# Patient Record
Sex: Female | Born: 1965
Health system: Southern US, Community
[De-identification: ages and names within clinical notes are randomized; demographics above are authoritative.]

## PROBLEM LIST (undated history)

## (undated) DIAGNOSIS — F319 Bipolar disorder, unspecified: Secondary | ICD-10-CM

## (undated) DIAGNOSIS — K219 Gastro-esophageal reflux disease without esophagitis: Secondary | ICD-10-CM

## (undated) DIAGNOSIS — I10 Essential (primary) hypertension: Secondary | ICD-10-CM

## (undated) DIAGNOSIS — F419 Anxiety disorder, unspecified: Secondary | ICD-10-CM

## (undated) DIAGNOSIS — J449 Chronic obstructive pulmonary disease, unspecified: Secondary | ICD-10-CM

---

## 2002-09-11 ENCOUNTER — Inpatient Hospital Stay (HOSPITAL_COMMUNITY): Admission: EM | Admit: 2002-09-11 | Discharge: 2002-09-14 | Payer: Self-pay | Admitting: Psychiatry

## 2015-09-01 ENCOUNTER — Institutional Professional Consult (permissible substitution): Payer: Self-pay | Admitting: Internal Medicine

## 2015-10-15 ENCOUNTER — Institutional Professional Consult (permissible substitution): Payer: Self-pay | Admitting: Internal Medicine

## 2015-10-17 ENCOUNTER — Institutional Professional Consult (permissible substitution): Payer: Self-pay | Admitting: Internal Medicine

## 2016-06-18 DIAGNOSIS — J181 Lobar pneumonia, unspecified organism: Secondary | ICD-10-CM | POA: Diagnosis not present

## 2016-06-18 DIAGNOSIS — J9621 Acute and chronic respiratory failure with hypoxia: Secondary | ICD-10-CM

## 2016-06-18 DIAGNOSIS — M3130 Wegener's granulomatosis without renal involvement: Secondary | ICD-10-CM

## 2016-06-18 DIAGNOSIS — K219 Gastro-esophageal reflux disease without esophagitis: Secondary | ICD-10-CM

## 2016-06-18 DIAGNOSIS — F419 Anxiety disorder, unspecified: Secondary | ICD-10-CM

## 2016-06-18 DIAGNOSIS — I10 Essential (primary) hypertension: Secondary | ICD-10-CM

## 2016-06-19 DIAGNOSIS — J441 Chronic obstructive pulmonary disease with (acute) exacerbation: Secondary | ICD-10-CM

## 2016-06-19 DIAGNOSIS — I10 Essential (primary) hypertension: Secondary | ICD-10-CM | POA: Diagnosis not present

## 2016-06-19 DIAGNOSIS — F419 Anxiety disorder, unspecified: Secondary | ICD-10-CM | POA: Diagnosis not present

## 2016-06-19 DIAGNOSIS — J181 Lobar pneumonia, unspecified organism: Secondary | ICD-10-CM | POA: Diagnosis not present

## 2016-06-19 DIAGNOSIS — J9621 Acute and chronic respiratory failure with hypoxia: Secondary | ICD-10-CM | POA: Diagnosis not present

## 2016-06-20 DIAGNOSIS — F419 Anxiety disorder, unspecified: Secondary | ICD-10-CM

## 2016-06-20 DIAGNOSIS — F250 Schizoaffective disorder, bipolar type: Secondary | ICD-10-CM

## 2016-06-20 DIAGNOSIS — J9621 Acute and chronic respiratory failure with hypoxia: Secondary | ICD-10-CM | POA: Diagnosis not present

## 2016-06-20 DIAGNOSIS — J441 Chronic obstructive pulmonary disease with (acute) exacerbation: Secondary | ICD-10-CM

## 2016-06-20 DIAGNOSIS — K219 Gastro-esophageal reflux disease without esophagitis: Secondary | ICD-10-CM

## 2016-06-20 DIAGNOSIS — I10 Essential (primary) hypertension: Secondary | ICD-10-CM

## 2016-06-20 DIAGNOSIS — M3130 Wegener's granulomatosis without renal involvement: Secondary | ICD-10-CM

## 2016-06-20 DIAGNOSIS — F29 Unspecified psychosis not due to a substance or known physiological condition: Secondary | ICD-10-CM

## 2016-06-20 DIAGNOSIS — J9692 Respiratory failure, unspecified with hypercapnia: Secondary | ICD-10-CM | POA: Diagnosis not present

## 2016-06-20 DIAGNOSIS — J159 Unspecified bacterial pneumonia: Secondary | ICD-10-CM

## 2016-06-21 DIAGNOSIS — J159 Unspecified bacterial pneumonia: Secondary | ICD-10-CM | POA: Diagnosis not present

## 2016-06-21 DIAGNOSIS — J441 Chronic obstructive pulmonary disease with (acute) exacerbation: Secondary | ICD-10-CM | POA: Diagnosis not present

## 2016-06-21 DIAGNOSIS — J9692 Respiratory failure, unspecified with hypercapnia: Secondary | ICD-10-CM | POA: Diagnosis not present

## 2016-06-21 DIAGNOSIS — J9621 Acute and chronic respiratory failure with hypoxia: Secondary | ICD-10-CM | POA: Diagnosis not present

## 2016-06-22 DIAGNOSIS — J441 Chronic obstructive pulmonary disease with (acute) exacerbation: Secondary | ICD-10-CM | POA: Diagnosis not present

## 2016-06-22 DIAGNOSIS — J159 Unspecified bacterial pneumonia: Secondary | ICD-10-CM | POA: Diagnosis not present

## 2016-06-22 DIAGNOSIS — J9621 Acute and chronic respiratory failure with hypoxia: Secondary | ICD-10-CM | POA: Diagnosis not present

## 2016-06-22 DIAGNOSIS — J9692 Respiratory failure, unspecified with hypercapnia: Secondary | ICD-10-CM | POA: Diagnosis not present

## 2016-06-23 DIAGNOSIS — J441 Chronic obstructive pulmonary disease with (acute) exacerbation: Secondary | ICD-10-CM | POA: Diagnosis not present

## 2016-06-23 DIAGNOSIS — J9692 Respiratory failure, unspecified with hypercapnia: Secondary | ICD-10-CM | POA: Diagnosis not present

## 2016-06-23 DIAGNOSIS — J9621 Acute and chronic respiratory failure with hypoxia: Secondary | ICD-10-CM | POA: Diagnosis not present

## 2016-06-23 DIAGNOSIS — J159 Unspecified bacterial pneumonia: Secondary | ICD-10-CM | POA: Diagnosis not present

## 2016-06-24 DIAGNOSIS — J441 Chronic obstructive pulmonary disease with (acute) exacerbation: Secondary | ICD-10-CM | POA: Diagnosis not present

## 2016-06-24 DIAGNOSIS — J9621 Acute and chronic respiratory failure with hypoxia: Secondary | ICD-10-CM | POA: Diagnosis not present

## 2016-06-24 DIAGNOSIS — J159 Unspecified bacterial pneumonia: Secondary | ICD-10-CM | POA: Diagnosis not present

## 2016-06-24 DIAGNOSIS — J9692 Respiratory failure, unspecified with hypercapnia: Secondary | ICD-10-CM | POA: Diagnosis not present

## 2016-06-25 DIAGNOSIS — J159 Unspecified bacterial pneumonia: Secondary | ICD-10-CM | POA: Diagnosis not present

## 2016-06-25 DIAGNOSIS — J441 Chronic obstructive pulmonary disease with (acute) exacerbation: Secondary | ICD-10-CM | POA: Diagnosis not present

## 2016-06-25 DIAGNOSIS — J9692 Respiratory failure, unspecified with hypercapnia: Secondary | ICD-10-CM | POA: Diagnosis not present

## 2016-06-25 DIAGNOSIS — J9621 Acute and chronic respiratory failure with hypoxia: Secondary | ICD-10-CM | POA: Diagnosis not present

## 2016-06-26 DIAGNOSIS — J9692 Respiratory failure, unspecified with hypercapnia: Secondary | ICD-10-CM | POA: Diagnosis not present

## 2016-06-26 DIAGNOSIS — J159 Unspecified bacterial pneumonia: Secondary | ICD-10-CM | POA: Diagnosis not present

## 2016-06-26 DIAGNOSIS — J441 Chronic obstructive pulmonary disease with (acute) exacerbation: Secondary | ICD-10-CM | POA: Diagnosis not present

## 2016-06-26 DIAGNOSIS — J9621 Acute and chronic respiratory failure with hypoxia: Secondary | ICD-10-CM | POA: Diagnosis not present

## 2016-07-16 DIAGNOSIS — J441 Chronic obstructive pulmonary disease with (acute) exacerbation: Secondary | ICD-10-CM | POA: Diagnosis not present

## 2016-07-16 DIAGNOSIS — J189 Pneumonia, unspecified organism: Secondary | ICD-10-CM

## 2016-07-16 DIAGNOSIS — E871 Hypo-osmolality and hyponatremia: Secondary | ICD-10-CM | POA: Diagnosis not present

## 2016-07-16 DIAGNOSIS — I10 Essential (primary) hypertension: Secondary | ICD-10-CM

## 2016-07-16 DIAGNOSIS — J9620 Acute and chronic respiratory failure, unspecified whether with hypoxia or hypercapnia: Secondary | ICD-10-CM

## 2016-07-16 DIAGNOSIS — Z720 Tobacco use: Secondary | ICD-10-CM

## 2016-07-17 DIAGNOSIS — J189 Pneumonia, unspecified organism: Secondary | ICD-10-CM | POA: Diagnosis not present

## 2016-07-17 DIAGNOSIS — J441 Chronic obstructive pulmonary disease with (acute) exacerbation: Secondary | ICD-10-CM | POA: Diagnosis not present

## 2016-07-17 DIAGNOSIS — E871 Hypo-osmolality and hyponatremia: Secondary | ICD-10-CM | POA: Diagnosis not present

## 2016-07-17 DIAGNOSIS — J9620 Acute and chronic respiratory failure, unspecified whether with hypoxia or hypercapnia: Secondary | ICD-10-CM | POA: Diagnosis not present

## 2016-07-18 DIAGNOSIS — J189 Pneumonia, unspecified organism: Secondary | ICD-10-CM | POA: Diagnosis not present

## 2016-07-18 DIAGNOSIS — E871 Hypo-osmolality and hyponatremia: Secondary | ICD-10-CM | POA: Diagnosis not present

## 2016-07-18 DIAGNOSIS — J9620 Acute and chronic respiratory failure, unspecified whether with hypoxia or hypercapnia: Secondary | ICD-10-CM | POA: Diagnosis not present

## 2016-07-18 DIAGNOSIS — J441 Chronic obstructive pulmonary disease with (acute) exacerbation: Secondary | ICD-10-CM | POA: Diagnosis not present

## 2016-07-19 DIAGNOSIS — J189 Pneumonia, unspecified organism: Secondary | ICD-10-CM | POA: Diagnosis not present

## 2016-07-19 DIAGNOSIS — E871 Hypo-osmolality and hyponatremia: Secondary | ICD-10-CM | POA: Diagnosis not present

## 2016-07-19 DIAGNOSIS — J441 Chronic obstructive pulmonary disease with (acute) exacerbation: Secondary | ICD-10-CM | POA: Diagnosis not present

## 2016-07-19 DIAGNOSIS — J9620 Acute and chronic respiratory failure, unspecified whether with hypoxia or hypercapnia: Secondary | ICD-10-CM | POA: Diagnosis not present

## 2016-07-20 DIAGNOSIS — J441 Chronic obstructive pulmonary disease with (acute) exacerbation: Secondary | ICD-10-CM | POA: Diagnosis not present

## 2016-07-20 DIAGNOSIS — J189 Pneumonia, unspecified organism: Secondary | ICD-10-CM | POA: Diagnosis not present

## 2016-07-20 DIAGNOSIS — E871 Hypo-osmolality and hyponatremia: Secondary | ICD-10-CM | POA: Diagnosis not present

## 2016-07-20 DIAGNOSIS — J9620 Acute and chronic respiratory failure, unspecified whether with hypoxia or hypercapnia: Secondary | ICD-10-CM | POA: Diagnosis not present

## 2016-07-21 DIAGNOSIS — J9620 Acute and chronic respiratory failure, unspecified whether with hypoxia or hypercapnia: Secondary | ICD-10-CM | POA: Diagnosis not present

## 2016-07-21 DIAGNOSIS — I10 Essential (primary) hypertension: Secondary | ICD-10-CM

## 2016-07-21 DIAGNOSIS — J441 Chronic obstructive pulmonary disease with (acute) exacerbation: Secondary | ICD-10-CM | POA: Diagnosis not present

## 2016-07-21 DIAGNOSIS — J189 Pneumonia, unspecified organism: Secondary | ICD-10-CM

## 2016-07-21 DIAGNOSIS — Z720 Tobacco use: Secondary | ICD-10-CM

## 2016-07-21 DIAGNOSIS — E871 Hypo-osmolality and hyponatremia: Secondary | ICD-10-CM

## 2016-07-22 DIAGNOSIS — J9620 Acute and chronic respiratory failure, unspecified whether with hypoxia or hypercapnia: Secondary | ICD-10-CM | POA: Diagnosis not present

## 2016-07-22 DIAGNOSIS — J441 Chronic obstructive pulmonary disease with (acute) exacerbation: Secondary | ICD-10-CM | POA: Diagnosis not present

## 2016-07-22 DIAGNOSIS — E871 Hypo-osmolality and hyponatremia: Secondary | ICD-10-CM | POA: Diagnosis not present

## 2016-07-22 DIAGNOSIS — J189 Pneumonia, unspecified organism: Secondary | ICD-10-CM | POA: Diagnosis not present

## 2016-07-23 DIAGNOSIS — J9620 Acute and chronic respiratory failure, unspecified whether with hypoxia or hypercapnia: Secondary | ICD-10-CM | POA: Diagnosis not present

## 2016-07-23 DIAGNOSIS — E871 Hypo-osmolality and hyponatremia: Secondary | ICD-10-CM | POA: Diagnosis not present

## 2016-07-23 DIAGNOSIS — J189 Pneumonia, unspecified organism: Secondary | ICD-10-CM | POA: Diagnosis not present

## 2016-07-23 DIAGNOSIS — J441 Chronic obstructive pulmonary disease with (acute) exacerbation: Secondary | ICD-10-CM | POA: Diagnosis not present

## 2016-07-24 DIAGNOSIS — J9620 Acute and chronic respiratory failure, unspecified whether with hypoxia or hypercapnia: Secondary | ICD-10-CM | POA: Diagnosis not present

## 2016-07-24 DIAGNOSIS — J441 Chronic obstructive pulmonary disease with (acute) exacerbation: Secondary | ICD-10-CM | POA: Diagnosis not present

## 2016-07-24 DIAGNOSIS — E871 Hypo-osmolality and hyponatremia: Secondary | ICD-10-CM | POA: Diagnosis not present

## 2016-07-24 DIAGNOSIS — J189 Pneumonia, unspecified organism: Secondary | ICD-10-CM | POA: Diagnosis not present

## 2016-08-09 DIAGNOSIS — J441 Chronic obstructive pulmonary disease with (acute) exacerbation: Secondary | ICD-10-CM

## 2016-08-09 DIAGNOSIS — M3130 Wegener's granulomatosis without renal involvement: Secondary | ICD-10-CM

## 2016-08-09 DIAGNOSIS — F419 Anxiety disorder, unspecified: Secondary | ICD-10-CM

## 2016-08-09 DIAGNOSIS — E871 Hypo-osmolality and hyponatremia: Secondary | ICD-10-CM

## 2016-08-09 DIAGNOSIS — I160 Hypertensive urgency: Secondary | ICD-10-CM

## 2016-08-09 DIAGNOSIS — Z720 Tobacco use: Secondary | ICD-10-CM

## 2016-08-09 DIAGNOSIS — J9620 Acute and chronic respiratory failure, unspecified whether with hypoxia or hypercapnia: Secondary | ICD-10-CM

## 2016-08-10 DIAGNOSIS — J9620 Acute and chronic respiratory failure, unspecified whether with hypoxia or hypercapnia: Secondary | ICD-10-CM | POA: Diagnosis not present

## 2016-08-10 DIAGNOSIS — I160 Hypertensive urgency: Secondary | ICD-10-CM | POA: Diagnosis not present

## 2016-08-10 DIAGNOSIS — E871 Hypo-osmolality and hyponatremia: Secondary | ICD-10-CM | POA: Diagnosis not present

## 2016-08-10 DIAGNOSIS — J441 Chronic obstructive pulmonary disease with (acute) exacerbation: Secondary | ICD-10-CM | POA: Diagnosis not present

## 2016-08-11 DIAGNOSIS — J441 Chronic obstructive pulmonary disease with (acute) exacerbation: Secondary | ICD-10-CM

## 2016-08-11 DIAGNOSIS — E871 Hypo-osmolality and hyponatremia: Secondary | ICD-10-CM | POA: Diagnosis not present

## 2016-08-11 DIAGNOSIS — J9621 Acute and chronic respiratory failure with hypoxia: Secondary | ICD-10-CM

## 2016-08-11 DIAGNOSIS — F250 Schizoaffective disorder, bipolar type: Secondary | ICD-10-CM

## 2016-08-11 DIAGNOSIS — Z720 Tobacco use: Secondary | ICD-10-CM

## 2016-08-11 DIAGNOSIS — J9612 Chronic respiratory failure with hypercapnia: Secondary | ICD-10-CM | POA: Diagnosis not present

## 2016-08-11 DIAGNOSIS — G40909 Epilepsy, unspecified, not intractable, without status epilepticus: Secondary | ICD-10-CM | POA: Diagnosis not present

## 2016-08-11 DIAGNOSIS — K219 Gastro-esophageal reflux disease without esophagitis: Secondary | ICD-10-CM

## 2016-08-11 DIAGNOSIS — I160 Hypertensive urgency: Secondary | ICD-10-CM

## 2016-08-11 DIAGNOSIS — E872 Acidosis: Secondary | ICD-10-CM

## 2016-08-11 DIAGNOSIS — M549 Dorsalgia, unspecified: Secondary | ICD-10-CM

## 2016-08-12 DIAGNOSIS — J9612 Chronic respiratory failure with hypercapnia: Secondary | ICD-10-CM | POA: Diagnosis not present

## 2016-08-12 DIAGNOSIS — E872 Acidosis: Secondary | ICD-10-CM | POA: Diagnosis not present

## 2016-08-12 DIAGNOSIS — G40909 Epilepsy, unspecified, not intractable, without status epilepticus: Secondary | ICD-10-CM | POA: Diagnosis not present

## 2016-08-12 DIAGNOSIS — E871 Hypo-osmolality and hyponatremia: Secondary | ICD-10-CM | POA: Diagnosis not present

## 2016-08-13 DIAGNOSIS — G40909 Epilepsy, unspecified, not intractable, without status epilepticus: Secondary | ICD-10-CM | POA: Diagnosis not present

## 2016-08-13 DIAGNOSIS — J9612 Chronic respiratory failure with hypercapnia: Secondary | ICD-10-CM | POA: Diagnosis not present

## 2016-08-13 DIAGNOSIS — E871 Hypo-osmolality and hyponatremia: Secondary | ICD-10-CM | POA: Diagnosis not present

## 2016-08-13 DIAGNOSIS — E872 Acidosis: Secondary | ICD-10-CM | POA: Diagnosis not present

## 2016-08-22 DIAGNOSIS — F419 Anxiety disorder, unspecified: Secondary | ICD-10-CM

## 2016-08-22 DIAGNOSIS — R4182 Altered mental status, unspecified: Secondary | ICD-10-CM

## 2016-08-22 DIAGNOSIS — J9602 Acute respiratory failure with hypercapnia: Secondary | ICD-10-CM

## 2016-08-22 DIAGNOSIS — J441 Chronic obstructive pulmonary disease with (acute) exacerbation: Secondary | ICD-10-CM | POA: Diagnosis not present

## 2016-08-22 DIAGNOSIS — J9620 Acute and chronic respiratory failure, unspecified whether with hypoxia or hypercapnia: Secondary | ICD-10-CM

## 2016-08-22 DIAGNOSIS — I10 Essential (primary) hypertension: Secondary | ICD-10-CM

## 2016-08-23 DIAGNOSIS — J441 Chronic obstructive pulmonary disease with (acute) exacerbation: Secondary | ICD-10-CM | POA: Diagnosis not present

## 2016-08-23 DIAGNOSIS — J9602 Acute respiratory failure with hypercapnia: Secondary | ICD-10-CM | POA: Diagnosis not present

## 2016-08-23 DIAGNOSIS — R4182 Altered mental status, unspecified: Secondary | ICD-10-CM | POA: Diagnosis not present

## 2016-08-23 DIAGNOSIS — J9620 Acute and chronic respiratory failure, unspecified whether with hypoxia or hypercapnia: Secondary | ICD-10-CM | POA: Diagnosis not present

## 2016-08-24 DIAGNOSIS — R4182 Altered mental status, unspecified: Secondary | ICD-10-CM

## 2016-08-24 DIAGNOSIS — I10 Essential (primary) hypertension: Secondary | ICD-10-CM

## 2016-08-24 DIAGNOSIS — J9620 Acute and chronic respiratory failure, unspecified whether with hypoxia or hypercapnia: Secondary | ICD-10-CM | POA: Diagnosis not present

## 2016-08-24 DIAGNOSIS — F419 Anxiety disorder, unspecified: Secondary | ICD-10-CM

## 2016-08-24 DIAGNOSIS — J9602 Acute respiratory failure with hypercapnia: Secondary | ICD-10-CM

## 2016-08-24 DIAGNOSIS — J441 Chronic obstructive pulmonary disease with (acute) exacerbation: Secondary | ICD-10-CM

## 2016-08-25 DIAGNOSIS — J441 Chronic obstructive pulmonary disease with (acute) exacerbation: Secondary | ICD-10-CM | POA: Diagnosis not present

## 2016-08-25 DIAGNOSIS — J9620 Acute and chronic respiratory failure, unspecified whether with hypoxia or hypercapnia: Secondary | ICD-10-CM | POA: Diagnosis not present

## 2016-08-25 DIAGNOSIS — J9602 Acute respiratory failure with hypercapnia: Secondary | ICD-10-CM | POA: Diagnosis not present

## 2016-08-25 DIAGNOSIS — R4182 Altered mental status, unspecified: Secondary | ICD-10-CM | POA: Diagnosis not present

## 2016-08-26 ENCOUNTER — Inpatient Hospital Stay (HOSPITAL_COMMUNITY): Payer: Medicare HMO

## 2016-08-26 ENCOUNTER — Inpatient Hospital Stay (HOSPITAL_COMMUNITY)
Admission: AD | Admit: 2016-08-26 | Discharge: 2016-09-09 | DRG: 166 | Disposition: A | Discharge status: Rehabilitation Facility | Payer: Medicare HMO | Source: Other Acute Inpatient Hospital | Attending: Family Medicine | Admitting: Family Medicine

## 2016-08-26 ENCOUNTER — Encounter (HOSPITAL_COMMUNITY): Payer: Self-pay | Admitting: Student

## 2016-08-26 DIAGNOSIS — F319 Bipolar disorder, unspecified: Secondary | ICD-10-CM | POA: Diagnosis present

## 2016-08-26 DIAGNOSIS — J9600 Acute respiratory failure, unspecified whether with hypoxia or hypercapnia: Secondary | ICD-10-CM | POA: Diagnosis not present

## 2016-08-26 DIAGNOSIS — Z6835 Body mass index (BMI) 35.0-35.9, adult: Secondary | ICD-10-CM | POA: Diagnosis not present

## 2016-08-26 DIAGNOSIS — F4310 Post-traumatic stress disorder, unspecified: Secondary | ICD-10-CM | POA: Diagnosis present

## 2016-08-26 DIAGNOSIS — G7281 Critical illness myopathy: Secondary | ICD-10-CM | POA: Diagnosis present

## 2016-08-26 DIAGNOSIS — D649 Anemia, unspecified: Secondary | ICD-10-CM | POA: Diagnosis not present

## 2016-08-26 DIAGNOSIS — Z791 Long term (current) use of non-steroidal anti-inflammatories (NSAID): Secondary | ICD-10-CM | POA: Diagnosis not present

## 2016-08-26 DIAGNOSIS — E1169 Type 2 diabetes mellitus with other specified complication: Secondary | ICD-10-CM | POA: Diagnosis not present

## 2016-08-26 DIAGNOSIS — Z888 Allergy status to other drugs, medicaments and biological substances status: Secondary | ICD-10-CM

## 2016-08-26 DIAGNOSIS — D62 Acute posthemorrhagic anemia: Secondary | ICD-10-CM | POA: Diagnosis not present

## 2016-08-26 DIAGNOSIS — R0600 Dyspnea, unspecified: Secondary | ICD-10-CM

## 2016-08-26 DIAGNOSIS — J129 Viral pneumonia, unspecified: Secondary | ICD-10-CM | POA: Diagnosis present

## 2016-08-26 DIAGNOSIS — J440 Chronic obstructive pulmonary disease with acute lower respiratory infection: Secondary | ICD-10-CM | POA: Diagnosis present

## 2016-08-26 DIAGNOSIS — R32 Unspecified urinary incontinence: Secondary | ICD-10-CM | POA: Diagnosis not present

## 2016-08-26 DIAGNOSIS — J9620 Acute and chronic respiratory failure, unspecified whether with hypoxia or hypercapnia: Secondary | ICD-10-CM | POA: Diagnosis not present

## 2016-08-26 DIAGNOSIS — Z880 Allergy status to penicillin: Secondary | ICD-10-CM

## 2016-08-26 DIAGNOSIS — Z79891 Long term (current) use of opiate analgesic: Secondary | ICD-10-CM

## 2016-08-26 DIAGNOSIS — J189 Pneumonia, unspecified organism: Secondary | ICD-10-CM

## 2016-08-26 DIAGNOSIS — K219 Gastro-esophageal reflux disease without esophagitis: Secondary | ICD-10-CM | POA: Diagnosis not present

## 2016-08-26 DIAGNOSIS — Z4659 Encounter for fitting and adjustment of other gastrointestinal appliance and device: Secondary | ICD-10-CM | POA: Diagnosis not present

## 2016-08-26 DIAGNOSIS — E119 Type 2 diabetes mellitus without complications: Secondary | ICD-10-CM

## 2016-08-26 DIAGNOSIS — F3112 Bipolar disorder, current episode manic without psychotic features, moderate: Secondary | ICD-10-CM | POA: Diagnosis not present

## 2016-08-26 DIAGNOSIS — R5381 Other malaise: Secondary | ICD-10-CM | POA: Diagnosis not present

## 2016-08-26 DIAGNOSIS — I5033 Acute on chronic diastolic (congestive) heart failure: Secondary | ICD-10-CM | POA: Diagnosis not present

## 2016-08-26 DIAGNOSIS — K5903 Drug induced constipation: Secondary | ICD-10-CM | POA: Diagnosis not present

## 2016-08-26 DIAGNOSIS — I110 Hypertensive heart disease with heart failure: Secondary | ICD-10-CM | POA: Diagnosis present

## 2016-08-26 DIAGNOSIS — R0602 Shortness of breath: Secondary | ICD-10-CM

## 2016-08-26 DIAGNOSIS — E876 Hypokalemia: Secondary | ICD-10-CM | POA: Diagnosis not present

## 2016-08-26 DIAGNOSIS — I10 Essential (primary) hypertension: Secondary | ICD-10-CM | POA: Diagnosis not present

## 2016-08-26 DIAGNOSIS — Z881 Allergy status to other antibiotic agents status: Secondary | ICD-10-CM

## 2016-08-26 DIAGNOSIS — M549 Dorsalgia, unspecified: Secondary | ICD-10-CM | POA: Diagnosis present

## 2016-08-26 DIAGNOSIS — E1142 Type 2 diabetes mellitus with diabetic polyneuropathy: Secondary | ICD-10-CM | POA: Diagnosis not present

## 2016-08-26 DIAGNOSIS — Z9911 Dependence on respirator [ventilator] status: Secondary | ICD-10-CM

## 2016-08-26 DIAGNOSIS — I5030 Unspecified diastolic (congestive) heart failure: Secondary | ICD-10-CM | POA: Diagnosis present

## 2016-08-26 DIAGNOSIS — E1165 Type 2 diabetes mellitus with hyperglycemia: Secondary | ICD-10-CM | POA: Diagnosis present

## 2016-08-26 DIAGNOSIS — J810 Acute pulmonary edema: Secondary | ICD-10-CM | POA: Diagnosis not present

## 2016-08-26 DIAGNOSIS — J9601 Acute respiratory failure with hypoxia: Secondary | ICD-10-CM | POA: Diagnosis not present

## 2016-08-26 DIAGNOSIS — Z885 Allergy status to narcotic agent status: Secondary | ICD-10-CM

## 2016-08-26 DIAGNOSIS — J441 Chronic obstructive pulmonary disease with (acute) exacerbation: Secondary | ICD-10-CM | POA: Diagnosis present

## 2016-08-26 DIAGNOSIS — Z7951 Long term (current) use of inhaled steroids: Secondary | ICD-10-CM

## 2016-08-26 DIAGNOSIS — R918 Other nonspecific abnormal finding of lung field: Secondary | ICD-10-CM | POA: Diagnosis not present

## 2016-08-26 DIAGNOSIS — R7309 Other abnormal glucose: Secondary | ICD-10-CM | POA: Diagnosis not present

## 2016-08-26 DIAGNOSIS — F3170 Bipolar disorder, currently in remission, most recent episode unspecified: Secondary | ICD-10-CM | POA: Diagnosis not present

## 2016-08-26 DIAGNOSIS — D71 Functional disorders of polymorphonuclear neutrophils: Secondary | ICD-10-CM | POA: Diagnosis present

## 2016-08-26 DIAGNOSIS — E662 Morbid (severe) obesity with alveolar hypoventilation: Secondary | ICD-10-CM | POA: Diagnosis not present

## 2016-08-26 DIAGNOSIS — J449 Chronic obstructive pulmonary disease, unspecified: Secondary | ICD-10-CM | POA: Diagnosis not present

## 2016-08-26 DIAGNOSIS — J9621 Acute and chronic respiratory failure with hypoxia: Secondary | ICD-10-CM | POA: Diagnosis present

## 2016-08-26 DIAGNOSIS — B49 Unspecified mycosis: Secondary | ICD-10-CM | POA: Diagnosis not present

## 2016-08-26 DIAGNOSIS — F419 Anxiety disorder, unspecified: Secondary | ICD-10-CM | POA: Diagnosis not present

## 2016-08-26 DIAGNOSIS — J9602 Acute respiratory failure with hypercapnia: Secondary | ICD-10-CM | POA: Diagnosis not present

## 2016-08-26 DIAGNOSIS — G4733 Obstructive sleep apnea (adult) (pediatric): Secondary | ICD-10-CM | POA: Diagnosis not present

## 2016-08-26 DIAGNOSIS — G8929 Other chronic pain: Secondary | ICD-10-CM | POA: Diagnosis not present

## 2016-08-26 DIAGNOSIS — Z978 Presence of other specified devices: Secondary | ICD-10-CM

## 2016-08-26 DIAGNOSIS — E871 Hypo-osmolality and hyponatremia: Secondary | ICD-10-CM | POA: Diagnosis not present

## 2016-08-26 DIAGNOSIS — J9622 Acute and chronic respiratory failure with hypercapnia: Secondary | ICD-10-CM | POA: Diagnosis not present

## 2016-08-26 DIAGNOSIS — R4182 Altered mental status, unspecified: Secondary | ICD-10-CM | POA: Diagnosis not present

## 2016-08-26 DIAGNOSIS — E669 Obesity, unspecified: Secondary | ICD-10-CM | POA: Diagnosis not present

## 2016-08-26 DIAGNOSIS — J181 Lobar pneumonia, unspecified organism: Secondary | ICD-10-CM | POA: Diagnosis not present

## 2016-08-26 DIAGNOSIS — R262 Difficulty in walking, not elsewhere classified: Secondary | ICD-10-CM | POA: Diagnosis not present

## 2016-08-26 HISTORY — DX: Gastro-esophageal reflux disease without esophagitis: K21.9

## 2016-08-26 HISTORY — DX: Chronic obstructive pulmonary disease, unspecified: J44.9

## 2016-08-26 HISTORY — DX: Bipolar disorder, unspecified: F31.9

## 2016-08-26 HISTORY — DX: Essential (primary) hypertension: I10

## 2016-08-26 HISTORY — DX: Anxiety disorder, unspecified: F41.9

## 2016-08-26 LAB — URINALYSIS, ROUTINE W REFLEX MICROSCOPIC
Bilirubin Urine: NEGATIVE
Glucose, UA: NEGATIVE mg/dL
Ketones, ur: 15 mg/dL — AB
NITRITE: NEGATIVE
PH: 6.5 (ref 5.0–8.0)
Protein, ur: 100 mg/dL — AB
SPECIFIC GRAVITY, URINE: 1.026 (ref 1.005–1.030)

## 2016-08-26 LAB — BLOOD GAS, ARTERIAL
Acid-Base Excess: 15.6 mmol/L — ABNORMAL HIGH (ref 0.0–2.0)
Bicarbonate: 41.1 mmol/L — ABNORMAL HIGH (ref 20.0–28.0)
DELIVERY SYSTEMS: POSITIVE
Drawn by: 418751
Expiratory PAP: 7
FIO2: 40
LHR: 10 {breaths}/min
MODE: POSITIVE
O2 Saturation: 96.4 %
PO2 ART: 87.4 mmHg (ref 83.0–108.0)
Patient temperature: 98.6
pCO2 arterial: 65.6 mmHg (ref 32.0–48.0)
pH, Arterial: 7.413 (ref 7.350–7.450)

## 2016-08-26 LAB — CBC WITH DIFFERENTIAL/PLATELET
BASOS PCT: 0 %
Basophils Absolute: 0 10*3/uL (ref 0.0–0.1)
Eosinophils Absolute: 0 10*3/uL (ref 0.0–0.7)
Eosinophils Relative: 0 %
HEMATOCRIT: 39.8 % (ref 36.0–46.0)
HEMOGLOBIN: 12.1 g/dL (ref 12.0–15.0)
LYMPHS PCT: 4 %
Lymphs Abs: 0.4 10*3/uL — ABNORMAL LOW (ref 0.7–4.0)
MCH: 25.2 pg — ABNORMAL LOW (ref 26.0–34.0)
MCHC: 30.4 g/dL (ref 30.0–36.0)
MCV: 82.9 fL (ref 78.0–100.0)
MONOS PCT: 5 %
Monocytes Absolute: 0.5 10*3/uL (ref 0.1–1.0)
NEUTROS ABS: 8.8 10*3/uL — AB (ref 1.7–7.7)
NEUTROS PCT: 91 %
Platelets: 236 10*3/uL (ref 150–400)
RBC: 4.8 MIL/uL (ref 3.87–5.11)
RDW: 15.5 % (ref 11.5–15.5)
WBC: 9.7 10*3/uL (ref 4.0–10.5)

## 2016-08-26 LAB — COMPREHENSIVE METABOLIC PANEL
ALK PHOS: 45 U/L (ref 38–126)
ALT: 14 U/L (ref 14–54)
ANION GAP: 14 (ref 5–15)
AST: 12 U/L — ABNORMAL LOW (ref 15–41)
Albumin: 3.5 g/dL (ref 3.5–5.0)
BILIRUBIN TOTAL: 0.9 mg/dL (ref 0.3–1.2)
BUN: 13 mg/dL (ref 6–20)
CALCIUM: 9.2 mg/dL (ref 8.9–10.3)
CO2: 36 mmol/L — ABNORMAL HIGH (ref 22–32)
Chloride: 90 mmol/L — ABNORMAL LOW (ref 101–111)
Creatinine, Ser: 0.42 mg/dL — ABNORMAL LOW (ref 0.44–1.00)
GLUCOSE: 184 mg/dL — AB (ref 65–99)
Potassium: 3.3 mmol/L — ABNORMAL LOW (ref 3.5–5.1)
Sodium: 140 mmol/L (ref 135–145)
TOTAL PROTEIN: 6.1 g/dL — AB (ref 6.5–8.1)

## 2016-08-26 LAB — TROPONIN I
Troponin I: 0.04 ng/mL (ref <0.03)
Troponin I: <0.03 ng/mL (ref <0.03)

## 2016-08-26 LAB — PHOSPHORUS: PHOSPHORUS: 3.9 mg/dL (ref 2.5–4.6)

## 2016-08-26 LAB — BRAIN NATRIURETIC PEPTIDE: B Natriuretic Peptide: 168.8 pg/mL — ABNORMAL HIGH (ref 0.0–100.0)

## 2016-08-26 LAB — LACTIC ACID, PLASMA
LACTIC ACID, VENOUS: 0.9 mmol/L (ref 0.5–1.9)
Lactic Acid, Venous: 0.8 mmol/L (ref 0.5–1.9)

## 2016-08-26 LAB — MRSA PCR SCREENING: MRSA BY PCR: NEGATIVE

## 2016-08-26 LAB — URINE MICROSCOPIC-ADD ON

## 2016-08-26 LAB — PROCALCITONIN

## 2016-08-26 LAB — VALPROIC ACID LEVEL: Valproic Acid Lvl: 75 ug/mL (ref 50.0–100.0)

## 2016-08-26 LAB — MAGNESIUM: MAGNESIUM: 2.3 mg/dL (ref 1.7–2.4)

## 2016-08-26 MED ORDER — LORAZEPAM 2 MG/ML IJ SOLN: 0.5000 mg | Freq: Three times a day (TID) | INTRAMUSCULAR | Status: DC | PRN

## 2016-08-26 MED ORDER — VALPROATE SODIUM 500 MG/5ML IV SOLN
500.0000 mg | Freq: Two times a day (BID) | INTRAVENOUS | Status: DC
  Administered 2016-08-26 – 2016-08-27 (×3): 500 mg via INTRAVENOUS
  Filled 2016-08-26 (×5): qty 5

## 2016-08-26 MED ORDER — SODIUM CHLORIDE 0.9 % IV SOLN
30.0000 meq | Freq: Two times a day (BID) | INTRAVENOUS | Status: DC
  Administered 2016-08-26: 30 meq via INTRAVENOUS
  Filled 2016-08-26 (×2): qty 15

## 2016-08-26 MED ORDER — ETOMIDATE 2 MG/ML IV SOLN
0.3000 mg/kg | Freq: Once | INTRAVENOUS | Status: AC
  Administered 2016-08-26: 20 mg via INTRAVENOUS

## 2016-08-26 MED ORDER — METHYLPREDNISOLONE SODIUM SUCC 125 MG IJ SOLR
60.0000 mg | Freq: Four times a day (QID) | INTRAMUSCULAR | Status: DC
  Administered 2016-08-26 – 2016-08-28 (×7): 60 mg via INTRAVENOUS
  Filled 2016-08-26: qty 0.96
  Filled 2016-08-26: qty 2
  Filled 2016-08-26 (×2): qty 0.96
  Filled 2016-08-26: qty 2
  Filled 2016-08-26 (×3): qty 0.96

## 2016-08-26 MED ORDER — FENTANYL CITRATE (PF) 100 MCG/2ML IJ SOLN: 100.0000 ug | Freq: Once | INTRAMUSCULAR | Status: DC

## 2016-08-26 MED ORDER — RISPERIDONE 2 MG PO TABS
2.0000 mg | ORAL_TABLET | Freq: Every day | ORAL | Status: DC
  Filled 2016-08-26: qty 1

## 2016-08-26 MED ORDER — ENOXAPARIN SODIUM 40 MG/0.4ML ~~LOC~~ SOLN
40.0000 mg | SUBCUTANEOUS | Status: DC
  Administered 2016-08-26 – 2016-09-05 (×10): 40 mg via SUBCUTANEOUS
  Filled 2016-08-26 (×11): qty 0.4

## 2016-08-26 MED ORDER — ORAL CARE MOUTH RINSE: 15.0000 mL | Freq: Two times a day (BID) | OROMUCOSAL | Status: DC

## 2016-08-26 MED ORDER — MIDAZOLAM HCL 2 MG/2ML IJ SOLN
INTRAMUSCULAR | Status: AC
  Administered 2016-08-26: 2 mg via INTRAVENOUS
  Filled 2016-08-26: qty 4

## 2016-08-26 MED ORDER — MORPHINE SULFATE (PF) 4 MG/ML IV SOLN
4.0000 mg | INTRAVENOUS | Status: AC
  Administered 2016-08-26: 4 mg via INTRAVENOUS
  Filled 2016-08-26: qty 1

## 2016-08-26 MED ORDER — ALBUTEROL SULFATE (2.5 MG/3ML) 0.083% IN NEBU
2.5000 mg | INHALATION_SOLUTION | RESPIRATORY_TRACT | Status: DC | PRN
Start: 2016-08-26 — End: 2016-09-09

## 2016-08-26 MED ORDER — SODIUM CHLORIDE 0.9 % IV SOLN
250.0000 mL | INTRAVENOUS | Status: DC | PRN
  Administered 2016-08-29: 250 mL via INTRAVENOUS

## 2016-08-26 MED ORDER — ACETAMINOPHEN 325 MG PO TABS
650.0000 mg | ORAL_TABLET | Freq: Four times a day (QID) | ORAL | Status: DC | PRN
  Filled 2016-08-26: qty 2

## 2016-08-26 MED ORDER — FENTANYL CITRATE (PF) 100 MCG/2ML IJ SOLN: 100.0000 ug | INTRAMUSCULAR | Status: DC | PRN

## 2016-08-26 MED ORDER — PROPOFOL 1000 MG/100ML IV EMUL
0.0000 ug/kg/min | INTRAVENOUS | Status: DC
  Administered 2016-08-27: 25 ug/kg/min via INTRAVENOUS
  Administered 2016-08-27 (×3): 40 ug/kg/min via INTRAVENOUS
  Administered 2016-08-27: 5 ug/kg/min via INTRAVENOUS
  Administered 2016-08-28 (×2): 50 ug/kg/min via INTRAVENOUS
  Administered 2016-08-28 (×2): 40 ug/kg/min via INTRAVENOUS
  Administered 2016-08-28 (×2): 50 ug/kg/min via INTRAVENOUS
  Administered 2016-08-29 (×2): 30 ug/kg/min via INTRAVENOUS
  Administered 2016-08-29: 40 ug/kg/min via INTRAVENOUS
  Administered 2016-08-29: 50 ug/kg/min via INTRAVENOUS
  Administered 2016-08-30 (×3): 40 ug/kg/min via INTRAVENOUS
  Administered 2016-08-30: 50 ug/kg/min via INTRAVENOUS
  Administered 2016-08-31: 15 ug/kg/min via INTRAVENOUS
  Administered 2016-08-31: 10 ug/kg/min via INTRAVENOUS
  Administered 2016-08-31 – 2016-09-01 (×2): 25 ug/kg/min via INTRAVENOUS
  Filled 2016-08-26 (×24): qty 100

## 2016-08-26 MED ORDER — IPRATROPIUM-ALBUTEROL 0.5-2.5 (3) MG/3ML IN SOLN
3.0000 mL | Freq: Four times a day (QID) | RESPIRATORY_TRACT | Status: DC
  Administered 2016-08-26 – 2016-09-05 (×41): 3 mL via RESPIRATORY_TRACT
  Filled 2016-08-26 (×41): qty 3

## 2016-08-26 MED ORDER — MIDAZOLAM HCL 2 MG/2ML IJ SOLN
2.0000 mg | Freq: Once | INTRAMUSCULAR | Status: AC
  Administered 2016-08-26: 2 mg via INTRAVENOUS

## 2016-08-26 MED ORDER — METOPROLOL SUCCINATE ER 50 MG PO TB24
50.0000 mg | ORAL_TABLET | Freq: Every day | ORAL | Status: DC
  Filled 2016-08-26: qty 1

## 2016-08-26 MED ORDER — MOMETASONE FURO-FORMOTEROL FUM 200-5 MCG/ACT IN AERO
2.0000 | INHALATION_SPRAY | Freq: Two times a day (BID) | RESPIRATORY_TRACT | Status: DC
  Filled 2016-08-26: qty 8.8

## 2016-08-26 MED ORDER — LORAZEPAM 0.5 MG PO TABS: 0.5000 mg | ORAL_TABLET | Freq: Three times a day (TID) | ORAL | Status: DC | PRN

## 2016-08-26 MED ORDER — FENTANYL CITRATE (PF) 100 MCG/2ML IJ SOLN
INTRAMUSCULAR | Status: AC
  Administered 2016-08-26: 100 ug
  Filled 2016-08-26: qty 4

## 2016-08-26 MED ORDER — VALPROIC ACID 250 MG PO CAPS
500.0000 mg | ORAL_CAPSULE | Freq: Two times a day (BID) | ORAL | Status: DC
  Filled 2016-08-26: qty 2

## 2016-08-26 MED ORDER — ROCURONIUM BROMIDE 50 MG/5ML IV SOLN
1.0000 mg/kg | Freq: Once | INTRAVENOUS | Status: AC
  Administered 2016-08-26: 50 mg via INTRAVENOUS

## 2016-08-26 MED ORDER — MIRTAZAPINE 30 MG PO TABS
30.0000 mg | ORAL_TABLET | Freq: Every day | ORAL | Status: DC
  Filled 2016-08-26: qty 1

## 2016-08-26 MED ORDER — SODIUM CHLORIDE 0.9 % IV SOLN
INTRAVENOUS | Status: DC
  Administered 2016-08-26 – 2016-08-27 (×3): via INTRAVENOUS

## 2016-08-26 MED ORDER — VALPROATE SODIUM 500 MG/5ML IV SOLN
500.0000 mg | Freq: Two times a day (BID) | INTRAVENOUS | Status: DC
  Filled 2016-08-26: qty 5

## 2016-08-26 MED ORDER — FAMOTIDINE IN NACL 20-0.9 MG/50ML-% IV SOLN
20.0000 mg | Freq: Two times a day (BID) | INTRAVENOUS | Status: DC
  Administered 2016-08-26 – 2016-08-28 (×4): 20 mg via INTRAVENOUS
  Filled 2016-08-26 (×5): qty 50

## 2016-08-26 MED ORDER — CHLORHEXIDINE GLUCONATE 0.12 % MT SOLN
15.0000 mL | Freq: Two times a day (BID) | OROMUCOSAL | Status: DC
Start: 2016-08-26 — End: 2016-08-27

## 2016-08-26 MED ORDER — METOPROLOL TARTRATE 5 MG/5ML IV SOLN
5.0000 mg | Freq: Four times a day (QID) | INTRAVENOUS | Status: DC
  Administered 2016-08-27 – 2016-08-28 (×5): 5 mg via INTRAVENOUS
  Filled 2016-08-26 (×5): qty 5

## 2016-08-26 NOTE — H&P (Signed)
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  On 08/22/2016, patient presented to [facility] with dyspnea, dry cough and subjective fever and hypoxia to 70's. Initial ABG was 7.4/66/45/41 [facility]. CBC significant for mild leukocytosis. BMP remarkable for hypoosmolar hyponatremia to 128 otherwise within normal limits. UA was remarkable for pyuria but urine culture was negative. She was started on Solu-Medrol, DuoNeb, Pulmicort and guanfacine-DM for COPD exacerbation. However, she continued to have worsening dyspnea with increased oxygen requirement on BiPAP continuously. She was also started on cefepime and vancomycin on 08/24/2016.  On the day of transfer, vitals and was normal limits except for tachypnea to 25, ABG was 7.4/65/75/41/ Last BMP within normal limits. Mild leukocytosis and hyponatremia resolved.   On arrival, patient is on BiPAP with some increased work of breathing. She reports headache at the site where the the BiPAP mask rests on her forehead, denies chest pain, admits shortness of breath, denies abdominal pain, denies dysuria and admits chronic back pain. She reports swelling in the right foot and right calf for about a month prior to presentation to [facility] that has resolved.  Patient likes to have intubation or trach done.   PAST MEDICAL HISTORY :  She  has a past medical history of Anxiety; Bipolar disorder (HCC); COPD (chronic obstructive pulmonary disease) (HCC); GERD  (gastroesophageal reflux disease); and Hypertension.  PAST SURGICAL HISTORY: She  has no past surgical history on file.  Allergies  Allergen Reactions  . Fentanyl Shortness Of Breath  . Meperidine Other (See Comments)    chest pain-"feels like squeezing heart"  . Azithromycin Other (See Comments)    VRE  . Codeine Nausea Only  . Levofloxacin Other (See Comments)    Unknown  . Other Other (See Comments)    BLACK STICHES=BODY REJECTS THEM  . Penicillins Rash  . Sulfa Antibiotics Rash    No current facility-administered medications on file prior to encounter.    No current outpatient prescriptions on file prior to encounter.    FAMILY HISTORY:  Her has no family status information on file.    SOCIAL HISTORY: She    REVIEW OF SYSTEMS:   Per HPI  SUBJECTIVE:  She reports headache at the site where the the BiPAP mask rests on her forehead, denies chest pain, admits shortness of breath, denies abdominal pain, denies dysuria and admits chronic back pain. She reports swelling in the right foot and right calf for about a month prior to presentation to [facility] that has resolved.   VITAL SIGNS: BP (!) 142/103 (BP Location: Right Arm)   Pulse (!) 107   Resp (!) 32   Ht 5' (1.524 m)   Wt 182 lb 1.6 oz (82.6 kg)   SpO2 96%   BMI 35.56 kg/m   HEMODYNAMICS:    VENTILATOR SETTINGS: Vent Mode: BIPAP FiO2 (%):  [40 %] 40 % Set Rate:  [10 bmp] 10 bmp PEEP:  [7 cmH20] 7 cmH20 Pressure Support:  [7 cmH20-9 cmH20] 9 cmH20  INTAKE / OUTPUT: No intake/output data  recorded.  PHYSICAL EXAMINATION: GEN: mild distress, BIPAP in place Oropharynx: chapped lips under bipap mask CVS: tachycardic to 110's, regular, normal s1 and s2, no murmurs, no edema, DP pulses 2+ bilaterally RESP: some increased work of breathing, reduced movement bilaterally, no crackles or wheeze. ?paradoxical breath GI: Bowel sounds present and normal, soft, non-tender,non-distended GU: foley in  place MSK: no focal tenderness SKIN: no apparent lesion NEURO: alert and oriented appropriately, no gross deficits, answers question appropriately, moves extremities PSYCH: appropriate mood and affect   LABS:  BMET  Recent Labs Lab 08/26/16 1804  NA 140  K 3.3*  CL 90*  CO2 36*  BUN 13  CREATININE 0.42*  GLUCOSE 184*    Electrolytes  Recent Labs Lab 08/26/16 1804  CALCIUM 9.2  MG 2.3  PHOS 3.9    CBC  Recent Labs Lab 08/26/16 1804  WBC 9.7  HGB 12.1  HCT 39.8  PLT 236    Coag's No results for input(s): APTT, INR in the last 168 hours.  Sepsis Markers  Recent Labs Lab 08/26/16 1804  LATICACIDVEN 0.9    ABG No results for input(s): PHART, PCO2ART, PO2ART in the last 168 hours.  Liver Enzymes  Recent Labs Lab 08/26/16 1804  AST 12*  ALT 14  ALKPHOS 45  BILITOT 0.9  ALBUMIN 3.5    Cardiac Enzymes  Recent Labs Lab 08/26/16 1804  TROPONINI 0.04*    Glucose No results for input(s): GLUCAP in the last 168 hours.  Imaging Dg Chest Port 1 View  Result Date: 08/26/2016 CLINICAL DATA:  Dyspnea. EXAM: PORTABLE CHEST 1 VIEW COMPARISON:  08/25/2016. FINDINGS: Normal sized heart. Stable linear scar in the right upper lung zone and left perihilar region. Otherwise, clear lungs with normal vascularity. No pleural fluid. Unremarkable bones. IMPRESSION: No acute abnormality. Electronically Signed   By: Beckie Salts M.D.   On: 08/26/2016 18:44     STUDIES:  11/16: CXR   CULTURES: Culture at OSH negative  ANTIBIOTICS: 08/24/2016 cefepime 08/26/2016 08/24/2016 vancomycin 08/26/2016  SIGNIFICANT EVENTS: 08/22/2016: Admitted to [facility] where his acute on chronic COPD exacerbation and hypoosmolar hyponatremia 08/24/2016: Started on vancomycin and cefepime 08/26/2016: Transferred to [facility] ICU for possible tracheostomy due to continuous need for BiPAP and increased oxygen requirement  LINES/TUBES: Foley placed at  [place]   DISCUSSION: Cheryl Wheeler is a 50 year old female with ?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   ASSESSMENT / PLAN:  PULMONARY A: ? End-stage COPD on 3 L & nocturnal BiPAP Acute on chronic respiratory failure Chest x-ray without acute cardiopulmonary process P:   Continue BiPAP for now, may need ETT and trach DuoNeb every 6 hours Albuterol nebs every 2 hours when necessary Solu-Medrol 60 mg every 6 hours Dulera twice a day LE Doppler Consider CTA. Well's score 3-6  CARDIOVASCULAR A:  Hypertension Tachycardia Elevated BNP to 168. No pulm congestion Trop 0.04>> P:  Metoprolol q6h  Monitor BP and HR Tele EKG Cycle Troponin Consider Echo Consider Lasix 20 mg once  RENAL A:   Hypokalemia P: UA   KCl x2  GASTROINTESTINAL A:   GERD P:   Pepcid NPO with   HEMATOLOGIC A:   Chronic granulomatous disease Anemia ?DVT/PE  P:  Daily CBC Stat LE doppler Consider CTA Lovenox for DVT prophylaxis SCD  INFECTIOUS A:   No issues Cefepime & Vanc at [facility] 11/14>11/16 P:  Monitor CBC Blood culture if fever Hold antibiotic  ENDOCRINE A:   Mildy ^glucose P:   SSI when start feeding  NEUROLOGIC A:   Bipolar disorder Anxiety Chronic back pain P:   Pharmacy consult for home meds Valproic acid 500 mg twice a day  Valproic acid level  FAMILY  - Updates: updated husband over the phone at [phone]. He will be here in the morning.   - Inter-disciplinary family meet or Palliative Care meeting due by:  day 7 of admission    Pulmonary and Critical Care Medicine [facility] Pager: [phone]  08/26/2016, 7:48 PM

## 2016-08-26 NOTE — Procedures (Signed)
Intubation Procedure Note Cheryl Wheeler [id number] 14-Jan-1966  Procedure: Intubation Indications: Respiratory insufficiency  Procedure Details Consent: Risks of procedure as well as the alternatives and risks of each were explained to the (patient/caregiver).  Consent for procedure obtained. Time Out: Verified patient identification, verified procedure, site/side was marked, verified correct patient position, special equipment/implants available, medications/allergies/relevent history reviewed, required imaging and test results available.  Performed  Drugs:  100 mcg Fentanyl, 2 mg Versed, 20 mg Etomidate, 50 mg Rocuronium. VL x 1 using glidescope with # 3 blade. Grade 1 view. 7.5 tube visualized passing through vocal cords. Following intubation:  positive color change on ETCO2, condensation seen in endotracheal tube, equal breath sounds bilaterally.  Evaluation Hemodynamic Status: BP stable throughout; O2 sats: stable throughout Patient's Current Condition: stable Complications: No apparent complications Patient did tolerate procedure well. Chest X-ray ordered to verify placement.  CXR: pending.   [name], [place]PA - C [place] [facility] Pager: [phone]  or [phone] 08/26/2016, 11:46 PM

## 2016-08-26 NOTE — Progress Notes (Signed)
*  Preliminary Results* Bilateral lower extremity venous duplex completed. Bilateral lower extremities are negative for deep vein thrombosis. There is no evidence of Baker's cyst bilaterally.  08/26/2016 8:41 PM Gertie FeyMichelle Carmina Walle, BS, RVT, RDCS, RDMS

## 2016-08-27 ENCOUNTER — Inpatient Hospital Stay (HOSPITAL_COMMUNITY): Payer: Medicare HMO

## 2016-08-27 DIAGNOSIS — R0600 Dyspnea, unspecified: Secondary | ICD-10-CM | POA: Diagnosis present

## 2016-08-27 DIAGNOSIS — Z978 Presence of other specified devices: Secondary | ICD-10-CM

## 2016-08-27 DIAGNOSIS — Z4659 Encounter for fitting and adjustment of other gastrointestinal appliance and device: Secondary | ICD-10-CM

## 2016-08-27 DIAGNOSIS — J9601 Acute respiratory failure with hypoxia: Secondary | ICD-10-CM

## 2016-08-27 LAB — BASIC METABOLIC PANEL
Anion gap: 9 (ref 5–15)
BUN: 20 mg/dL (ref 6–20)
CHLORIDE: 96 mmol/L — AB (ref 101–111)
CO2: 36 mmol/L — AB (ref 22–32)
Calcium: 9.1 mg/dL (ref 8.9–10.3)
Creatinine, Ser: 0.47 mg/dL (ref 0.44–1.00)
GFR calc non Af Amer: >60 mL/min (ref >60)
Glucose, Bld: 169 mg/dL — ABNORMAL HIGH (ref 65–99)
POTASSIUM: 3.8 mmol/L (ref 3.5–5.1)
SODIUM: 141 mmol/L (ref 135–145)

## 2016-08-27 LAB — BLOOD GAS, ARTERIAL
ACID-BASE EXCESS: 11.5 mmol/L — AB (ref 0.0–2.0)
Acid-Base Excess: 13.6 mmol/L — ABNORMAL HIGH (ref 0.0–2.0)
Acid-Base Excess: 13.5 mmol/L — ABNORMAL HIGH (ref 0.0–2.0)
Acid-Base Excess: 11.6 mmol/L — ABNORMAL HIGH (ref 0.0–2.0)
BICARBONATE: 41.5 mmol/L — AB (ref 20.0–28.0)
Bicarbonate: 39.4 mmol/L — ABNORMAL HIGH (ref 20.0–28.0)
Bicarbonate: 37.5 mmol/L — ABNORMAL HIGH (ref 20.0–28.0)
Bicarbonate: 37 mmol/L — ABNORMAL HIGH (ref 20.0–28.0)
DRAWN BY: 331761
Drawn by: 418751
Drawn by: 418751
Drawn by: 331761
FIO2: 60
FIO2: 50
FIO2: 50
FIO2: 50
LHR: 15 {breaths}/min
MECHVT: 360 mL
O2 SAT: 97.5 %
O2 SAT: 97.7 %
O2 Saturation: 96 %
O2 Saturation: 97.9 %
PATIENT TEMPERATURE: 99.3
PATIENT TEMPERATURE: 98.4
PCO2 ART: 71.2 mmHg — AB (ref 32.0–48.0)
PCO2 ART: 70.5 mmHg — AB (ref 32.0–48.0)
PCO2 ART: 63.8 mmHg — AB (ref 32.0–48.0)
PEEP/CPAP: 5 cmH2O
PEEP: 5 cmH2O
PEEP: 5 cmH2O
PEEP: 5 cmH2O
PH ART: 7.347 — AB (ref 7.350–7.450)
PH ART: 7.381 (ref 7.350–7.450)
PO2 ART: 104 mmHg (ref 83.0–108.0)
Patient temperature: 98.6
Patient temperature: 98.6
RATE: 24 resp/min
RATE: 24 resp/min
RATE: 24 resp/min
VT: 360 mL
VT: 360 mL
VT: 360 mL
pCO2 arterial: 104 mmHg (ref 32.0–48.0)
pH, Arterial: 7.223 — ABNORMAL LOW (ref 7.350–7.450)
pH, Arterial: 7.362 (ref 7.350–7.450)
pO2, Arterial: 102 mmHg (ref 83.0–108.0)
pO2, Arterial: 104 mmHg (ref 83.0–108.0)
pO2, Arterial: 105 mmHg (ref 83.0–108.0)

## 2016-08-27 LAB — TROPONIN I: Troponin I: <0.03 ng/mL (ref <0.03)

## 2016-08-27 LAB — GLUCOSE, CAPILLARY
GLUCOSE-CAPILLARY: 178 mg/dL — AB (ref 65–99)
GLUCOSE-CAPILLARY: 245 mg/dL — AB (ref 65–99)
Glucose-Capillary: 168 mg/dL — ABNORMAL HIGH (ref 65–99)
Glucose-Capillary: 161 mg/dL — ABNORMAL HIGH (ref 65–99)
Glucose-Capillary: 186 mg/dL — ABNORMAL HIGH (ref 65–99)

## 2016-08-27 LAB — CBC
HCT: 38.9 % (ref 36.0–46.0)
HEMOGLOBIN: 11.3 g/dL — AB (ref 12.0–15.0)
MCH: 25.1 pg — AB (ref 26.0–34.0)
MCHC: 29 g/dL — ABNORMAL LOW (ref 30.0–36.0)
MCV: 86.4 fL (ref 78.0–100.0)
Platelets: 225 10*3/uL (ref 150–400)
RBC: 4.5 MIL/uL (ref 3.87–5.11)
RDW: 16 % — ABNORMAL HIGH (ref 11.5–15.5)
WBC: 9.7 10*3/uL (ref 4.0–10.5)

## 2016-08-27 LAB — PHOSPHORUS: PHOSPHORUS: 5 mg/dL — AB (ref 2.5–4.6)

## 2016-08-27 LAB — MAGNESIUM: MAGNESIUM: 2.5 mg/dL — AB (ref 1.7–2.4)

## 2016-08-27 LAB — TRIGLYCERIDES: TRIGLYCERIDES: 333 mg/dL — AB (ref <150)

## 2016-08-27 LAB — HIV ANTIBODY (ROUTINE TESTING W REFLEX): HIV SCREEN 4TH GENERATION: NONREACTIVE

## 2016-08-27 MED ORDER — LABETALOL HCL 5 MG/ML IV SOLN
20.0000 mg | INTRAVENOUS | Status: DC | PRN
  Administered 2016-08-27: 20 mg via INTRAVENOUS
  Filled 2016-08-27: qty 4

## 2016-08-27 MED ORDER — MIRTAZAPINE 30 MG PO TBDP
30.0000 mg | ORAL_TABLET | Freq: Every day | ORAL | Status: DC
Start: 2016-08-27 — End: 2016-09-06
  Administered 2016-08-27 – 2016-09-05 (×10): 30 mg
  Filled 2016-08-27 (×10): qty 1

## 2016-08-27 MED ORDER — MIDAZOLAM HCL 2 MG/2ML IJ SOLN
2.0000 mg | INTRAMUSCULAR | Status: DC | PRN
  Administered 2016-08-27 – 2016-09-03 (×16): 2 mg via INTRAVENOUS
  Filled 2016-08-27 (×18): qty 2

## 2016-08-27 MED ORDER — SODIUM CHLORIDE 0.9 % IV SOLN
30.0000 meq | Freq: Once | INTRAVENOUS | Status: AC
  Administered 2016-08-27: 30 meq via INTRAVENOUS
  Filled 2016-08-27: qty 15

## 2016-08-27 MED ORDER — INSULIN ASPART 100 UNIT/ML ~~LOC~~ SOLN
1.0000 [IU] | SUBCUTANEOUS | Status: DC
  Administered 2016-08-27 – 2016-08-28 (×5): 2 [IU] via SUBCUTANEOUS
  Administered 2016-08-28: 3 [IU] via SUBCUTANEOUS

## 2016-08-27 MED ORDER — CHLORHEXIDINE GLUCONATE 0.12% ORAL RINSE (MEDLINE KIT)
15.0000 mL | Freq: Two times a day (BID) | OROMUCOSAL | Status: DC
  Administered 2016-08-27 – 2016-09-03 (×15): 15 mL via OROMUCOSAL

## 2016-08-27 MED ORDER — POTASSIUM CHLORIDE 20 MEQ/15ML (10%) PO SOLN: 40.0000 meq | Freq: Once | ORAL | Status: DC

## 2016-08-27 MED ORDER — VITAL HIGH PROTEIN PO LIQD
1000.0000 mL | ORAL | Status: DC
  Administered 2016-08-27: 1000 mL

## 2016-08-27 MED ORDER — RISPERIDONE 2 MG PO TABS
2.0000 mg | ORAL_TABLET | Freq: Every day | ORAL | Status: DC
  Administered 2016-08-27 – 2016-08-29 (×3): 2 mg
  Filled 2016-08-27 (×3): qty 1

## 2016-08-27 MED ORDER — VITAL HIGH PROTEIN PO LIQD
1000.0000 mL | ORAL | Status: DC
  Administered 2016-08-28 (×2)
  Administered 2016-08-28 (×2): 1000 mL
  Administered 2016-08-28 (×2)
  Administered 2016-08-29: 1000 mL
  Administered 2016-08-29 – 2016-08-30 (×8)
  Administered 2016-08-30: 1000 mL
  Administered 2016-08-30 (×2)
  Administered 2016-08-31: 1000 mL
  Administered 2016-08-31 (×2)
  Filled 2016-08-27 (×2): qty 1000

## 2016-08-27 MED ORDER — IOPAMIDOL (ISOVUE-370) INJECTION 76%
100.0000 mL | Freq: Once | INTRAVENOUS | Status: AC | PRN
  Administered 2016-08-27: 100 mL via INTRAVENOUS

## 2016-08-27 MED ORDER — PRO-STAT SUGAR FREE PO LIQD
30.0000 mL | Freq: Two times a day (BID) | ORAL | Status: DC
  Administered 2016-08-27 – 2016-08-31 (×9): 30 mL via ORAL
  Filled 2016-08-27 (×10): qty 30

## 2016-08-27 MED ORDER — IOPAMIDOL (ISOVUE-370) INJECTION 76%
INTRAVENOUS | Status: AC
  Filled 2016-08-27: qty 100

## 2016-08-27 MED ORDER — LABETALOL HCL 5 MG/ML IV SOLN
10.0000 mg | INTRAVENOUS | Status: DC | PRN
  Filled 2016-08-27: qty 4

## 2016-08-27 MED ORDER — MIRTAZAPINE 30 MG PO TABS: 30.0000 mg | ORAL_TABLET | Freq: Every day | ORAL | Status: DC

## 2016-08-27 MED ORDER — ORAL CARE MOUTH RINSE
15.0000 mL | Freq: Four times a day (QID) | OROMUCOSAL | Status: DC
  Administered 2016-08-27 – 2016-09-03 (×28): 15 mL via OROMUCOSAL

## 2016-08-27 MED ORDER — MIDAZOLAM HCL 2 MG/2ML IJ SOLN
2.0000 mg | Freq: Once | INTRAMUSCULAR | Status: AC
  Administered 2016-08-26: 2 mg via INTRAVENOUS

## 2016-08-27 NOTE — Progress Notes (Signed)
RT assisted in pt transport to CT and back to 2M06 on vent. Pt's vitals remained stable throughout.

## 2016-08-27 NOTE — Care Management Note (Signed)
Case Management Note  Patient Details  Name: Cheryl Wheeler MRN: [id number] Date of Birth: 04-Mar-1966  Subjective/Objective:    Pt admitted with acute resp failure with hypoxia - pt is on the ventilator                Action/Plan:   PTA from home with husband.  Per husband and daughter at bedside; pt uses walker and wheelchair if needed in the home and is independent with ADLs.  Pt is on home oxygen 2 liters supplied by [employer] and husband has portable oxygen tank.  Pt also has shower chair - family denied needing additional equipment in the home.     Expected Discharge Date:                  Expected Discharge Plan:  Home w Home Health Services  In-House Referral:     Discharge planning Services  CM Consult  Post Acute Care Choice:    Choice offered to:     DME Arranged:    DME Agency:     HH Arranged:    HH Agency:     Status of Service:  In process, will continue to follow  If discussed at Long Length of Stay Meetings, dates discussed:    Additional Comments: Per rounds; pt is tenatively scheduled for trach on Monday 08/30/16 - CM discussed discharge options with pt including LTACH, SNF and home if pt still requires supplemental oxygen/acute care when stable for discharge from hospital Cherylann ParrClaxton, Lafe Clerk S, RN 08/27/2016, 2:03 PM

## 2016-08-27 NOTE — Progress Notes (Signed)
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  On 08/22/2016, patient presented to [facility] with dyspnea, dry cough and subjective fever and hypoxia to 70's. Initial ABG was 7.4/66/45/41 [facility]. CBC significant for mild leukocytosis. BMP remarkable for hypoosmolar hyponatremia to 128 otherwise within normal limits. UA was remarkable for pyuria but urine culture was negative. She was started on Solu-Medrol, DuoNeb, Pulmicort and guanfacine-DM for COPD exacerbation. However, she continued to have worsening dyspnea with increased oxygen requirement on BiPAP continuously. She was also started on cefepime and vancomycin on 08/24/2016.  On the day of transfer, vitals and was normal limits except for tachypnea to 25, ABG was 7.4/65/75/41/ Last BMP within normal limits. Mild leukocytosis and hyponatremia resolved.   On arrival, patient is on BiPAP with some increased work of breathing. She reports headache at the site where the the BiPAP mask rests on her forehead, denies chest pain, admits shortness of breath, denies abdominal pain, denies dysuria and admits chronic back pain. She reports swelling in the right foot and right calf for about a month prior to presentation to [facility] that has resolved.  Patient likes to have intubation or trach done.   SIGNIFICANT EVENTS: 08/22/2016: [facility] where his acute on chronic COPD exacerbation and hypoosmolar hyponatremia 08/24/2016: Started on vancomycin and  cefepime 08/26/2016: [facility] for possible tracheostomy due to continuous need for BiPAP and increased oxygen requirement.  08/27/2016: Intubated    SUBJECTIVE:  WUA this AM. Only purposeful movement this AM is grabbing at tube.    VITAL SIGNS: BP 140/69   Pulse 73   Temp 99.3 F (37.4 C) (Oral)   Resp (!) 24   Ht 5' (1.524 m)   Wt 182 lb 1.6 oz (82.6 kg)   SpO2 96%   BMI 35.56 kg/m   HEMODYNAMICS:    VENTILATOR SETTINGS: Vent Mode: PRVC FiO2 (%):  [40 %-60 %] 60 % Set Rate:  [10 bmp-24 bmp] 24 bmp Vt Set:  [360 mL] 360 mL PEEP:  [5 cmH20-7 cmH20] 5 cmH20 Pressure Support:  [7 cmH20-9 cmH20] 9 cmH20 Plateau Pressure:  [15 cmH20-17 cmH20] 17 cmH20  INTAKE / OUTPUT: I/O last 3 completed shifts: In: 1702.6 [I.V.:1117.6; IV Piggyback:585] Out: 500 [Urine:500]  PHYSICAL EXAMINATION: GEN: sedated, lying in bed  Oropharynx: chapped lips under bipap mask CVS: tachycardic to 110's, regular, normal s1 and s2, no murmurs, no edema, DP pulses 2+ bilaterally RESP:  reduced movement bilaterally, no crackles or wheeze GI: Bowel sounds present and normal, soft, non-tender,non-distended GU: foley in place NEURO: sedated, moves all extremities without purposeful movement, does not follow commands    LABS:  BMET  Recent Labs Lab 08/26/16 1804 08/27/16 0510  NA 140 141  K 3.3* 3.8  CL 90* 96*  CO2 36* 36*  BUN 13 20  CREATININE 0.42* 0.47  GLUCOSE 184* 169*    Electrolytes  Recent Labs Lab 08/26/16 1804 08/27/16 0510  CALCIUM 9.2 9.1  MG 2.3 2.5*  PHOS 3.9 5.0*    CBC  Recent Labs  Lab 08/26/16 1804  WBC 9.7  HGB 12.1  HCT 39.8  PLT 236    Coag's No results for input(s): APTT, INR in the last 168 hours.  Sepsis Markers  Recent Labs Lab 08/26/16 1804 08/26/16 2028  LATICACIDVEN 0.9 0.8  PROCALCITON <0.10  --     ABG  Recent Labs Lab 08/26/16 2030 08/27/16 0111 08/27/16 0223  PHART 7.413 7.223* 7.362  PCO2ART 65.6* 104*  71.2*  PO2ART 87.4 102 104    Liver Enzymes  Recent Labs Lab 08/26/16 1804  AST 12*  ALT 14  ALKPHOS 45  BILITOT 0.9  ALBUMIN 3.5    Cardiac Enzymes  Recent Labs Lab 08/26/16 1804 08/26/16 2313 08/27/16 0510  TROPONINI 0.04* <0.03 <0.03    Glucose  Recent Labs Lab 08/27/16 0753  GLUCAP 168*    Imaging Portable Chest Xray  Result Date: 08/27/2016 CLINICAL DATA:  Respiratory failure EXAM: PORTABLE CHEST 1 VIEW COMPARISON:  08/26/2016 FINDINGS: Endotracheal tube tip is 2.2 cm above the carina. Nasogastric tube extends into the stomach and beyond the inferior edge of the image. The lungs are clear. No pneumothorax. No large effusion. Normal pulmonary vasculature. Unremarkable hilar and mediastinal contours. IMPRESSION: 1.  Support equipment appears satisfactorily positioned. 2. No consolidation or effusion.  No pneumothorax. Electronically Signed   By: Ellery Plunk M.D.   On: 08/27/2016 00:19   Dg Chest Port 1 View  Result Date: 08/26/2016 CLINICAL DATA:  Dyspnea. EXAM: PORTABLE CHEST 1 VIEW COMPARISON:  08/25/2016. FINDINGS: Normal sized heart. Stable linear scar in the right upper lung zone and left perihilar region. Otherwise, clear lungs with normal vascularity. No pleural fluid. Unremarkable bones. IMPRESSION: No acute abnormality. Electronically Signed   By: Beckie Salts M.D.   On: 08/26/2016 18:44   Dg Abd Portable 1v  Result Date: 08/27/2016 CLINICAL DATA:  Orogastric tube placement EXAM: PORTABLE ABDOMEN - 1 VIEW COMPARISON:  07/21/2007 FINDINGS: The orogastric tube extends well into the stomach with tip in the region of the distal gastric body. IMPRESSION: Enteric tube reaches well into the stomach. Electronically Signed   By: Ellery Plunk M.D.   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   ASSESSMENT / PLAN:  PULMONARY A: ? End-stage COPD on 3 L & nocturnal BiPAP Acute on chronic respiratory failure Chest x-ray without acute cardiopulmonary process Preliminary LE doppler results: No DVT P:   Mechanical Ventilation  DuoNeb q6h  Albuterol nebs q2h prn  Solu-Medrol 60 mg every 6 hours Dulera BID  ABG, keep same MV, but repeat abg in afternoon, likley can reduce rate then  CARDIOVASCULAR A:  Elevated BNP to 168. No pulm congestion Troponins negative  P:  Metoprolol q6h  Monitor BP and HR Tele Consider Echo Consider Lasix 20 mg once  RENAL A:   R/o Leary uti P: Monitor BMET  Replete electrolytes prn  Change foley  GASTROINTESTINAL A:   GERD P:   Pepcid NPO  Initiate TFs  HEMATOLOGIC A:   Chronic granulomatous disease Anemia  P:  Daily CBC Lovenox for DVT prophylaxis SCD  INFECTIOUS A:   R/o UTI  [facility]ime & Vanc [facility] 11/14>11/16 P:   Monitor CBC Blood culture if fever Hold antibiotic Culture urine  ENDOCRINE A:   At risk for hyperglycemia  P:   Sensitive SSI   NEUROLOGIC A:   Bipolar disorder Anxiety Chronic back pain P:   Pharmacy consult for home meds Valproic acid 500 mg twice a day    FAMILY  - Updates: No family at bedside   - Inter-disciplinary family meet or Palliative Care meeting due by:  day 7 of admission    Marcy Sirenatherine Wallace, D.O. 08/27/2016, 8:13 AM PGY-2, [facility]  STAFF NOTE: I, Rory Percyaniel Feinstein, MD FACP have personally reviewed patient's available data, including medical history, events of note, physical examination and test results as part of my evaluation. I have discussed with resident/NP and other care providers such  as pharmacist, RN and RRT. In addition, I personally evaluated patient and elicited key findings of: awake, fc, calm, prop, lungs distant, all pcxr and prior scans reviewed, likley this is copd exacerbation worsening terminal lung dz, need to r/o in situ PE, get ct chest, abg reviewed, repeat in afternoon to avoid alkalosis and bicarb wasting, she is at baseline co2 now, wean attempts consider cpap 5 ps5, goal 2 hours, considering trach here, change foley UA dirty?, culture it, feed The patient is critically ill with multiple organ systems failure and requires high complexity decision making for assessment and support, frequent evaluation and titration of therapies, application of advanced monitoring technologies and extensive interpretation of multiple databases.   Critical Care Time devoted to patient care services described in this note is 35 Minutes. This time reflects time of care of this signee: Rory Percyaniel Feinstein, MD FACP. This critical care time does not reflect procedure time, or teaching time or supervisory time of PA/NP/Med student/Med Resident etc but could involve care discussion time. Rest per NP/medical resident whose note is outlined above and that I agree with   Mcarthur Rossettianiel J. Tyson AliasFeinstein, MD, FACP Pgr: [phone] [facility] 08/27/2016 9:47 AM

## 2016-08-27 NOTE — Progress Notes (Signed)
Initial Nutrition Assessment  DOCUMENTATION CODES:   Obesity unspecified  INTERVENTION:   - Vital High Protein @ 30 mL/hr (720 mL) to provide 720 kcal, 63 grams protein, and 605 mL free water daily. Pro-stat 30 mL BID to provide 200 kcal and 30 grams protein.  TF regimen and propofol provide 1179 kcal (102% estimated energy needs), 93 grams protein (100% estimated protein needs), and 605 mL free water.  NUTRITION DIAGNOSIS:   Inadequate oral intake related to inability to eat as evidenced by NPO status.  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 after needing continuous positive pressure ventilation with BiPAP for 3 days.  Pt is currently intubated on ventilator support. Pt with OG tube extending well into stomach with tip in region of the distal gastric body. MV: 8.7 L/min Max temperature in 24 hours: 37.5 degrees Celsius BP (MAP): 158/75 (98)  Propofol infusing at 9.8 mL/hr which provides 259 kcal daily.  Spoke with RN who reports pt is unlikely to be extubated today. Consulted for TF initiation and management.  Medications reviewed and include 20 mg Pepcid BID, sliding scale Novolog, 5 mg Lopressor QID  Labs reviewed and include elevated phosphorus (5.0 mg/dL), elevated magnesium (2.5 mg/dL) CBG: [id number] mg/dL  NFPE: Exam completed. No fat depletion, no muscle depletion, and no edema noted.  Diet Order:  Diet NPO time specified  Skin:  Reviewed, no issues (breakdown on cheeks from bipap)  Last BM:  08/26/16  Height:   Ht Readings from Last 1 Encounters:  08/26/16 5' (1.524 m)    Weight:   Wt  Readings from Last 1 Encounters:  08/27/16 182 lb 1.6 oz (82.6 kg)    Ideal Body Weight:  45.5 kg  BMI:  Body mass index is 35.56 kg/m.  Estimated Nutritional Needs:   Kcal:  [phone] (11-14 kcal/kg)  Protein:  >/= 91 grams (>/= 2.0 g/kg IBW)  Fluid:  per MD  EDUCATION NEEDS:   No education needs identified at this time  Rosemarie AxKate Shekelia Boutin Dietetic Intern Pager Number: [phone]

## 2016-08-27 NOTE — Progress Notes (Signed)
Paged Resident Taye on call regarding pts increased BP

## 2016-08-28 ENCOUNTER — Inpatient Hospital Stay (HOSPITAL_COMMUNITY): Payer: Medicare HMO

## 2016-08-28 LAB — CBC
HEMATOCRIT: 34.5 % — AB (ref 36.0–46.0)
HEMOGLOBIN: 9.9 g/dL — AB (ref 12.0–15.0)
MCH: 25 pg — AB (ref 26.0–34.0)
MCHC: 28.7 g/dL — ABNORMAL LOW (ref 30.0–36.0)
MCV: 87.1 fL (ref 78.0–100.0)
Platelets: 201 10*3/uL (ref 150–400)
RBC: 3.96 MIL/uL (ref 3.87–5.11)
RDW: 15.6 % — ABNORMAL HIGH (ref 11.5–15.5)
WBC: 7.2 10*3/uL (ref 4.0–10.5)

## 2016-08-28 LAB — BASIC METABOLIC PANEL
ANION GAP: 9 (ref 5–15)
BUN: 23 mg/dL — AB (ref 6–20)
CHLORIDE: 101 mmol/L (ref 101–111)
CO2: 33 mmol/L — ABNORMAL HIGH (ref 22–32)
Calcium: 8.7 mg/dL — ABNORMAL LOW (ref 8.9–10.3)
Creatinine, Ser: 0.42 mg/dL — ABNORMAL LOW (ref 0.44–1.00)
GFR calc Af Amer: >60 mL/min (ref >60)
GFR calc non Af Amer: >60 mL/min (ref >60)
GLUCOSE: 209 mg/dL — AB (ref 65–99)
POTASSIUM: 3.5 mmol/L (ref 3.5–5.1)
Sodium: 143 mmol/L (ref 135–145)

## 2016-08-28 LAB — GLUCOSE, CAPILLARY
GLUCOSE-CAPILLARY: 181 mg/dL — AB (ref 65–99)
GLUCOSE-CAPILLARY: 213 mg/dL — AB (ref 65–99)
GLUCOSE-CAPILLARY: 198 mg/dL — AB (ref 65–99)
Glucose-Capillary: 183 mg/dL — ABNORMAL HIGH (ref 65–99)
Glucose-Capillary: 188 mg/dL — ABNORMAL HIGH (ref 65–99)
Glucose-Capillary: 266 mg/dL — ABNORMAL HIGH (ref 65–99)

## 2016-08-28 LAB — POCT I-STAT 3, ART BLOOD GAS (G3+)
ACID-BASE EXCESS: 13 mmol/L — AB (ref 0.0–2.0)
Bicarbonate: 39.4 mmol/L — ABNORMAL HIGH (ref 20.0–28.0)
O2 Saturation: 96 %
PH ART: 7.435 (ref 7.350–7.450)
PO2 ART: 82 mmHg — AB (ref 83.0–108.0)
TCO2: 41 mmol/L (ref 0–100)
pCO2 arterial: 58.7 mmHg — ABNORMAL HIGH (ref 32.0–48.0)

## 2016-08-28 LAB — PROCALCITONIN: Procalcitonin: <0.1 ng/mL

## 2016-08-28 LAB — URINE CULTURE: Culture: NO GROWTH

## 2016-08-28 LAB — PHOSPHORUS: Phosphorus: 2.9 mg/dL (ref 2.5–4.6)

## 2016-08-28 LAB — MAGNESIUM: MAGNESIUM: 2.6 mg/dL — AB (ref 1.7–2.4)

## 2016-08-28 LAB — SEDIMENTATION RATE: Sed Rate: 7 mm/hr (ref 0–22)

## 2016-08-28 MED ORDER — POTASSIUM CHLORIDE 20 MEQ/15ML (10%) PO SOLN: 40.0000 meq | Freq: Two times a day (BID) | ORAL | Status: DC

## 2016-08-28 MED ORDER — METHYLPREDNISOLONE SODIUM SUCC 125 MG IJ SOLR: 60.0000 mg | Freq: Two times a day (BID) | INTRAMUSCULAR | Status: DC

## 2016-08-28 MED ORDER — HYDRALAZINE HCL 20 MG/ML IJ SOLN
10.0000 mg | INTRAMUSCULAR | Status: DC | PRN
  Administered 2016-08-28 – 2016-08-29 (×2): 10 mg via INTRAVENOUS
  Filled 2016-08-28 (×2): qty 1

## 2016-08-28 MED ORDER — VALPROATE SODIUM 250 MG/5ML PO SOLN
500.0000 mg | Freq: Two times a day (BID) | ORAL | Status: DC
  Administered 2016-08-28 – 2016-09-06 (×19): 500 mg
  Filled 2016-08-28 (×20): qty 10

## 2016-08-28 MED ORDER — FAMOTIDINE 40 MG/5ML PO SUSR
20.0000 mg | Freq: Two times a day (BID) | ORAL | Status: DC
  Administered 2016-08-28 – 2016-09-06 (×17): 20 mg
  Filled 2016-08-28 (×21): qty 2.5

## 2016-08-28 MED ORDER — INSULIN ASPART 100 UNIT/ML ~~LOC~~ SOLN
2.0000 [IU] | SUBCUTANEOUS | Status: DC
  Administered 2016-08-28: 6 [IU] via SUBCUTANEOUS
  Administered 2016-08-28 (×3): 4 [IU] via SUBCUTANEOUS

## 2016-08-28 MED ORDER — ETOMIDATE 2 MG/ML IV SOLN
INTRAVENOUS | Status: AC
  Filled 2016-08-28: qty 10

## 2016-08-28 MED ORDER — KETOROLAC TROMETHAMINE 15 MG/ML IJ SOLN
15.0000 mg | Freq: Four times a day (QID) | INTRAMUSCULAR | Status: DC | PRN
  Administered 2016-08-28: 15 mg via INTRAVENOUS
  Filled 2016-08-28 (×2): qty 1

## 2016-08-28 MED ORDER — LABETALOL HCL 100 MG PO TABS
100.0000 mg | ORAL_TABLET | Freq: Two times a day (BID) | ORAL | Status: DC
  Administered 2016-08-28 (×2): 100 mg
  Filled 2016-08-28 (×3): qty 1

## 2016-08-28 MED ORDER — METHYLPREDNISOLONE SODIUM SUCC 125 MG IJ SOLR
60.0000 mg | Freq: Four times a day (QID) | INTRAMUSCULAR | Status: DC
  Administered 2016-08-28 – 2016-08-29 (×4): 60 mg via INTRAVENOUS
  Filled 2016-08-28: qty 2
  Filled 2016-08-28: qty 0.96
  Filled 2016-08-28: qty 2
  Filled 2016-08-28 (×3): qty 0.96

## 2016-08-28 MED ORDER — LABETALOL HCL 5 MG/ML IV SOLN
10.0000 mg | INTRAVENOUS | Status: DC | PRN
  Administered 2016-08-28 – 2016-08-30 (×6): 10 mg via INTRAVENOUS
  Filled 2016-08-28 (×5): qty 4

## 2016-08-28 NOTE — Progress Notes (Signed)
eLink Physician-Brief Progress Note Patient Name: Cheryl NissenJonce Ann Hoffmeister DOB: 1965/11/21 MRN: [id number]   Date of Service  08/28/2016  HPI/Events of Note  Multiple issues: 1. Hypertension - BP = 195/86 and patient c/o pain - Allergic to narcotics. Creatinine = 0.42.  eICU Interventions  Will order: 1. Hydralazine 10 mg IV Q 4 hours PRN SBP > 160 or DBP > 100. 2. Toradol 15 mg IV Q 6 hours PRN pain.      Intervention Category Major Interventions: Hypertension - evaluation and management Intermediate Interventions: Pain - evaluation and management  Sommer,Steven Eugene 08/28/2016, 10:19 PM

## 2016-08-28 NOTE — Procedures (Signed)
Bronchoscopy Procedure Note Cheryl Wheeler [id number] [birth year]  Procedure: Bronchoscopy Indications: Diagnostic evaluation of the airways, Obtain specimens for culture and/or other diagnostic studies and Remove secretions  Procedure Details Consent: Risks of procedure as well as the alternatives and risks of each were explained to the (patient/caregiver).  Consent for procedure obtained. Time Out: Verified patient identification, verified procedure, site/side was marked, verified correct patient position, special equipment/implants available, medications/allergies/relevent history reviewed, required imaging and test results available.  Performed  In preparation for procedure, patient was given 100% FiO2 and bronchoscope lubricated. Sedation: prop bolus and drip  Airway entered and the following bronchi were examined: RUL, RML, RLL, LUL, LLL and Bronchi.   Procedures performed: Brushings performed no Bronchoscope removed.  , Patient placed back on 100% FiO2 at conclusion of procedure.    Evaluation Hemodynamic Status: BP stable throughout; O2 sats: stable throughout Patient's Current Condition: stable Specimens:  Sent serosanguinous fluid- slight bloody Complications: No apparent complications Patient did tolerate procedure well.   Nelda BucksFEINSTEIN,Kentaro Alewine J. 08/28/2016   1. Slight airway edema rt greater left 2. Mild tracheal malcia 3. eyrthema at RML with mild blood, NOT c/w DAH 4. BAL RML, bloody, slight cleared with lavage 5. Husband requested low volumes, placed total 40 cc  Tolerated well  Mcarthur Rossettianiel J. Tyson AliasFeinstein, MD, FACP Pgr: [phone] [name] [facility]

## 2016-08-28 NOTE — Progress Notes (Signed)
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  On 08/22/2016, patient [facility] with dyspnea, dry cough and subjective fever and hypoxia to 70's. Initial ABG [facility] 7.4/66/45/41 [facility]. CBC significant for mild leukocytosis. BMP remarkable for hypoosmolar hyponatremia to 128 otherwise within normal limits. UA was remarkable for pyuria but urine culture was negative. She was started on Solu-Medrol, DuoNeb, Pulmicort and guanfacine-DM for COPD exacerbation. However, she continued to have worsening dyspnea with increased oxygen requirement on BiPAP continuously. She was also started on cefepime and vancomycin on 08/24/2016.  On the day of transfer, vitals and was normal limits except for tachypnea to 25, ABG was 7.4/65/75/41/ Last BMP within normal limits. Mild leukocytosis and hyponatremia resolved.   On arrival, patient is on BiPAP with some increased work of breathing. She reports headache at the site where the the BiPAP mask rests on her forehead, denies chest pain, admits shortness of breath, denies abdominal pain, denies dysuria and admits chronic back pain. She reports swelling in the right foot and right calf for about a month prior to presentation [facility] that has resolved.  Patient likes to have intubation or trach done.   SIGNIFICANT EVENTS: 08/22/2016: [facility] where his acute on chronic COPD exacerbation and hypoosmolar hyponatremia 08/24/2016: Started on vancomycin and  cefepime 08/26/2016: [facility] due to continuous need for BiPAP and increased oxygen requirement.  08/27/2016: Intubated. CT chest negative for PE.   SUBJECTIVE:  CBGs trending up overnight. Otherwise no acute events.     VITAL SIGNS: BP (!) 182/81   Pulse (!) 104   Temp 99.1 F (37.3 C) (Oral)   Resp (!) 23   Ht 5' (1.524 m)   Wt 186 lb 1.1 oz (84.4 kg)   SpO2 90%   BMI 36.34 kg/m   HEMODYNAMICS:    VENTILATOR SETTINGS: Vent Mode: PRVC FiO2 (%):  [40 %-50 %] 40 % Set Rate:  [24 bmp] 24 bmp Vt Set:  [360 mL] 360 mL PEEP:  [5 cmH20] 5 cmH20 Plateau Pressure:  [16 cmH20-20 cmH20] 19 cmH20  INTAKE / OUTPUT: I/O last 3 completed shifts: In: 3187.6 [I.V.:2201; NG/GT:296.7; IV Piggyback:690] Out: 1005 [Urine:855; Emesis/NG output:150]  PHYSICAL EXAMINATION: GEN: sedated, lying in bed  Oropharynx: chapped lips under bipap mask CVS: RRR, normal s1 and s2, no murmurs, no edema, DP pulses 2+ bilaterally RESP:  reduced movement bilaterally, no crackles or wheeze GI: Bowel sounds present and normal, soft, non-tender,non-distended GU: foley in place NEURO: sedated, opens eyes to tactile stimuli and voice, does not follow commands    LABS:  BMET  Recent Labs Lab 08/26/16 1804 08/27/16 0510  NA 140 141  K 3.3* 3.8  CL 90* 96*  CO2 36* 36*  BUN 13 20  CREATININE 0.42* 0.47  GLUCOSE 184* 169*    Electrolytes  Recent Labs Lab 08/26/16 1804 08/27/16 0510  CALCIUM 9.2 9.1  MG 2.3 2.5*  PHOS 3.9 5.0*    CBC  Recent Labs Lab 08/26/16 1804 08/27/16 0805 08/28/16  0536  WBC 9.7 9.7 7.2  HGB 12.1 11.3* 9.9*  HCT 39.8 38.9 34.5*  PLT 236 225 201    Coag's No results for input(s): APTT, INR in the last 168 hours.  Sepsis Markers  Recent Labs Lab 08/26/16 1804 08/26/16 2028  LATICACIDVEN 0.9 0.8  PROCALCITON <0.10  --     ABG  Recent Labs Lab 08/27/16 0223 08/27/16 0840 08/27/16 1343  PHART 7.362 7.347*  7.381  PCO2ART 71.2* 70.5* 63.8*  PO2ART 104 105 104    Liver Enzymes  Recent Labs Lab 08/26/16 1804  AST 12*  ALT 14  ALKPHOS 45  BILITOT 0.9  ALBUMIN 3.5    Cardiac Enzymes  Recent Labs Lab 08/26/16 1804 08/26/16 2313 08/27/16 0510  TROPONINI 0.04* <0.03 <0.03    Glucose  Recent Labs Lab 08/27/16 0753 08/27/16 1147 08/27/16 1559 08/27/16 1942 08/27/16 2340 08/28/16 0340  GLUCAP 168* 178* 161* 186* 245* 183*    Imaging Ct Angio Chest Pe W Or Wo Contrast  Result Date: 08/27/2016 CLINICAL DATA:  Increasing dyspnea, evaluate for PE EXAM: CT ANGIOGRAPHY CHEST WITH CONTRAST TECHNIQUE: Multidetector CT imaging of the chest was performed using the standard protocol during bolus administration of intravenous contrast. Multiplanar CT image reconstructions and MIPs were obtained to evaluate the vascular anatomy. CONTRAST:  100 mL Isovue 300 IV COMPARISON:  CT chest dated 06/22/2016 FINDINGS: Cardiovascular: Satisfactory opacification of the bilateral pulmonary arteries to the segmental level. No evidence of pulmonary embolism. No evidence of thoracic aortic aneurysm or dissection. Very mild atherosclerotic calcifications of the aortic root and arch. The heart is normal size.  No pericardial effusion. Mediastinum/Nodes: No suspicious mediastinal lymphadenopathy. Visualized thyroid is grossly unremarkable. Lungs/Pleura: Evaluation of the lung parenchyma is constrained by respiratory motion. Endotracheal tube terminates 2.0 cm above the carina. Mild patchy/ground-glass opacities in the lateral right middle lobe (series 6/ image 71), suspicious for pneumonia. Patchy/platelike opacity in the left upper lobe/ lingula (series 6/ image 81), similar to the prior, favored to reflect scarring. Mild linear scarring/atelectasis in the medial right upper lobe. Calcified granulomata in the left upper lobe (series 6/ image 82) and left lower lobe (series 6/image 93), benign. No suspicious  pulmonary nodules. Underlying mild emphysematous changes. No pleural effusion or pneumothorax. Upper Abdomen: Visualized upper abdomen is notable for an enteric tube coursing into the distal gastric body. Musculoskeletal: Degenerative changes of the visualized thoracolumbar spine. Review of the MIP images confirms the above findings. IMPRESSION: No evidence of pulmonary embolism. Mild patchy/ground-glass opacities in the lateral right middle lobe, suspicious for pneumonia. Endotracheal tube terminates 2.0 cm above the carina. Additional ancillary findings as above. Electronically Signed   By: Charline Bills M.D.   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   ASSESSMENT / PLAN:  PULMONARY A: ? End-stage COPD on 3 L & nocturnal BiPAP Acute on chronic respiratory failure Chest x-ray without acute cardiopulmonary process Preliminary LE doppler results: No DVT Husband gives history Wegener's? P:   Mechanical Ventilation  DuoNeb q6h  Albuterol nebs q2h prn  steroids remain, may increase  Dulera BID  ABG ordered  -  rate to 18 SBT  CARDIOVASCULAR A:  Elevated BNP to 168. No pulm congestion Troponins negative  HTN P:  Labetalol to oral and IV prn Monitor BP and HR Tele  RENAL A:   R/o [place] uti Hypokalemia  P: Monitor BMET  Replete electrolytes prn  Change foley NS at 75 cc/hr - kvo Replete K   GASTROINTESTINAL A:   GERD P:   Pepcid NPO  Initiate TFs  HEMATOLOGIC A:   Chronic granulomatous disease Anemia  P:  Daily CBC Lovenox for DVT prophylaxis SCD  INFECTIOUS A:    R/o [facility]16 CXR with ?worsening of LLL haziness, CT chest with concern for RML PNA  P:   Monitor CBC Blood culture if fever Hold antibiotic Culture urine PCT assessment  ENDOCRINE A:   At risk for hyperglycemia  P:   Increase to Moderate SSI   NEUROLOGIC A:   Bipolar disorder Anxiety Chronic back pain P:   Pharmacy consult for home meds Valproic acid 500 mg twice a day  Re-started home Risperidone and Remeron   FAMILY  - Updates: No family at bedside   - Inter-disciplinary family meet or Palliative Care meeting due by:  day 7 of admission    Marcy Sirenatherine Wallace, D.O. 08/28/2016, 6:29 AM PGY-2, [facility]  08/28/2016 6:29 AM   STAFF NOTE: Cindi CarbonI, Daniel Feinstein, MD FACP have personally reviewed patient's available data, including medical history, events of note, physical examination and test results as part of my evaluation. I have discussed with resident/NP and other care providers such as pharmacist, RN and RRT. 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   Critical Care Time devoted to patient care services described in this note is  30 Minutes. This time reflects time of care of this signee: Rory Percyaniel Feinstein, MD FACP. This critical care time does not reflect procedure time, or teaching time or supervisory time of PA/NP/Med student/Med Resident etc but could involve care discussion time. Rest per NP/medical resident whose note is outlined above and that I agree with   Mcarthur Rossettianiel J. Tyson AliasFeinstein, MD, FACP Pgr: [phone] [place] [facility] 08/28/2016 8:30 AM

## 2016-08-29 LAB — BASIC METABOLIC PANEL
ANION GAP: 9 (ref 5–15)
ANION GAP: 9 (ref 5–15)
BUN: 28 mg/dL — ABNORMAL HIGH (ref 6–20)
BUN: 29 mg/dL — ABNORMAL HIGH (ref 6–20)
CALCIUM: 9.1 mg/dL (ref 8.9–10.3)
CO2: 34 mmol/L — ABNORMAL HIGH (ref 22–32)
CO2: 35 mmol/L — ABNORMAL HIGH (ref 22–32)
Calcium: 8.9 mg/dL (ref 8.9–10.3)
Chloride: 100 mmol/L — ABNORMAL LOW (ref 101–111)
Chloride: 101 mmol/L (ref 101–111)
Creatinine, Ser: 0.51 mg/dL (ref 0.44–1.00)
Creatinine, Ser: 0.46 mg/dL (ref 0.44–1.00)
GFR calc Af Amer: >60 mL/min (ref >60)
GFR calc Af Amer: >60 mL/min (ref >60)
GLUCOSE: 237 mg/dL — AB (ref 65–99)
GLUCOSE: 218 mg/dL — AB (ref 65–99)
POTASSIUM: 4 mmol/L (ref 3.5–5.1)
Potassium: 4 mmol/L (ref 3.5–5.1)
SODIUM: 143 mmol/L (ref 135–145)
Sodium: 145 mmol/L (ref 135–145)

## 2016-08-29 LAB — CBC
HCT: 36.4 % (ref 36.0–46.0)
HEMOGLOBIN: 10.3 g/dL — AB (ref 12.0–15.0)
MCH: 25 pg — ABNORMAL LOW (ref 26.0–34.0)
MCHC: 28.3 g/dL — AB (ref 30.0–36.0)
MCV: 88.3 fL (ref 78.0–100.0)
Platelets: 191 10*3/uL (ref 150–400)
RBC: 4.12 MIL/uL (ref 3.87–5.11)
RDW: 16.2 % — AB (ref 11.5–15.5)
WBC: 7.6 10*3/uL (ref 4.0–10.5)

## 2016-08-29 LAB — TRIGLYCERIDES: TRIGLYCERIDES: 377 mg/dL — AB (ref <150)

## 2016-08-29 LAB — PROCALCITONIN: Procalcitonin: <0.1 ng/mL

## 2016-08-29 LAB — GLUCOSE, CAPILLARY
GLUCOSE-CAPILLARY: 184 mg/dL — AB (ref 65–99)
GLUCOSE-CAPILLARY: 220 mg/dL — AB (ref 65–99)
GLUCOSE-CAPILLARY: 147 mg/dL — AB (ref 65–99)
GLUCOSE-CAPILLARY: 191 mg/dL — AB (ref 65–99)
Glucose-Capillary: 210 mg/dL — ABNORMAL HIGH (ref 65–99)

## 2016-08-29 LAB — C4 COMPLEMENT: COMPLEMENT C4, BODY FLUID: 8 mg/dL — AB (ref 14–44)

## 2016-08-29 LAB — PHOSPHORUS: PHOSPHORUS: 3.6 mg/dL (ref 2.5–4.6)

## 2016-08-29 LAB — MAGNESIUM: MAGNESIUM: 2.4 mg/dL (ref 1.7–2.4)

## 2016-08-29 LAB — C3 COMPLEMENT: C3 Complement: 103 mg/dL (ref 82–167)

## 2016-08-29 LAB — RHEUMATOID FACTOR: Rhuematoid fact SerPl-aCnc: 10.6 IU/mL (ref 0.0–13.9)

## 2016-08-29 MED ORDER — METHYLPREDNISOLONE SODIUM SUCC 125 MG IJ SOLR
60.0000 mg | Freq: Three times a day (TID) | INTRAMUSCULAR | Status: DC
  Administered 2016-08-29 – 2016-08-30 (×3): 60 mg via INTRAVENOUS
  Filled 2016-08-29: qty 0.96
  Filled 2016-08-29: qty 2
  Filled 2016-08-29 (×2): qty 0.96

## 2016-08-29 MED ORDER — RISPERIDONE 0.5 MG PO TABS
2.0000 mg | ORAL_TABLET | Freq: Two times a day (BID) | ORAL | Status: DC
  Administered 2016-08-29 – 2016-09-06 (×16): 2 mg
  Filled 2016-08-29 (×6): qty 1
  Filled 2016-08-29: qty 4
  Filled 2016-08-29 (×2): qty 1
  Filled 2016-08-29 (×3): qty 4
  Filled 2016-08-29 (×5): qty 1

## 2016-08-29 MED ORDER — LABETALOL HCL 300 MG PO TABS
300.0000 mg | ORAL_TABLET | Freq: Two times a day (BID) | ORAL | Status: DC
  Administered 2016-08-29 – 2016-08-31 (×6): 300 mg
  Filled 2016-08-29 (×7): qty 1

## 2016-08-29 MED ORDER — INSULIN ASPART 100 UNIT/ML ~~LOC~~ SOLN
0.0000 [IU] | SUBCUTANEOUS | Status: DC
  Administered 2016-08-29: 7 [IU] via SUBCUTANEOUS
  Administered 2016-08-29: 4 [IU] via SUBCUTANEOUS
  Administered 2016-08-29: 11 [IU] via SUBCUTANEOUS
  Administered 2016-08-29: 4 [IU] via SUBCUTANEOUS
  Administered 2016-08-29: 7 [IU] via SUBCUTANEOUS
  Administered 2016-08-30: 4 [IU] via SUBCUTANEOUS
  Administered 2016-08-30 (×2): 7 [IU] via SUBCUTANEOUS
  Administered 2016-08-30 – 2016-09-01 (×12): 4 [IU] via SUBCUTANEOUS
  Administered 2016-09-02: 3 [IU] via SUBCUTANEOUS
  Administered 2016-09-02 (×4): 4 [IU] via SUBCUTANEOUS
  Administered 2016-09-03: 7 [IU] via SUBCUTANEOUS
  Administered 2016-09-03: 4 [IU] via SUBCUTANEOUS
  Administered 2016-09-03: 11 [IU] via SUBCUTANEOUS
  Administered 2016-09-03: 3 [IU] via SUBCUTANEOUS
  Administered 2016-09-03: 7 [IU] via SUBCUTANEOUS
  Administered 2016-09-03: 4 [IU] via SUBCUTANEOUS
  Administered 2016-09-04 (×2): 3 [IU] via SUBCUTANEOUS
  Administered 2016-09-04: 4 [IU] via SUBCUTANEOUS
  Administered 2016-09-04: 11 [IU] via SUBCUTANEOUS
  Administered 2016-09-05: 15 [IU] via SUBCUTANEOUS
  Administered 2016-09-05 (×3): 11 [IU] via SUBCUTANEOUS
  Administered 2016-09-06: 4 [IU] via SUBCUTANEOUS

## 2016-08-29 MED ORDER — INSULIN GLARGINE 100 UNIT/ML ~~LOC~~ SOLN
10.0000 [IU] | SUBCUTANEOUS | Status: DC
  Administered 2016-08-29 – 2016-09-05 (×9): 10 [IU] via SUBCUTANEOUS
  Filled 2016-08-29 (×9): qty 0.1

## 2016-08-29 MED ORDER — ACETAMINOPHEN 160 MG/5ML PO SOLN
650.0000 mg | Freq: Four times a day (QID) | ORAL | Status: AC
  Administered 2016-08-29 (×4): 650 mg
  Filled 2016-08-29 (×6): qty 20.3

## 2016-08-29 MED ORDER — INSULIN ASPART 100 UNIT/ML ~~LOC~~ SOLN: 0.0000 [IU] | SUBCUTANEOUS | Status: DC

## 2016-08-29 MED ORDER — SODIUM CHLORIDE 0.9 % IV SOLN
1.0000 mg/h | INTRAVENOUS | Status: DC
  Administered 2016-08-29: 5 mg/h via INTRAVENOUS
  Administered 2016-08-29: 1 mg/h via INTRAVENOUS
  Administered 2016-08-29: 5 mg/h via INTRAVENOUS
  Administered 2016-08-30: 6 mg/h via INTRAVENOUS
  Administered 2016-08-30: 5 mg/h via INTRAVENOUS
  Administered 2016-08-31: 1 mg/h via INTRAVENOUS
  Filled 2016-08-29 (×7): qty 10

## 2016-08-29 MED ORDER — MIDAZOLAM BOLUS VIA INFUSION
1.0000 mg | INTRAVENOUS | Status: DC | PRN
  Filled 2016-08-29: qty 4

## 2016-08-29 NOTE — Progress Notes (Signed)
eLink Physician-Brief Progress Note Patient Name: Cheryl Wheeler DOB: [birth year] MRN: [id number]   Date of Service  08/29/2016  HPI/Events of Note  Contacted by bedside nurse regarding ongoing hypertension, tachypnea, tachycardia, and patient report of pain. Currently on propofol infusion. Versed IV push ordered but of limited effect. Patient reportedly has shortness of breath with fentanyl. Question if this is a true allergy. Toradol started earlier by eMD. also note patient is currently on standard sliding scale with tube feeds and persistent hyperglycemia with escalating blood glucose. No history of diabetes mellitus.   eICU Interventions  1. Continuing Toradol IV when necessary 2. Scheduling Tylenol via tube every 6 hours 4 doses 3. Starting Lantus 10 units subcutaneous every 24 hours first dose now 4. Continuing Accu-Cheks every 4 hours 5. Increasing to resistant sliding scale algorithm 6. Checking hemoglobin A1c 7. Starting Versed drip with IV pushes via drip as needed  8. Desired RASS 0 to -1     Intervention Category Intermediate Interventions: Hyperglycemia - evaluation and treatment;Pain - evaluation and management  Lawanda CousinsJennings Trudi Morgenthaler 08/29/2016, 12:52 AM

## 2016-08-29 NOTE — Progress Notes (Signed)
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  On 08/22/2016, [facility] with dyspnea, dry cough and subjective fever and hypoxia to 70's. Initial AB[facility] 7.4/66/45/[facility] CBC significant for mild leukocytosis. BMP remarkable for hypoosmolar hyponatremia to 128 otherwise within normal limits. UA was remarkable for pyuria but urine culture was negative. She was started on Solu-Medrol, DuoNeb, Pulmicort and guanfacine-DM for COPD exacerbation. However, she continued to have worsening dyspnea with increased oxygen requirement on BiPAP continuously. She was also started on cefepime and vancomycin on 08/24/2016.  On the day of transfer, vitals and was normal limits except for tachypnea to 25, ABG was 7.4/65/75/41/ Last BMP within normal limits. Mild leukocytosis and hyponatremia resolved.   On arrival, patient is on BiPAP with some increased work of breathing. She reports headache at the site where the the BiPAP mask rests on her forehead, denies chest pain, admits shortness of breath, denies abdominal pain, denies dysuria and admits chronic back pain. She reports swelling in the right foot and right calf for about a month prior to presentation to [facility] that has resolved.  Patient likes to have intubation or trach done.   SIGNIFICANT EVENTS: 08/22/2016: [facility] where his acute on chronic COPD exacerbation and hypoosmolar hyponatremia 08/24/2016: Started on vancomycin and  cefepime 08/26/2016: [facility] due to continuous need for BiPAP and increased oxygen requirement.  08/27/2016: Intubated. CT chest negative for PE.  11/18- Bronch RML, no DAH, some erthyema blood, BAL sent, tolearted 11/19- versed drip required  SUBJECTIVE:  Agitation , versed   VITAL SIGNS: BP (!) 168/67   Pulse (!) 123   Temp 98.2 F (36.8 C) (Oral)   Resp (!) 29   Ht 5' (1.524 m)   Wt 85.8 kg (189 lb 2.5 oz)   SpO2 93%   BMI 36.94 kg/m   HEMODYNAMICS:    VENTILATOR SETTINGS: Vent Mode: PRVC FiO2 (%):  [40 %] 40 % Set Rate:  [24 bmp] 24 bmp Vt Set:  [360 mL] 360 mL PEEP:  [5 cmH20] 5 cmH20 Plateau Pressure:  [17 cmH20-24 cmH20] 23 cmH20  INTAKE / OUTPUT: I/O last 3 completed shifts: In: 3026.8 [I.V.:1911.8; NG/GT:960; IV Piggyback:155] Out: 1195 [Urine:1080; Emesis/NG output:115]  PHYSICAL EXAMINATION: GEN: sedated, lying in bed  Oropharynx: jvd hard to tell,  obese CVS: s1 s2 RRT RESP:  reduced cta GI: Bowel sounds present and normal, soft, non-tender,non-distended GU: foley in place NEURO: rass -1, fc   LABS:  BMET  Recent Labs Lab 08/27/16 0510 08/28/16 0536 08/29/16 0213  NA 141 143 143  K 3.8 3.5 4.0  CL 96* 101 100*  CO2 36* 33* 34*  BUN 20 23* 28*  CREATININE 0.47 0.42* 0.51  GLUCOSE 169* 209* 237*    Electrolytes  Recent Labs Lab 08/27/16 0510 08/28/16 0536 08/29/16 0213  CALCIUM 9.1 8.7* 9.1  MG 2.5* 2.6* 2.4  PHOS 5.0* 2.9 3.6    CBC  Recent Labs Lab 08/27/16 0805 08/28/16 0536 08/29/16 0249  WBC  9.7 7.2 7.6  HGB 11.3* 9.9* 10.3*  HCT 38.9 34.5* 36.4  PLT 225 201 191    Coag's No results for input(s): APTT, INR in the last 168 hours.  Sepsis Markers  Recent Labs Lab 08/26/16 1804 08/26/16 2028 08/28/16 0947 08/29/16 0213  LATICACIDVEN 0.9 0.8  --   --   PROCALCITON <0.10  --  <0.10 <0.10    ABG  Recent Labs Lab 08/27/16 0840 08/27/16 1343 08/28/16 0753   PHART 7.347* 7.381 7.435  PCO2ART 70.5* 63.8* 58.7*  PO2ART 105 104 82.0*    Liver Enzymes  Recent Labs Lab 08/26/16 1804  AST 12*  ALT 14  ALKPHOS 45  BILITOT 0.9  ALBUMIN 3.5    Cardiac Enzymes  Recent Labs Lab 08/26/16 1804 08/26/16 2313 08/27/16 0510  TROPONINI 0.04* <0.03 <0.03    Glucose  Recent Labs Lab 08/28/16 1231 08/28/16 1629 08/28/16 2014 08/28/16 2353 08/29/16 0402 08/29/16 0807  GLUCAP 213* 188* 198* 266* 184* 210*    Imaging No results found.   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   ASSESSMENT / PLAN:  PULMONARY A: ? End-stage COPD on 3 L & nocturnal BiPAP Acute on chronic respiratory failure RML infiltrate subtle Preliminary LE doppler results: No DVT Husband gives history Wegener's? P:   Mechanical Ventilation  DuoNeb q6h  Albuterol nebs q2h prn  Steroids, slight reduction Follow anca, auto immune owrk up Rate 20, SBT today, ps 10 appears to be needed If fails, may need early trach, requested also by patient Even to slight neg balance goals  CARDIOVASCULAR A:  Elevated BNP to 168. No pulm congestion Troponins negative  HTN P:  Labetalol to oral and IV prn, increase oral Tele  RENAL A:   R/o [place] uti Hypokalemia  P: kvo today  GASTROINTESTINAL A:   GERD P:   Pepcid TFs  HEMATOLOGIC A:   Chronic granulomatous disease Anemia  P:  Lovenox for DVT prophylaxis SCD  INFECTIOUS A:   R/o [facility]TI [facility]16 CXR with ?worsening of  LLL haziness, CT chest with concern for RML PNA, autoimmune ( subtle) P:   Follow Bronch bal result If spike, add meropenem Pct neg x 2 with LOW clinical suspicion PNA- no ABX  ENDOCRINE A:   At risk for hyperglycemia  P:   Moderate SSI  lantus  NEUROLOGIC A:   Bipolar disorder Anxiety Chronic back pain P:   Valproic acid 500 mg twice a day  Re-started home Risperidone and Remeron [facility] chair  FAMILY  - Updates:called husband 11/18  - Inter-disciplinary family meet or Palliative Care meeting due by:  day 7 of admission  Ccm time 30 min   Mcarthur Rossettianiel J. Tyson AliasFeinstein, MD, FACP Pgr: [phone] [place] [facility] 08/29/2016 8:25 AM

## 2016-08-29 NOTE — Progress Notes (Signed)
E-link nurse notified about patients increase agitation, hypertension and increase work of breathing. MD will be notified.

## 2016-08-29 NOTE — Progress Notes (Signed)
Pt placed back on full vent support due to RR at 48, and sat 87%. RN and MD notified.

## 2016-08-30 ENCOUNTER — Inpatient Hospital Stay (HOSPITAL_COMMUNITY): Payer: Medicare HMO

## 2016-08-30 DIAGNOSIS — J181 Lobar pneumonia, unspecified organism: Secondary | ICD-10-CM

## 2016-08-30 DIAGNOSIS — J189 Pneumonia, unspecified organism: Secondary | ICD-10-CM | POA: Diagnosis present

## 2016-08-30 LAB — HEMOGLOBIN A1C
Hgb A1c MFr Bld: 6.5 % — ABNORMAL HIGH (ref 4.8–5.6)
Mean Plasma Glucose: 140 mg/dL

## 2016-08-30 LAB — GLUCOSE, CAPILLARY
GLUCOSE-CAPILLARY: 176 mg/dL — AB (ref 65–99)
Glucose-Capillary: 119 mg/dL — ABNORMAL HIGH (ref 65–99)
Glucose-Capillary: 168 mg/dL — ABNORMAL HIGH (ref 65–99)
Glucose-Capillary: 170 mg/dL — ABNORMAL HIGH (ref 65–99)
Glucose-Capillary: 177 mg/dL — ABNORMAL HIGH (ref 65–99)
Glucose-Capillary: 215 mg/dL — ABNORMAL HIGH (ref 65–99)
Glucose-Capillary: 230 mg/dL — ABNORMAL HIGH (ref 65–99)

## 2016-08-30 LAB — CBC WITH DIFFERENTIAL/PLATELET
Basophils Absolute: 0 10*3/uL (ref 0.0–0.1)
Basophils Relative: 0 %
Eosinophils Absolute: 0 10*3/uL (ref 0.0–0.7)
Eosinophils Relative: 0 %
HEMATOCRIT: 37.4 % (ref 36.0–46.0)
Hemoglobin: 10.6 g/dL — ABNORMAL LOW (ref 12.0–15.0)
LYMPHS ABS: 0.4 10*3/uL — AB (ref 0.7–4.0)
LYMPHS PCT: 4 %
MCH: 25.1 pg — AB (ref 26.0–34.0)
MCHC: 28.3 g/dL — AB (ref 30.0–36.0)
MCV: 88.6 fL (ref 78.0–100.0)
MONO ABS: 0.4 10*3/uL (ref 0.1–1.0)
MONOS PCT: 5 %
NEUTROS ABS: 7.7 10*3/uL (ref 1.7–7.7)
Neutrophils Relative %: 91 %
Platelets: 181 10*3/uL (ref 150–400)
RBC: 4.22 MIL/uL (ref 3.87–5.11)
RDW: 16.2 % — AB (ref 11.5–15.5)
WBC: 8.4 10*3/uL (ref 4.0–10.5)

## 2016-08-30 LAB — CULTURE, RESPIRATORY: CULTURE: NORMAL

## 2016-08-30 LAB — BASIC METABOLIC PANEL
Anion gap: 8 (ref 5–15)
BUN: 29 mg/dL — ABNORMAL HIGH (ref 6–20)
CALCIUM: 9 mg/dL (ref 8.9–10.3)
CO2: 34 mmol/L — AB (ref 22–32)
CREATININE: 0.37 mg/dL — AB (ref 0.44–1.00)
Chloride: 101 mmol/L (ref 101–111)
GFR calc non Af Amer: >60 mL/min (ref >60)
GLUCOSE: 207 mg/dL — AB (ref 65–99)
Potassium: 4.3 mmol/L (ref 3.5–5.1)
Sodium: 143 mmol/L (ref 135–145)

## 2016-08-30 LAB — MISC LABCORP TEST (SEND OUT): LABCORP TEST CODE: 16457

## 2016-08-30 LAB — ALPHA-1 ANTITRYPSIN PHENOTYPE: A1 ANTITRYPSIN SER: 120 mg/dL (ref 90–200)

## 2016-08-30 LAB — MAGNESIUM: Magnesium: 2.5 mg/dL — ABNORMAL HIGH (ref 1.7–2.4)

## 2016-08-30 LAB — PROCALCITONIN: Procalcitonin: <0.1 ng/mL

## 2016-08-30 LAB — ANTINUCLEAR ANTIBODIES, IFA: ANA Ab, IFA: NEGATIVE

## 2016-08-30 LAB — PHOSPHORUS: PHOSPHORUS: 3.3 mg/dL (ref 2.5–4.6)

## 2016-08-30 MED ORDER — ONDANSETRON HCL 4 MG/2ML IJ SOLN
4.0000 mg | Freq: Four times a day (QID) | INTRAMUSCULAR | Status: DC
  Administered 2016-08-30: 4 mg via INTRAVENOUS
  Filled 2016-08-30: qty 2

## 2016-08-30 MED ORDER — METHYLPREDNISOLONE SODIUM SUCC 40 MG IJ SOLR
30.0000 mg | Freq: Three times a day (TID) | INTRAMUSCULAR | Status: DC
  Administered 2016-08-30 – 2016-09-01 (×6): 30 mg via INTRAVENOUS
  Filled 2016-08-30 (×7): qty 0.75

## 2016-08-30 MED ORDER — HYDROMORPHONE HCL 1 MG/ML IJ SOLN
0.5000 mg | INTRAMUSCULAR | Status: DC | PRN
  Administered 2016-08-30 – 2016-09-05 (×13): 1 mg via INTRAVENOUS
  Filled 2016-08-30 (×14): qty 1

## 2016-08-30 MED ORDER — ONDANSETRON HCL 4 MG/2ML IJ SOLN: 4.0000 mg | Freq: Four times a day (QID) | INTRAMUSCULAR | Status: DC | PRN

## 2016-08-30 MED ORDER — BUDESONIDE 0.5 MG/2ML IN SUSP
0.5000 mg | Freq: Two times a day (BID) | RESPIRATORY_TRACT | Status: DC
  Administered 2016-08-30 – 2016-09-09 (×21): 0.5 mg via RESPIRATORY_TRACT
  Filled 2016-08-30 (×22): qty 2

## 2016-08-30 MED ORDER — FUROSEMIDE 10 MG/ML IJ SOLN
40.0000 mg | Freq: Once | INTRAMUSCULAR | Status: AC
  Administered 2016-08-30: 40 mg via INTRAVENOUS
  Filled 2016-08-30: qty 4

## 2016-08-30 MED ORDER — INSULIN ASPART 100 UNIT/ML ~~LOC~~ SOLN
2.0000 [IU] | SUBCUTANEOUS | Status: DC
Start: 2016-08-30 — End: 2016-09-03
  Administered 2016-08-30 – 2016-09-03 (×21): 2 [IU] via SUBCUTANEOUS

## 2016-08-30 NOTE — Progress Notes (Signed)
Spoke with patient's husband, Cheryl Wheeler, to give him an update about the patient. He voiced understanding about the plan for the tracheostomy. Stated that his wife voiced desire to have the tracheostomy prior to being intubated. Husband is in agreement with the tracheostomy. He plans to be present tomorrow morning, 11/21 to give consent and to be present for day of procedure.   Husband notes other pertinent family members:  Tyrone AppleJustin Richardson- son Astrid DivineHailey Williams - sister   Husband plans to update these family members today but has given permission for these family members to be updated by MICU staff.   Marcy Sirenatherine Mackinley Kiehn, D.O. 08/30/2016, 11:44 AM PGY-2, [facility]

## 2016-08-30 NOTE — Care Management Important Message (Signed)
Important Message  Patient Details  Name: Cheryl Wheeler MRN: [id number] Date of Birth: [birth year]   Medicare Important Message Given:  Yes    [name], [name] 08/30/2016, 10:19 AM

## 2016-08-30 NOTE — Progress Notes (Signed)
Inpatient Diabetes Program Recommendations  AACE/ADA: New Consensus Statement on Inpatient Glycemic Control (2015)  Target Ranges:  Prepandial:   less than 140 mg/dL      Peak postprandial:   less than 180 mg/dL (1-2 hours)      Critically ill patients:  140 - 180 mg/dL   Results for Marshall CorkROGERS, Sai [facility] (MRN [id number]) as of 08/30/2016 09:17  Ref. Range 08/29/2016 08:07 08/29/2016 12:11 08/29/2016 16:37 08/29/2016 20:13 08/30/2016 00:05 08/30/2016 04:22 08/30/2016 07:32  Glucose-Capillary Latest Ref Range: 65 - 99 mg/dL 213210 (H) 086220 (H) 578147 (H) 191 (H) 230 (H) 177 (H) 170 (H)   No A1C on file, A1C pending this admission, will continue to follow  Review of Glycemic Control  Diabetes history:       DM2, steroid use this admission, BUN elevated, Creatinine & GFR WNL Outpatient Diabetes medications:       None based upon Pulmonology OP [facility] list of medications Current orders for Inpatient glycemic control:       Novolog 0-20 Q4H, Novolog 2 units Q4H, Lantus 10 units every 24 hours  Inpatient Diabetes Program Recommendations:      If prefer to keep patient off IV insulin, please consider increasing Lantus to 15 units daily (86Kg x .17 units).  Thank you,  Kristine LineaKaren Haydyn Liddell, RN, BSN Diabetes Coordinator Inpatient Diabetes Program [phone] (Team Pager)

## 2016-08-30 NOTE — Progress Notes (Signed)
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  On 08/22/2016, patient presented to [facility] with dyspnea, dry cough and subjective fever and hypoxia to 70's. Initial ABG was 7.4/66/45/41 [facility]. CBC significant for mild leukocytosis. BMP remarkable for hypoosmolar hyponatremia to 128 otherwise within normal limits. UA was remarkable for pyuria but urine culture was negative. She was started on Solu-Medrol, DuoNeb, Pulmicort and guanfacine-DM for COPD exacerbation. However, she continued to have worsening dyspnea with increased oxygen requirement on BiPAP continuously. She was also started on cefepime and vancomycin on 08/24/2016.  On the day of transfer, vitals and was normal limits except for tachypnea to 25, ABG was 7.4/65/75/41/ Last BMP within normal limits. Mild leukocytosis and hyponatremia resolved.   On arrival, patient is on BiPAP with some increased work of breathing. She reports headache at the site where the the BiPAP mask rests on her forehead, denies chest pain, admits shortness of breath, denies abdominal pain, denies dysuria and admits chronic back pain. She reports swelling in the right foot and right calf for about a month prior to presentation to [facility] that has resolved.  Patient likes to have intubation or trach done.   SIGNIFICANT EVENTS: 08/22/2016: Ad[facility] where his acute on chronic COPD exacerbation and hypoosmolar hyponatremia 08/24/2016: Started on vancomycin and  cefepime 08/26/2016: Transferred to [facility] for possible tracheostomy due to continuous need for BiPAP and increased oxygen requirement.  08/27/2016: Intubated. CT chest negative for PE.  11/18- Bronch RML, no DAH, some erthyema blood, BAL sent, tolerated 11/19- versed drip required  SUBJECTIVE:  Sedated. No acute events overnight.    VITAL SIGNS: BP (!) 169/70   Pulse (!) 108   Temp 98.3 F (36.8 C) (Oral)   Resp (!) 31   Ht 5' (1.524 m)   Wt 189 lb 13.1 oz (86.1 kg)   SpO2 95%   BMI 37.07 kg/m   HEMODYNAMICS:    VENTILATOR SETTINGS: Vent Mode: PRVC FiO2 (%):  [40 %] 40 % Set Rate:  [20 bmp-24 bmp] 20 bmp Vt Set:  [360 mL] 360 mL PEEP:  [5 cmH20] 5 cmH20 Pressure Support:  [10 cmH20] 10 cmH20 Plateau Pressure:  [18 cmH20-29 cmH20] 29 cmH20  INTAKE / OUTPUT: I/O last 3 completed shifts: In: 1555.2 [I.V.:685.2; NG/GT:870] Out: 600 [Urine:540; Emesis/NG output:60]  PHYSICAL EXAMINATION: GEN: sedated, lying in bed  Oropharynx: jvd hard to tell,  obese CVS: s1 s2 RRT RESP:  reduced cta GI: Bowel sounds present and normal, soft, non-tender,non-distended GU: foley in place NEURO: rass -4, not fc, not tracking    LABS:  BMET  Recent Labs Lab 08/29/16 0213 08/29/16 0900 08/30/16 0151  NA 143 145 143  K 4.0 4.0 4.3  CL 100* 101 101  CO2 34* 35* 34*  BUN 28* 29* 29*  CREATININE 0.51 0.46 0.37*  GLUCOSE 237* 218* 207*    Electrolytes  Recent Labs Lab 08/28/16 0536 08/29/16 0213 08/29/16 0900 08/30/16 0151  CALCIUM 8.7* 9.1 8.9 9.0  MG 2.6* 2.4  --  2.5*  PHOS  2.9 3.6  --  3.3    CBC  Recent Labs Lab 08/28/16 0536 08/29/16 0249 08/30/16 0151  WBC 7.2 7.6 8.4  HGB 9.9* 10.3* 10.6*  HCT 34.5* 36.4 37.4  PLT 201 191 181    Coag's No results for input(s): APTT, INR in the last 168 hours.  Sepsis Markers  Recent Labs Lab 08/26/16 1804 08/26/16 2028 08/28/16 0947 08/29/16 0213 08/30/16 0151  LATICACIDVEN 0.9 0.8  --   --   --    PROCALCITON <0.10  --  <0.10 <0.10 <0.10    ABG  Recent Labs Lab 08/27/16 0840 08/27/16 1343 08/28/16 0753  PHART 7.347* 7.381 7.435  PCO2ART 70.5* 63.8* 58.7*  PO2ART 105 104 82.0*    Liver Enzymes  Recent Labs Lab 08/26/16 1804  AST 12*  ALT 14  ALKPHOS 45  BILITOT 0.9  ALBUMIN 3.5    Cardiac Enzymes  Recent Labs Lab 08/26/16 1804 08/26/16 2313 08/27/16 0510  TROPONINI 0.04* <0.03 <0.03    Glucose  Recent Labs Lab 08/29/16 0807 08/29/16 1211 08/29/16 1637 08/29/16 2013 08/30/16 0005 08/30/16 0422  GLUCAP 210* 220* 147* 191* 230* 177*    Imaging Dg Chest Port 1 View  Result Date: 08/30/2016 CLINICAL DATA:  COPD, pneumonia, acute respiratory failure. EXAM: PORTABLE CHEST 1 VIEW COMPARISON:  Portable chest x-ray of August 28, 2016 FINDINGS: The lungs are hyperinflated. The interstitial markings are coarse in the left lower lung field. The heart is normal in size. The pulmonary vascularity is not engorged. The mediastinum is normal in width. The endotracheal tube tip lies 2.7 cm above the carina. The esophagogastric tube tip projects below the inferior margin of the image. IMPRESSION: COPD. Left lower lobe subsegmental atelectasis or mild pneumonia. No CHF. No significant change since yesterday's study. Electronically Signed   By: David  [place] M.D.   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   ASSESSMENT / PLAN:  PULMONARY A: ?  End-stage COPD on 3 L & nocturnal BiPAP Acute on chronic respiratory failure RML infiltrate subtle No PE or DVT  Husband gives history Wegener's? P:   Mechanical Ventilation  DuoNeb q6h  Albuterol nebs q2h prn  Steroids: Solumedrol 60 mg q8h  Follow anca, auto immune owrk up If fails, may need early trach, requested also by patient   CARDIOVASCULAR A:  Elevated BNP to 168. No pulm congestion Troponins negative  HTN P:  Labetalol oral 300 mg BID and IV prn Tele  RENAL A:   Nil acute  P: kvo   GASTROINTESTINAL A:   GERD P:   Pepcid TFs  HEMATOLOGIC A:   Chronic granulomatous disease Anemia P:  Lovenox for DVT prophylaxis SCD  INFECTIOUS A:   Cefepime & Vanc at [facility] 11/14>11/16 CXR with ?worsening of LLL haziness, CT chest with concern for RML PNA, autoimmune ( subtle) P:   Follow Bronch bal result >> re-incubated for better growth  If spike, add meropenem Pct neg x 2 with LOW clinical suspicion  PNA- no ABX  ENDOCRINE A:   At risk for hyperglycemia  P:   Resistant SSI  lantus Start TF coverage with Novolog 2u q4h   NEUROLOGIC A:   Bipolar disorder Anxiety Chronic back pain P:   Valproic acid 500 mg twice a day  Home Risperidone and Remeron Propofol and Versed   FAMILY  - Updates: no family at bedside   - Inter-disciplinary family meet or Palliative Care meeting due by:  day 7 of admission  Marcy Sirenatherine Wallace, D.O. 08/30/2016, 10:06 AM PGY-2, [facility]   ATTENDING NOTE / ATTESTATION NOTE :   I have discussed the case with the resident/APP  Marcy Sirenatherine Wallace.   I agree with the resident/APP's  history, physical examination, assessment, and plans.    I have edited the above note and modified it according to our agreed history, physical examination, assessment and plan.   Briefly, pt admitted for AoC hypoxemic hypercapneic resp fx 2/2 RUL/RML PNA and severe AECOPD. Failed miserabvly BipaP and tracheostomy was  recommended and pt agreed to it prior to intubation. PE as above. VSS. Sedated, intubated. Was tachpneic earlier, trying to pull ETT. Rhonchi at bases. Gr 1 edema.   Acute hypoxemic hypercapneic resp fx 2/2 CAP RML, severe AECOPD, failure of Bipap.  Cont vent support. Need to d/w husband re: trache. Finished cefepime and bvanc x 3 days. PCT N.  Check echo. May need diuresis. Cont other meds. Decrease steroids.   I have spent 30  minutes of critical care time with this patient today.  Family :  No family at bedside.    Pollie MeyerJ. Angelo A de Dios, MD 08/30/2016, 10:08 AM [place] [facility] Pager [phone] After 3 pm or if no answer, call [phone]

## 2016-08-30 NOTE — Care Management Note (Signed)
Case Management Note  Patient Details  Name: Cheryl Wheeler Ann Tutterow MRN: [id number] Date of Birth: 12-19-1965  Subjective/Objective:    Pt admitted with acute resp failure with hypoxia - pt is on the ventilator                Action/Plan:   PTA from home with husband.  Per husband and daughter at bedside; pt uses walker and wheelchair if needed in the home and is independent with ADLs.  Pt is on home oxygen 2 liters supplied by [employer] and husband has portable oxygen tank.  Pt also has shower chair - family denied needing additional equipment in the home.     Expected Discharge Date:                  Expected Discharge Plan:  Home w Home Health Services  In-House Referral:     Discharge planning Services  CM Consult  Post Acute Care Choice:    Choice offered to:     DME Arranged:    DME Agency:     HH Arranged:    HH Agency:     Status of Service:  In process, will continue to follow  If discussed at Long Length of Stay Meetings, dates discussed:    Additional Comments 08/30/2016 CM spoke with attending today.   Pt is tentatively scheduled for trach tomorrow 08/12/16 - CM also spoke with husband post his discussion with attending earlier this am.  Husband now wants to speak directly with attending prior to moving forward with trach.  Pt stated he still understood that pt will very likely require acute facility and would decide on where post the trach.  Pt has been on BIPAP at night since Feb in the home - an apparently failed [facility] and required intubation - strong resp history - pt maybe appropriate for LTACH - CM will discuss with physician advisor.   CM  Will  continue to follow for discharge needs  08/27/16 Per rounds; pt is tenatively scheduled for trach on Monday 08/30/16 - CM discussed discharge options with pt including LTACH, SNF and home if pt still requires supplemental oxygen/acute care when stable for discharge from hospital Cherylann ParrClaxton, Jezabelle Chisolm S,  RN 08/30/2016, 2:19 PM

## 2016-08-30 NOTE — Progress Notes (Addendum)
Patient with very minimal urine output, around 30cc's documented by previous RN for shift thus far. Bladder scan reveals >999 in bladder. Attempted to flush foley, catheter completely occluded. Looks like thick milky sediment in catheter tubing. Urine noted to be Cloudy with Sediment on admission with foley from [place]. Patient also with systolic blood pressures in the 190's. MD notified, order to replace catheter received. New foley placed by Lauren RN and 1345cc's of tea colored urine returned. Once bladder was decompressed, patients blood pressure returned to normal. Will continue to monitor.

## 2016-08-31 ENCOUNTER — Inpatient Hospital Stay (HOSPITAL_COMMUNITY): Payer: Medicare HMO

## 2016-08-31 DIAGNOSIS — J810 Acute pulmonary edema: Secondary | ICD-10-CM

## 2016-08-31 LAB — BASIC METABOLIC PANEL
Anion gap: 9 (ref 5–15)
Anion gap: 7 (ref 5–15)
BUN: 27 mg/dL — AB (ref 6–20)
BUN: 23 mg/dL — AB (ref 6–20)
CALCIUM: 9.1 mg/dL (ref 8.9–10.3)
CHLORIDE: 101 mmol/L (ref 101–111)
CHLORIDE: 97 mmol/L — AB (ref 101–111)
CO2: 34 mmol/L — AB (ref 22–32)
CO2: 42 mmol/L — ABNORMAL HIGH (ref 22–32)
CREATININE: 0.33 mg/dL — AB (ref 0.44–1.00)
CREATININE: 0.38 mg/dL — AB (ref 0.44–1.00)
Calcium: 8.7 mg/dL — ABNORMAL LOW (ref 8.9–10.3)
GFR calc Af Amer: >60 mL/min (ref >60)
GFR calc non Af Amer: >60 mL/min (ref >60)
GFR calc non Af Amer: >60 mL/min (ref >60)
GLUCOSE: 151 mg/dL — AB (ref 65–99)
Glucose, Bld: 195 mg/dL — ABNORMAL HIGH (ref 65–99)
POTASSIUM: 5.2 mmol/L — AB (ref 3.5–5.1)
Potassium: 4.3 mmol/L (ref 3.5–5.1)
SODIUM: 144 mmol/L (ref 135–145)
SODIUM: 146 mmol/L — AB (ref 135–145)

## 2016-08-31 LAB — CBC
HCT: 40.2 % (ref 36.0–46.0)
HEMATOCRIT: 35.3 % — AB (ref 36.0–46.0)
HEMOGLOBIN: 10 g/dL — AB (ref 12.0–15.0)
Hemoglobin: 11 g/dL — ABNORMAL LOW (ref 12.0–15.0)
MCH: 25.3 pg — AB (ref 26.0–34.0)
MCH: 24.8 pg — ABNORMAL LOW (ref 26.0–34.0)
MCHC: 28.3 g/dL — AB (ref 30.0–36.0)
MCHC: 27.4 g/dL — ABNORMAL LOW (ref 30.0–36.0)
MCV: 89.4 fL (ref 78.0–100.0)
MCV: 90.7 fL (ref 78.0–100.0)
PLATELETS: 203 10*3/uL (ref 150–400)
Platelets: 152 10*3/uL (ref 150–400)
RBC: 3.95 MIL/uL (ref 3.87–5.11)
RBC: 4.43 MIL/uL (ref 3.87–5.11)
RDW: 16.5 % — ABNORMAL HIGH (ref 11.5–15.5)
RDW: 16.4 % — AB (ref 11.5–15.5)
WBC: 9.2 10*3/uL (ref 4.0–10.5)
WBC: 10.8 10*3/uL — AB (ref 4.0–10.5)

## 2016-08-31 LAB — GLUCOSE, CAPILLARY
GLUCOSE-CAPILLARY: 157 mg/dL — AB (ref 65–99)
GLUCOSE-CAPILLARY: 167 mg/dL — AB (ref 65–99)
GLUCOSE-CAPILLARY: 94 mg/dL (ref 65–99)
Glucose-Capillary: 151 mg/dL — ABNORMAL HIGH (ref 65–99)
Glucose-Capillary: 152 mg/dL — ABNORMAL HIGH (ref 65–99)

## 2016-08-31 LAB — URINALYSIS, ROUTINE W REFLEX MICROSCOPIC
Bilirubin Urine: NEGATIVE
GLUCOSE, UA: NEGATIVE mg/dL
Hgb urine dipstick: NEGATIVE
KETONES UR: NEGATIVE mg/dL
NITRITE: NEGATIVE
PROTEIN: NEGATIVE mg/dL
Specific Gravity, Urine: 1.025 (ref 1.005–1.030)
pH: 7.5 (ref 5.0–8.0)

## 2016-08-31 LAB — RESPIRATORY VIRUS PANEL
Adenovirus: NEGATIVE
INFLUENZA B 1: NEGATIVE
Influenza A: NEGATIVE
Metapneumovirus: NEGATIVE
PARAINFLUENZA 1 A: NEGATIVE
PARAINFLUENZA 2 A: NEGATIVE
Parainfluenza 3: NEGATIVE
RESPIRATORY SYNCYTIAL VIRUS B: POSITIVE — AB
Respiratory Syncytial Virus A: NEGATIVE
Rhinovirus: NEGATIVE

## 2016-08-31 LAB — ECHOCARDIOGRAM COMPLETE
HEIGHTINCHES: 60 in
WEIGHTICAEL: 2977.09 [oz_av]

## 2016-08-31 LAB — URINE MICROSCOPIC-ADD ON

## 2016-08-31 LAB — MAGNESIUM: Magnesium: 2.3 mg/dL (ref 1.7–2.4)

## 2016-08-31 LAB — PHOSPHORUS: Phosphorus: 4.1 mg/dL (ref 2.5–4.6)

## 2016-08-31 MED ORDER — FUROSEMIDE 10 MG/ML IJ SOLN
40.0000 mg | Freq: Every day | INTRAMUSCULAR | Status: DC
  Filled 2016-08-31: qty 4

## 2016-08-31 MED ORDER — FUROSEMIDE 10 MG/ML IJ SOLN
40.0000 mg | Freq: Once | INTRAMUSCULAR | Status: AC
  Administered 2016-08-31: 40 mg via INTRAVENOUS
  Filled 2016-08-31: qty 4

## 2016-08-31 MED ORDER — DEXTROSE 5 % IV SOLN
1.0000 g | INTRAVENOUS | Status: AC
  Administered 2016-08-31 – 2016-09-02 (×3): 1 g via INTRAVENOUS
  Filled 2016-08-31 (×3): qty 10

## 2016-08-31 MED ORDER — FREE WATER
100.0000 mL | Freq: Four times a day (QID) | Status: DC
  Administered 2016-08-31 – 2016-09-03 (×8): 100 mL

## 2016-08-31 MED ORDER — MORPHINE SULFATE 15 MG PO TABS
15.0000 mg | ORAL_TABLET | Freq: Two times a day (BID) | ORAL | Status: DC
  Administered 2016-08-31 – 2016-09-05 (×12): 15 mg via ORAL
  Filled 2016-08-31 (×13): qty 1

## 2016-08-31 MED ORDER — PERFLUTREN LIPID MICROSPHERE
1.0000 mL | INTRAVENOUS | Status: AC | PRN
  Administered 2016-08-31: 4 mL via INTRAVENOUS
  Filled 2016-08-31: qty 10

## 2016-08-31 MED ORDER — FUROSEMIDE 10 MG/ML IJ SOLN: 40.0000 mg | Freq: Every day | INTRAMUSCULAR | Status: DC

## 2016-08-31 MED ORDER — VITAL HIGH PROTEIN PO LIQD
1000.0000 mL | ORAL | Status: DC
  Administered 2016-08-31 – 2016-09-01 (×3): 1000 mL
  Administered 2016-09-02: 19:00:00
  Administered 2016-09-02: 1000 mL
  Administered 2016-09-02 (×4)
  Administered 2016-09-02: 1000 mL
  Administered 2016-09-03 (×6)

## 2016-08-31 MED ORDER — VITAL HIGH PROTEIN PO LIQD: 1000.0000 mL | ORAL | Status: DC

## 2016-08-31 NOTE — Progress Notes (Signed)
  [facility] PCCM  Per RN, pt with pyuria. With low grade fever. With elevated WBC. Will send for U/A, reflex to culture. Will start zosyn.  Pollie MeyerJ. Angelo A de Dios, MD 08/31/2016, 3:20 PM [place] [facility] Pager [phone] After 3 pm or if no answer, call [phone]

## 2016-08-31 NOTE — Progress Notes (Signed)
Nutrition Follow-up  DOCUMENTATION CODES:   Obesity unspecified  INTERVENTION:    Increase Vital High Protein to 45 ml/h and D/C Prostat.  Total intake will be 1080 kcal, 95 gm protein, 903 ml free water daily.  NUTRITION DIAGNOSIS:   Inadequate oral intake related to inability to eat as evidenced by NPO status.  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  Discussed patient in ICU rounds and with RN today. Plans to hold off on tracheostomy for a few days to see if patient can come off vent. Patient is tolerating TF well to meet nutrition needs. Patient is currently intubated on ventilator support Temp (24hrs), Avg:99.2 F (37.3 C), Min:98.6 F (37 C), Max:99.9 F (37.7 C)  Patient is currently receiving Vital High Protein via OGT at 30 ml/h (720 ml/day) with Prostat 30 ml BID to provide 920 kcals, 93 gm protein, 602 ml free water daily.  Now that Propofol is off, will adjust TF to better meet nutrition needs. Labs reviewed: sodium elevated at 146. CBG's: [id number] Medications reviewed and include Lasix, Solu-medrol, Remeron.  Diet Order:  Diet NPO time specified  Skin:  Reviewed, no issues (breakdown on cheeks from bipap)  Last BM:  08/26/16  Height:   Ht Readings from Last 1 Encounters:  08/26/16 5' (1.524 m)    Weight:   Wt Readings from Last 1 Encounters:  08/31/16 186 lb 1.1 oz (84.4 kg)    Ideal Body Weight:  45.5 kg  BMI:  Body mass index is 36.34 kg/m.  Estimated Nutritional Needs:    Kcal:  921-1941 (11-14 kcal/kg)  Protein:  >/= 91 grams (>/= 2.0 g/kg IBW)  Fluid:  per MD  EDUCATION NEEDS:   No education needs identified at this time  Molli Barrows, [place], [place], [place] Pager [phone] After Hours Pager [phone]
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
## 2016-08-31 NOTE — Progress Notes (Signed)
  Echocardiogram 2D Echocardiogram with definity has been performed.  Corra Kaine L Androw 08/31/2016, 12:54 PM

## 2016-08-31 NOTE — Care Management Note (Signed)
Case Management Note  Patient Details  Name: Cheryl Wheeler MRN: [id number] Date of Birth: 01/10/1966  Subjective/Objective:    Pt admitted with acute resp failure with hypoxia - pt is on the ventilator                Action/Plan:   PTA from home with husband.  Per husband and daughter at bedside; pt uses walker and wheelchair if needed in the home and is independent with ADLs.  Pt is on home oxygen 2 liters supplied by [employer] and husband has portable oxygen tank.  Pt also has shower chair - family denied needing additional equipment in the home.     Expected Discharge Date:                  Expected Discharge Plan:  Home w Home Health Services  In-House Referral:     Discharge planning Services  CM Consult  Post Acute Care Choice:    Choice offered to:     DME Arranged:    DME Agency:     HH Arranged:    HH Agency:     Status of Service:  In process, will continue to follow  If discussed at Long Length of Stay Meetings, dates discussed:    Additional Comments 08/31/2016  Current Plan is for pt to remain on the ventilator for 2-3 days and then revisit trach placement at that time.  CSW aware of tenative referral  CM spoke with attending today.   Pt is tentatively scheduled for trach tomorrow 08/12/16 - CM also spoke with husband post his discussion with attending earlier this am.  Husband now wants to speak directly with attending prior to moving forward with trach.  Pt stated he still understood that pt will very likely require acute facility and would decide on where post the trach.  Pt has been on BIPAP at night since Feb in the home - an apparently failed [facility] and required intubation - strong resp history - pt maybe appropriate for LTACH - CM will discuss with physician advisor.   CM  Will  continue to follow for discharge needs  08/27/16 Per rounds; pt is tenatively scheduled for trach on Monday 08/30/16 - CM discussed discharge options with pt  including LTACH, SNF and home if pt still requires supplemental oxygen/acute care when stable for discharge from hospital Cheryl Wheeler, Cheryl Rappleye S, RN 08/31/2016, 3:36 PM

## 2016-08-31 NOTE — Progress Notes (Signed)
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  On 08/22/2016, patient presented to [facility] with dyspnea, dry cough and subjective fever and hypoxia to 70's. Initial ABG was 7.4/66/45/41 at [facility]. CBC significant for mild leukocytosis. BMP remarkable for hypoosmolar hyponatremia to 128 otherwise within normal limits. UA was remarkable for pyuria but urine culture was negative. She was started on Solu-Medrol, DuoNeb, Pulmicort and guanfacine-DM for COPD exacerbation. However, she continued to have worsening dyspnea with increased oxygen requirement on BiPAP continuously. She was also started on cefepime and vancomycin on 08/24/2016.  On the day of transfer, vitals and was normal limits except for tachypnea to 25, ABG was 7.4/65/75/41/ Last BMP within normal limits. Mild leukocytosis and hyponatremia resolved.   On arrival, patient is on BiPAP with some increased work of breathing. She reports headache at the site where the the BiPAP mask rests on her forehead, denies chest pain, admits shortness of breath, denies abdominal pain, denies dysuria and admits chronic back pain. She reports swelling in the right foot and right calf for about a month prior to presentation to [facility] that has resolved.  Patient likes to have intubation or trach done.   SIGNIFICANT EVENTS: 08/22/2016: Admitted to [facility] where his acute on chronic COPD exacerbation and hypoosmolar hyponatremia 08/24/2016: Started on vancomycin and  cefepime 08/26/2016: Transferred to [facility] ICU for possible tracheostomy due to continuous need for BiPAP and increased oxygen requirement.  08/27/2016: Intubated. CT chest negative for PE.  11/18- Bronch RML, no DAH, some erthyema blood, BAL sent, tolerated 11/19- versed drip required  SUBJECTIVE:  New foley placed overnight for poor UOP and catheter occlusion. Patient hypertensive prior but once new foley placed, 1345 cc of urine returned and blood pressures normalized.   VITAL SIGNS: BP (!) 127/57   Pulse 78   Temp 99 F (37.2 C) (Oral)   Resp 20   Ht 5' (1.524 m)   Wt 186 lb 1.1 oz (84.4 kg)   SpO2 95%   BMI 36.34 kg/m   HEMODYNAMICS:    VENTILATOR SETTINGS: Vent Mode: PRVC FiO2 (%):  [40 %] 40 % Set Rate:  [20 bmp] 20 bmp Vt Set:  [360 mL] 360 mL PEEP:  [5 cmH20] 5 cmH20 Plateau Pressure:  [18 cmH20-25 cmH20] 19 cmH20  INTAKE / OUTPUT: I/O last 3 completed shifts: In: 1928.2 [I.V.:688.2; Other:40; NG/GT:1200] Out: 2650 [Urine:2650]  PHYSICAL EXAMINATION: GEN: sedated, lying in bed. Doing PST > mildly tachypneic.  Oropharynx:ETT in place,  obese CVS: s1 s2 RRT RESP:  reduced cta GI: Bowel sounds present and normal, soft, non-tender,non-distended GU: foley in place NEURO: opens eyes, follows commands, sedated    LABS:  BMET  Recent Labs Lab 08/29/16 0900 08/30/16 0151 08/31/16 0158  NA 145 143 144  K 4.0 4.3 5.2*  CL 101 101 101  CO2 35* 34* 34*  BUN 29* 29* 27*  CREATININE 0.46 0.37* 0.33*  GLUCOSE 218* 207* 151*    Electrolytes  Recent Labs Lab 08/29/16 0213 08/29/16 0900 08/30/16 0151 08/31/16 0158  CALCIUM 9.1 8.9  9.0 8.7*  MG 2.4  --  2.5* 2.3  PHOS 3.6  --  3.3 4.1    CBC  Recent Labs Lab 08/29/16 0249 08/30/16 0151 08/31/16 0158  WBC 7.6 8.4 9.2  HGB 10.3* 10.6* 10.0*  HCT 36.4 37.4 35.3*  PLT 191 181 152    Coag's No results for input(s): APTT, INR in the last 168 hours.  Sepsis Markers  Recent Labs Lab  08/26/16 1804 08/26/16 2028 08/28/16 0947 08/29/16 0213 08/30/16 0151  LATICACIDVEN 0.9 0.8  --   --   --   PROCALCITON <0.10  --  <0.10 <0.10 <0.10    ABG  Recent Labs Lab 08/27/16 0840 08/27/16 1343 08/28/16 0753  PHART 7.347* 7.381 7.435  PCO2ART 70.5* 63.8* 58.7*  PO2ART 105 104 82.0*    Liver Enzymes  Recent Labs Lab 08/26/16 1804  AST 12*  ALT 14  ALKPHOS 45  BILITOT 0.9  ALBUMIN 3.5    Cardiac Enzymes  Recent Labs Lab 08/26/16 1804 08/26/16 2313 08/27/16 0510  TROPONINI 0.04* <0.03 <0.03    Glucose  Recent Labs Lab 08/30/16 0732 08/30/16 1139 08/30/16 1528 08/30/16 1943 08/30/16 2342 08/31/16 0338  GLUCAP 170* 215* 119* 168* 176* 151*    Imaging No results found.   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 Pt known to have CGD.   ASSESSMENT / PLAN:  PULMONARY A: Acute on chronic hypoxemic hypercapneic resp fx 2/2 h/o end stage COPD, likely with OSA, OHS, RUL PNA H/o  End-stage COPD on 3 L & nocturnal BiPAP No PE or DVT  P:   Daily PST, WUA for now.  Plan was for trache but husband holding off 2/2 placement issues. Will talk to him today.  DuoNeb q6h  Albuterol nebs q2h prn  Steroids: Solumedrol 30 mg q8h  (on pred 10 mg daily) Pulmicort 0.5 mg BID Follow anca, auto immune work up > complement and RF and ESR were N. Await ANCAs.    CARDIOVASCULAR A:  Pulm edema Elevated BNP to 168.  Troponins negative  HTN P:  Labetalol oral 300 mg BID and  IV prn Tele Echo > f/u Lasix daily.   RENAL A:   Mild pulm edema P: kvo   GASTROINTESTINAL A:   GERD P:   Pepcid TFs  HEMATOLOGIC A:   Chronic granulomatous disease Anemia P:  Lovenox for DVT prophylaxis SCD  INFECTIOUS A:   Cefepime & Vanc at [facility] 11/14>11/16 CT chest with concern for RML PNA, autoimmune ( subtle) RSV B Positive  Bronch bal result: no growth  Pct neg x 2 with LOW clinical suspicion PNA- no ABX P:   Monitor fever curve and WBC  Resume bactrim for PCP prophylaxis.    ENDOCRINE A:   At risk for hyperglycemia  P:   Resistant SSI  lantus TF coverage with Novolog 2u q4h   NEUROLOGIC A:   Bipolar disorder Anxiety Chronic back pain P:   Valproic acid 500 mg twice a day  Home Risperidone and Remeron Propofol and  Versed  Agitation will be an issues with wean.   FAMILY  - Updates: no family at bedside. Will talk to husband today.   - Inter-disciplinary family meet or Palliative Care meeting due by:  day 7 of admission  Phill Myron, D.O. 08/31/2016, 7:04 AM PGY-2, [facility]  ATTENDING NOTE / ATTESTATION NOTE :   I have discussed the case with the resident/APP  Phill Myron DO.   I agree with the resident/APP's  history, physical examination, assessment, and plans.    I have edited the above note and modified it according to our agreed history, physical examination, assessment and plan.   I have spent 35  minutes of critical care time with this patient today.  Family : Plan to meet with husband today.   Monica Becton, MD 08/31/2016, 9:07 AM [facility] Pager [phone] After 3 pm or if no answer, call [phone]

## 2016-09-01 ENCOUNTER — Inpatient Hospital Stay (HOSPITAL_COMMUNITY): Payer: Medicare HMO

## 2016-09-01 LAB — GLUCOSE, CAPILLARY
GLUCOSE-CAPILLARY: 105 mg/dL — AB (ref 65–99)
GLUCOSE-CAPILLARY: 170 mg/dL — AB (ref 65–99)
GLUCOSE-CAPILLARY: 175 mg/dL — AB (ref 65–99)
Glucose-Capillary: 177 mg/dL — ABNORMAL HIGH (ref 65–99)
Glucose-Capillary: 176 mg/dL — ABNORMAL HIGH (ref 65–99)
Glucose-Capillary: 171 mg/dL — ABNORMAL HIGH (ref 65–99)

## 2016-09-01 LAB — PHOSPHORUS
PHOSPHORUS: 3.3 mg/dL (ref 2.5–4.6)
Phosphorus: 3.4 mg/dL (ref 2.5–4.6)

## 2016-09-01 LAB — CBC
HCT: 35.7 % — ABNORMAL LOW (ref 36.0–46.0)
Hemoglobin: 10.1 g/dL — ABNORMAL LOW (ref 12.0–15.0)
MCH: 25.6 pg — ABNORMAL LOW (ref 26.0–34.0)
MCHC: 28.3 g/dL — ABNORMAL LOW (ref 30.0–36.0)
MCV: 90.4 fL (ref 78.0–100.0)
PLATELETS: 181 10*3/uL (ref 150–400)
RBC: 3.95 MIL/uL (ref 3.87–5.11)
RDW: 16.6 % — AB (ref 11.5–15.5)
WBC: 8.4 10*3/uL (ref 4.0–10.5)

## 2016-09-01 LAB — BASIC METABOLIC PANEL
ANION GAP: 10 (ref 5–15)
BUN: 21 mg/dL — AB (ref 6–20)
CALCIUM: 9.1 mg/dL (ref 8.9–10.3)
CO2: 36 mmol/L — ABNORMAL HIGH (ref 22–32)
Chloride: 97 mmol/L — ABNORMAL LOW (ref 101–111)
Creatinine, Ser: <0.3 mg/dL — ABNORMAL LOW (ref 0.44–1.00)
GLUCOSE: 158 mg/dL — AB (ref 65–99)
POTASSIUM: 4.4 mmol/L (ref 3.5–5.1)
SODIUM: 143 mmol/L (ref 135–145)

## 2016-09-01 LAB — TRIGLYCERIDES: TRIGLYCERIDES: 328 mg/dL — AB (ref <150)

## 2016-09-01 LAB — MAGNESIUM: MAGNESIUM: 2.2 mg/dL (ref 1.7–2.4)

## 2016-09-01 MED ORDER — SULFAMETHOXAZOLE-TRIMETHOPRIM 200-40 MG/5ML PO SUSP
20.0000 mL | Freq: Two times a day (BID) | ORAL | Status: DC
  Administered 2016-09-01 – 2016-09-02 (×4): 20 mL
  Filled 2016-09-01 (×5): qty 20

## 2016-09-01 MED ORDER — HYDROXYZINE HCL 25 MG PO TABS
25.0000 mg | ORAL_TABLET | Freq: Four times a day (QID) | ORAL | Status: DC | PRN
  Administered 2016-09-01 – 2016-09-09 (×17): 25 mg via ORAL
  Filled 2016-09-01 (×19): qty 1

## 2016-09-01 MED ORDER — METHYLPREDNISOLONE SODIUM SUCC 40 MG IJ SOLR
15.0000 mg | Freq: Two times a day (BID) | INTRAMUSCULAR | Status: DC
  Administered 2016-09-01 – 2016-09-02 (×2): 15.2 mg via INTRAVENOUS
  Filled 2016-09-01 (×2): qty 0.38

## 2016-09-01 MED ORDER — WHITE PETROLATUM GEL
Status: AC
  Administered 2016-09-01: 16:00:00
  Filled 2016-09-01: qty 1

## 2016-09-01 MED ORDER — FUROSEMIDE 10 MG/ML IJ SOLN
40.0000 mg | Freq: Two times a day (BID) | INTRAMUSCULAR | Status: DC
  Administered 2016-09-01 – 2016-09-03 (×5): 40 mg via INTRAVENOUS
  Filled 2016-09-01 (×5): qty 4

## 2016-09-01 MED ORDER — LABETALOL HCL 200 MG PO TABS
300.0000 mg | ORAL_TABLET | Freq: Three times a day (TID) | ORAL | Status: DC
  Administered 2016-09-01 – 2016-09-06 (×16): 300 mg
  Filled 2016-09-01 (×3): qty 1
  Filled 2016-09-01 (×2): qty 2
  Filled 2016-09-01 (×4): qty 1
  Filled 2016-09-01: qty 2
  Filled 2016-09-01 (×2): qty 1
  Filled 2016-09-01: qty 2
  Filled 2016-09-01 (×3): qty 1
  Filled 2016-09-01: qty 2
  Filled 2016-09-01: qty 1

## 2016-09-01 NOTE — Progress Notes (Signed)
[facility]'s Care Every[facility] showed that patient was on Bactrim for fungal ppx against Aspergillus.   T[facility] to [facility]Caldwell[facility] [phone] for confirmation. Left a message and no returned phone calls.

## 2016-09-01 NOTE — Progress Notes (Signed)
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  On 08/22/2016, patient presented to [facility] with dyspnea, dry cough and subjective fever and hypoxia to 70's. Initial ABG was 7.4/66/45/41 at [facility]. CBC significant for mild leukocytosis. BMP remarkable for hypoosmolar hyponatremia to 128 otherwise within normal limits. UA was remarkable for pyuria but urine culture was negative. She was started on Solu-Medrol, DuoNeb, Pulmicort and guanfacine-DM for COPD exacerbation. However, she continued to have worsening dyspnea with increased oxygen requirement on BiPAP continuously. She was also started on cefepime and vancomycin on 08/24/2016.  On the day of transfer, vitals and was normal limits except for tachypnea to 25, ABG was 7.4/65/75/41/ Last BMP within normal limits. Mild leukocytosis and hyponatremia resolved.   On arrival, patient is on BiPAP with some increased work of breathing. She reports headache at the site where the the BiPAP mask rests on her forehead, denies chest pain, admits shortness of breath, denies abdominal pain, denies dysuria and admits chronic back pain. She reports swelling in the right foot and right calf for about a month prior to presentation to [facility] that has resolved.  Patient likes to have intubation or trach done.   SIGNIFICANT EVENTS: 08/22/2016: Admitted to [facility] where his acute on chronic COPD exacerbation and hypoosmolar hyponatremia 08/24/2016: Started on vancomycin and  cefepime 08/26/2016: Transferred to [facility] ICU for possible tracheostomy due to continuous need for BiPAP and increased oxygen requirement.  08/27/2016: Intubated. CT chest negative for PE.  11/18- Bronch RML, no DAH, some erthyema blood, BAL sent, tolerated 11/19- versed drip required  SUBJECTIVE:  After discussion with husband, plan to hold off on trach and re-assess need in 2-3 days. Started on Zosyn for pyuria, low grade fever, and elevated WBC.  Off sedation this morning. Attempting to wean.  Indicating she wants tube out this morning with gestures.  Doing well on PST for now  VITAL SIGNS: BP (!) 149/70   Pulse 90   Temp 98.5 F (36.9 C) (Oral)   Resp (!) 21   Ht 5' (1.524 m)   Wt 184 lb 1.4 oz (83.5 kg)   SpO2 97%   BMI 35.95 kg/m   HEMODYNAMICS:    VENTILATOR SETTINGS: Vent Mode: PRVC FiO2 (%):  [40 %] 40 % Set Rate:  [20 bmp] 20 bmp Vt Set:  [360 mL] 360 mL PEEP:  [5 cmH20] 5 cmH20 Plateau Pressure:  [16 cmH20-26 cmH20] 26 cmH20  INTAKE / OUTPUT: I/O last 3 completed shifts: In: 1715 [I.V.:169.3; Other:50; WU/JW:1191.4; IV Piggyback:50] Out: 3925 [Urine:3925]  PHYSICAL EXAMINATION: GEN: lying in bed. Awake. anxious Oropharynx:ETT in place,  obese CVS: s1 s2 RRT RESP:  Reduced. Some bibasilar crackles. (-) wheeze, rhochi GI: Bowel sounds present and normal, soft, non-tender,non-distended GU: foley in place NEURO: opens eyes, follows commands, nods in response to questions, alert    LABS:  BMET  Recent Labs Lab 08/31/16 0158 08/31/16 0929 09/01/16 0557  NA 144 146* 143  K 5.2* 4.3 4.4  CL 101 97* 97*  CO2 34* 42* 36*  BUN 27*  23* 21*  CREATININE 0.33* 0.38* <0.30*  GLUCOSE 151* 195* 158*    Electrolytes  Recent Labs Lab 08/30/16 0151 08/31/16 0158 08/31/16 0929 09/01/16 0557  CALCIUM 9.0 8.7* 9.1 9.1  MG 2.5* 2.3  --  2.2  PHOS 3.3 4.1  --  3.4    CBC  Recent Labs Lab 08/31/16 0158 08/31/16 0929 09/01/16 0557  WBC 9.2 10.8* 8.4   HGB 10.0* 11.0* 10.1*  HCT 35.3* 40.2 35.7*  PLT 152 203 181    Coag's No results for input(s): APTT, INR in the last 168 hours.  Sepsis Markers  Recent Labs Lab 08/26/16 1804 08/26/16 2028 08/28/16 0947 08/29/16 0213 08/30/16 0151  LATICACIDVEN 0.9 0.8  --   --   --   PROCALCITON <0.10  --  <0.10 <0.10 <0.10    ABG  Recent Labs Lab 08/27/16 0840 08/27/16 1343 08/28/16 0753  PHART 7.347* 7.381 7.435  PCO2ART 70.5* 63.8* 58.7*  PO2ART 105 104 82.0*    Liver Enzymes  Recent Labs Lab 08/26/16 1804  AST 12*  ALT 14  ALKPHOS 45  BILITOT 0.9  ALBUMIN 3.5    Cardiac Enzymes  Recent Labs Lab 08/26/16 1804 08/26/16 2313 08/27/16 0510  TROPONINI 0.04* <0.03 <0.03    Glucose  Recent Labs Lab 08/31/16 0747 08/31/16 1132 08/31/16 1603 08/31/16 2015 09/01/16 0013 09/01/16 0428  GLUCAP 157* 167* 94 152* 170* 177*    Imaging No results found.   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 Pt known to have CGD.   ASSESSMENT / PLAN:  PULMONARY A: Acute on chronic hypoxemic hypercapneic resp fx 2/2 h/o end stage COPD, likely with OSA, OHS, RUL PNA H/o  End-stage COPD on 3 L & nocturnal BiPAP No PE or DVT  P:   Daily PST, WUA for now. Weaning. Anticipate extubate in soon. Doing OK on PST except for anxiety.  DuoNeb q6h  Albuterol nebs q2h prn  Steroids: Solumedrol 30 mg q8h >  Will dec to 15 mg q12  (on pred 10 mg daily) Pulmicort 0.5 mg  BID Husband wants to hold off on trache if possible.    CARDIOVASCULAR A:  Pulm edema Elevated BNP to 168.  Troponins negative  HTN Echo: EF 65-70%, G1DD  P:  Labetalol oral 300 mg BID > will inc to TID as BP is elevated.  Labetalol prn.  Tele Lasix > inc to BID today > dec to daily in am.   RENAL A:   Mild pulm edema P: kvo  Lasix > inc to BID today  GASTROINTESTINAL A:   GERD P:   Pepcid TFs  HEMATOLOGIC A:   Chronic granulomatous disease Anemia P:  Lovenox for DVT prophylaxis SCD  INFECTIOUS A:   CT chest with concern for RML PNA. S/P 3 days of Abx. Clinically improved.   RSV B Positive  Concern for UTI on 11/21.  (+) Saccharomyces cerrevisae Ig G and IgA > unknown significance.   P:   Monitor fever curve and  WBC  Resume bactrim for PCP prophylaxis. 1 DS BID.  Rocephin (11/21), urine culture not sent on 11/21 > will send today.    ENDOCRINE A:   At risk for hyperglycemia  P:   Resistant SSI  lantus (started at hospital) TF coverage with Novolog 2u q4h   NEUROLOGIC A:   Bipolar disorder Anxiety Chronic back pain P:   Valproic acid 500 mg twice a day  Home Risperidone and Remeron Morphine BID  Restart hydroxyzine 25 mg q6.  Off sedation.  If extubated, will try on ativan 0.5 mg IV q8 prn.  May need precedex.   FAMILY  - Updates: no family at bedside. Will talk to husband today.   - Inter-disciplinary family meet or Palliative Care meeting due by:  day 7 of admission  Marcy Sirenatherine Wallace, D.O. 09/01/2016, 7:42 AM PGY-2, [facility]    ATTENDING NOTE / ATTESTATION NOTE :   I have discussed the case with the resident/APP   Marcy Sirenatherine Wallace DO  I agree with the resident/APP's  history, physical examination, assessment, and plans.    I have edited the above note and modified it according to our agreed history, physical examination, assessment and plan.   I have spent 30  minutes of critical care time with this  patient today.  Family : No family at bedside.    Pollie MeyerJ. Angelo A de Dios, MD 09/01/2016, 8:33 AM [place] [facility] Pager [phone] After 3 pm or if no answer, call [phone]

## 2016-09-02 ENCOUNTER — Inpatient Hospital Stay (HOSPITAL_COMMUNITY): Payer: Medicare HMO

## 2016-09-02 LAB — BASIC METABOLIC PANEL
ANION GAP: 9 (ref 5–15)
ANION GAP: 13 (ref 5–15)
BUN: 27 mg/dL — ABNORMAL HIGH (ref 6–20)
BUN: 26 mg/dL — ABNORMAL HIGH (ref 6–20)
CALCIUM: 9.5 mg/dL (ref 8.9–10.3)
CHLORIDE: 90 mmol/L — AB (ref 101–111)
CO2: 40 mmol/L — ABNORMAL HIGH (ref 22–32)
CO2: 32 mmol/L (ref 22–32)
CREATININE: 0.36 mg/dL — AB (ref 0.44–1.00)
Calcium: 9.2 mg/dL (ref 8.9–10.3)
Chloride: 93 mmol/L — ABNORMAL LOW (ref 101–111)
Creatinine, Ser: 0.34 mg/dL — ABNORMAL LOW (ref 0.44–1.00)
GFR calc Af Amer: >60 mL/min (ref >60)
GFR calc non Af Amer: >60 mL/min (ref >60)
GFR calc non Af Amer: >60 mL/min (ref >60)
Glucose, Bld: 92 mg/dL (ref 65–99)
Glucose, Bld: 186 mg/dL — ABNORMAL HIGH (ref 65–99)
POTASSIUM: 5 mmol/L (ref 3.5–5.1)
Potassium: 3.5 mmol/L (ref 3.5–5.1)
SODIUM: 142 mmol/L (ref 135–145)
SODIUM: 135 mmol/L (ref 135–145)

## 2016-09-02 LAB — GLUCOSE, CAPILLARY
GLUCOSE-CAPILLARY: 148 mg/dL — AB (ref 65–99)
GLUCOSE-CAPILLARY: 161 mg/dL — AB (ref 65–99)
GLUCOSE-CAPILLARY: 162 mg/dL — AB (ref 65–99)
GLUCOSE-CAPILLARY: 191 mg/dL — AB (ref 65–99)
Glucose-Capillary: 89 mg/dL (ref 65–99)
Glucose-Capillary: 163 mg/dL — ABNORMAL HIGH (ref 65–99)
Glucose-Capillary: 200 mg/dL — ABNORMAL HIGH (ref 65–99)

## 2016-09-02 LAB — CBC
HEMATOCRIT: 34.7 % — AB (ref 36.0–46.0)
HEMOGLOBIN: 10 g/dL — AB (ref 12.0–15.0)
MCH: 25.3 pg — ABNORMAL LOW (ref 26.0–34.0)
MCHC: 28.8 g/dL — AB (ref 30.0–36.0)
MCV: 87.6 fL (ref 78.0–100.0)
Platelets: 175 10*3/uL (ref 150–400)
RBC: 3.96 MIL/uL (ref 3.87–5.11)
RDW: 16.2 % — AB (ref 11.5–15.5)
WBC: 9.6 10*3/uL (ref 4.0–10.5)

## 2016-09-02 LAB — PHOSPHORUS: PHOSPHORUS: 4.2 mg/dL (ref 2.5–4.6)

## 2016-09-02 LAB — URINE CULTURE: Culture: 10000 — AB

## 2016-09-02 LAB — MAGNESIUM: MAGNESIUM: 2 mg/dL (ref 1.7–2.4)

## 2016-09-02 MED ORDER — DEXAMETHASONE SODIUM PHOSPHATE 4 MG/ML IJ SOLN
4.0000 mg | Freq: Four times a day (QID) | INTRAMUSCULAR | Status: AC
  Administered 2016-09-02 – 2016-09-03 (×7): 4 mg via INTRAVENOUS
  Filled 2016-09-02 (×7): qty 1

## 2016-09-02 MED ORDER — POTASSIUM CHLORIDE 20 MEQ/15ML (10%) PO SOLN
ORAL | Status: AC
  Filled 2016-09-02: qty 15

## 2016-09-02 MED ORDER — POTASSIUM CHLORIDE 20 MEQ/15ML (10%) PO SOLN
20.0000 meq | ORAL | Status: AC
  Administered 2016-09-02 (×2): 20 meq
  Filled 2016-09-02 (×2): qty 15

## 2016-09-02 NOTE — Progress Notes (Signed)
   [facility] PCCM   Doing great on PST but (-) cuff leak. Decadron x 24 hrs.  Pollie MeyerJ. Angelo A de Dios, MD 09/02/2016, 7:59 AM [place] [facility] Pager [phone] After 3 pm or if no answer, call [phone]

## 2016-09-02 NOTE — Progress Notes (Signed)
[facility] FOR AM LAB REPLACEMENT ONLY  The patient does [facility] Electrolyte [facility] Protocol based on the criteria listed below:   1. Is GFR >/= 40 ml/min? Yes.    Patient's GFR today is >60 2. Is urine output >/= 0.5 ml/kg/hr for the last 6 hours? Yes.   Patient's UOP is 1.75 ml/kg/hr 3. Is BUN < 60 mg/dL? Yes.    Patient's BUN today is 27 4. Abnormal electrolyte(s): Potassium 3.5 5. Ordered repletion with: Potassium per protocol 6. If a panic level lab has been r ported, has the CCM MD in charge been notified? No..   Physician:    Thomasenia BottomsLANTZY, Cheryl Fancher P 09/02/2016 4:43 AM

## 2016-09-02 NOTE — Progress Notes (Signed)
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  On 08/22/2016, patient presented to [facility] with dyspnea, dry cough and subjective fever and hypoxia to 70's. Initial ABG was 7.4/66/45/41 at [facility]. CBC significant for mild leukocytosis. BMP remarkable for hypoosmolar hyponatremia to 128 otherwise within normal limits. UA was remarkable for pyuria but urine culture was negative. She was started on Solu-Medrol, DuoNeb, Pulmicort and guanfacine-DM for COPD exacerbation. However, she continued to have worsening dyspnea with increased oxygen requirement on BiPAP continuously. She was also started on cefepime and vancomycin on 08/24/2016.  On the day of transfer, vitals and was normal limits except for tachypnea to 25, ABG was 7.4/65/75/41/ Last BMP within normal limits. Mild leukocytosis and hyponatremia resolved.   On arrival, patient is on BiPAP with some increased work of breathing. She reports headache at the site where the the BiPAP mask rests on her forehead, denies chest pain, admits shortness of breath, denies abdominal pain, denies dysuria and admits chronic back pain. She reports swelling in the right foot and right calf for about a month prior to presentation to [facility] that has resolved.  Patient likes to have intubation or trach done.   SIGNIFICANT EVENTS: 08/22/2016: Admitted [facility] where his acute on chronic COPD exacerbation and hypoosmolar hyponatremia 08/24/2016: Started on vancomycin and  cefepime 08/26/2016: [facility] for [facility] tracheostomy due to continuous need for BiPAP and increased oxygen requirement.  08/27/2016: Intubated. CT chest negative for PE.  11/18- Bronch RML, no DAH, some erthyema blood, BAL sent, tolerated 11/19- versed drip required  SUBJECTIVE:  Pt did fair on PST 11/22. Diuresced overnight. Doing PST > looks comfortable. Anxiety better.  (-) issues overnight.    VITAL SIGNS: BP 132/65   Pulse 84   Temp 98.5 F (36.9 C) (Oral)   Resp (!) 24   Ht 5' (1.524 m)   Wt 81.9 kg (180 lb 8.9 oz)   SpO2 94%   BMI 35.26 kg/m   HEMODYNAMICS:    VENTILATOR SETTINGS: Vent Mode: PRVC FiO2 (%):  [40 %] 40 % Set Rate:  [20 bmp] 20 bmp Vt Set:  [360 mL] 360 mL PEEP:  [5 cmH20] 5 cmH20 Plateau Pressure:  [11 cmH20-25 cmH20] 19 cmH20  INTAKE / OUTPUT: I/O last 3 completed shifts: In: 2085 [I.V.:460; NG/GT:1575; IV Piggyback:50] Out: 3735 [Urine:3735]  PHYSICAL EXAMINATION: GEN: lying in bed. Awake. Less anxious Oropharynx:ETT in place,  obese CVS: s1 s2 RRT RESP:  Reduced. Some bibasilar crackles. (-) wheeze, rhochi GI: Bowel sounds present and normal, soft, non-tender,non-distended GU: foley in place NEURO: awake, follows commands. (-) lateralizing signs.    LABS:  BMET  Recent Labs Lab 08/31/16 0929 09/01/16 0557 09/02/16 0242  NA 146* 143 142  K 4.3 4.4 3.5  CL 97* 97* 93*  CO2 42* 36* 40*  BUN 23* 21* 27*  CREATININE 0.38* <0.30* 0.36*  GLUCOSE 195* 158* 92    Electrolytes  Recent Labs Lab 08/31/16 0158 08/31/16 0929 09/01/16 0557 09/01/16 1009 09/02/16 0242  CALCIUM 8.7* 9.1 9.1  --  9.5  MG 2.3  --  2.2  --  2.0  PHOS 4.1  --  3.4 3.3 4.2    CBC  Recent Labs Lab 08/31/16 0929 09/01/16 0557 09/02/16 0242  WBC 10.8* 8.4 9.6  HGB 11.0* 10.1* 10.0*  HCT 40.2 35.7* 34.7*  PLT 203 181 175    Coag's No results for input(s): APTT, INR in the last 168 hours.  Sepsis Markers  Recent  Labs Lab 08/26/16 1804 08/26/16 2028 08/28/16 0947 08/29/16 0213 08/30/16 0151  LATICACIDVEN 0.9 0.8  --   --   --   PROCALCITON <0.10  --  <0.10 <0.10 <0.10    ABG  Recent Labs Lab 08/27/16 0840 08/27/16 1343 08/28/16 0753  PHART 7.347* 7.381 7.435  PCO2ART 70.5* 63.8* 58.7*  PO2ART 105 104 82.0*    Liver Enzymes  Recent Labs Lab 08/26/16 1804  AST 12*  ALT 14  ALKPHOS 45  BILITOT 0.9  ALBUMIN 3.5    Cardiac Enzymes  Recent Labs Lab 08/26/16 1804 08/26/16 2313 08/27/16 0510  TROPONINI 0.04* <0.03 <0.03    Glucose  Recent Labs Lab 09/01/16 0800 09/01/16 1119 09/01/16 1629 09/01/16 1955 09/01/16 2331 09/02/16 0326  GLUCAP 176* 175* 105* 171* 148* 89    Imaging Dg Chest Port 1 View  Result Date: 09/02/2016 CLINICAL DATA:  Ventilator dependence. EXAM: PORTABLE CHEST 1 VIEW COMPARISON:  09/01/2016 and 08/27/2016 FINDINGS: Endotracheal tube has tip 3.6 cm above the carina. Nasogastric tube courses into the region of the stomach and off the inferior portion of the film as tip is not visualized. Lungs are adequately inflated mild stable prominence of the vascular markings in the left infrahilar region. No focal lobar consolidation, effusion or pneumothorax. Small stable calcified granulomas right lung. Cardiomediastinal silhouette and remainder of the exam is unchanged. IMPRESSION: Mild stable prominence of the left infrahilar vascular markings. Tubes and lines as described. Electronically Signed   By: Elberta Fortisaniel  Boyle M.D.   On: 09/02/2016 07:10   Dg Chest Port 1 View  Result Date: 09/01/2016 CLINICAL DATA:  Hypoxia EXAM: PORTABLE CHEST 1 VIEW COMPARISON:  August 31, 2016 FINDINGS: Endotracheal tube tip is 2.4 cm above the carina. Nasogastric tube tip and side port are below the diaphragm. No pneumothorax. There is generalized interstitial thickening. No airspace consolidation. Heart is upper normal in size with mild pulmonary venous hypertension. No  adenopathy. No bone lesions. IMPRESSION: Tube positions as described without pneumothorax. There is a degree of pulmonary vascular congestion. Interstitial prominence may reflect mild underlying pulmonary edema but also may be reflective of chronic inflammatory type change. No airspace consolidation or adenopathy. Electronically Signed   By: Bretta BangWilliam  Woodruff III M.D.   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 Pt known to have CGD.   ASSESSMENT / PLAN:  PULMONARY A: Acute on chronic hypoxemic hypercapneic resp fx 2/2 h/o end stage COPD, likely with OSA, OHS, RUL PNA H/o  End-stage COPD on 3 L & nocturnal  BiPAP No PE or DVT  P:   Daily PST, WUA for now. Weaning. Anticipate extubate this am. Husband wanted to give pt a shot as far as extubation before trache. If ever, plan to extubate to BipaP.  DuoNeb q6h  Albuterol nebs q2h prn  Steroids: Solumedrol 30 mg q8h >  Will dec to 15 mg q12  (on pred 10 mg daily) Pulmicort 0.5 mg BID Husband wants to hold off on trache if possible.    CARDIOVASCULAR A:  Pulm edema Elevated BNP to 168.  Troponins negative  HTN Echo: EF 65-70%, G1DD  P:  Labetalol oral 300 mg TID.  Labetalol prn.  Tele Cont lasix BID. Check BMET this pm.   RENAL A:   Mild pulm edema P: kvo  Cont lasix BID. Check bmet this pm.   GASTROINTESTINAL A:   GERD P:   Pepcid TFs  HEMATOLOGIC A:   Chronic granulomatous disease Anemia P:   Lovenox for DVT prophylaxis SCD  INFECTIOUS A:   CT chest with concern for RML PNA. S/P 3 days of Abx. Clinically improved.   RSV B Positive  Concern for UTI on 11/21.  (+) Saccharomyces cerrevisae Ig G and IgA > unknown significance.   P:   Monitor fever curve and WBC  cont bactrim for PCP prophylaxis. 1 DS BID.  Rocephin (11/21), urine culture not sent on 11/21. Plan 3 days rocephin unless culture (+).    ENDOCRINE A:   At risk for hyperglycemia  P:   Resistant SSI  lantus (started at hospital) TF coverage with Novolog 2u q4h   NEUROLOGIC A:   Bipolar disorder Anxiety Chronic back pain P:   Valproic acid 500 mg twice a day  Home Risperidone and Remeron Morphine BID  Cont hydroxyzine 25 mg q6.  Off sedation.  If extubated, will try on ativan 0.5 mg IV q8 prn.  May need precedex.   FAMILY  - Updates: no family at bedside.    - Inter-disciplinary family meet or Palliative Care meeting due by:  day 7 of admission  Critical care time today 30 minutes.   Pollie Meyer, MD 09/02/2016, 7:27 AM [place] [facility] Pager [phone] After 3 pm or if no answer, call [phone]

## 2016-09-03 ENCOUNTER — Inpatient Hospital Stay (HOSPITAL_COMMUNITY): Payer: Medicare HMO

## 2016-09-03 LAB — CBC
HCT: 34.3 % — ABNORMAL LOW (ref 36.0–46.0)
HEMOGLOBIN: 10.4 g/dL — AB (ref 12.0–15.0)
MCH: 26 pg (ref 26.0–34.0)
MCHC: 30.3 g/dL (ref 30.0–36.0)
MCV: 85.8 fL (ref 78.0–100.0)
Platelets: 199 10*3/uL (ref 150–400)
RBC: 4 MIL/uL (ref 3.87–5.11)
RDW: 16 % — ABNORMAL HIGH (ref 11.5–15.5)
WBC: 9.5 10*3/uL (ref 4.0–10.5)

## 2016-09-03 LAB — GLUCOSE, CAPILLARY
GLUCOSE-CAPILLARY: 212 mg/dL — AB (ref 65–99)
GLUCOSE-CAPILLARY: 180 mg/dL — AB (ref 65–99)
GLUCOSE-CAPILLARY: 132 mg/dL — AB (ref 65–99)
GLUCOSE-CAPILLARY: 117 mg/dL — AB (ref 65–99)
Glucose-Capillary: 251 mg/dL — ABNORMAL HIGH (ref 65–99)

## 2016-09-03 LAB — BASIC METABOLIC PANEL
ANION GAP: 10 (ref 5–15)
BUN: 26 mg/dL — AB (ref 6–20)
CHLORIDE: 91 mmol/L — AB (ref 101–111)
CO2: 36 mmol/L — ABNORMAL HIGH (ref 22–32)
Calcium: 9.4 mg/dL (ref 8.9–10.3)
Creatinine, Ser: 0.34 mg/dL — ABNORMAL LOW (ref 0.44–1.00)
GFR calc Af Amer: >60 mL/min (ref >60)
GFR calc non Af Amer: >60 mL/min (ref >60)
GLUCOSE: 165 mg/dL — AB (ref 65–99)
POTASSIUM: 4.4 mmol/L (ref 3.5–5.1)
SODIUM: 137 mmol/L (ref 135–145)

## 2016-09-03 LAB — MAGNESIUM: MAGNESIUM: 2.2 mg/dL (ref 1.7–2.4)

## 2016-09-03 LAB — PHOSPHORUS: Phosphorus: 4.3 mg/dL (ref 2.5–4.6)

## 2016-09-03 MED ORDER — FUROSEMIDE 10 MG/ML IJ SOLN
40.0000 mg | Freq: Every day | INTRAMUSCULAR | Status: DC
  Administered 2016-09-04 – 2016-09-05 (×2): 40 mg via INTRAVENOUS
  Filled 2016-09-03 (×2): qty 4

## 2016-09-03 MED ORDER — INSULIN ASPART 100 UNIT/ML ~~LOC~~ SOLN
3.0000 [IU] | SUBCUTANEOUS | Status: DC
  Administered 2016-09-03 – 2016-09-06 (×14): 3 [IU] via SUBCUTANEOUS

## 2016-09-03 MED ORDER — SULFAMETHOXAZOLE-TRIMETHOPRIM 400-80 MG/5ML IV SOLN
160.0000 mg | Freq: Two times a day (BID) | INTRAVENOUS | Status: DC
  Administered 2016-09-03 – 2016-09-06 (×7): 160 mg via INTRAVENOUS
  Filled 2016-09-03 (×12): qty 10

## 2016-09-03 MED ORDER — METHYLPREDNISOLONE SODIUM SUCC 40 MG IJ SOLR
20.0000 mg | Freq: Two times a day (BID) | INTRAMUSCULAR | Status: DC
  Administered 2016-09-04 – 2016-09-05 (×3): 20 mg via INTRAVENOUS
  Filled 2016-09-03: qty 1
  Filled 2016-09-03: qty 0.5
  Filled 2016-09-03 (×2): qty 1
  Filled 2016-09-03: qty 0.5

## 2016-09-03 MED ORDER — ORAL CARE MOUTH RINSE
15.0000 mL | Freq: Two times a day (BID) | OROMUCOSAL | Status: DC
  Administered 2016-09-03 – 2016-09-04 (×2): 15 mL via OROMUCOSAL

## 2016-09-03 NOTE — Progress Notes (Signed)
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  On 08/22/2016, patient presented to [facility] with dyspnea, dry cough and subjective fever and hypoxia to 70's. Initial ABG was 7.4/66/45/41 at [facility]. CBC significant for mild leukocytosis. BMP remarkable for hypoosmolar hyponatremia to 128 otherwise within normal limits. UA was remarkable for pyuria but urine culture was negative. She was started on Solu-Medrol, DuoNeb, Pulmicort and guanfacine-DM for COPD exacerbation. However, she continued to have worsening dyspnea with increased oxygen requirement on BiPAP continuously. She was also started on cefepime and vancomycin on 08/24/2016.  On the day of transfer, vitals and was normal limits except for tachypnea to 25, ABG was 7.4/65/75/41/ Last BMP within normal limits. Mild leukocytosis and hyponatremia resolved.   On arrival, patient is on BiPAP with some increased work of breathing. She reports headache at the site where the the BiPAP mask rests on her forehead, denies chest pain, admits shortness of breath, denies abdominal pain, denies dysuria and admits chronic back pain. She reports swelling in the right foot and right calf for about a month prior to presentation to [facility] that has resolved.  Patient likes to have intubation or trach done.   SIGNIFICANT EVENTS: 08/22/2016: Admitted to [facility] where his acute on chronic COPD exacerbation and hypoosmolar hyponatremia 08/24/2016: Started on vancomycin and  cefepime 08/26/2016: Transferred to [facility] ICU for possible tracheostomy due to continuous need for BiPAP and increased oxygen requirement.  08/27/2016: Intubated. CT chest negative for PE.  11/18- Bronch RML, no DAH, some erthyema blood, BAL sent, tolerated 11/19- versed drip required  SUBJECTIVE:  Alert. No acute events overnight.  Weaning well this morning per RT. (+) cuff leak.    VITAL SIGNS: BP 140/82   Pulse 98   Temp 99 F (37.2 C) (Oral)   Resp (!) 22   Ht 5' (1.524 m)   Wt 177 lb 0.5 oz (80.3 kg)   SpO2 96%   BMI 34.57 kg/m   HEMODYNAMICS:    VENTILATOR SETTINGS: Vent Mode: PRVC FiO2 (%):  [40 %] 40 % Set Rate:  [20 bmp] 20 bmp Vt Set:  [360 mL] 360 mL PEEP:  [5 cmH20] 5 cmH20 Plateau Pressure:  [12 cmH20-18 cmH20] 15 cmH20  INTAKE / OUTPUT: I/O last 3 completed shifts: In: 2260 [I.V.:590; NG/GT:1620; IV Piggyback:50] Out: 5385 [Urine:4935; Emesis/NG output:450]  PHYSICAL EXAMINATION: GEN: lying in bed. Awake. Calm.  Oropharynx:ETT in place,  obese CVS: s1 s2 RRT RESP:  Reduced. Some bibasilar crackles. (-) wheeze, rhochi GI: Bowel sounds present and normal, soft, non-tender,non-distended GU: foley in place NEURO: awake, follows commands. (-) lateralizing signs. Responsive to questions.    LABS:  BMET  Recent Labs Lab 09/02/16 0242 09/02/16 1737 09/03/16 0214  NA 142 135 137  K 3.5 5.0 4.4  CL 93* 90* 91*  CO2 40* 32 36*  BUN 27* 26* 26*  CREATININE 0.36* 0.34* 0.34*  GLUCOSE 92 186* 165*    Electrolytes  Recent Labs Lab 09/01/16 0557 09/01/16 1009 09/02/16 0242 09/02/16 1737 09/03/16 0214  CALCIUM 9.1  --  9.5 9.2 9.4  MG 2.2  --  2.0  --  2.2  PHOS 3.4 3.3 4.2  --  4.3    CBC  Recent Labs Lab 09/01/16 0557 09/02/16 0242 09/03/16 0214  WBC 8.4 9.6 9.5  HGB 10.1* 10.0* 10.4*  HCT 35.7* 34.7* 34.3*  PLT 181 175 199    Coag's No results for input(s): APTT, INR in the last 168 hours.  Sepsis Markers  Recent  Labs Lab 08/28/16 0947 08/29/16 0213 08/30/16 0151  PROCALCITON <0.10 <0.10 <0.10    ABG  Recent Labs Lab 08/27/16 0840 08/27/16 1343 08/28/16 0753  PHART 7.347* 7.381 7.435  PCO2ART 70.5* 63.8* 58.7*  PO2ART 105 104 82.0*    Liver Enzymes No results for input(s): AST, ALT, ALKPHOS, BILITOT, ALBUMIN in the last 168 hours.  Cardiac Enzymes No results for input(s): TROPONINI, PROBNP in the last 168 hours.  Glucose  Recent Labs Lab 09/02/16 0741 09/02/16 1156 09/02/16 1603 09/02/16 2011 09/02/16 2351 09/03/16 0411  GLUCAP 161* 191* 163* 200* 162* 212*    Imaging No results found.   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 Pt known to have CGD.   ASSESSMENT / PLAN:  PULMONARY A: Acute on chronic hypoxemic hypercapneic resp fx 2/2 h/o end stage COPD, likely with OSA, OHS, RUL PNA H/o  End-stage COPD on 3 L & nocturnal BiPAP No PE or DVT  P:   Daily PST. Weaning and looks great. Plan to extubate this am. Hopefully, will do well. Extubate to bipap > on and off for the day.  Possible extubation today. Will extubate to BiPAP.  DuoNeb q6h  Albuterol nebs q2h prn  Steroids: Decadron 4 mg q6h for negative cuff leak > switch to medrol after.  Pulmicort 0.5 mg BID    CARDIOVASCULAR A:  Pulm edema, now net negative  Elevated BNP to 168.  Troponins negative  HTN Echo: EF 65-70%, G1DD  P:  Labetalol oral 300 mg TID.  Labetalol prn.   Tele Lasix BID > decrease to daily.   RENAL A:   Mild pulm edema P: kvo  Decrease lasix to daily.   GASTROINTESTINAL A:   GERD P:   Pepcid TFs  HEMATOLOGIC A:   Chronic granulomatous disease Anemia P:  Lovenox for DVT prophylaxis SCD  INFECTIOUS A:   CT chest with concern for RML PNA. S/P 3 days of Abx. Clinically improved.   RSV B Positive  (+) Saccharomyces cerrevisae Ig G and IgA > unknown significance.  Concern for UTI on 11/21. Urine Cx with 10k colonies of yeast. No UTI. NO CAUTI.   P:   Monitor fever curve and WBC  cont bactrim for PCP prophylaxis. 1 DS BID.  S/p Rocephin x3 days for concern for UTI    ENDOCRINE A:   At risk for hyperglycemia  P:   Resistant SSI  lantus (started at hospital) TF coverage with Novolog 2u q4h >> increase to 3u q4h   NEUROLOGIC A:   Bipolar  disorder Anxiety Chronic back pain P:   Valproic acid 500 mg twice a day  Home Risperidone and Remeron Morphine BID  Cont hydroxyzine 25 mg q6.  Off sedation.  If extubated, will try on ativan 0.5 mg IV q8 prn.  May need precedex.   FAMILY  - Updates: no family at bedside.    - Inter-disciplinary family meet or Palliative Care meeting due by:  day 7 of admission    Marcy Sirenatherine Wallace, D.O. 09/03/2016, 6:59 AM PGY-2, [facility]       ATTENDING NOTE / ATTESTATION NOTE :   I have discussed the case with the resident/APP  Marcy Sirenatherine Wallace DO  I agree with the resident/APP's  history, physical examination, assessment, and plans.    I have edited the above note and modified it according to our agreed history, physical examination, assessment and plan. Note as above.   I have spent 30 minutes of critical care time with this patient today.  Family :.  No family at bedside.    Pollie MeyerJ. Angelo A de Dios, MD 09/03/2016, 8:23 AM [place] [facility] Pager [phone] After 3 pm or if no answer, call  [phone]

## 2016-09-03 NOTE — Procedures (Signed)
Extubation Procedure Note  Patient Details:   Name: Cheryl Wheeler DOB: Jul 09, 1966 MRN: [id number]   Airway Documentation:     Evaluation  O2 sats: stable throughout Complications: No apparent complications Patient did tolerate procedure well. Bilateral Breath Sounds: Rhonchi, Expiratory wheezes   Yes   Pt extubated to Bipap (NIV PS 5/5) per MD order. Pt stable throughout with no complications. Pt with good strong productive cough and able to speak her name post extubation. Pt informed will try bipap for around 2 hrs then attempt a break and possible [place] per tolerance. Pt understands. RN at bedside. RT will continue to monitor.   [name], [name] 09/03/2016, 9:36 AM

## 2016-09-03 NOTE — Progress Notes (Signed)
eLink Physician-Brief Progress Note Patient Name: Adella NissenJonce Ann Brazier DOB: 22-Aug-1966 MRN: [id number]   Date of Service  09/03/2016  HPI/Events of Note  RN asking about diet  eICU Interventions  Since patient on bipap and copd with risk for rintubation - clears only tonight     Intervention Category Evaluation Type: Other  Ahmaya Ostermiller 09/03/2016, 4:33 PM

## 2016-09-03 NOTE — Progress Notes (Signed)
Pt taken off bipap and placed on 3L [place] per MD order. Pt wears 2-3 L at home. RT will continue to monitor.

## 2016-09-04 LAB — GLUCOSE, CAPILLARY
GLUCOSE-CAPILLARY: 144 mg/dL — AB (ref 65–99)
GLUCOSE-CAPILLARY: 209 mg/dL — AB (ref 65–99)
GLUCOSE-CAPILLARY: 252 mg/dL — AB (ref 65–99)
GLUCOSE-CAPILLARY: 316 mg/dL — AB (ref 65–99)
Glucose-Capillary: 103 mg/dL — ABNORMAL HIGH (ref 65–99)
Glucose-Capillary: 148 mg/dL — ABNORMAL HIGH (ref 65–99)
Glucose-Capillary: 156 mg/dL — ABNORMAL HIGH (ref 65–99)

## 2016-09-04 LAB — BASIC METABOLIC PANEL
Anion gap: 9 (ref 5–15)
BUN: 22 mg/dL — AB (ref 6–20)
CHLORIDE: 91 mmol/L — AB (ref 101–111)
CO2: 35 mmol/L — ABNORMAL HIGH (ref 22–32)
Calcium: 9.6 mg/dL (ref 8.9–10.3)
Creatinine, Ser: 0.33 mg/dL — ABNORMAL LOW (ref 0.44–1.00)
GFR calc non Af Amer: >60 mL/min (ref >60)
Glucose, Bld: 107 mg/dL — ABNORMAL HIGH (ref 65–99)
POTASSIUM: 4.4 mmol/L (ref 3.5–5.1)
SODIUM: 135 mmol/L (ref 135–145)

## 2016-09-04 LAB — CBC
HCT: 35.5 % — ABNORMAL LOW (ref 36.0–46.0)
HEMOGLOBIN: 11 g/dL — AB (ref 12.0–15.0)
MCH: 25.9 pg — AB (ref 26.0–34.0)
MCHC: 31 g/dL (ref 30.0–36.0)
MCV: 83.7 fL (ref 78.0–100.0)
Platelets: 242 10*3/uL (ref 150–400)
RBC: 4.24 MIL/uL (ref 3.87–5.11)
RDW: 16.2 % — ABNORMAL HIGH (ref 11.5–15.5)
WBC: 12.3 10*3/uL — ABNORMAL HIGH (ref 4.0–10.5)

## 2016-09-04 LAB — MAGNESIUM: MAGNESIUM: 2.3 mg/dL (ref 1.7–2.4)

## 2016-09-04 LAB — TRIGLYCERIDES: Triglycerides: 198 mg/dL — ABNORMAL HIGH (ref <150)

## 2016-09-04 LAB — PHOSPHORUS: PHOSPHORUS: 4.4 mg/dL (ref 2.5–4.6)

## 2016-09-04 MED ORDER — CHLORHEXIDINE GLUCONATE 0.12 % MT SOLN
15.0000 mL | Freq: Two times a day (BID) | OROMUCOSAL | Status: DC
  Administered 2016-09-04 – 2016-09-09 (×10): 15 mL via OROMUCOSAL
  Filled 2016-09-04 (×10): qty 15

## 2016-09-04 MED ORDER — LORAZEPAM 2 MG/ML IJ SOLN
0.2500 mg | INTRAMUSCULAR | Status: DC | PRN
  Administered 2016-09-05: 0.25 mg via INTRAVENOUS
  Filled 2016-09-04: qty 1

## 2016-09-04 MED ORDER — ORAL CARE MOUTH RINSE
15.0000 mL | Freq: Two times a day (BID) | OROMUCOSAL | Status: DC
  Administered 2016-09-04 – 2016-09-09 (×9): 15 mL via OROMUCOSAL

## 2016-09-04 MED ORDER — CHLORHEXIDINE GLUCONATE 0.12 % MT SOLN: 15.0000 mL | Freq: Two times a day (BID) | OROMUCOSAL | Status: DC

## 2016-09-04 NOTE — Progress Notes (Signed)
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  On 08/22/2016, patient presented to [facility] with dyspnea, dry cough and subjective fever and hypoxia to 70's. Initial ABG was 7.4/66/45/41 [facility]. CBC significant for mild leukocytosis. BMP remarkable for hypoosmolar hyponatremia to 128 otherwise within normal limits. UA was remarkable for pyuria but urine culture was negative. She was started on Solu-Medrol, DuoNeb, Pulmicort and guanfacine-DM for COPD exacerbation. However, she continued to have worsening dyspnea with increased oxygen requirement on BiPAP continuously. She was also started on cefepime and vancomycin on 08/24/2016.  On the day of transfer, vitals and was normal limits except for tachypnea to 25, ABG was 7.4/65/75/41/ Last BMP within normal limits. Mild leukocytosis and hyponatremia resolved.   On arrival, patient is on BiPAP with some increased work of breathing. She reports headache at the site where the the BiPAP mask rests on her forehead, denies chest pain, admits shortness of breath, denies abdominal pain, denies dysuria and admits chronic back pain. She reports swelling in the right foot and right calf for about a month prior to presentation to [facility] that has resolved.  Patient likes to have intubation or trach done.   SIGNIFICANT EVENTS: 08/22/2016: Admitted to [facility] where his acute on chronic COPD exacerbation and hypoosmolar hyponatremia 08/24/2016: Started on vancomycin and  cefepime 08/26/2016: [facility] due to continuous need for BiPAP and increased oxygen requirement.  08/27/2016: Intubated. CT chest negative for PE.  11/18- Bronch RML, no DAH, some erthyema blood, BAL sent, tolerated 11/19- versed drip required  SUBJECTIVE:  Extubated on 11/24 and doing well considering her chronic SOB over the last yr.  On 3 L o2 (baseline) On and off bipap > tolerating well.   VITAL SIGNS: BP 135/83   Pulse 79   Temp 97.9 F (36.6 C) (Oral)   Resp 17   Ht 5' (1.524 m)   Wt 79.4 kg (175 lb 0.7 oz)   SpO2 92%   BMI 34.19 kg/m   HEMODYNAMICS:    VENTILATOR SETTINGS: Vent Mode: BIPAP FiO2 (%):  [40 %] 40 % Set Rate:  [20 bmp] 20 bmp PEEP:  [5 cmH20] 5 cmH20  INTAKE / OUTPUT: I/O last 3 completed shifts: In: 1765 [P.O.:240; I.V.:350; NG/GT:655; IV Piggyback:520] Out: 2795 [Urine:2795]  PHYSICAL EXAMINATION: GEN: lying in bed. Awake. Calm.  Oropharynx:ETT in place,  obese CVS: s1 s2 RRT RESP:  Reduced. Some bibasilar crackles. (-) wheeze, rhochi GI: Bowel sounds present and normal, soft, non-tender,non-distended GU: foley in place NEURO: awake, follows commands. (-) lateralizing signs. Responsive to questions.    LABS:  BMET  Recent Labs Lab 09/02/16 1737 09/03/16 0214 09/04/16 0309  NA 135 137 135  K 5.0 4.4 4.4  CL 90* 91* 91*  CO2 32 36* 35*  BUN 26* 26* 22*  CREATININE 0.34* 0.34* 0.33*  GLUCOSE 186* 165* 107*    Electrolytes  Recent Labs Lab 09/02/16 0242 09/02/16 1737 09/03/16 0214 09/04/16 0309  CALCIUM 9.5 9.2 9.4 9.6  MG 2.0  --  2.2  2.3  PHOS 4.2  --  4.3 4.4    CBC  Recent Labs Lab 09/02/16 0242 09/03/16 0214 09/04/16 0309  WBC 9.6 9.5 12.3*  HGB 10.0* 10.4* 11.0*  HCT 34.7* 34.3* 35.5*  PLT 175 199 242    Coag's No results for input(s): APTT, INR in the last 168 hours.  Sepsis Markers  Recent Labs Lab 08/29/16 0213 08/30/16 0151  PROCALCITON <0.10 <0.10     ABG No results for input(s): PHART, PCO2ART, PO2ART in the last 168 hours.  Liver Enzymes No results for input(s): AST, ALT, ALKPHOS, BILITOT, ALBUMIN in the last 168 hours.  Cardiac Enzymes No results for input(s): TROPONINI, PROBNP in the last 168 hours.  Glucose  Recent Labs Lab 09/03/16 1545 09/03/16 1942 09/03/16 2317 09/04/16 0341 09/04/16 0800 09/04/16 1221  GLUCAP 117* 251* 209* 103* 148* 252*    Imaging No results found.   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 Pt known to have CGD.   ASSESSMENT / PLAN:  PULMONARY A: Acute on chronic hypoxemic hypercapneic resp fx 2/2 h/o end stage COPD, likely with OSA, OHS, RUL PNA H/o  End-stage COPD on 3 L & nocturnal BiPAP No PE or DVT  Extubated on 11/24 and looks like she is better than baseline. On 3L o2 now.  P:   Bipap prn daytime and needs at HS.  Will need a sleep study as outpt. O2 3L now 24/7. Try to cut down.  DuoNeb q6h  Albuterol nebs q2h prn  Steroids > will switch to PO in am > will need a slow wean.  Pulmicort 0.5 mg BID    CARDIOVASCULAR A:  Pulm edema, improved.  HTN Echo: EF 65-70%, G1DD  P:  Labetalol oral 300 mg TID.  Labetalol prn.  Tele Lasix daily.    RENAL A:   Mild pulm edema P: Lasix daily  GASTROINTESTINAL A:   GERD P:   Pepcid Cont PO diet  HEMATOLOGIC A:   Chronic granulomatous disease Anemia P:  Lovenox for DVT  prophylaxis SCD  INFECTIOUS A:   CT chest with concern for RML PNA. S/P 3 days of Abx. Clinically improved.   RSV B Positive  (+) Saccharomyces cerrevisae Ig G and IgA > unknown significance.  Concern for UTI on 11/21. Urine Cx with 10k colonies of yeast. No UTI. NO CAUTI.   P:   Monitor fever curve and WBC  cont bactrim for PCP prophylaxis. 1 DS BID.  S/p Rocephin x3 days for concern for UTI    ENDOCRINE A:   At risk for hyperglycemia  P:   Resistant SSI  lantus (started at hospital) > wean off as steroids being weaned off   NEUROLOGIC A:   Bipolar disorder Anxiety Chronic back pain P:   Valproic acid 500 mg twice a day  Home Risperidone and Remeron Morphine BID  Cont hydroxyzine 25 mg q6.   Start ativan 0/25 mg IV q4 prn for anxiety.  She is at her home  meds for anxiety.    FAMILY    Family :.Discussed plan with husband and wife on 11/24.   Plan : transfer to SDU.  TRH will be primary in am. Dr. Sharon SellerMcClung aware. PCCM will follow given pt's significant lung issues.   Pollie MeyerJ. Angelo A de Dios, MD 09/04/2016, 1:17 PM [place] Pager [phone] After 3 pm or if no answer, call [phone]

## 2016-09-04 NOTE — Progress Notes (Signed)
Received pt from 28M per bed alert/oriented

## 2016-09-04 NOTE — Progress Notes (Signed)
Pt placed on Bipap with 3lp oxygen titrated into face mask. Pt placed on for a nap. Will continue to monitor

## 2016-09-05 LAB — BASIC METABOLIC PANEL
ANION GAP: 11 (ref 5–15)
BUN: 19 mg/dL (ref 6–20)
CALCIUM: 9.5 mg/dL (ref 8.9–10.3)
CHLORIDE: 88 mmol/L — AB (ref 101–111)
CO2: 37 mmol/L — ABNORMAL HIGH (ref 22–32)
CREATININE: 0.42 mg/dL — AB (ref 0.44–1.00)
GFR calc non Af Amer: >60 mL/min (ref >60)
Glucose, Bld: 110 mg/dL — ABNORMAL HIGH (ref 65–99)
Potassium: 3.5 mmol/L (ref 3.5–5.1)
SODIUM: 136 mmol/L (ref 135–145)

## 2016-09-05 LAB — MAGNESIUM: MAGNESIUM: 2.1 mg/dL (ref 1.7–2.4)

## 2016-09-05 LAB — CBC
HCT: 36.6 % (ref 36.0–46.0)
HEMOGLOBIN: 11.2 g/dL — AB (ref 12.0–15.0)
MCH: 25.6 pg — ABNORMAL LOW (ref 26.0–34.0)
MCHC: 30.6 g/dL (ref 30.0–36.0)
MCV: 83.8 fL (ref 78.0–100.0)
Platelets: 236 10*3/uL (ref 150–400)
RBC: 4.37 MIL/uL (ref 3.87–5.11)
RDW: 17 % — ABNORMAL HIGH (ref 11.5–15.5)
WBC: 12.9 10*3/uL — AB (ref 4.0–10.5)

## 2016-09-05 LAB — GLUCOSE, CAPILLARY
GLUCOSE-CAPILLARY: 84 mg/dL (ref 65–99)
GLUCOSE-CAPILLARY: 256 mg/dL — AB (ref 65–99)
GLUCOSE-CAPILLARY: 105 mg/dL — AB (ref 65–99)
GLUCOSE-CAPILLARY: 291 mg/dL — AB (ref 65–99)
Glucose-Capillary: 159 mg/dL — ABNORMAL HIGH (ref 65–99)
Glucose-Capillary: 85 mg/dL (ref 65–99)
Glucose-Capillary: 258 mg/dL — ABNORMAL HIGH (ref 65–99)

## 2016-09-05 LAB — PHOSPHORUS: PHOSPHORUS: 3.9 mg/dL (ref 2.5–4.6)

## 2016-09-05 MED ORDER — PREDNISONE 10 MG (21) PO TBPK
10.0000 mg | ORAL_TABLET | ORAL | Status: AC
  Administered 2016-09-05: 10 mg via ORAL

## 2016-09-05 MED ORDER — PREDNISONE 10 MG (21) PO TBPK
10.0000 mg | ORAL_TABLET | Freq: Four times a day (QID) | ORAL | Status: DC
  Administered 2016-09-07 – 2016-09-09 (×7): 10 mg via ORAL

## 2016-09-05 MED ORDER — PREDNISONE 10 MG (21) PO TBPK
20.0000 mg | ORAL_TABLET | Freq: Every evening | ORAL | Status: AC
  Administered 2016-09-05: 20 mg via ORAL

## 2016-09-05 MED ORDER — PREDNISONE 10 MG (21) PO TBPK
20.0000 mg | ORAL_TABLET | Freq: Every evening | ORAL | Status: AC
  Administered 2016-09-06: 20 mg via ORAL

## 2016-09-05 MED ORDER — GUAIFENESIN-DM 100-10 MG/5ML PO SYRP
5.0000 mL | ORAL_SOLUTION | ORAL | Status: DC | PRN
  Administered 2016-09-06 (×2): 5 mL via ORAL
  Filled 2016-09-05 (×2): qty 5

## 2016-09-05 MED ORDER — FUROSEMIDE 40 MG PO TABS
40.0000 mg | ORAL_TABLET | Freq: Every day | ORAL | Status: DC
  Administered 2016-09-06 – 2016-09-08 (×3): 40 mg via ORAL
  Filled 2016-09-05 (×3): qty 1

## 2016-09-05 MED ORDER — PREDNISONE 10 MG (21) PO TBPK
10.0000 mg | ORAL_TABLET | Freq: Three times a day (TID) | ORAL | Status: AC
  Administered 2016-09-06 (×3): 10 mg via ORAL

## 2016-09-05 MED ORDER — PREDNISONE 10 MG (21) PO TBPK
20.0000 mg | ORAL_TABLET | Freq: Every morning | ORAL | Status: AC
  Administered 2016-09-05: 20 mg via ORAL
  Filled 2016-09-05: qty 21

## 2016-09-05 NOTE — Progress Notes (Signed)
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 On 08/22/2016, patient presented to [facility] with dyspnea, dry cough and subjective fever and hypoxia to 70's. Initial ABG was 7.4/66/45/41 at [facility]. CBC significant for mild leukocytosis. BMP remarkable for hypoosmolar hyponatremia to 128 otherwise within normal limits. UA was remarkable for pyuria but urine culture was negative. She was started on Solu-Medrol, DuoNeb, Pulmicort and guanfacine-DM for COPD exacerbation. However, she continued to have worsening dyspnea with increased oxygen requirement on BiPAP continuously. She was also started on cefepime and vancomycin on 08/24/2016. Given lack of improvement, patient was transferred to [facility] around 11/16. On 11/17, patient was intubated, she also underwent a CT angiogram of the chest which was negative for PE. She had a bronchoscopy on 11/18. Only positive micro was positive RSV on a respiratory virus panel. She eventually was able to be liberated from the ventilator and was extubated on 11/24, and TRH to call her on 11/26. Pulmonary continues to round on the patient given complex pulmonary history.  SIGNIFICANT EVENTS: 08/22/2016 - Admitted to [facility] where his acute on chronic COPD exacerbation and hypoosmolar hyponatremia 08/24/2016 - Started on vancomycin and cefepime 08/26/2016 - Transferred to [facility] ICU for possible tracheostomy due to continuous need for BiPAP and increased oxygen requirement.  08/27/2016 - Intubated. CT chest negative for PE.  11/18 - Bronch  RML, no DAH, some erthyema blood, BAL sent, tolerated 11/19 - versed drip required 11/24 - extubated 11/26 - TRH primary  Assessment & Plan: Principal Problem:   Acute respiratory failure with hypoxia (HCC) Active Problems:   Endotracheally intubated   Encounter for orogastric (OG) tube placement   Dyspnea   Community acquired pneumonia of right middle lobe of lung (HCC)   Acute pulmonary edema (HCC)  Acute on chronic hypoxic and hypercapnic respiratory failure multifactorial due to advanced COPD, OSA, OHS - Progressed to VDRF during this hospitalization, now extubated and back on 3 L nasal cannula which is her home O2.  - BiPAP when necessary daytime and as needed daily at bedtime.  - Patient currently is on Pulmicort twice daily, DuoNeb scheduled every 6 hours, IV Solu-Medrol and will be started on a prednisone taper by pulmonology   Acute pulmonary edema - This appears resolved, she has been on IV Lasix, weight 182 pounds on admission and she is now @ 172 pounds - Switch Lasix to by mouth starting tomorrow she appears euvolemic  Chronic granulomatous disease - On Bactrim prophylaxis, continue - Seen by immunologist in outpatient  Acute on chronic diastolic CHF - currently fluid status improving, transition Lasix to po   DM - insulin with Lantus 10 U, Novolog 3U scheduled plus SSI  HTN - on labetalol, blood pressure well controlled today, continue current regimen  CAP - CT of the chest with concern for pneumonia. Finished a course of antibiotics. RSV B positive  Bipolar / Anxiety - continue home medications  Chronic back pain - stable   DVT prophylaxis: Lovenox  Code Status: Full Family Communication: no family bedside Disposition Plan: TBD  Consultants:   Pulmonology   Procedures:   2D echo  Study Conclusions - Left ventricle: The cavity size was normal. There was moderate concentric hypertrophy. Systolic function was vigorous. The estimated ejection  fraction was in the range of 65% to 70%. Wall motion was normal; there were no regional wall motion abnormalities. Doppler parameters are consistent with abnormal left ventricular relaxation (grade 1 diastolic dysfunction). - Aortic valve: Valve area (VTI): 1.45 cm^2. Valve area (Vmax): 1.27 cm^2. Valve area (Vmean): 1.29 cm^2. - Mitral valve: Calcified annulus. - Left atrium: The atrium was mildly dilated. - Right ventricle: The cavity size was normal. Wall thickness was mildly increased. - Pericardium, extracardiac: A trivial pericardial effusion was identified posterior to the heart.  Antimicrobials: 08/24/2016 cefepime 08/26/2016 08/24/2016 vancomycin 08/26/2016 11/21 rocephin > 11/24   Subjective: - no chest pain, no abdominal pain, nausea or vomiting - Ongoing shortness of breath, however improved. Her voice is soft, states that after intubation it has been like that. Improving.  Objective: Vitals:   09/05/16 0400 09/05/16 0653 09/05/16 0810 09/05/16 0846  BP: 124/72 137/67    Pulse: 71 88    Resp: 14     Temp:   97.6 F (36.4 C)   TempSrc:   Oral   SpO2: 93%   98%  Weight: 78.1 kg (172 lb 2.9 oz)     Height:        Intake/Output Summary (Last 24 hours) at 09/05/16 1058 Last data filed at 09/05/16 1000  Gross per 24 hour  Intake              955 ml  Output             2090 ml  Net            -1135 ml   Filed Weights   09/04/16 0500 09/04/16 1821 09/05/16 0400  Weight: 79.4 kg (175 lb 0.7 oz) 77.8 kg (171 lb 8.3 oz) 78.1 kg (172 lb 2.9 oz)    Examination: Constitutional: NAD, appears weak Vitals:   09/05/16 0400 09/05/16 0653 09/05/16 0810 09/05/16 0846  BP: 124/72 137/67    Pulse: 71 88    Resp: 14     Temp:   97.6 F (36.4 C)   TempSrc:   Oral   SpO2: 93%   98%  Weight: 78.1 kg (172 lb 2.9 oz)     Height:       Eyes: PERRL, lids and conjunctivae normal ENMT: Mucous membranes are moist.  Respiratory: clear to auscultation bilaterally, no wheezing. Very  distant breath sounds Cardiovascular: Regular rate and rhythm, no murmurs / rubs / gallops. No LE edema Skin: no rashes Neurologic: non focal  Psychiatric: Normal judgment and insight. Alert and oriented x 3. Normal mood.    Data Reviewed: I have personally reviewed following labs and imaging studies  CBC:  Recent Labs Lab 08/30/16 0151  09/01/16 0557 09/02/16 0242 09/03/16 0214 09/04/16 0309 09/05/16 0312  WBC 8.4  < > 8.4 9.6 9.5 12.3* 12.9*  NEUTROABS 7.7  --   --   --   --   --   --   HGB 10.6*  < > 10.1* 10.0* 10.4* 11.0* 11.2*  HCT 37.4  < > 35.7* 34.7* 34.3* 35.5* 36.6  MCV 88.6  < > 90.4 87.6 85.8 83.7 83.8  PLT 181  < > 181 175 199 242 236  < > = values in this interval not displayed. Basic Metabolic Panel:  Recent Labs Lab 09/01/16 0557 09/01/16 1009 09/02/16 0242 09/02/16 1737 09/03/16 0214  09/04/16 0309 09/05/16 0312  NA 143  --  142 135 137 135 136  K 4.4  --  3.5 5.0 4.4 4.4 3.5  CL 97*  --  93* 90* 91* 91* 88*  CO2 36*  --  40* 32 36* 35* 37*  GLUCOSE 158*  --  92 186* 165* 107* 110*  BUN 21*  --  27* 26* 26* 22* 19  CREATININE <0.30*  --  0.36* 0.34* 0.34* 0.33* 0.42*  CALCIUM 9.1  --  9.5 9.2 9.4 9.6 9.5  MG 2.2  --  2.0  --  2.2 2.3 2.1  PHOS 3.4 3.3 4.2  --  4.3 4.4 3.9   CBG:  Recent Labs Lab 09/04/16 2057 09/05/16 0000 09/05/16 0213 09/05/16 0407 09/05/16 0808  GLUCAP 156* 316* 159* 85 84   Lipid Profile:  Recent Labs  09/04/16 0309  TRIG 198*   Thyroid Function Tests: No results for input(s): TSH, T4TOTAL, FREET4, T3FREE, THYROIDAB in the last 72 hours. Anemia Panel: No results for input(s): VITAMINB12, FOLATE, FERRITIN, TIBC, IRON, RETICCTPCT in the last 72 hours. Urine analysis:    Component Value Date/Time   COLORURINE YELLOW 08/31/2016 1646   APPEARANCEUR HAZY (A) 08/31/2016 1646   LABSPEC 1.025 08/31/2016 1646   PHURINE 7.5 08/31/2016 1646   GLUCOSEU NEGATIVE 08/31/2016 1646   HGBUR NEGATIVE 08/31/2016 1646    BILIRUBINUR NEGATIVE 08/31/2016 1646   KETONESUR NEGATIVE 08/31/2016 1646   PROTEINUR NEGATIVE 08/31/2016 1646   NITRITE NEGATIVE 08/31/2016 1646   LEUKOCYTESUR MODERATE (A) 08/31/2016 1646   Sepsis Labs: Invalid input(s): PROCALCITONIN, LACTICIDVEN  Recent Results (from the past 240 hour(s))  MRSA PCR Screening     Status: None   Collection Time: 08/26/16  4:51 PM  Result Value Ref Range Status   MRSA by PCR NEGATIVE NEGATIVE Final    Comment:        The GeneXpert MRSA Assay (FDA approved for NASAL specimens only), is one component of a comprehensive MRSA colonization surveillance program. It is not intended to diagnose MRSA infection nor to guide or monitor treatment for MRSA infections.   Culture, Urine     Status: None   Collection Time: 08/27/16  9:59 AM  Result Value Ref Range Status   Specimen Description URINE, CATHETERIZED  Final   Special Requests NONE  Final   Culture NO GROWTH  Final   Report Status 08/28/2016 FINAL  Final  Respiratory virus panel     Status: Abnormal   Collection Time: 08/28/16 11:15 AM  Result Value Ref Range Status   Source - RVPAN BRONCHIAL ALVEOLAR LAVAGE  Final   Respiratory Syncytial Virus A Negative Negative Final   Respiratory Syncytial Virus B Positive (A) Negative Final    Comment:                   Client Requested Flag   Influenza A Negative Negative Final   Influenza B Negative Negative Final   Parainfluenza 1 Negative Negative Final   Parainfluenza 2 Negative Negative Final   Parainfluenza 3 Negative Negative Final   Metapneumovirus Negative Negative Final   Rhinovirus Negative Negative Final   Adenovirus Negative Negative Final    Comment: (NOTE) Performed At: [facility] [address] [place], [place] [id number] Mila Homer MD WU:[id number]   Culture, respiratory (NON-Expectorated)     Status: None   Collection Time: 08/28/16 11:15 AM  Result Value Ref Range Status   Specimen Description BRONCHIAL  ALVEOLAR LAVAGE  Final   Special Requests NONE  Final   Gram Stain   Final    FEW WBC PRESENT, PREDOMINANTLY MONONUCLEAR NO ORGANISMS SEEN    Culture Consistent with normal respiratory flora.  Final   Report Status 08/30/2016 FINAL  Final  Culture, Urine     Status: Abnormal   Collection Time: 09/01/16  8:51 AM  Result Value Ref Range Status   Specimen Description URINE, CATHETERIZED  Final   Special Requests NONE  Final   Culture 10,000 COLONIES/mL YEAST (A)  Final   Report Status 09/02/2016 FINAL  Final      Radiology Studies: No results found.   Scheduled Meds: . budesonide (PULMICORT) nebulizer solution  0.5 mg Nebulization BID  . chlorhexidine  15 mL Mouth Rinse BID  . enoxaparin (LOVENOX) injection  40 mg Subcutaneous Q24H  . famotidine  20 mg Per Tube BID  . furosemide  40 mg Intravenous Daily  . insulin aspart  0-20 Units Subcutaneous Q4H  . insulin aspart  3 Units Subcutaneous Q4H  . insulin glargine  10 Units Subcutaneous Q24H  . ipratropium-albuterol  3 mL Nebulization Q6H  . labetalol  300 mg Per Tube TID  . mouth rinse  15 mL Mouth Rinse q12n4p  . methylPREDNISolone (SOLU-MEDROL) injection  20 mg Intravenous Q12H  . mirtazapine  30 mg Per Tube QHS  . morphine  15 mg Oral q12n4p  . predniSONE  10 mg Oral PC lunch  . predniSONE  10 mg Oral PC supper  . [START ON 09/06/2016] predniSONE  10 mg Oral 3 x daily with food  . [START ON 09/07/2016] predniSONE  10 mg Oral 4X daily taper  . predniSONE  20 mg Oral AC breakfast  . predniSONE  20 mg Oral Nightly  . [START ON 09/06/2016] predniSONE  20 mg Oral Nightly  . risperiDONE  2 mg Per Tube BID  . sulfamethoxazole-trimethoprim  160 mg Intravenous Q12H  . Valproate Sodium  500 mg Per Tube BID   Continuous Infusions:   Pamella Pertostin Jenay Morici, MD, PhD [facility] Pager [phone]  If 7PM-7AM, please contact night-coverage [email or web address] Password [email or web address] 09/05/2016, 10:58 AM

## 2016-09-05 NOTE — Progress Notes (Signed)
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  On 08/22/2016, patient presented to [facility] with dyspnea, dry cough and subjective fever and hypoxia to 70's. Initial ABG was 7.4/66/45/41 at [facility]. CBC significant for mild leukocytosis. BMP remarkable for hypoosmolar hyponatremia to 128 otherwise within normal limits. UA was remarkable for pyuria but urine culture was negative. She was started on Solu-Medrol, DuoNeb, Pulmicort and guanfacine-DM for COPD exacerbation. However, she continued to have worsening dyspnea with increased oxygen requirement on BiPAP continuously. She was also started on cefepime and vancomycin on 08/24/2016.  On the day of transfer, vitals and was normal limits except for tachypnea to 25, ABG was 7.4/65/75/41/ Last BMP within normal limits. Mild leukocytosis and hyponatremia resolved.   On arrival, patient is on BiPAP with some increased work of breathing. She reports headache at the site where the the BiPAP mask rests on her forehead, denies chest pain, admits shortness of breath, denies abdominal pain, denies dysuria and admits chronic back pain. She reports swelling in the right foot and right calf for about a month prior to presentation to [facility] that has resolved.  Patient likes to have intubation or trach done.   SIGNIFICANT EVENTS: 08/22/2016: Admitted to [facility] where his acute on chronic COPD exacerbation and hypoosmolar hyponatremia 08/24/2016: Started on vancomycin and  cefepime 08/26/2016: Transferred to [facility] ICU for possible tracheostomy due to continuous need for BiPAP and increased oxygen requirement.  08/27/2016: Intubated. CT chest negative for PE.  11/18- Bronch RML, no DAH, some erthyema blood, BAL sent, tolerated 11/19- versed drip required  SUBJECTIVE:  Extubated on 11/24 and doing well considering her chronic SOB over the last yr.  On 3 L o2 (baseline)- She says home O2 is 2L On and off bipap > tolerating well.   VITAL SIGNS: BP 137/67   Pulse 88   Temp 97.6 F (36.4 C) (Oral)   Resp 14   Ht 5' (1.524 m)   Wt 78.1 kg (172 lb 2.9 oz)   SpO2 98%   BMI 33.63 kg/m   HEMODYNAMICS:    VENTILATOR SETTINGS:    INTAKE / OUTPUT: I/O last 3 completed shifts: In: 1245 [P.O.:240; I.V.:225; IV Piggyback:780] Out: 2555 [Urine:2555]  PHYSICAL EXAMINATION: GEN: lying in bed. Awake. Calm.  Oropharynx: Weak/ hoarse voice w/o stridor after extubation CVS: s1 s2 RRT RESP: Coarse breath sounds, unlabored GI: Bowel sounds present and normal, soft, non-tender,non-distended GU:  NEURO: awake, follows commands. (-) lateralizing signs. Responsive to questions.    LABS:  BMET  Recent Labs Lab 09/03/16 0214 09/04/16 0309 09/05/16 0312  NA 137 135 136  K 4.4 4.4 3.5  CL 91* 91* 88*  CO2 36* 35* 37*  BUN 26* 22* 19  CREATININE 0.34* 0.33* 0.42*  GLUCOSE 165* 107* 110*    Electrolytes  Recent Labs Lab 09/03/16 0214 09/04/16 0309 09/05/16 0312  CALCIUM 9.4 9.6 9.5  MG 2.2 2.3 2.1  PHOS 4.3 4.4 3.9    CBC  Recent Labs Lab 09/03/16 0214 09/04/16 0309 09/05/16 0312  WBC 9.5 12.3* 12.9*  HGB 10.4* 11.0* 11.2*  HCT 34.3* 35.5* 36.6  PLT 199 242 236    Coag's No results for input(s): APTT, INR in the last 168 hours.  Sepsis Markers  Recent Labs Lab 08/30/16 0151  PROCALCITON <0.10    ABG No results for input(s): PHART, PCO2ART, PO2ART in the last 168 hours.  Liver Enzymes No results for input(s): AST, ALT,  ALKPHOS, BILITOT, ALBUMIN in the last 168 hours.  Cardiac Enzymes No results for input(s): TROPONINI, PROBNP in the last 168 hours.  Glucose  Recent Labs Lab 09/04/16 1600 09/04/16 2057 09/05/16 0000 09/05/16 0213 09/05/16 0407 09/05/16 0808  GLUCAP 144* 156* 316* 159* 85 84    Imaging No results found.   STUDIES:  11/16: LE doppler negative for DVT  11/18 Bronch , RML, no DAH, some erythema blood rml  CULTURES: Culture at OSH negative BAL RML11/18>>>re-incubated for better growth  Urine cx: 10k colonies of yeast   ANTIBIOTICS: 08/24/2016 cefepime 08/26/2016 08/24/2016 vancomycin 08/26/2016 11/21 rocephin > 11/24  LINES/TUBES: Foley 11/21 >>  11/17 ett>>>11/24  DISCUSSION: Cheryl Wheeler is a 50 year old female with ?end-stage COPD on 3 L ( She says 2 L at home) oxygen and BiPAP at home [facility] ICU for Acute on chronic respiratory failure due to COPD exacerbation. Pt known to have CGD.   ASSESSMENT / PLAN:  PULMONARY A: Acute on chronic hypoxemic hypercapneic resp fx 2/2 h/o end stage COPD, likely with OSA, OHS, RUL PNA H/o  End-stage COPD on 3 L & nocturnal BiPAP No PE or DVT  Extubated on 11/24  On 3L o2 now. Residual hoarseness w/ stridor Husband had refused permission for trach P:   Bipap prn daytime and needs at HS.  Will need a sleep study as outpt. O2 3L now 24/7. Try to cut down.  DuoNeb q6h  Albuterol nebs q2h prn  Steroids > changed to prednisone for taper Pulmicort 0.5 mg BID- may need to dc due to hoarseness    CARDIOVASCULAR A:  Pulm edema, improved.  HTN Echo: EF 65-70%, G1DD  P:  Labetalol oral 300 mg TID.  Labetalol prn.  Tele Lasix daily.    RENAL A:   Mild pulm edema P: Lasix daily  GASTROINTESTINAL A:   GERD P:   Pepcid Cont PO diet  HEMATOLOGIC A:   Chronic granulomatous disease Anemia P:  Lovenox for DVT prophylaxis SCD  INFECTIOUS A:   CT chest with concern for RML PNA. S/P 3 days of Abx.  Clinically improved.   RSV B Positive  (+) Saccharomyces cerrevisae Ig G and IgA > unknown significance.  Concern for UTI on 11/21. Urine Cx with 10k colonies of yeast. No UTI. NO CAUTI.   P:   Monitor fever curve and WBC  cont bactrim for PCP prophylaxis. 1 DS BID.  S/p Rocephin x3 days for concern for UTI    ENDOCRINE A:   At risk for hyperglycemia  P:   Resistant SSI  lantus (started at hospital) > wean off as steroids being weaned off   NEUROLOGIC A:   Bipolar disorder Anxiety Chronic back pain P:   Valproic acid 500 mg twice a day  Home Risperidone and Remeron Morphine BID  Cont hydroxyzine 25 mg q6.   Start ativan 0/25 mg IV q4 prn for anxiety.  She is at her home meds for anxiety.    FAMILY    Family :.Discussed plan with husband and wife on 11/24.   Plan : transfer to  SDU.  TRH will be primary in am. Dr. Sharon SellerMcClung aware. PCCM will follow given pt's significant lung issues.   CD Konya Fauble, MD 09/05/2016, 9:59 AM [facility] Pager [phone] After 3 pm or if no answer, call [phone]

## 2016-09-06 DIAGNOSIS — F319 Bipolar disorder, unspecified: Secondary | ICD-10-CM | POA: Diagnosis present

## 2016-09-06 DIAGNOSIS — G8929 Other chronic pain: Secondary | ICD-10-CM | POA: Diagnosis present

## 2016-09-06 DIAGNOSIS — G4733 Obstructive sleep apnea (adult) (pediatric): Secondary | ICD-10-CM | POA: Diagnosis present

## 2016-09-06 DIAGNOSIS — E119 Type 2 diabetes mellitus without complications: Secondary | ICD-10-CM

## 2016-09-06 DIAGNOSIS — E662 Morbid (severe) obesity with alveolar hypoventilation: Secondary | ICD-10-CM | POA: Diagnosis present

## 2016-09-06 DIAGNOSIS — D71 Functional disorders of polymorphonuclear neutrophils: Secondary | ICD-10-CM | POA: Diagnosis present

## 2016-09-06 DIAGNOSIS — F419 Anxiety disorder, unspecified: Secondary | ICD-10-CM | POA: Diagnosis present

## 2016-09-06 DIAGNOSIS — J441 Chronic obstructive pulmonary disease with (acute) exacerbation: Secondary | ICD-10-CM | POA: Diagnosis present

## 2016-09-06 DIAGNOSIS — M549 Dorsalgia, unspecified: Secondary | ICD-10-CM

## 2016-09-06 DIAGNOSIS — I5030 Unspecified diastolic (congestive) heart failure: Secondary | ICD-10-CM | POA: Diagnosis present

## 2016-09-06 DIAGNOSIS — I10 Essential (primary) hypertension: Secondary | ICD-10-CM | POA: Diagnosis present

## 2016-09-06 LAB — CBC
HEMATOCRIT: 35.5 % — AB (ref 36.0–46.0)
HEMOGLOBIN: 10.9 g/dL — AB (ref 12.0–15.0)
MCH: 26 pg (ref 26.0–34.0)
MCHC: 30.7 g/dL (ref 30.0–36.0)
MCV: 84.5 fL (ref 78.0–100.0)
Platelets: 207 10*3/uL (ref 150–400)
RBC: 4.2 MIL/uL (ref 3.87–5.11)
RDW: 16.9 % — ABNORMAL HIGH (ref 11.5–15.5)
WBC: 11 10*3/uL — AB (ref 4.0–10.5)

## 2016-09-06 LAB — GLUCOSE, CAPILLARY
GLUCOSE-CAPILLARY: 116 mg/dL — AB (ref 65–99)
Glucose-Capillary: 154 mg/dL — ABNORMAL HIGH (ref 65–99)
Glucose-Capillary: 155 mg/dL — ABNORMAL HIGH (ref 65–99)
Glucose-Capillary: 152 mg/dL — ABNORMAL HIGH (ref 65–99)
Glucose-Capillary: 228 mg/dL — ABNORMAL HIGH (ref 65–99)
Glucose-Capillary: 195 mg/dL — ABNORMAL HIGH (ref 65–99)

## 2016-09-06 LAB — COMPREHENSIVE METABOLIC PANEL
ALBUMIN: 3.4 g/dL — AB (ref 3.5–5.0)
ALT: 37 U/L (ref 14–54)
AST: 15 U/L (ref 15–41)
Alkaline Phosphatase: 34 U/L — ABNORMAL LOW (ref 38–126)
Anion gap: 12 (ref 5–15)
BUN: 17 mg/dL (ref 6–20)
CHLORIDE: 89 mmol/L — AB (ref 101–111)
CO2: 34 mmol/L — AB (ref 22–32)
CREATININE: 0.42 mg/dL — AB (ref 0.44–1.00)
Calcium: 9.2 mg/dL (ref 8.9–10.3)
GFR calc non Af Amer: >60 mL/min (ref >60)
Glucose, Bld: 162 mg/dL — ABNORMAL HIGH (ref 65–99)
Potassium: 3.4 mmol/L — ABNORMAL LOW (ref 3.5–5.1)
SODIUM: 135 mmol/L (ref 135–145)
Total Bilirubin: 0.3 mg/dL (ref 0.3–1.2)
Total Protein: 6 g/dL — ABNORMAL LOW (ref 6.5–8.1)

## 2016-09-06 LAB — PHOSPHORUS: PHOSPHORUS: 3.3 mg/dL (ref 2.5–4.6)

## 2016-09-06 LAB — MAGNESIUM: Magnesium: 2 mg/dL (ref 1.7–2.4)

## 2016-09-06 MED ORDER — INSULIN ASPART 100 UNIT/ML ~~LOC~~ SOLN: 0.0000 [IU] | Freq: Three times a day (TID) | SUBCUTANEOUS | Status: DC

## 2016-09-06 MED ORDER — INSULIN ASPART 100 UNIT/ML ~~LOC~~ SOLN
5.0000 [IU] | Freq: Three times a day (TID) | SUBCUTANEOUS | Status: DC
  Administered 2016-09-06 – 2016-09-09 (×10): 5 [IU] via SUBCUTANEOUS

## 2016-09-06 MED ORDER — RISPERIDONE 0.5 MG PO TABS
2.0000 mg | ORAL_TABLET | Freq: Two times a day (BID) | ORAL | Status: DC
  Administered 2016-09-06 – 2016-09-09 (×6): 2 mg via ORAL
  Filled 2016-09-06 (×6): qty 4

## 2016-09-06 MED ORDER — ATORVASTATIN CALCIUM 10 MG PO TABS
10.0000 mg | ORAL_TABLET | Freq: Every day | ORAL | Status: DC
Start: 2016-09-06 — End: 2016-09-09
  Administered 2016-09-06 – 2016-09-09 (×4): 10 mg via ORAL
  Filled 2016-09-06 (×4): qty 1

## 2016-09-06 MED ORDER — MORPHINE SULFATE 15 MG PO TABS
15.0000 mg | ORAL_TABLET | Freq: Two times a day (BID) | ORAL | Status: DC
  Administered 2016-09-06 – 2016-09-09 (×7): 15 mg via ORAL
  Filled 2016-09-06 (×6): qty 1

## 2016-09-06 MED ORDER — INSULIN ASPART 100 UNIT/ML ~~LOC~~ SOLN
0.0000 [IU] | Freq: Three times a day (TID) | SUBCUTANEOUS | Status: DC
  Administered 2016-09-06 (×2): 4 [IU] via SUBCUTANEOUS
  Administered 2016-09-07: 3 [IU] via SUBCUTANEOUS
  Administered 2016-09-07: 4 [IU] via SUBCUTANEOUS
  Administered 2016-09-08 – 2016-09-09 (×2): 3 [IU] via SUBCUTANEOUS

## 2016-09-06 MED ORDER — INSULIN ASPART 100 UNIT/ML ~~LOC~~ SOLN
0.0000 [IU] | Freq: Every day | SUBCUTANEOUS | Status: DC
  Administered 2016-09-06: 2 [IU] via SUBCUTANEOUS
  Administered 2016-09-07: 4 [IU] via SUBCUTANEOUS

## 2016-09-06 MED ORDER — LABETALOL HCL 200 MG PO TABS
300.0000 mg | ORAL_TABLET | Freq: Three times a day (TID) | ORAL | Status: DC
Start: 2016-09-06 — End: 2016-09-09
  Administered 2016-09-06 – 2016-09-09 (×10): 300 mg via ORAL
  Filled 2016-09-06 (×10): qty 2

## 2016-09-06 MED ORDER — VALPROATE SODIUM 250 MG/5ML PO SOLN
500.0000 mg | Freq: Two times a day (BID) | ORAL | Status: DC
  Administered 2016-09-06 – 2016-09-09 (×6): 500 mg via ORAL
  Filled 2016-09-06 (×6): qty 10

## 2016-09-06 MED ORDER — POTASSIUM CHLORIDE CRYS ER 20 MEQ PO TBCR
40.0000 meq | EXTENDED_RELEASE_TABLET | Freq: Once | ORAL | Status: AC
  Administered 2016-09-06: 40 meq via ORAL
  Filled 2016-09-06: qty 2

## 2016-09-06 MED ORDER — INSULIN GLARGINE 100 UNIT/ML ~~LOC~~ SOLN
14.0000 [IU] | SUBCUTANEOUS | Status: DC
  Administered 2016-09-07 – 2016-09-09 (×3): 14 [IU] via SUBCUTANEOUS
  Filled 2016-09-06 (×4): qty 0.14

## 2016-09-06 MED ORDER — IPRATROPIUM-ALBUTEROL 0.5-2.5 (3) MG/3ML IN SOLN
3.0000 mL | Freq: Four times a day (QID) | RESPIRATORY_TRACT | Status: DC
  Administered 2016-09-06 – 2016-09-09 (×15): 3 mL via RESPIRATORY_TRACT
  Filled 2016-09-06 (×16): qty 3

## 2016-09-06 MED ORDER — MIRTAZAPINE 30 MG PO TBDP
30.0000 mg | ORAL_TABLET | Freq: Every day | ORAL | Status: DC
  Administered 2016-09-06 – 2016-09-08 (×3): 30 mg via ORAL
  Filled 2016-09-06 (×4): qty 1

## 2016-09-06 MED ORDER — INSULIN ASPART 100 UNIT/ML ~~LOC~~ SOLN
3.0000 [IU] | Freq: Three times a day (TID) | SUBCUTANEOUS | Status: DC
  Administered 2016-09-06: 3 [IU] via SUBCUTANEOUS

## 2016-09-06 MED ORDER — SULFAMETHOXAZOLE-TRIMETHOPRIM 800-160 MG PO TABS
1.0000 | ORAL_TABLET | Freq: Two times a day (BID) | ORAL | Status: DC
  Administered 2016-09-06 – 2016-09-09 (×7): 1 via ORAL
  Filled 2016-09-06 (×8): qty 1

## 2016-09-06 NOTE — Progress Notes (Signed)
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 On 08/22/2016, patient presented to [facility] with dyspnea, dry cough and subjective fever and hypoxia to 70's. Initial ABG was 7.4/66/45/41 at [facility]. CBC significant for mild leukocytosis. BMP remarkable for hypoosmolar hyponatremia to 128 otherwise within normal limits. UA was remarkable for pyuria but urine culture was negative. She was started on Solu-Medrol, DuoNeb, Pulmicort and guanfacine-DM for COPD exacerbation. However, she continued to have worsening dyspnea with increased oxygen requirement on BiPAP continuously. She was also started on cefepime and vancomycin on 08/24/2016. Given lack of improvement, patient was transferred to [facility] around 11/16. On 11/17, patient was intubated, she also underwent a CT angiogram of the chest which was negative for PE. She had a bronchoscopy on 11/18. Only positive micro was positive RSV on a respiratory virus panel. She eventually was able to be liberated from the ventilator and was extubated on 11/24, and TRH to call her on 11/26. Pulmonary continues to round on the patient given complex pulmonary history.  SIGNIFICANT EVENTS: 11/12 - Admitted to [facility] where his acute on chronic COPD exacerbation and hypoosmolar hyponatremia 11/14 - Started on vancomycin and cefepime 11/16 - Transferred to [facility] ICU for possible tracheostomy due to continuous need for BiPAP and increased oxygen requirement.  11/17 - Intubated. CT chest negative for PE.  11/18 - Bronch RML, no DAH, some erthyema  blood, BAL sent, tolerated 11/19 - versed drip required 11/24 - extubated 11/26 - TRH primary  Assessment & Plan:   Principal Problem:   Acute respiratory failure with hypoxia (HCC) Active Problems:   Dyspnea   CAP (community acquired pneumonia)   Acute pulmonary edema (HCC)   COPD exacerbation (HCC)   Granulomatous disease (HCC)   Diastolic heart failure (HCC)   DM (diabetes mellitus) (HCC)   HTN (hypertension)   Bipolar disorder (HCC)   Anxiety   Chronic back pain   Acute on chronic hypoxic and hypercapnic respiratory failure multifactorial due to advanced COPD, OSA, OHS - Progressed to VDRF during this hospitalization, now extubated and back on 3 L nasal cannula which is her home O2.  - BiPAP when necessary daytime and as needed daily at bedtime.  - Patient currently is on Pulmicort twice daily, DuoNeb scheduled every 6 hours, prednisone taper by pulmonology  - Will need sleep study as outpatient   Acute pulmonary edema - This appears resolved with IV lasix  - Lasix 40mg  PO daily   Chronic granulomatous disease - On Bactrim prophylaxis, continue  - Seen by immunologist in outpatient   Acute on chronic diastolic CHF - Currently fluid status improving, Lasix PO   DM - Lantus 14 U, Novolog 5U scheduled plus SSI  HTN - On labetalol, blood pressure well controlled  CAP - CT of the chest with concern for pneumonia. Finished a course of antibiotics. RSV B positive  Bipolar / Anxiety - Continue home medications  Chronic back pain - Continue home medication   DVT prophylaxis: Lovenox  Code Status: Full Family Communication: no family bedside Disposition Plan: PT/OT eval pending   Consultants:   Pulmonology   Procedures:  2D echo Study Conclusions - Left ventricle: The cavity size was normal. There was moderateconcentric hypertrophy. Systolic function was vigorous. Theestimated ejection fraction was in the range of 65% to 70%. Wallmotion  was normal; there were no regional wall motionabnormalities. Doppler parameters are consistent with abnormalleft ventricular relaxation (grade 1 diastolic dysfunction). - Aortic valve: Valve area (VTI): 1.45 cm^2. Valve area (Vmax):1.27 cm^2. Valve area (Vmean): 1.29 cm^2. - Mitral valve: Calcified annulus. - Left atrium: The atrium was mildly dilated. - Right ventricle: The cavity size was normal. Wall thickness wasmildly increased. - Pericardium, extracardiac: A trivial pericardial effusion wasidentified posterior to the heart.  Antimicrobials:  cefepime 11/14-11/16  vancomycin 11/14-11/16  rocephin 11/21-11/24   Consultants:   PCCM  Antimicrobials:  Anti-infectives    Start     Dose/Rate Route Frequency Ordered Stop   09/06/16 1200  sulfamethoxazole-trimethoprim (BACTRIM DS,SEPTRA DS) 800-160 MG per tablet 1 tablet     1 tablet Oral 2 times daily 09/06/16 1125     09/03/16 1200  sulfamethoxazole-trimethoprim (BACTRIM) 160 mg in dextrose 5 % 250 mL IVPB  Status:  Discontinued     160 mg 260 mL/hr over 60 Minutes Intravenous Every 12 hours 09/03/16 1103 09/06/16 1124   09/01/16 1000  sulfamethoxazole-trimethoprim (BACTRIM,SEPTRA) 200-40 MG/5ML suspension 20 mL  Status:  Discontinued     20 mL Per Tube Every 12 hours 09/01/16 0848 09/03/16 1103   08/31/16 1600  cefTRIAXone (ROCEPHIN) 1 g in dextrose 5 % 50 mL IVPB     1 g 100 mL/hr over 30 Minutes Intravenous Every 24 hours 08/31/16 1545 09/02/16 1725     Subjective: Feeling much better today. On [place] O2 at baseline. Continues to cough  Objective: Vitals:   09/06/16 0650 09/06/16 0802 09/06/16 0854 09/06/16 0855  BP:  122/67    Pulse: 93     Resp: 18 15    Temp:  98.3 F (36.8 C)    TempSrc:      SpO2: 97% 91% 92% 91%  Weight:      Height:        Intake/Output Summary (Last 24 hours) at 09/06/16 1132 Last data filed at 09/06/16 0959  Gross per 24 hour  Intake             1660 ml  Output              500 ml    Net             1160 ml   Filed Weights   09/04/16 1821 09/05/16 0400 09/06/16 0100  Weight: 77.8 kg (171 lb 8.3 oz) 78.1 kg (172 lb 2.9 oz) 79.7 kg (175 lb 11.3 oz)    Examination:  General exam: Appears calm and comfortable  Respiratory system: Diminished breath sounds, crackles right base, on [place] O2  Cardiovascular system: S1 & S2 heard, RRR. No JVD, murmurs, rubs, gallops or clicks. No pedal edema. Gastrointestinal system: Abdomen is nondistended, soft and nontender. No organomegaly or masses felt. Normal bowel sounds heard. Central nervous system: Alert and oriented. No focal neurological deficits. Extremities: Symmetric 5 x 5 power. Skin: No rashes, lesions or ulcers Psychiatry: Judgement and insight appear normal. Mood & affect appropriate.   Data Reviewed: I have personally reviewed following labs and imaging studies  CBC:  Recent Labs Lab 09/02/16 0242 09/03/16 0214 09/04/16 0309 09/05/16 0312 09/06/16 0212  WBC 9.6 9.5 12.3* 12.9* 11.0*  HGB 10.0* 10.4* 11.0* 11.2* 10.9*  HCT 34.7* 34.3* 35.5* 36.6  35.5*  MCV 87.6 85.8 83.7 83.8 84.5  PLT 175 199 242 236 207   Basic Metabolic Panel:  Recent Labs Lab 09/02/16 0242 09/02/16 1737 09/03/16 0214 09/04/16 0309 09/05/16 0312 09/06/16 0212  NA 142 135 137 135 136 135  K 3.5 5.0 4.4 4.4 3.5 3.4*  CL 93* 90* 91* 91* 88* 89*  CO2 40* 32 36* 35* 37* 34*  GLUCOSE 92 186* 165* 107* 110* 162*  BUN 27* 26* 26* 22* 19 17  CREATININE 0.36* 0.34* 0.34* 0.33* 0.42* 0.42*  CALCIUM 9.5 9.2 9.4 9.6 9.5 9.2  MG 2.0  --  2.2 2.3 2.1 2.0  PHOS 4.2  --  4.3 4.4 3.9 3.3   GFR: Estimated Creatinine Clearance: 77.8 mL/min (by C-G formula based on SCr of 0.42 mg/dL (L)). Liver Function Tests:  Recent Labs Lab 09/06/16 0212  AST 15  ALT 37  ALKPHOS 34*  BILITOT 0.3  PROT 6.0*  ALBUMIN 3.4*   No results for input(s): LIPASE, AMYLASE in the last 168 hours. No results for input(s): AMMONIA in the last 168  hours. Coagulation Profile: No results for input(s): INR, PROTIME in the last 168 hours. Cardiac Enzymes: No results for input(s): CKTOTAL, CKMB, CKMBINDEX, TROPONINI in the last 168 hours. BNP (last 3 results) No results for input(s): PROBNP in the last 8760 hours. HbA1C: No results for input(s): HGBA1C in the last 72 hours. CBG:  Recent Labs Lab 09/05/16 1621 09/05/16 1951 09/05/16 2313 09/06/16 0300 09/06/16 0726  GLUCAP 258* 105* 291* 154* 116*   Lipid Profile:  Recent Labs  09/04/16 0309  TRIG 198*   Thyroid Function Tests: No results for input(s): TSH, T4TOTAL, FREET4, T3FREE, THYROIDAB in the last 72 hours. Anemia Panel: No results for input(s): VITAMINB12, FOLATE, FERRITIN, TIBC, IRON, RETICCTPCT in the last 72 hours. Sepsis Labs: No results for input(s): PROCALCITON, LATICACIDVEN in the last 168 hours.  Recent Results (from the past 240 hour(s))  Respiratory virus panel     Status: Abnormal   Collection Time: 08/28/16 11:15 AM  Result Value Ref Range Status   Source - RVPAN BRONCHIAL ALVEOLAR LAVAGE  Final   Respiratory Syncytial Virus A Negative Negative Final   Respiratory Syncytial Virus B Positive (A) Negative Final    Comment:                   Client Requested Flag   Influenza A Negative Negative Final   Influenza B Negative Negative Final   Parainfluenza 1 Negative Negative Final   Parainfluenza 2 Negative Negative Final   Parainfluenza 3 Negative Negative Final   Metapneumovirus Negative Negative Final   Rhinovirus Negative Negative Final   Adenovirus Negative Negative Final    Comment: (NOTE) Performed At: [facility] [address] [place], [place] [id number] Mila HomerHancock William F MD NW:[id number]:[phone]   Culture, respiratory (NON-Expectorated)     Status: None   Collection Time: 08/28/16 11:15 AM  Result Value Ref Range Status   Specimen Description BRONCHIAL ALVEOLAR LAVAGE  Final   Special Requests NONE  Final   Gram Stain   Final     FEW WBC PRESENT, PREDOMINANTLY MONONUCLEAR NO ORGANISMS SEEN    Culture Consistent with normal respiratory flora.  Final   Report Status 08/30/2016 FINAL  Final  Culture, Urine     Status: Abnormal   Collection Time: 09/01/16  8:51 AM  Result Value Ref Range Status   Specimen Description URINE, CATHETERIZED  Final   Special Requests NONE  Final   Culture 10,000 COLONIES/mL YEAST (A)  Final   Report Status 09/02/2016 FINAL  Final       Radiology Studies: No results found.    Scheduled Meds: . atorvastatin  10 mg Oral Daily  . budesonide (PULMICORT) nebulizer solution  0.5 mg Nebulization BID  . chlorhexidine  15 mL Mouth Rinse BID  . furosemide  40 mg Oral Daily  . insulin aspart  0-20 Units Subcutaneous TID WC  . insulin aspart  0-5 Units Subcutaneous QHS  . insulin aspart  5 Units Subcutaneous TID WC  . [START ON 09/07/2016] insulin glargine  14 Units Subcutaneous Q24H  . ipratropium-albuterol  3 mL Nebulization QID  . labetalol  300 mg Oral TID  . mouth rinse  15 mL Mouth Rinse q12n4p  . mirtazapine  30 mg Oral QHS  . morphine  15 mg Oral q12n4p  . predniSONE  10 mg Oral 3 x daily with food  . [START ON 09/07/2016] predniSONE  10 mg Oral 4X daily taper  . predniSONE  20 mg Oral Nightly  . risperiDONE  2 mg Oral BID  . sulfamethoxazole-trimethoprim  1 tablet Oral BID  . Valproate Sodium  500 mg Oral BID   Continuous Infusions:   LOS: 11 days    Time spent: 40 minutes   Noralee Stain, DO [facility] [email or web address] Password [facility] 09/06/2016, 11:32 AM

## 2016-09-06 NOTE — Care Management Important Message (Signed)
Important Message  Patient Details  Name: Adella NissenJonce Ann Reinwald MRN: [id number] Date of Birth: 09/24/1966   Medicare Important Message Given:  Yes    [name], Alpha Chouinard [name] 09/06/2016, 11:49 AM

## 2016-09-06 NOTE — Progress Notes (Signed)
Nutrition Follow-up  DOCUMENTATION CODES:   Obesity unspecified  INTERVENTION:  Order soft meats with gravy/sauce   NUTRITION DIAGNOSIS:   Inadequate oral intake related to inability to eat as evidenced by NPO status.  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  Pt was extubated on 11/24 and diet was advanced to Carb Modified on 11/25. Pt states that her appetite is good and she is swallowing fine except she needs to chop up her meats. She reports having difficulty talking and therefore difficulty ordering meals. Her husband assisted her in ordering meals today. RD discussed carb modified diet and reviewed what menu items are allowed. Per nursing notes, pt is eating 75% to 100% of meals. Pt denies any additional nutrition questions or needs at this time.   Labs: glucose ranging 116 to 291 mg/dL, low chloride, low hemoglobin  Diet Order:  Diet Carb Modified Fluid consistency: Thin; Room service appropriate? Yes  Skin:  Reviewed, no issues  Last BM:  11/26  Height:   Ht Readings from Last 1 Encounters:  09/04/16 5' (1.524 m)    Weight:   Wt Readings from Last 1 Encounters:  09/06/16 175 lb 11.3 oz (79.7 kg)    Ideal Body Weight:  45.5 kg  BMI:  Body mass index is 34.32 kg/m.  Estimated Nutritional Needs:   Kcal:  1600-1800  Protein:  100-110 grams  Fluid:  1.8 L/day  EDUCATION NEEDS:   No education needs identified at this time  [place], CSP, LDN Inpatient  Clinical Dietitian Pager: [phone] After Hours Pager: [phone]

## 2016-09-06 NOTE — Progress Notes (Signed)
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  On 08/22/2016, patient presented to [facility] with dyspnea, dry cough and subjective fever and hypoxia to 70's. Initial ABG was 7.4/66/45/41 [facility]. CBC significant for mild leukocytosis. BMP remarkable for hypoosmolar hyponatremia to 128 otherwise within normal limits. UA was remarkable for pyuria but urine culture was negative. She was started on Solu-Medrol, DuoNeb, Pulmicort and guanfacine-DM for COPD exacerbation. However, she continued to have worsening dyspnea with increased oxygen requirement on BiPAP continuously. She was also started on cefepime and vancomycin on 08/24/2016.  On the day of transfer, vitals and was normal limits except for tachypnea to 25, ABG was 7.4/65/75/41/ Last BMP within normal limits. Mild leukocytosis and hyponatremia resolved.   On arrival, patient is on BiPAP with some increased work of breathing. She reports headache at the site where the the BiPAP mask rests on her forehead, denies chest pain, admits shortness of breath, denies abdominal pain, denies dysuria and admits chronic back pain. She reports swelling in the right foot and right calf for about a month prior to presentation to [facility] that has resolved.  Patient likes to have intubation or trach done.   SIGNIFICANT EVENTS: 08/22/2016: Ad[facility] where his acute on chronic COPD exacerbation and hypoosmolar hyponatremia 08/24/2016: Started on vancomycin and  cefepime 08/26/2016: [facility] for possible tracheostomy due to continuous need for BiPAP and increased oxygen requirement.  08/27/2016: Intubated. CT chest negative for PE.  11/18- Bronch RML, no DAH, some erthyema blood, BAL sent, tolerated 11/19- versed drip required  SUBJECTIVE:  Extubated on 11/24 and doing well considering her chronic SOB over the last yr.  Using hospital BiPaP > needed more O2 with bipap 2/2 desatn On 6L [place] now > dec to 3 L and o2 sats 90-93%  VITAL SIGNS: BP (!) 113/58   Pulse 80   Temp 98.2 F (36.8 C)   Resp (!) 21   Ht 5' (1.524 m)   Wt 79.7 kg (175 lb 11.3 oz)   SpO2 93%   BMI 34.32 kg/m   HEMODYNAMICS:    VENTILATOR SETTINGS:    INTAKE / OUTPUT: I/O last 3 completed shifts: In: 1860 [P.O.:1080; IV Piggyback:780] Out: 1425 [Urine:1425]  PHYSICAL EXAMINATION: GEN: lying in bed. Awake. Calm. Occasionally anxious Oropharynx: Weak/ hoarse voice.  CVS: s1 s2 RRT RESP: Coarse breath sounds, unlabored GI: Bowel sounds present and normal, soft, non-tender,non-distended GU:  NEURO: awake, follows commands. (-) lateralizing signs.   LABS:  BMET  Recent Labs Lab 09/04/16 0309 09/05/16 0312 09/06/16 0212  NA 135 136 135  K 4.4 3.5 3.4*  CL 91* 88* 89*  CO2 35* 37* 34*  BUN 22* 19 17  CREATININE 0.33* 0.42* 0.42*  GLUCOSE 107* 110* 162*    Electrolytes  Recent Labs Lab 09/04/16 0309 09/05/16 0312 09/06/16 0212  CALCIUM 9.6 9.5 9.2  MG 2.3 2.1 2.0  PHOS 4.4 3.9 3.3    CBC  Recent Labs Lab 09/04/16 0309 09/05/16 0312 09/06/16 0212  WBC 12.3* 12.9* 11.0*  HGB 11.0* 11.2* 10.9*  HCT 35.5* 36.6 35.5*  PLT 242 236 207    Coag's No results for input(s): APTT, INR in the last 168 hours.  Sepsis Markers No results for input(s): LATICACIDVEN, PROCALCITON, O2SATVEN in the last 168 hours.  ABG No results for input(s): PHART, PCO2ART, PO2ART in the last 168 hours.  Liver Enzymes  Recent Labs Lab  09/06/16 0212  AST 15  ALT 37  ALKPHOS 34*  BILITOT 0.3  ALBUMIN 3.4*    Cardiac Enzymes No results for input(s): TROPONINI, PROBNP in the last 168 hours.  Glucose  Recent Labs Lab 09/05/16 1621 09/05/16 1951 09/05/16 2313 09/06/16 0300 09/06/16 0726 09/06/16 1140  GLUCAP 258* 105* 291* 154* 116* 155*    Imaging No results found.   STUDIES:  11/16: LE doppler negative for DVT  11/18 Bronch , RML, no DAH, some erythema blood rml  CULTURES: Culture at OSH negative BAL RML11/18>>>re-incubated for better growth  Urine cx: 10k colonies of yeast   ANTIBIOTICS: 08/24/2016 cefepime 08/26/2016 08/24/2016 vancomycin 08/26/2016 11/21 rocephin > 11/24  LINES/TUBES: Foley 11/21 >>  11/17 ett>>>11/24  DISCUSSION: Cheryl NissenJonce Ann Strole is a 50 year old female with ?end-stage COPD on 3 L ( She says 2 L at home) oxygen and BiPAP at home [facility] for Acute on chronic respiratory failure due to COPD exacerbation. Pt known to have CGD.   ASSESSMENT / PLAN:  Acute on chronic hypoxemic hypercapneic resp fx 2/2 AECOPD, OSA, OHS, RUL PNA, Pulm edema H/o  End-stage COPD on 3 L & nocturnal BiPAP No PE or DVT  Extubated on 11/24  On 3L o2 now. Residual hoarseness w/ stridor Pt with CGD. On Bactrim for PCP prophylaxis. Anxiety.   P:   Bipap prn daytime and needs at HS.  Will need a sleep study as outpt to determine best pressure.  Told pt to bring her BipaP tomorrow so she can use her own Bipap. On Bipap 14/7 currently. Bleed o2 to keep o2 sats > 88% O2 3L now 24/7. Try to cut down to keep o2 sats > 88% DuoNeb q6h, pulmicort BID > may need to d/c with hoarseness Albuterol nebs q2h prn  Steroids > changed to prednisone for taper Diuresce as able DVT prophylaxis Control anxiety. Seems stable now.   Pollie MeyerJ. Angelo A de Dios, MD 09/06/2016, 4:03 PM [facility] Pager [phone] After 3 pm or if no answer, call  [phone]

## 2016-09-06 NOTE — Progress Notes (Signed)
Inpatient Diabetes Program Recommendations  AACE/ADA: New Consensus Statement on Inpatient Glycemic Control (2015)  Target Ranges:  Prepandial:   less than 140 mg/dL      Peak postprandial:   less than 180 mg/dL (1-2 hours)      Critically ill patients:  140 - 180 mg/dL   Results for Cheryl Wheeler, [name] (MRN [id number]) as of 09/06/2016 11:00  Ref. Range 09/05/2016 04:07 09/05/2016 08:08 09/05/2016 12:51 09/05/2016 16:21 09/05/2016 19:51 09/05/2016 23:13 09/06/2016 03:00 09/06/2016 07:26  Glucose-Capillary Latest Ref Range: 65 - 99 mg/dL 85 84 409256 (H) 811258 (H) 914105 (H) 291 (H) 154 (H) 116 (H)   Review of Glycemic Control  Current orders for Inpatient glycemic control: Lantus 10 units Q24H, Novolog 0-20 units TID with meals, Novolog 3 units TID with meals  Inpatient Diabetes Program Recommendations: Insulin - Basal: Please consider increasing Lantus to 14 units Q24H. Correction (SSI): Please consider ordering Novolog bedtime correction scale. Insulin - Meal Coverage: Please consider increasing meal coverage to Novolog 5 units TID with meals.  Thanks, Orlando PennerMarie Elliana Bal, RN, MSN, CDE Diabetes Coordinator Inpatient Diabetes Program [phone] (Team Pager from 8am to 5pm)

## 2016-09-07 DIAGNOSIS — G7281 Critical illness myopathy: Secondary | ICD-10-CM

## 2016-09-07 LAB — BASIC METABOLIC PANEL
ANION GAP: 9 (ref 5–15)
BUN: 15 mg/dL (ref 6–20)
CALCIUM: 9.1 mg/dL (ref 8.9–10.3)
CO2: 35 mmol/L — AB (ref 22–32)
CREATININE: 0.39 mg/dL — AB (ref 0.44–1.00)
Chloride: 92 mmol/L — ABNORMAL LOW (ref 101–111)
GFR calc non Af Amer: >60 mL/min (ref >60)
Glucose, Bld: 152 mg/dL — ABNORMAL HIGH (ref 65–99)
Potassium: 4.3 mmol/L (ref 3.5–5.1)
SODIUM: 136 mmol/L (ref 135–145)

## 2016-09-07 LAB — CBC
HEMATOCRIT: 35.1 % — AB (ref 36.0–46.0)
HEMOGLOBIN: 10.8 g/dL — AB (ref 12.0–15.0)
MCH: 26.1 pg (ref 26.0–34.0)
MCHC: 30.8 g/dL (ref 30.0–36.0)
MCV: 84.8 fL (ref 78.0–100.0)
Platelets: 210 10*3/uL (ref 150–400)
RBC: 4.14 MIL/uL (ref 3.87–5.11)
RDW: 17.3 % — AB (ref 11.5–15.5)
WBC: 9.4 10*3/uL (ref 4.0–10.5)

## 2016-09-07 LAB — GLUCOSE, CAPILLARY
GLUCOSE-CAPILLARY: 107 mg/dL — AB (ref 65–99)
GLUCOSE-CAPILLARY: 94 mg/dL (ref 65–99)
GLUCOSE-CAPILLARY: 160 mg/dL — AB (ref 65–99)
Glucose-Capillary: 173 mg/dL — ABNORMAL HIGH (ref 65–99)

## 2016-09-07 MED ORDER — ACETAMINOPHEN 325 MG PO TABS
650.0000 mg | ORAL_TABLET | Freq: Four times a day (QID) | ORAL | Status: DC | PRN
  Administered 2016-09-07 – 2016-09-09 (×6): 650 mg via ORAL
  Filled 2016-09-07 (×6): qty 2

## 2016-09-07 NOTE — Progress Notes (Signed)
Physical Therapy Treatment Patient Details Name: Cheryl Wheeler MRN: [id number] DOB: [birth year] Today's Date: 09/07/2016    History of Present Illness [facility] with vent dependent respiratory failure. Pt intubated 11/17 and extubated 11/24. PMH - COPD, chronic granulomatous disease, hypertension, bipolar disorder, anxiety.     PT Comments    Pt demonstrating muscle fatigue with repeated mobility. Remains motivated to improve independence.  Follow Up Recommendations  CIR     Equipment Recommendations  None recommended by PT    Recommendations for Other Services Rehab consult     Precautions / Restrictions Precautions Precautions: Fall Restrictions Weight Bearing Restrictions: No    Mobility  Bed Mobility Overal bed mobility: Needs Assistance Bed Mobility: Supine to Sit     Supine to sit: Min assist     General bed mobility comments: Pt up in chair  Transfers Overall transfer level: Needs assistance Equipment used: Ambulation equipment used Transfers: Sit to/from BJ'sStand;Stand Pivot Transfers Sit to Stand: Mod assist;+2 safety/equipment;Max assist Stand pivot transfers: Mod assist;+2 physical assistance       General transfer comment: Heavy assist to bring hips up from low chair.  Used Stedy to pivot chair to bsc back to chair.   Ambulation/Gait             General Gait Details: Pt too weak to amb   Stairs            Wheelchair Mobility    Modified Rankin (Stroke Patients Only)       Balance Overall balance assessment: Needs assistance Sitting-balance support: Feet supported Sitting balance-Leahy Scale: Poor Sitting balance - Comments: UE support   Standing balance support: Bilateral upper extremity supported Standing balance-Leahy Scale: Poor Standing balance comment: Stedy and min A to maintain static balance                    Cognition Arousal/Alertness: Awake/alert Behavior During Therapy: WFL for  tasks assessed/performed Overall Cognitive Status: Within Functional Limits for tasks assessed                      Exercises General Exercises - Lower Extremity Long Arc Quad: AROM;Both;10 reps;Seated    General Comments        Pertinent Vitals/Pain Pain Assessment: No/denies pain    Home Living Family/patient expects to be discharged to:: Private residence Living Arrangements: Spouse/significant other Available Help at Discharge: Family;Available 24 hours/day Type of Home: House Home Access: Stairs to enter Entrance Stairs-Rails: Right Home Layout: One level Home Equipment: Walker - 2 wheels;Bedside commode;Shower seat;Wheelchair - manual      Prior Function Level of Independence: Needs assistance  Gait / Transfers Assistance Needed: Modified independent with rolling walker for household distances. Uses w/c if out       PT Goals (current goals can now be found in the care plan section) Acute Rehab PT Goals Patient Stated Goal: to return to independence PT Goal Formulation: With patient/family Time For Goal Achievement: 09/21/16 Potential to Achieve Goals: Good Progress towards PT goals: Progressing toward goals    Frequency    Min 3X/week      PT Plan Current plan remains appropriate    Co-evaluation             End of Session Equipment Utilized During Treatment: Gait belt;Oxygen Activity Tolerance: Patient limited by fatigue Patient left: in chair;with call bell/phone within reach;with family/visitor present     Time: 7829-56211034-1044 PT Time Calculation (min) (ACUTE  ONLY): 10 min  Charges:  $Therapeutic Activity: 8-22 mins                    G CodesAngelina Ok:      Berton Butrick W [facility] 09/07/2016, 11:13 AM Skip Mayerary Jacquise Rarick PT [phone]

## 2016-09-07 NOTE — Progress Notes (Signed)
Pt is on NIV at this time tolerating it well. Settings are in the flowsheet. Pt wants to use hospital provided CPAP machine like she as been doing. Her home unit is in the room in her carrying case bag. No distress noted

## 2016-09-07 NOTE — Plan of Care (Signed)
Problem: Activity: Goal: Risk for activity intolerance will decrease Outcome: Progressing PT arrived to eval of pt mobility. Pt was eager to become active.

## 2016-09-07 NOTE — Progress Notes (Signed)
Pt is on NIV at this time tolerating it well. 14/7 o2 bled into mask

## 2016-09-07 NOTE — Progress Notes (Signed)
Rehab Admissions Coordinator Note:  Patient was screened by Trish MageLogue, Adelisa Satterwhite M for appropriateness for an Inpatient Acute Rehab Consult.  At this time, we are recommending Inpatient Rehab consult.  Trish MageLogue, Aanshi Batchelder M 09/07/2016, 11:48 AM  I can be reached at [phone].

## 2016-09-07 NOTE — Evaluation (Signed)
Physical Therapy Evaluation Patient Details Name: Cheryl Wheeler MRN: [id number] DOB: 1966/09/07 Today's Date: 09/07/2016   History of Present Illness  [facility] with vent dependent respiratory failure. Pt intubated 11/17 and extubated 11/24. PMH - COPD, chronic granulomatous disease, hypertension, bipolar disorder, anxiety.   Clinical Impression  Pt admitted with above diagnosis and presents to PT with functional limitations due to deficits listed below (See PT problem list). Pt needs skilled PT to maximize independence and safety to allow discharge to CIR prior to return home. Pt with significant decline in function after 16 day hospitalization with limited activity. Pt very motivated to improve mobility and function and very supportive husband. Feel with CIR stay will be able to return home at ambulatory level.     Follow Up Recommendations CIR    Equipment Recommendations  None recommended by PT    Recommendations for Other Services Rehab consult     Precautions / Restrictions Precautions Precautions: Fall Restrictions Weight Bearing Restrictions: No      Mobility  Bed Mobility Overal bed mobility: Needs Assistance Bed Mobility: Supine to Sit     Supine to sit: Min assist     General bed mobility comments: Assist to elevate trunk into sitting  Transfers Overall transfer level: Needs assistance Equipment used: Rolling walker (2 wheeled) Transfers: Sit to/from [employer] CorporationStand;Stand Pivot Transfers Sit to Stand: Mod assist;Max assist Stand pivot transfers: Mod assist       General transfer comment: Mod assist to bring hips and trunk up. Used rolling walker to take pivotal steps to chair. From chair stood 2 more times with max assist to rise from low chair. SpO2 86-89% on 4L O2  Ambulation/Gait             General Gait Details: Pt too weak to amb  Stairs            Wheelchair Mobility    Modified Rankin (Stroke Patients Only)        Balance Overall balance assessment: Needs assistance Sitting-balance support: Feet unsupported;Single extremity supported Sitting balance-Leahy Scale: Poor Sitting balance - Comments: UE support   Standing balance support: Bilateral upper extremity supported Standing balance-Leahy Scale: Poor Standing balance comment: walker and min A for static standing                             Pertinent Vitals/Pain Pain Assessment: No/denies pain    Home Living Family/patient expects to be discharged to:: Private residence Living Arrangements: Spouse/significant other Available Help at Discharge: Family;Available 24 hours/day Type of Home: House Home Access: Stairs to enter Entrance Stairs-Rails: Right Entrance Stairs-Number of Steps: 5 Home Layout: One level Home Equipment: Walker - 2 wheels;Bedside commode;Shower seat;Wheelchair - manual      Prior Function Level of Independence: Needs assistance   Gait / Transfers Assistance Needed: Modified independent with rolling walker for household distances. Uses w/c if out           Hand Dominance        Extremity/Trunk Assessment   Upper Extremity Assessment: Defer to OT evaluation           Lower Extremity Assessment: RLE deficits/detail;LLE deficits/detail RLE Deficits / Details: strength grossly 2+/5. Fatigues quickly. LLE Deficits / Details: strength grossly 2+/5. Fatigues quickly.     Communication   Communication: Other (comment) (hoarse voice)  Cognition Arousal/Alertness: Awake/alert Behavior During Therapy: WFL for tasks assessed/performed Overall Cognitive Status: Within Functional Limits  for tasks assessed                      General Comments      Exercises General Exercises - Lower Extremity Long Arc Quad: AROM;Both;10 reps;Seated   Assessment/Plan    PT Assessment Patient needs continued PT services  PT Problem List Decreased strength;Decreased activity tolerance;Decreased  balance;Decreased mobility;Cardiopulmonary status limiting activity          PT Treatment Interventions DME instruction;Gait training;Stair training;Functional mobility training;Therapeutic activities;Therapeutic exercise;Balance training;Patient/family education    PT Goals (Current goals can be found in the Care Plan section)  Acute Rehab PT Goals Patient Stated Goal: to return to independence PT Goal Formulation: With patient/family Time For Goal Achievement: 09/21/16 Potential to Achieve Goals: Good    Frequency Min 3X/week   Barriers to discharge Inaccessible home environment Has 5 steps to enter home    Co-evaluation               End of Session Equipment Utilized During Treatment: Gait belt;Oxygen Activity Tolerance: Patient limited by fatigue Patient left: in chair;with call bell/phone within reach;with family/visitor present Nurse Communication: Mobility status;Need for lift equipment Corene Cornea(Sara Stedy)         Time: 1610-9604: 0945-1020 PT Time Calculation (min) (ACUTE ONLY): 35 min   Charges:   PT Evaluation $PT Eval High Complexity: 1 Procedure PT Treatments $Therapeutic Activity: 8-22 mins   PT G CodesAngelina Ok:        Meshulem Onorato W [facility] 09/07/2016, 11:07 AM Skip Mayerary Cherrish Vitali PT [phone]

## 2016-09-07 NOTE — Plan of Care (Signed)
Problem: Activity: Goal: Risk for activity intolerance will decrease Outcome: Progressing PT to eval pt tommorrow

## 2016-09-07 NOTE — Progress Notes (Signed)
PROGRESS NOTE    Cheryl Wheeler  ZOX:[id number] DOB: 07-16-1966 DOA: 08/26/2016 PCP: Noni SaupeEDDING II,JOHN F., MD     Brief Narrative:  Cheryl CocoJonce Ann Rogersis a 50 year old female with ?end-stage COPD on 3 L oxygen and BiPAP at home, chronic granulomatous disease, hypertension, bipolar disorder, anxiety and GERD who transfered [facility] for possible tracheostomy after needing continuous positive pressure ventilation with BiPAP for 3 days. On 08/22/2016, patient [facility] with dyspnea, dry cough and subjective fever and hypoxia to 70's. Initial ABG was 7.4/66/45/41 at [facility]. CBC significant for mild leukocytosis. BMP remarkable for hypoosmolar hyponatremia to 128 otherwise within normal limits. UA was remarkable for pyuria but urine culture was negative. She was started on Solu-Medrol, DuoNeb, Pulmicort and guanfacine-DM for COPD exacerbation. However, she continued to have worsening dyspnea with increased oxygen requirement on BiPAP continuously. She was also started on cefepime and vancomycin on 08/24/2016. Given lack of improvement, patient [facility] around 11/16. On 11/17, patient was intubated, she also underwent a CT angiogram of the chest which was negative for PE. She had a bronchoscopy on 11/18. Only positive micro was positive RSV on a respiratory virus panel. She eventually was able to be liberated from the ventilator and was extubated on 11/24, and [facility] assumed care on 11/26. Pulmonary continues to round on the patient given complex pulmonary history.  SIGNIFICANT EVENTS: 11/12 - Ad[facility] where his acute on chronic COPD exacerbation and hypoosmolar hyponatremia 11/14 - Started on vancomycin and cefepime 11/16 - [facility] for possible trache[facility] due to continuous need for BiPAP and increased oxygen requirement.  11/17 - Intubated. CT chest negative for PE.  11/18 - Bronch RML, no DAH, some erthyema  blood, BAL sent, tolerated 11/19 - versed drip required 11/24 - extubated 11/26 - [facility] primary  Assessment & Plan:   Principal Problem:   Acute respiratory failure with hypoxia (HCC) Active Problems:   Dyspnea   CAP (community acquired pneumonia)   Acute pulmonary edema (HCC)   COPD exacerbation (HCC)   Granulomatous disease (HCC)   Diastolic heart failure (HCC)   DM (diabetes mellitus) (HCC)   HTN (hypertension)   Bipolar disorder (HCC)   Anxiety   Chronic back pain   OSA (obstructive sleep apnea)   Obesity hypoventilation syndrome (HCC)   Acute on chronic hypoxic and hypercapnic respiratory failure multifactorial due to advanced COPD, OSA, OHS - Progressed to vent-depending respiratory failure during this hospitalization, now extubated and back on 2-3 L nasal cannula which is her home O2 with BiPAP when necessary daytime and as needed daily at bedtime. Patient currently is on Pulmicort twice daily, DuoNeb scheduled every 6 hours, prednisone taper by pulmonology. Will need sleep study as outpatient.   Acute pulmonary edema 2/2 acute on chronic diastolic CHF This appears resolved with IV lasix. Continue Lasix 40mg  PO daily   Chronic granulomatous disease On Bactrim prophylaxis and follows with immunologist as outpatient   DM type 2  Lantus 14 U, Novolog 5U scheduled plus SSI. Appreciate diabetic education.   HTN On labetalol, blood pressure well controlled  CAP CT of the chest with concern for pneumonia. RSV B positive. Finished a course of antibiotics.   Bipolar / Anxiety Continue home medications  Chronic back pain Continue home medication  Deconditioning Due to acute illness. PT/OT evaluation.    DVT prophylaxis: Lovenox  Code Status: Full Family Communication: no family bedside Disposition Plan: PT/OT eval pending, discharge back  home anticipated   Consultants:   Pulmonology   Procedures:   2D echo Study Conclusions - Left ventricle:  The cavity size was normal. There was moderateconcentric hypertrophy. Systolic function was vigorous. Theestimated ejection fraction was in the range of 65% to 70%. Wallmotion was normal; there were no regional wall motionabnormalities. Doppler parameters are consistent with abnormalleft ventricular relaxation (grade 1 diastolic dysfunction). - Aortic valve: Valve area (VTI): 1.45 cm^2. Valve area (Vmax):1.27 cm^2. Valve area (Vmean): 1.29 cm^2. - Mitral valve: Calcified annulus. - Left atrium: The atrium was mildly dilated. - Right ventricle: The cavity size was normal. Wall thickness wasmildly increased. - Pericardium, extracardiac: A trivial pericardial effusion wasidentified posterior to the heart.  Antimicrobials:  Cefepime 11/14-11/16  Vancomycin 11/14-11/16  Rocephin 11/21-11/24   Bactrim for chronic ppx   Subjective: Feeling much better today. On [place] O2 at baseline. Continues to cough, but this is improving as well. No other complaints. Eager to work with PT today   Objective: Vitals:   09/07/16 0608 09/07/16 0811 09/07/16 0827 09/07/16 0832  BP:  110/63    Pulse: 65     Resp: 15     Temp:  97.7 F (36.5 C)    TempSrc:  Oral    SpO2: 94%  90% 90%  Weight:      Height:        Intake/Output Summary (Last 24 hours) at 09/07/16 0915 Last data filed at 09/07/16 0100  Gross per 24 hour  Intake              510 ml  Output                0 ml  Net              510 ml   Filed Weights   09/05/16 0400 09/06/16 0100 09/07/16 0500  Weight: 78.1 kg (172 lb 2.9 oz) 79.7 kg (175 lb 11.3 oz) 79.7 kg (175 lb 11.3 oz)    Examination:  General exam: Appears calm and comfortable  Respiratory system: Diminished breath sounds, crackles right base, on [place] O2  Cardiovascular system: S1 & S2 heard, RRR. No JVD, murmurs, rubs, gallops or clicks. No pedal edema. Gastrointestinal system: Abdomen is nondistended, soft and nontender. No organomegaly or masses felt. Normal bowel  sounds heard. Central nervous system: Alert and oriented. No focal neurological deficits. Extremities: Symmetric 5 x 5 power. Skin: No rashes, lesions or ulcers Psychiatry: Judgement and insight appear normal. Mood & affect appropriate.   Data Reviewed: I have personally reviewed following labs and imaging studies  CBC:  Recent Labs Lab 09/03/16 0214 09/04/16 0309 09/05/16 0312 09/06/16 0212 09/07/16 0238  WBC 9.5 12.3* 12.9* 11.0* 9.4  HGB 10.4* 11.0* 11.2* 10.9* 10.8*  HCT 34.3* 35.5* 36.6 35.5* 35.1*  MCV 85.8 83.7 83.8 84.5 84.8  PLT 199 242 236 207 210   Basic Metabolic Panel:  Recent Labs Lab 09/02/16 0242  09/03/16 0214 09/04/16 0309 09/05/16 0312 09/06/16 0212 09/07/16 0238  NA 142  < > 137 135 136 135 136  K 3.5  < > 4.4 4.4 3.5 3.4* 4.3  CL 93*  < > 91* 91* 88* 89* 92*  CO2 40*  < > 36* 35* 37* 34* 35*  GLUCOSE 92  < > 165* 107* 110* 162* 152*  BUN 27*  < > 26* 22* 19 17 15   CREATININE 0.36*  < > 0.34* 0.33* 0.42* 0.42* 0.39*  CALCIUM 9.5  < > 9.4  9.6 9.5 9.2 9.1  MG 2.0  --  2.2 2.3 2.1 2.0  --   PHOS 4.2  --  4.3 4.4 3.9 3.3  --   < > = values in this interval not displayed. GFR: Estimated Creatinine Clearance: 77.8 mL/min (by C-G formula based on SCr of 0.39 mg/dL (L)). Liver Function Tests:  Recent Labs Lab 09/06/16 0212  AST 15  ALT 37  ALKPHOS 34*  BILITOT 0.3  PROT 6.0*  ALBUMIN 3.4*   No results for input(s): LIPASE, AMYLASE in the last 168 hours. No results for input(s): AMMONIA in the last 168 hours. Coagulation Profile: No results for input(s): INR, PROTIME in the last 168 hours. Cardiac Enzymes: No results for input(s): CKTOTAL, CKMB, CKMBINDEX, TROPONINI in the last 168 hours. BNP (last 3 results) No results for input(s): PROBNP in the last 8760 hours. HbA1C: No results for input(s): HGBA1C in the last 72 hours. CBG:  Recent Labs Lab 09/06/16 1140 09/06/16 1652 09/06/16 2031 09/06/16 2236 09/07/16 0815  GLUCAP 155*  152* 228* 195* 107*   Lipid Profile: No results for input(s): CHOL, HDL, LDLCALC, TRIG, CHOLHDL, LDLDIRECT in the last 72 hours. Thyroid Function Tests: No results for input(s): TSH, T4TOTAL, FREET4, T3FREE, THYROIDAB in the last 72 hours. Anemia Panel: No results for input(s): VITAMINB12, FOLATE, FERRITIN, TIBC, IRON, RETICCTPCT in the last 72 hours. Sepsis Labs: No results for input(s): PROCALCITON, LATICACIDVEN in the last 168 hours.  Recent Results (from the past 240 hour(s))  Respiratory virus panel     Status: Abnormal   Collection Time: 08/28/16 11:15 AM  Result Value Ref Range Status   Source - RVPAN BRONCHIAL ALVEOLAR LAVAGE  Final   Respiratory Syncytial Virus A Negative Negative Final   Respiratory Syncytial Virus B Positive (A) Negative Final    Comment:                   Client Requested Flag   Influenza A Negative Negative Final   Influenza B Negative Negative Final   Parainfluenza 1 Negative Negative Final   Parainfluenza 2 Negative Negative Final   Parainfluenza 3 Negative Negative Final   Metapneumovirus Negative Negative Final   Rhinovirus Negative Negative Final   Adenovirus Negative Negative Final    Comment: (NOTE) Performed At: [facility] [address] [place], [place] [id number] Mila HomerHancock William F MD WU:[id number]:[phone]   Culture, respiratory (NON-Expectorated)     Status: None   Collection Time: 08/28/16 11:15 AM  Result Value Ref Range Status   Specimen Description BRONCHIAL ALVEOLAR LAVAGE  Final   Special Requests NONE  Final   Gram Stain   Final    FEW WBC PRESENT, PREDOMINANTLY MONONUCLEAR NO ORGANISMS SEEN    Culture Consistent with normal respiratory flora.  Final   Report Status 08/30/2016 FINAL  Final  Culture, Urine     Status: Abnormal   Collection Time: 09/01/16  8:51 AM  Result Value Ref Range Status   Specimen Description URINE, CATHETERIZED  Final   Special Requests NONE  Final   Culture 10,000 COLONIES/mL YEAST (A)  Final    Report Status 09/02/2016 FINAL  Final       Radiology Studies: No results found.    Scheduled Meds: . atorvastatin  10 mg Oral Daily  . budesonide (PULMICORT) nebulizer solution  0.5 mg Nebulization BID  . chlorhexidine  15 mL Mouth Rinse BID  . furosemide  40 mg Oral Daily  . insulin aspart  0-20 Units Subcutaneous TID  WC  . insulin aspart  0-5 Units Subcutaneous QHS  . insulin aspart  5 Units Subcutaneous TID WC  . insulin glargine  14 Units Subcutaneous Q24H  . ipratropium-albuterol  3 mL Nebulization QID  . labetalol  300 mg Oral TID  . mouth rinse  15 mL Mouth Rinse q12n4p  . mirtazapine  30 mg Oral QHS  . morphine  15 mg Oral q12n4p  . predniSONE  10 mg Oral 4X daily taper  . risperiDONE  2 mg Oral BID  . sulfamethoxazole-trimethoprim  1 tablet Oral BID  . Valproate Sodium  500 mg Oral BID   Continuous Infusions:   LOS: 12 days    Time spent: 30 minutes   Noralee Stain, DO [facility] [email or web address] Password [email or web address] 09/07/2016, 9:15 AM

## 2016-09-07 NOTE — Progress Notes (Signed)
Occupational Therapy Evaluation Patient Details Name: Cheryl Wheeler MRN: [id number] DOB: 21-Sep-1966 Today's Date: 09/07/2016    History of Present Illness [facility] with vent dependent respiratory failure. Pt intubated 11/17 and extubated 11/24. PMH - COPD, chronic granulomatous disease, hypertension, bipolar disorder, anxiety.    Clinical Impression   PTA, pt modified independent with ADL and mobility @ RW level. On 2L home O2. Pt demonstrates significant functional decline as noted below and will benefit from rehab at CIR to return to PLOF to safely DC home with supportive husband. Pt extremely motivated to participate with OT. Will follow acutely to address established goals.     Follow Up Recommendations  CIR;Supervision/Assistance - 24 hour    Equipment Recommendations  Tub/shower seat (with back)    Recommendations for Other Services Rehab consult     Precautions / Restrictions Precautions Precautions: Fall Restrictions Weight Bearing Restrictions: No      Mobility Bed Mobility Overal bed mobility: Needs Assistance Bed Mobility: Sit to Supine      Sit to supine: Mod assist   General bed mobility comments: assist to lift BLE onto bed  Transfers Overall transfer level: Needs assistance Equipment used:  (stedy) Transfers: Sit to/from UGI CorporationStand;Stand Pivot Transfers Sit to Stand: Min assist;From elevated surface (with pt pulling on lift bar) Stand pivot transfers: Antony SalmonStedy works well at this time. Gives pt more control in her mobility       General transfer comment: Excellent use of stedy to help with mobility.     Balance Overall balance assessment: Needs assistance Sitting-balance support: Feet supported Sitting balance-Leahy Scale: Fair Sitting balance - Comments: UE support   Standing balance support: Bilateral upper extremity supported Standing balance-Leahy Scale: Poor Standing balance comment: Stedy and min A to maintain static  balance                            ADL Overall ADL's : Needs assistance/impaired Eating/Feeding: Modified independent   Grooming: Set up;Sitting   Upper Body Bathing: Minimal assitance;Sitting   Lower Body Bathing: Moderate assistance;Sit to/from stand   Upper Body Dressing : Minimal assistance;Sitting   Lower Body Dressing: Moderate assistance;Sit to/from stand   Toilet Transfer: +2 for physical assistance;Minimal assistance;BSC (with use of stedy)   Toileting- Clothing Manipulation and Hygiene: Total assistance       Functional mobility during ADLs: Minimal assistance;+2 for physical assistance (with stedy) General ADL Comments: Pt very motivated to become more independent     Vision     Perception     Praxis      Pertinent Vitals/Pain Pain Assessment: No/denies pain     Hand Dominance Right   Extremity/Trunk Assessment Upper Extremity Assessment Upper Extremity Assessment: Generalized weakness   Lower Extremity Assessment Lower Extremity Assessment: Defer to PT evaluation RLE Deficits / Details: strength grossly 2+/5. Fatigues quickly. LLE Deficits / Details: strength grossly 2+/5. Fatigues quickly.   Cervical / Trunk Assessment Cervical / Trunk Assessment: Normal   Communication Communication Communication: No difficulties (hoarse voice)   Cognition Arousal/Alertness: Awake/alert Behavior During Therapy: WFL for tasks assessed/performed Overall Cognitive Status: Within Functional Limits for tasks assessed                     General Comments   Pt enjoys crafts/cross stitch/watching [employer] movies    Exercises Exercises: General Upper Extremity     Shoulder Instructions      Home Living  Family/patient expects to be discharged to:: Private residence Living Arrangements: Spouse/significant other Available Help at Discharge: Family;Available 24 hours/day Type of Home: House Home Access: Stairs to enter ITT IndustriesEntrance  Stairs-Number of Steps: 5 Entrance Stairs-Rails: Right Home Layout: One level     Bathroom Shower/Tub: Producer, television/film/videoWalk-in shower   Bathroom Toilet: Standard     Home Equipment: Environmental consultantWalker - 2 wheels;Bedside commode;Shower seat;Wheelchair - manual;Cane - single point          Prior Functioning/Environment Level of Independence: Needs assistance  Gait / Transfers Assistance Needed: Modified independent with rolling walker for household distances. Uses w/c if out ADL's / Homemaking Assistance Needed: mod independent with basic ADL. Husband cooked/cleaned. Became SOB with ADL at times. Used w/c for community ditances at baselind            OT Problem List: Decreased strength;Decreased activity tolerance;Impaired balance (sitting and/or standing);Decreased safety awareness;Decreased knowledge of use of DME or AE;Cardiopulmonary status limiting activity   OT Treatment/Interventions: Self-care/ADL training;Therapeutic exercise;Energy conservation;DME and/or AE instruction;Therapeutic activities;Patient/family education;Balance training    OT Goals(Current goals can be found in the care plan section) Acute Rehab OT Goals Patient Stated Goal: to return to independence OT Goal Formulation: With patient Time For Goal Achievement: 09/21/16 Potential to Achieve Goals: Good ADL Goals Pt Will Perform Upper Body Bathing: with set-up;sitting Pt Will Perform Lower Body Bathing: with set-up;with supervision;sit to/from stand;with adaptive equipment Pt Will Transfer to Toilet: with min guard assist;bedside commode;stand pivot transfer Pt Will Perform Toileting - Clothing Manipulation and hygiene: with supervision;sitting/lateral leans;with set-up Pt/caregiver will Perform Home Exercise Program: Increased strength;Both right and left upper extremity;With written HEP provided;With theraband;With Supervision Additional ADL Goal #1: Pt will demonstrate 3 energy conservation techniques with min vc during ADL task   OT Frequency: Min 2X/week   Barriers to D/C:            Co-evaluation              End of Session Equipment Utilized During Treatment: Gait belt;Oxygen;Other (comment) (4L. Antony SalmonStedy) Nurse Communication: Mobility status  Activity Tolerance: Patient tolerated treatment well Patient left: in bed;with call bell/phone within reach;with bed alarm set;with family/visitor present;with SCD's reapplied   Time: [id number] OT Time Calculation (min): 24 min Charges:  OT General Charges $OT Visit: 1 Procedure OT Evaluation $OT Eval Moderate Complexity: 1 Procedure OT Treatments $Self Care/Home Management : 8-22 mins G-Codes:    Myshawn Chiriboga,HILLARY 09/07/2016, 12:18 PM  [facility] Vinessa Macconnell, OTR/L  [phone] 09/07/2016

## 2016-09-07 NOTE — Consult Note (Signed)
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 right handed female with history of advanced COPD on 2-4 L of oxygen as well as BiPAP at home. Per chart review patient lives with spouse. One level home with 5 steps to entry. Modified independent with rolling walker for household distances prior to admission. Uses a wheelchair outside of the home. Presented to [facility] 08/22/2016 for acute on chronic respiratory failure with low-grade fever and hypoxic in the 70s and required intubation. Findings of mild hyponatremia 128. Started on Solu-Medrol and nebulizer treatments. Transferred to [facility] 08/26/2016 for ongoing medical care. Chest x-ray was negative for acute abnormality. CT angiogram of the chest negative for pulmonary emboli. Maintain on broad-spectrum antibiotics. Pulmonary care service follow-up remain intubated through 09/03/2016. Physical therapy evaluation completed 09/07/2016 with recommendations of physical medicine rehabilitation consult   Review of Systems  Constitutional: Positive for fever. Negative for chills.  HENT: Negative for hearing loss and tinnitus.   Eyes: Negative for blurred vision and double vision.  Respiratory: Positive for cough and wheezing.   Cardiovascular: Positive for leg swelling. Negative for chest pain and palpitations.  Gastrointestinal: Positive for constipation and heartburn. Negative for vomiting.       GERD  Genitourinary: Negative for dysuria, flank pain and hematuria.  Musculoskeletal: Positive for joint pain and myalgias.  Skin: Negative for rash.  Neurological: Positive for weakness. Negative for seizures and loss of consciousness.  Psychiatric/Behavioral:       Anxiety, bipolar disorder  All other systems reviewed and are negative.  Past Medical History:  Diagnosis  Date  . Anxiety   . Bipolar disorder (HCC)   . COPD (chronic obstructive pulmonary disease) (HCC)   . GERD (gastroesophageal reflux disease)   . Hypertension    No past surgical history on file. No family history on file. Social History:  has no tobacco, alcohol, and drug history on file. Allergies:  Allergies  Allergen Reactions  . Fentanyl Shortness Of Breath  . Meperidine Other (See Comments)    chest pain-"feels like squeezing heart"  . Azithromycin Other (See Comments)    VRE  . Codeine Nausea Only  . Levofloxacin Other (See Comments)    Unknown  . Other Other (See Comments)    BLACK STICHES=BODY REJECTS THEM  . Penicillins Rash  . Sulfa Antibiotics Rash    Tolerates Bactrim   Medications Prior to Admission  Medication Sig Dispense Refill  . albuterol (PROVENTIL HFA;VENTOLIN HFA) 108 (90 Base) MCG/ACT inhaler Inhale 2 puffs into the lungs every 4 (four) hours as needed for wheezing or shortness of breath.    Marland Kitchen atorvastatin (LIPITOR) 10 MG tablet Take 10 mg by mouth daily.    . budesonide (PULMICORT) 0.5 MG/2ML nebulizer solution Take 0.5 mg by nebulization 2 (two) times daily.    . budesonide-formoterol (SYMBICORT) 160-4.5 MCG/ACT inhaler Inhale 2 puffs into the lungs 2 (two) times daily.    . furosemide (LASIX) 20 MG tablet Take 20 mg by mouth daily.    . hydrOXYzine (VISTARIL) 25 MG capsule Take 25 mg by mouth every 6 (six) hours as needed for anxiety.     Marland Kitchen ipratropium (ATROVENT) 0.02 % nebulizer solution Take 0.5 mg by nebulization 3 (three) times daily.     Marland Kitchen ipratropium-albuterol (DUONEB) 0.5-2.5 (3) MG/3ML SOLN Take 3 mLs by nebulization every 4 (four) hours  as needed.     Marland Kitchen. LORazepam (ATIVAN) 0.5 MG tablet Take 0.5 mg by mouth every 8 (eight) hours as needed for anxiety.    . meloxicam (MOBIC) 15 MG tablet Take 15 mg by mouth daily.    . metoprolol succinate (TOPROL-XL) 50 MG 24 hr tablet Take 50 mg by mouth daily. Take with or immediately following a meal.    .  mirtazapine (REMERON) 30 MG tablet Take 45 mg by mouth at bedtime.    Marland Kitchen. morphine (MSIR) 15 MG tablet Take 15 mg by mouth 2 (two) times daily.    Marland Kitchen. omeprazole (PRILOSEC) 40 MG capsule Take 40 mg by mouth daily.    . risperiDONE (RISPERDAL) 2 MG tablet Take 2 mg by mouth at bedtime.     . sulfamethoxazole-trimethoprim (BACTRIM DS,SEPTRA DS) 800-160 MG tablet Take 1 tablet by mouth 2 (two) times daily.    Marland Kitchen. valproic acid (DEPAKENE) 250 MG capsule Take 500 mg by mouth 2 (two) times daily.     . cefdinir (OMNICEF) 300 MG capsule Take 300 mg by mouth 2 (two) times daily.      Home: Home Living Family/patient expects to be discharged to:: Private residence Living Arrangements: Spouse/significant other Available Help at Discharge: Family, Available 24 hours/day Type of Home: House Home Access: Stairs to enter Entergy CorporationEntrance Stairs-Number of Steps: 5 Entrance Stairs-Rails: Right Home Layout: One level Bathroom Shower/Tub: Walk-in shower Home Equipment: Environmental consultantWalker - 2 wheels, Bedside commode, Shower seat, Wheelchair - manual  Functional History: Prior Function Level of Independence: Needs assistance Gait / Transfers Assistance Needed: Modified independent with rolling walker for household distances. Uses w/c if out Functional Status:  Mobility: Bed Mobility Overal bed mobility: Needs Assistance Bed Mobility: Supine to Sit Supine to sit: Min assist General bed mobility comments: Pt up in chair Transfers Overall transfer level: Needs assistance Equipment used: Ambulation equipment used Transfer via Lift Equipment: Stedy Transfers: Sit to/from Stand, Pharmacologisttand Pivot Transfers Sit to Stand: Mod assist, +2 safety/equipment, Max assist Stand pivot transfers: Mod assist, +2 physical assistance General transfer comment: Heavy assist to bring hips up from low chair.  Used Stedy to pivot chair to bsc back to chair.  Ambulation/Gait General Gait Details: Pt too weak to amb    ADL:     Cognition: Cognition Overall Cognitive Status: Within Functional Limits for tasks assessed Orientation Level: Oriented X4 Cognition Arousal/Alertness: Awake/alert Behavior During Therapy: WFL for tasks assessed/performed Overall Cognitive Status: Within Functional Limits for tasks assessed  Blood pressure 110/63, pulse 65, temperature 97.7 F (36.5 C), temperature source Oral, resp. rate 15, height 5' (1.524 m), weight 79.7 kg (175 lb 11.3 oz), SpO2 90 %. Physical Exam  Vitals reviewed. Constitutional: She is oriented to person, place, and time.  HENT:  Head: Normocephalic.  Eyes: EOM are normal.  Neck: Normal range of motion. Neck supple. No thyromegaly present.  Cardiovascular: Normal rate and regular rhythm.  Exam reveals no friction rub.   No murmur heard. Respiratory:  Limited inspiratory effort with upper airway congestion. On oxygen via [place]. No shortness of breath with conversation  GI: Soft. Bowel sounds are normal. She exhibits no distension. There is no tenderness.  Musculoskeletal: She exhibits edema.  Neurological: She is alert and oriented to person, place, and time.  UE: 4-deltoid, bicep, tricep and 4/5 wrist and HI. LE: 2+ to 3-HF, 3 KE and 4-/5 ADF/PF. No obvious sensory deficits. Cognitively intact  Skin: Skin is warm and dry.  Psychiatric: She has a normal  mood and affect. Her behavior is normal.    Results for orders placed or performed during the hospital encounter of 08/26/16 (from the past 24 hour(s))  Glucose, capillary     Status: Abnormal   Collection Time: 09/06/16  4:52 PM  Result Value Ref Range   Glucose-Capillary 152 (H) 65 - 99 mg/dL  Glucose, capillary     Status: Abnormal   Collection Time: 09/06/16  8:31 PM  Result Value Ref Range   Glucose-Capillary 228 (H) 65 - 99 mg/dL   Comment 1 Notify RN   Glucose, capillary     Status: Abnormal   Collection Time: 09/06/16 10:36 PM  Result Value Ref Range   Glucose-Capillary 195 (H) 65 - 99 mg/dL    Comment 1 Notify RN   CBC     Status: Abnormal   Collection Time: 09/07/16  2:38 AM  Result Value Ref Range   WBC 9.4 4.0 - 10.5 K/uL   RBC 4.14 3.87 - 5.11 MIL/uL   Hemoglobin 10.8 (L) 12.0 - 15.0 g/dL   HCT 60.4 (L) 54.0 - 98.1 %   MCV 84.8 78.0 - 100.0 fL   MCH 26.1 26.0 - 34.0 pg   MCHC 30.8 30.0 - 36.0 g/dL   RDW 19.1 (H) 47.8 - 29.5 %   Platelets 210 150 - 400 K/uL  Basic metabolic panel     Status: Abnormal   Collection Time: 09/07/16  2:38 AM  Result Value Ref Range   Sodium 136 135 - 145 mmol/L   Potassium 4.3 3.5 - 5.1 mmol/L   Chloride 92 (L) 101 - 111 mmol/L   CO2 35 (H) 22 - 32 mmol/L   Glucose, Bld 152 (H) 65 - 99 mg/dL   BUN 15 6 - 20 mg/dL   Creatinine, Ser 6.21 (L) 0.44 - 1.00 mg/dL   Calcium 9.1 8.9 - 30.8 mg/dL   GFR calc non Af Amer >60 >60 mL/min   GFR calc Af Amer >60 >60 mL/min   Anion gap 9 5 - 15  Glucose, capillary     Status: Abnormal   Collection Time: 09/07/16  8:15 AM  Result Value Ref Range   Glucose-Capillary 107 (H) 65 - 99 mg/dL   No results found.  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 Does the need for close, 24 hr/day medical supervision in concert with the patient's rehab needs make it unreasonable for this patient to be served in a less intensive setting? Yes 2. Co-Morbidities requiring supervision/potential complications: COPD, chronic back pain, HTN 3. Due to bladder management, bowel management, safety, skin/wound care, disease management, medication administration, pain management and patient education, does the patient require 24 hr/day rehab nursing? Yes 4. Does the patient require coordinated care of a physician, rehab nurse, PT (1-2 hrs/day, 5 days/week) and OT (1-2 hrs/day, 5 days/week) to address physical and functional deficits in the context of the above medical diagnosis(es)? Yes Addressing deficits in the following areas: balance, endurance, locomotion, strength,  transferring, bowel/bladder control, bathing, dressing, feeding, grooming, toileting and psychosocial support 5. Can the patient actively participate in an intensive therapy program of at least 3 hrs of therapy per day at least 5 days per week? Yes 6. The potential for patient to make measurable gains while on inpatient rehab is excellent 7. Anticipated functional outcomes upon discharge from inpatient rehab are modified independent  with PT, modified independent and supervision with OT, n/a with SLP. 8. Estimated rehab length of stay  to reach the above functional goals is: 13-16 days 9. Does the patient have adequate social supports and living environment to accommodate these discharge functional goals? Yes 10. Anticipated D/C setting: Home 11. Anticipated post D/C treatments: HH therapy 12. Overall Rehab/Functional Prognosis: excellent  RECOMMENDATIONS: This patient's condition is appropriate for continued rehabilitative care in the following setting: CIR Patient has agreed to participate in recommended program. Yes Note that insurance prior authorization may be required for reimbursement for recommended care.  Comment: Mrs. Aundria RudRogers was independent within her home prior to arrival. She used a wheelchair only for longer distances in the community. Is very motivated and has a supportive husband. Rehab Admissions Coordinator to follow up.  Thanks,  Ranelle OysterZachary T. Adison Reifsteck, MD, [place] DomFAAPMR     09/07/2016

## 2016-09-08 ENCOUNTER — Encounter (HOSPITAL_COMMUNITY): Payer: Self-pay

## 2016-09-08 DIAGNOSIS — I10 Essential (primary) hypertension: Secondary | ICD-10-CM

## 2016-09-08 DIAGNOSIS — J441 Chronic obstructive pulmonary disease with (acute) exacerbation: Secondary | ICD-10-CM

## 2016-09-08 LAB — CBC
HCT: 36.2 % (ref 36.0–46.0)
HEMOGLOBIN: 11.1 g/dL — AB (ref 12.0–15.0)
MCH: 26.3 pg (ref 26.0–34.0)
MCHC: 30.7 g/dL (ref 30.0–36.0)
MCV: 85.8 fL (ref 78.0–100.0)
PLATELETS: 197 10*3/uL (ref 150–400)
RBC: 4.22 MIL/uL (ref 3.87–5.11)
RDW: 18.1 % — AB (ref 11.5–15.5)
WBC: 10.3 10*3/uL (ref 4.0–10.5)

## 2016-09-08 LAB — BASIC METABOLIC PANEL
Anion gap: 10 (ref 5–15)
BUN: 14 mg/dL (ref 6–20)
CALCIUM: 9.3 mg/dL (ref 8.9–10.3)
CHLORIDE: 93 mmol/L — AB (ref 101–111)
CO2: 33 mmol/L — ABNORMAL HIGH (ref 22–32)
CREATININE: 0.5 mg/dL (ref 0.44–1.00)
GFR calc non Af Amer: >60 mL/min (ref >60)
Glucose, Bld: 117 mg/dL — ABNORMAL HIGH (ref 65–99)
Potassium: 4.3 mmol/L (ref 3.5–5.1)
SODIUM: 136 mmol/L (ref 135–145)

## 2016-09-08 LAB — GLUCOSE, CAPILLARY
GLUCOSE-CAPILLARY: 88 mg/dL (ref 65–99)
GLUCOSE-CAPILLARY: 136 mg/dL — AB (ref 65–99)
GLUCOSE-CAPILLARY: 140 mg/dL — AB (ref 65–99)
Glucose-Capillary: 84 mg/dL (ref 65–99)
Glucose-Capillary: 106 mg/dL — ABNORMAL HIGH (ref 65–99)

## 2016-09-08 NOTE — Progress Notes (Signed)
Pt placed on BIPAP of 14/7 and a 4lt FIO2 bleed in. Pt tol well at this time.

## 2016-09-08 NOTE — Progress Notes (Signed)
Inpatient Rehabilitation  Met with patient to discuss team's recommendation for IP Rehab.  Shared booklets and answered questions.  Patient eager to get stronger before going back home.  Have initiated insurance authorization.  Plan to follow along for timing of medical readiness, insurance authorization, and bed availability.  Please call with questions.   Carmelia Roller., CCC/SLP Admission Coordinator  [place]  Cell [phone]

## 2016-09-08 NOTE — Care Management Note (Signed)
Case Management Note  Patient Details  Name: Cheryl Wheeler MRN: [id number] Date of Birth: Jul 14, 1966  Subjective/Objective:   Pt lives with spouse who will provide 24/7 assistance when she is discharged.  Is hopeful for transfer to inpatient rehab, admissions coordinator has submitted for insurance authorization.                             Expected Discharge Plan:  IP Rehab Facility  Discharge planning Services  CM Consult  Status of Service:  In process, will continue to follow  Magdalene RiverMayo, Jenella Craigie T, RN 09/08/2016, 12:52 PM

## 2016-09-08 NOTE — Care Management Important Message (Signed)
Important Message  Patient Details  Name: Cheryl Wheeler MRN: [id number] Date of Birth: [birth year]   Medicare Important Message Given:  Yes    [name], [name] 09/08/2016, 10:48 AM

## 2016-09-08 NOTE — Progress Notes (Signed)
PROGRESS NOTE [facility]   Cheryl Wheeler   ZOX:[id number] DOB: 04-12-1966  DOA: 08/26/2016 PCP: Noni SaupeEDDING II,JOHN F., MD   Brief Narrative:  Cheryl CocoJonce Ann Rogersis a 50 year old female with ?end-stage COPD on 3 L oxygen and BiPAP at home, chronic granulomatous disease, hypertension, bipolar disorder, anxiety and GERD who [facility] after needing continuous positive pressure ventilation with BiPAP for 3 days. On 08/22/2016, [facility] with dyspnea, dry cough and subjective fever and hypoxia to 70's. [facility] 7[facility]. CBC significant for mild leukocytosis. BMP remarkable for hypoosmolar hyponatremia to 128 otherwise within normal limits. UA was remarkable for pyuria but urine culture was negative. She was started on Solu-Medrol, DuoNeb, Pulmicort and guanfacine-DM for COPD exacerbation. However, she continued to have worsening dyspnea with increased oxygen requirement on BiPAP continuously. She was also started on cefepime and vancomycin on 08/24/2016. Given lack of improvement, [facility]. On 11/17, patient was intubated, she also underwent a CT angiogram of the chest which was negative for PE. She had a bronchoscopy on 11/18. Only positive micro was positive RSV on a respiratory virus panel. She eventually was able to be liberated from the ventilator and was extubated on 11/24, and [facility] assumed care on 11/26. Pulmonary continues to round on the patient given complex pulmonary history.  SIGNIFICANT EVENTS: 11/12 -[facility] where his acute on chronic COPD exacerbation and hypoosmolar hyponatremia 11/14 -Started on vancomycin and cefepime 11/16 -[facility] for [facility] due to continuous need for BiPAP and increased oxygen requirement.  11/17 -Intubated. CT chest negative for PE.  11/18 - Bronch RML, no  DAH, some erthyema blood, BAL sent, tolerated 11/19 - versed drip required 11/24 - extubated 11/26 - [facility] primary  Subjective: Patient seen and examined with nurse at bedside. Patient feeling much better, O2 requirement decreasing, pending for inpatient rehab for disposition. Denies chest pain, palpitation and dizziness.   Assessment & Plan: Acute on chronic hypoxic and hypercapnic respiratory failuremultifactorial due to advanced COPD, OSA, OHS Progressedto vent-depending respiratory failure during this hospitalization, now extubated O2 4 L nasal cannula. Patient oxygen dependent normally at 2 L at home. Wean O2 as tolerated to keep saturation above 89%.  Acute pulmonary edema 2/2 acute on chronic diastolic CHF This has resolved with IV lasix. Continue Lasix 40mg  PO daily   Chronic granulomatous disease On Bactrim prophylaxis and follows with immunologist as outpatient   DM type 2  Lantus 14 U, Novolog 5U scheduled plus SSI. Appreciate diabetic education.   HTN On labetalol, blood pressure well controlled  CAP CT of the chest with concern for pneumonia. RSV Bpositive. Finished a course of antibiotics.   Bipolar / Anxiety Continue home medications  Chronic back pain Continue home medication  Deconditioning Due to acute illness. PT/OT evaluation   DVT prophylaxis: Lovenox  Code Status: Full Family Communication: None at bedside Disposition Plan: Inpatient rehab vs SNF in 24-48 hrs   Consultants:   Physical Rehab   PCCM   Procedures:  2D echo Study Conclusions - Left ventricle: The cavity size was normal. There was moderateconcentric hypertrophy. Systolic function was vigorous. Theestimated ejection fraction was in the range of 65% to 70%. Wallmotion was normal; there were no regional wall motionabnormalities. Doppler parameters are consistent with abnormalleft ventricular relaxation (grade 1 diastolic dysfunction). - Aortic valve: Valve area  (VTI): 1.45 cm^2. Valve area (Vmax):1.27 cm^2. Valve area (  Vmean): 1.29 cm^2. - Mitral valve: Calcified annulus. - Left atrium: The atrium was mildly dilated. - Right ventricle: The cavity size was normal. Wall thickness wasmildly increased. - Pericardium, extracardiac: A trivial pericardial effusion wasidentified posterior to the heart.  Antimicrobials:  Cefepime 11/14-11/16  Vancomycin 11/14-11/16  Rocephin 11/21-11/24  Bactrim for chronic ppx    Objective: Vitals:   09/08/16 0400 09/08/16 0809 09/08/16 0819 09/08/16 0821  BP: (!) 104/53 (!) 108/53    Pulse: 69 65    Resp: 17 19    Temp: 98.3 F (36.8 C) 98 F (36.7 C)    TempSrc: Axillary Oral    SpO2: 91% 92% 97% 97%  Weight: 80.7 kg (177 lb 14.6 oz) 83.4 kg (183 lb 13.8 oz)    Height:        Intake/Output Summary (Last 24 hours) at 09/08/16 0843 Last data filed at 09/08/16 0809  Gross per 24 hour  Intake              480 ml  Output              200 ml  Net              280 ml   Filed Weights   09/07/16 0500 09/08/16 0400 09/08/16 0809  Weight: 79.7 kg (175 lb 11.3 oz) 80.7 kg (177 lb 14.6 oz) 83.4 kg (183 lb 13.8 oz)    Examination:  General exam: Appears calm and comfortable  Respiratory system: Good air entry, crackles at R base, [place] on place  Cardiovascular system: S1 & S2 heard, RRR. No JVD, murmurs, rubs or gallops Gastrointestinal system: Abdomen is nondistended, soft and nontender. Normal bowel sounds heard. Central nervous system: Alert and oriented.  Extremities: No pedal edema. Skin: No rashes, lesions or ulcers  Data Reviewed: I have personally reviewed following labs and imaging studies  CBC:  Recent Labs Lab 09/04/16 0309 09/05/16 0312 09/06/16 0212 09/07/16 0238 09/08/16 0245  WBC 12.3* 12.9* 11.0* 9.4 10.3  HGB 11.0* 11.2* 10.9* 10.8* 11.1*  HCT 35.5* 36.6 35.5* 35.1* 36.2  MCV 83.7 83.8 84.5 84.8 85.8  PLT 242 236 207 210 197   Basic Metabolic Panel:  Recent Labs Lab  09/02/16 0242  09/03/16 0214 09/04/16 0309 09/05/16 0312 09/06/16 0212 09/07/16 0238 09/08/16 0245  NA 142  < > 137 135 136 135 136 136  K 3.5  < > 4.4 4.4 3.5 3.4* 4.3 4.3  CL 93*  < > 91* 91* 88* 89* 92* 93*  CO2 40*  < > 36* 35* 37* 34* 35* 33*  GLUCOSE 92  < > 165* 107* 110* 162* 152* 117*  BUN 27*  < > 26* 22* 19 17 15 14   CREATININE 0.36*  < > 0.34* 0.33* 0.42* 0.42* 0.39* 0.50  CALCIUM 9.5  < > 9.4 9.6 9.5 9.2 9.1 9.3  MG 2.0  --  2.2 2.3 2.1 2.0  --   --   PHOS 4.2  --  4.3 4.4 3.9 3.3  --   --   < > = values in this interval not displayed. GFR: Estimated Creatinine Clearance: 79.7 mL/min (by C-G formula based on SCr of 0.5 mg/dL). Liver Function Tests:  Recent Labs Lab 09/06/16 0212  AST 15  ALT 37  ALKPHOS 34*  BILITOT 0.3  PROT 6.0*  ALBUMIN 3.4*   No results for input(s): LIPASE, AMYLASE in the last 168 hours. No results for input(s): AMMONIA in the last 168 hours.  Coagulation Profile: No results for input(s): INR, PROTIME in the last 168 hours. Cardiac Enzymes: No results for input(s): CKTOTAL, CKMB, CKMBINDEX, TROPONINI in the last 168 hours. BNP (last 3 results) No results for input(s): PROBNP in the last 8760 hours. HbA1C: No results for input(s): HGBA1C in the last 72 hours. CBG:  Recent Labs Lab 09/07/16 0815 09/07/16 1200 09/07/16 1406 09/07/16 2126 09/08/16 0759  GLUCAP 107* 94 160* 173* 88   Lipid Profile: No results for input(s): CHOL, HDL, LDLCALC, TRIG, CHOLHDL, LDLDIRECT in the last 72 hours. Thyroid Function Tests: No results for input(s): TSH, T4TOTAL, FREET4, T3FREE, THYROIDAB in the last 72 hours. Anemia Panel: No results for input(s): VITAMINB12, FOLATE, FERRITIN, TIBC, IRON, RETICCTPCT in the last 72 hours. Sepsis Labs: No results for input(s): PROCALCITON, LATICACIDVEN in the last 168 hours.  Recent Results (from the past 240 hour(s))  Culture, Urine     Status: Abnormal   Collection Time: 09/01/16  8:51 AM  Result  Value Ref Range Status   Specimen Description URINE, CATHETERIZED  Final   Special Requests NONE  Final   Culture 10,000 COLONIES/mL YEAST (A)  Final   Report Status 09/02/2016 FINAL  Final     Radiology Studies: No results found.    Scheduled Meds: . atorvastatin  10 mg Oral Daily  . budesonide (PULMICORT) nebulizer solution  0.5 mg Nebulization BID  . chlorhexidine  15 mL Mouth Rinse BID  . furosemide  40 mg Oral Daily  . insulin aspart  0-20 Units Subcutaneous TID WC  . insulin aspart  0-5 Units Subcutaneous QHS  . insulin aspart  5 Units Subcutaneous TID WC  . insulin glargine  14 Units Subcutaneous Q24H  . ipratropium-albuterol  3 mL Nebulization QID  . labetalol  300 mg Oral TID  . mouth rinse  15 mL Mouth Rinse q12n4p  . mirtazapine  30 mg Oral QHS  . morphine  15 mg Oral q12n4p  . predniSONE  10 mg Oral 4X daily taper  . risperiDONE  2 mg Oral BID  . sulfamethoxazole-trimethoprim  1 tablet Oral BID  . Valproate Sodium  500 mg Oral BID   Continuous Infusions:   LOS: 13 days    Latrelle Dodrill, MD [facility] Pager [phone]  If 7PM-7AM, please contact night-coverage [email or web address] Password [facility] 09/08/2016, 8:43 AM

## 2016-09-09 ENCOUNTER — Inpatient Hospital Stay (HOSPITAL_COMMUNITY): Payer: Medicare HMO

## 2016-09-09 ENCOUNTER — Encounter (HOSPITAL_COMMUNITY): Payer: Self-pay | Admitting: *Deleted

## 2016-09-09 ENCOUNTER — Inpatient Hospital Stay (HOSPITAL_COMMUNITY)
Admission: RE | Admit: 2016-09-09 | Discharge: 2016-09-17 | DRG: 091 | Disposition: A | Discharge status: Other Acute Inpatient Hospital | Payer: Medicare HMO | Source: Intra-hospital | Attending: Physical Medicine & Rehabilitation | Admitting: Physical Medicine & Rehabilitation

## 2016-09-09 DIAGNOSIS — E1142 Type 2 diabetes mellitus with diabetic polyneuropathy: Secondary | ICD-10-CM

## 2016-09-09 DIAGNOSIS — I110 Hypertensive heart disease with heart failure: Secondary | ICD-10-CM | POA: Diagnosis present

## 2016-09-09 DIAGNOSIS — F319 Bipolar disorder, unspecified: Secondary | ICD-10-CM

## 2016-09-09 DIAGNOSIS — Y95 Nosocomial condition: Secondary | ICD-10-CM | POA: Diagnosis present

## 2016-09-09 DIAGNOSIS — J9690 Respiratory failure, unspecified, unspecified whether with hypoxia or hypercapnia: Secondary | ICD-10-CM

## 2016-09-09 DIAGNOSIS — Z885 Allergy status to narcotic agent status: Secondary | ICD-10-CM

## 2016-09-09 DIAGNOSIS — D62 Acute posthemorrhagic anemia: Secondary | ICD-10-CM

## 2016-09-09 DIAGNOSIS — J449 Chronic obstructive pulmonary disease, unspecified: Secondary | ICD-10-CM

## 2016-09-09 DIAGNOSIS — Z882 Allergy status to sulfonamides status: Secondary | ICD-10-CM | POA: Diagnosis not present

## 2016-09-09 DIAGNOSIS — Z9981 Dependence on supplemental oxygen: Secondary | ICD-10-CM

## 2016-09-09 DIAGNOSIS — Z888 Allergy status to other drugs, medicaments and biological substances status: Secondary | ICD-10-CM | POA: Diagnosis not present

## 2016-09-09 DIAGNOSIS — J9610 Chronic respiratory failure, unspecified whether with hypoxia or hypercapnia: Secondary | ICD-10-CM | POA: Diagnosis present

## 2016-09-09 DIAGNOSIS — D71 Functional disorders of polymorphonuclear neutrophils: Secondary | ICD-10-CM

## 2016-09-09 DIAGNOSIS — K219 Gastro-esophageal reflux disease without esophagitis: Secondary | ICD-10-CM | POA: Diagnosis present

## 2016-09-09 DIAGNOSIS — I5033 Acute on chronic diastolic (congestive) heart failure: Secondary | ICD-10-CM

## 2016-09-09 DIAGNOSIS — Z880 Allergy status to penicillin: Secondary | ICD-10-CM

## 2016-09-09 DIAGNOSIS — R0602 Shortness of breath: Secondary | ICD-10-CM

## 2016-09-09 DIAGNOSIS — Z91048 Other nonmedicinal substance allergy status: Secondary | ICD-10-CM | POA: Diagnosis not present

## 2016-09-09 DIAGNOSIS — E785 Hyperlipidemia, unspecified: Secondary | ICD-10-CM | POA: Diagnosis present

## 2016-09-09 DIAGNOSIS — I10 Essential (primary) hypertension: Secondary | ICD-10-CM | POA: Diagnosis not present

## 2016-09-09 DIAGNOSIS — Z881 Allergy status to other antibiotic agents status: Secondary | ICD-10-CM

## 2016-09-09 DIAGNOSIS — M549 Dorsalgia, unspecified: Secondary | ICD-10-CM | POA: Diagnosis present

## 2016-09-09 DIAGNOSIS — J189 Pneumonia, unspecified organism: Secondary | ICD-10-CM | POA: Diagnosis not present

## 2016-09-09 DIAGNOSIS — G7281 Critical illness myopathy: Secondary | ICD-10-CM | POA: Diagnosis present

## 2016-09-09 DIAGNOSIS — J441 Chronic obstructive pulmonary disease with (acute) exacerbation: Secondary | ICD-10-CM | POA: Diagnosis not present

## 2016-09-09 DIAGNOSIS — R071 Chest pain on breathing: Secondary | ICD-10-CM

## 2016-09-09 DIAGNOSIS — Z79899 Other long term (current) drug therapy: Secondary | ICD-10-CM

## 2016-09-09 DIAGNOSIS — F419 Anxiety disorder, unspecified: Secondary | ICD-10-CM | POA: Diagnosis present

## 2016-09-09 DIAGNOSIS — Z7951 Long term (current) use of inhaled steroids: Secondary | ICD-10-CM

## 2016-09-09 DIAGNOSIS — R918 Other nonspecific abnormal finding of lung field: Secondary | ICD-10-CM

## 2016-09-09 DIAGNOSIS — J440 Chronic obstructive pulmonary disease with acute lower respiratory infection: Secondary | ICD-10-CM | POA: Diagnosis not present

## 2016-09-09 DIAGNOSIS — R531 Weakness: Secondary | ICD-10-CM | POA: Diagnosis present

## 2016-09-09 DIAGNOSIS — J9621 Acute and chronic respiratory failure with hypoxia: Secondary | ICD-10-CM | POA: Diagnosis not present

## 2016-09-09 DIAGNOSIS — Z791 Long term (current) use of non-steroidal anti-inflammatories (NSAID): Secondary | ICD-10-CM | POA: Diagnosis not present

## 2016-09-09 DIAGNOSIS — Z9989 Dependence on other enabling machines and devices: Secondary | ICD-10-CM

## 2016-09-09 DIAGNOSIS — F3170 Bipolar disorder, currently in remission, most recent episode unspecified: Secondary | ICD-10-CM | POA: Diagnosis not present

## 2016-09-09 DIAGNOSIS — E8809 Other disorders of plasma-protein metabolism, not elsewhere classified: Secondary | ICD-10-CM

## 2016-09-09 DIAGNOSIS — G8929 Other chronic pain: Secondary | ICD-10-CM | POA: Diagnosis present

## 2016-09-09 DIAGNOSIS — G4733 Obstructive sleep apnea (adult) (pediatric): Secondary | ICD-10-CM

## 2016-09-09 DIAGNOSIS — R32 Unspecified urinary incontinence: Secondary | ICD-10-CM

## 2016-09-09 DIAGNOSIS — Z794 Long term (current) use of insulin: Secondary | ICD-10-CM

## 2016-09-09 DIAGNOSIS — E46 Unspecified protein-calorie malnutrition: Secondary | ICD-10-CM

## 2016-09-09 LAB — GLUCOSE, CAPILLARY
GLUCOSE-CAPILLARY: 96 mg/dL (ref 65–99)
Glucose-Capillary: 102 mg/dL — ABNORMAL HIGH (ref 65–99)
Glucose-Capillary: 138 mg/dL — ABNORMAL HIGH (ref 65–99)
Glucose-Capillary: 84 mg/dL (ref 65–99)
Glucose-Capillary: 116 mg/dL — ABNORMAL HIGH (ref 65–99)

## 2016-09-09 LAB — BASIC METABOLIC PANEL
Anion gap: 11 (ref 5–15)
BUN: 13 mg/dL (ref 6–20)
CALCIUM: 9.1 mg/dL (ref 8.9–10.3)
CO2: 34 mmol/L — AB (ref 22–32)
CREATININE: 0.35 mg/dL — AB (ref 0.44–1.00)
Chloride: 92 mmol/L — ABNORMAL LOW (ref 101–111)
GFR calc Af Amer: >60 mL/min (ref >60)
GFR calc non Af Amer: >60 mL/min (ref >60)
GLUCOSE: 137 mg/dL — AB (ref 65–99)
Potassium: 4 mmol/L (ref 3.5–5.1)
Sodium: 137 mmol/L (ref 135–145)

## 2016-09-09 LAB — BLOOD GAS, ARTERIAL
Acid-Base Excess: 15 mmol/L — ABNORMAL HIGH (ref 0.0–2.0)
BICARBONATE: 40.1 mmol/L — AB (ref 20.0–28.0)
DRAWN BY: 313941
O2 CONTENT: 4.5 L/min
O2 Saturation: 90.5 %
PATIENT TEMPERATURE: 98.1
pCO2 arterial: 58 mmHg — ABNORMAL HIGH (ref 32.0–48.0)
pH, Arterial: 7.452 — ABNORMAL HIGH (ref 7.350–7.450)
pO2, Arterial: 60.3 mmHg — ABNORMAL LOW (ref 83.0–108.0)

## 2016-09-09 LAB — CBC
HEMATOCRIT: 35 % — AB (ref 36.0–46.0)
Hemoglobin: 10.5 g/dL — ABNORMAL LOW (ref 12.0–15.0)
MCH: 25.9 pg — ABNORMAL LOW (ref 26.0–34.0)
MCHC: 30 g/dL (ref 30.0–36.0)
MCV: 86.2 fL (ref 78.0–100.0)
Platelets: 181 10*3/uL (ref 150–400)
RBC: 4.06 MIL/uL (ref 3.87–5.11)
RDW: 18.4 % — AB (ref 11.5–15.5)
WBC: 8.8 10*3/uL (ref 4.0–10.5)

## 2016-09-09 MED ORDER — ACETAMINOPHEN 325 MG PO TABS
650.0000 mg | ORAL_TABLET | Freq: Four times a day (QID) | ORAL | Status: DC | PRN
  Administered 2016-09-10 – 2016-09-17 (×20): 650 mg via ORAL
  Filled 2016-09-09 (×20): qty 2

## 2016-09-09 MED ORDER — ONDANSETRON HCL 4 MG/2ML IJ SOLN: 4.0000 mg | Freq: Four times a day (QID) | INTRAMUSCULAR | Status: DC | PRN

## 2016-09-09 MED ORDER — BUDESONIDE 0.5 MG/2ML IN SUSP
0.5000 mg | Freq: Two times a day (BID) | RESPIRATORY_TRACT | Status: DC
  Administered 2016-09-09 – 2016-09-17 (×16): 0.5 mg via RESPIRATORY_TRACT
  Filled 2016-09-09 (×16): qty 2

## 2016-09-09 MED ORDER — FUROSEMIDE 10 MG/ML IJ SOLN
40.0000 mg | Freq: Once | INTRAMUSCULAR | Status: AC
  Administered 2016-09-09: 40 mg via INTRAVENOUS
  Filled 2016-09-09: qty 4

## 2016-09-09 MED ORDER — ACETAMINOPHEN 325 MG PO TABS: 650.0000 mg | ORAL_TABLET | Freq: Four times a day (QID) | ORAL | Status: AC | PRN

## 2016-09-09 MED ORDER — FUROSEMIDE 40 MG PO TABS
40.0000 mg | ORAL_TABLET | Freq: Every day | ORAL | Status: DC
  Administered 2016-09-10 – 2016-09-17 (×8): 40 mg via ORAL
  Filled 2016-09-09 (×8): qty 1

## 2016-09-09 MED ORDER — ATORVASTATIN CALCIUM 10 MG PO TABS
10.0000 mg | ORAL_TABLET | Freq: Every day | ORAL | Status: DC
  Administered 2016-09-10 – 2016-09-17 (×8): 10 mg via ORAL
  Filled 2016-09-09 (×8): qty 1

## 2016-09-09 MED ORDER — ONDANSETRON HCL 4 MG PO TABS
4.0000 mg | ORAL_TABLET | Freq: Four times a day (QID) | ORAL | Status: DC | PRN
  Administered 2016-09-15: 4 mg via ORAL
  Filled 2016-09-09: qty 1

## 2016-09-09 MED ORDER — PREDNISONE 10 MG (21) PO TBPK
10.0000 mg | ORAL_TABLET | Freq: Four times a day (QID) | ORAL | Status: DC
Start: 2016-09-09 — End: 2016-09-28

## 2016-09-09 MED ORDER — GUAIFENESIN-DM 100-10 MG/5ML PO SYRP: 5.0000 mL | ORAL_SOLUTION | ORAL | Status: DC | PRN

## 2016-09-09 MED ORDER — VALPROATE SODIUM 250 MG/5ML PO SOLN
500.0000 mg | Freq: Two times a day (BID) | ORAL | Status: DC
  Administered 2016-09-09 – 2016-09-17 (×16): 500 mg via ORAL
  Filled 2016-09-09 (×16): qty 10

## 2016-09-09 MED ORDER — SORBITOL 70 % SOLN
30.0000 mL | Freq: Every day | Status: DC | PRN
Start: 2016-09-09 — End: 2016-09-17

## 2016-09-09 MED ORDER — MIRTAZAPINE 30 MG PO TBDP
30.0000 mg | ORAL_TABLET | Freq: Every day | ORAL | Status: DC
  Administered 2016-09-09 – 2016-09-16 (×8): 30 mg via ORAL
  Filled 2016-09-09 (×8): qty 1

## 2016-09-09 MED ORDER — LABETALOL HCL 300 MG PO TABS
300.0000 mg | ORAL_TABLET | Freq: Three times a day (TID) | ORAL | Status: DC
  Administered 2016-09-09 – 2016-09-17 (×22): 300 mg via ORAL
  Filled 2016-09-09 (×24): qty 1

## 2016-09-09 MED ORDER — INSULIN GLARGINE 100 UNIT/ML ~~LOC~~ SOLN
14.0000 [IU] | SUBCUTANEOUS | Status: DC
  Administered 2016-09-10 – 2016-09-16 (×8): 14 [IU] via SUBCUTANEOUS
  Filled 2016-09-09 (×9): qty 0.14

## 2016-09-09 MED ORDER — INSULIN ASPART 100 UNIT/ML ~~LOC~~ SOLN
5.0000 [IU] | Freq: Three times a day (TID) | SUBCUTANEOUS | Status: DC
  Administered 2016-09-10: 5 [IU] via SUBCUTANEOUS

## 2016-09-09 MED ORDER — ALBUTEROL SULFATE (2.5 MG/3ML) 0.083% IN NEBU
2.5000 mg | INHALATION_SOLUTION | RESPIRATORY_TRACT | Status: DC | PRN
  Administered 2016-09-11 – 2016-09-17 (×6): 2.5 mg via RESPIRATORY_TRACT
  Filled 2016-09-09 (×6): qty 3

## 2016-09-09 MED ORDER — VALPROIC ACID 250 MG PO CAPS: 500.0000 mg | ORAL_CAPSULE | Freq: Two times a day (BID) | ORAL | Status: DC

## 2016-09-09 MED ORDER — SULFAMETHOXAZOLE-TRIMETHOPRIM 800-160 MG PO TABS
1.0000 | ORAL_TABLET | Freq: Two times a day (BID) | ORAL | Status: DC
  Administered 2016-09-09 – 2016-09-17 (×16): 1 via ORAL
  Filled 2016-09-09 (×16): qty 1

## 2016-09-09 MED ORDER — PREDNISONE 10 MG (21) PO TBPK
10.0000 mg | ORAL_TABLET | Freq: Four times a day (QID) | ORAL | Status: AC
  Administered 2016-09-10 (×2): 10 mg via ORAL
  Filled 2016-09-09: qty 21

## 2016-09-09 MED ORDER — MORPHINE SULFATE 15 MG PO TABS
15.0000 mg | ORAL_TABLET | Freq: Two times a day (BID) | ORAL | Status: DC
  Administered 2016-09-09 – 2016-09-13 (×9): 15 mg via ORAL
  Filled 2016-09-09 (×10): qty 1

## 2016-09-09 MED ORDER — RISPERIDONE 1 MG PO TABS
2.0000 mg | ORAL_TABLET | Freq: Two times a day (BID) | ORAL | Status: DC
  Administered 2016-09-09 – 2016-09-13 (×9): 2 mg via ORAL
  Filled 2016-09-09 (×9): qty 2

## 2016-09-09 MED ORDER — INSULIN ASPART 100 UNIT/ML ~~LOC~~ SOLN
0.0000 [IU] | Freq: Three times a day (TID) | SUBCUTANEOUS | Status: DC
  Administered 2016-09-10: 4 [IU] via SUBCUTANEOUS
  Administered 2016-09-11 – 2016-09-12 (×2): 3 [IU] via SUBCUTANEOUS
  Administered 2016-09-13: 4 [IU] via SUBCUTANEOUS
  Administered 2016-09-13 – 2016-09-14 (×2): 3 [IU] via SUBCUTANEOUS
  Administered 2016-09-15: 7 [IU] via SUBCUTANEOUS

## 2016-09-09 MED ORDER — LABETALOL HCL 300 MG PO TABS: 300.0000 mg | ORAL_TABLET | Freq: Three times a day (TID) | ORAL | Status: DC

## 2016-09-09 MED ORDER — IPRATROPIUM-ALBUTEROL 0.5-2.5 (3) MG/3ML IN SOLN
3.0000 mL | Freq: Four times a day (QID) | RESPIRATORY_TRACT | Status: DC
  Administered 2016-09-09 – 2016-09-17 (×29): 3 mL via RESPIRATORY_TRACT
  Filled 2016-09-09 (×30): qty 3

## 2016-09-09 MED ORDER — INSULIN GLARGINE 100 UNIT/ML ~~LOC~~ SOLN: 14.0000 [IU] | SUBCUTANEOUS | 11 refills | Status: DC

## 2016-09-09 MED ORDER — GUAIFENESIN-DM 100-10 MG/5ML PO SYRP
5.0000 mL | ORAL_SOLUTION | ORAL | 0 refills | Status: DC | PRN
Start: 2016-09-09 — End: 2016-10-15

## 2016-09-09 NOTE — IPOC Note (Signed)
Overall Plan of Care [facility] Cheryl Wheeler(IPOC) Patient Details Name: Cheryl Wheeler MRN: [id number] DOB: 17-Sep-1966  Admitting Diagnosis: Debility Resp Failure  Hospital Problems: Active Problems:   Critical illness myopathy   Bipolar affective disorder (HCC)   Essential hypertension   Acute on chronic diastolic heart failure (HCC)   Urinary incontinence   Acute blood loss anemia   Hypoalbuminemia due to protein-calorie malnutrition [facility](HCC)     Functional Problem List: Nursing Bladder, Bowel, Endurance, Medication Management, Pain, Safety, Skin Integrity, Nutrition  PT Balance, Endurance, Motor, Pain  OT Balance, Safety, Endurance, Motor  SLP    TR         Basic ADL's: OT Grooming, Bathing, Dressing, Toileting     Advanced  ADL's: OT       Transfers: PT Bed Mobility, Bed to Chair, Car, Occupational psychologisturniture  OT Toilet, Research scientist (life sciences)Tub/Shower     Locomotion: PT Ambulation, Psychologist, prison and probation servicesWheelchair Mobility, Stairs     Additional Impairments: OT Fuctional Use of Upper Extremity  SLP        TR      Anticipated Outcomes Item Anticipated Outcome  Self Feeding N/A  Swallowing      Basic self-care  Min A-Mod I   Toileting  Min A   Bathroom Transfers Supervision   Bowel/Bladder  Min assist  Transfers  supervision  Locomotion  supervision  Communication     Cognition     Pain  <3 on a 0-10 pain scale  Safety/Judgment  mod assist   Therapy Plan: PT Intensity: Minimum of 1-2 x/day ,45 to 90 minutes PT Frequency: 5 out of 7 days PT Duration Estimated Length of Stay: 2.5-3 weeks OT Intensity: Minimum of 1-2 x/day, 45 to 90 minutes OT Frequency: 5 out of 7 days OT Duration/Estimated Length of Stay: 3 weeks         Team Interventions: Nursing Interventions Patient/Family Education, Bladder Management, Bowel Management, Disease Management/Prevention, Pain Management, Medication Management, Skin Care/Wound Management, Discharge Planning, Psychosocial Support  PT interventions Ambulation/gait training,  Balance/vestibular training, Discharge planning, Disease management/prevention, DME/adaptive equipment instruction, Functional mobility training, Neuromuscular re-education, Pain management, Patient/family education, Psychosocial support, Splinting/orthotics, Stair training, Therapeutic Activities, Therapeutic Exercise, UE/LE Strength taining/ROM, Wheelchair propulsion/positioning  OT Interventions Discharge planning, Self Care/advanced ADL retraining, Therapeutic Activities, UE/LE Coordination activities, Functional mobility training, Therapeutic Exercise, Psychosocial support, UE/LE Strength taining/ROM  SLP Interventions    TR Interventions    SW/CM Interventions Discharge Planning, Psychosocial Support, Patient/Family Education    Team Discharge Planning: Destination: PT-Home ,OT- Home , SLP-  Projected Follow-up: PT-Home health PT, 24 hour supervision/assistance, OT-  Home health OT, SLP-  Projected Equipment Needs: PT-None recommended by PT, OT- To be determined, SLP-  Equipment Details: PT-has RW and w/c at home-using PTA, OT-  Patient/family involved in discharge planning: PT- Patient,  OT-Patient, Family member/caregiver, SLP-   MD ELOS: 18-21 days. Medical Rehab Prognosis:  Good  Assessment: 50 y.o.right handed femalewith history of 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 Per chart review patient lives with spouse. One level homewith 5 steps to entry. Modified independent with rolling walker for household distances prior to admission. Uses a wheelchair outside of the home. [facility] for acute on chronic respiratory failure with low-grade fever and hypoxic in the 70s and  required  intubation. Findings of mild hyponatremia 128. Started on Solu-Medrol and nebulizer treatments. [facility]26[facility]2016 for ongoing medical care. Chest x-ray was negative for acute abnormality. CT angiogram of the chest negative for pulmonary embolibut concerning for pneumonia. RSV-B positive. Completed a course of broad-spectrum antibiotics.Pulmonary care service follow-up remained intubated through 09/03/2016.Prednisone Dosepak taper as directed. Pt with resulting functional deficits with weakness, transfers, and activity tolerance.  Will set goals for Supervision for mobility and Min A with toileting with therapies.   See Team Conference Notes for weekly updates to the plan of care

## 2016-09-09 NOTE — Progress Notes (Signed)
Inpatient Rehabilitation  I have medical clearance to admit and will proceed with IP Rehab admission today.  Please call with questions.  Charlane FerrettiMelissa Eiliyah Reh, M.A., CCC/SLP Admission Coordinator  [facility]  Cell [phone]

## 2016-09-09 NOTE — Progress Notes (Signed)
Fae PippinMelissa Danaja [place] [facility] Admission Coordinator Signed Physical Medicine and Rehabilitation  PMR Pre-admission Date of Service: 09/09/2016 8:51 AM  Related encounter: Admission (Current) from 08/26/[facility]E CV SURGICAL PROGRESSIVE CARE       [] Hide copied text PMR Admission Coordinator Pre-Admission Assessment  Patient: Cheryl Wheeler is an 50 y.o., female MRN: [id number] DOB: 27-Jul-1966 Height: 5' (152.4 cm) Weight: 81.8 kg (180 lb 5.4 oz)                                                                                                                                                  Insurance Information HMO:     PPO: X     PCP:      IPA:      80/20:      OTHER:  PRIMARY: AET[employer] Medicare       Policy#: [id number]      Subscriber: Self CM Name: Regino BellowLotoya Anthony      Phone#: [phone]     Fax#: [phone] Pre-Cert#: [id number] for 5 days 09/09/16-09/13/16 with faxed updates due 09/12/16    Employer: Disabled  Benefits:  Phone #: [phone]     Name: reference # [id number] Eff. Date: 10/12/15     Deduct: $0      Out of Pocket Max: $4,950      Life Max: N/A CIR: $295 a day; max $1,770 per admission      SNF: $0 a day, days 1-20; $164 a day, days 21-100 Outpatient: PT/OT     Co-Pay: $40 a visit  Home Health: PT/OT       Co-Pay: none DME: 80%     Co-Pay: 20% Providers: in network   Medicaid Application Date:       Case Manager:  Disability Application Date:       Case Worker:   Emergency ActuaryContact Information        Contact Information    Name Relation Home Work Mobile   Wever,[name] Spouse [phone]       Current Medical History  Patient Admitting Diagnosis: Functional and mobility deficits due to critical illness myopathy  History of Present Illness: Cheryl CocoJonce Ann Rogersis a 50 y.o.right handed femalewith history of advanced COPD on 2-4L of oxygen as well as BiPAPat home, chronic granulomatous disease and maintained on Bactrim for prophylaxis followed by  immunologist as outpatient, chronic diastolic congestive heart failure, diabetes mellitus, hypertension, bipolar disorder, chronic back pain maintained on MSIR 15 mg twice a day. Per chart review patient lives with spouse. One level homewith 5 steps to entry. Modified independent with rolling walker for household distances prior to admission. Uses a wheelchair outside of the home. [facility]2016 for acute on chronic respiratory failure with low-grade fever and hypoxic in the 70s and required intubation. Findings of mild hyponatremia 128. Started on Solu-Medrol and nebulizer treatments.  [facility] for ongoing medical care. Chest x-ray was negative for acute abnormality. CT angiogram of the chest negative for pulmonary embolibut concerning for pneumonia. RSV-B positive. Completed a course of broad-spectrum antibiotics.Pulmonary care service follow-up remain intubated through 09/03/2016.Prednisone Dosepak taper as directed. Physicaland occupationaltherapy evaluationscompleted 09/07/2016 with recommendations of physical medicine rehabilitation consult.Patient was admitted for a comprehensive rehabilitation program.      Past Medical History      Past Medical History:  Diagnosis Date  . Anxiety   . Bipolar disorder (HCC)   . COPD (chronic obstructive pulmonary disease) (HCC)   . GERD (gastroesophageal reflux disease)   . Hypertension     Family History  family history is not on file.  Prior Rehab/Hospitalizations:  Has the patient had major surgery during 100 days prior to admission? No  Current Medications   Current Facility-Administered Medications:  .  0.9 %  sodium chloride infusion, 250 mL, Intravenous, PRN, Almon Herculesaye T Gonfa, MD, Stopped at 09/04/16 1730 .  acetaminophen (TYLENOL) tablet 650 mg, 650 mg, Oral, Q6H PRN, Jordan HawksJennifer Chahn-Yang Choi, DO, 650 mg at 09/09/16 0814 .  albuterol (PROVENTIL) (2.5 MG/3ML)  0.083% nebulizer solution 2.5 mg, 2.5 mg, Nebulization, Q2H PRN, Almon Herculesaye T Gonfa, MD .  atorvastatin (LIPITOR) tablet 10 mg, 10 mg, Oral, Daily, Jordan HawksJennifer Chahn-Yang Choi, DO, 10 mg at 09/09/16 [id number] .  budesonide (PULMICORT) nebulizer solution 0.5 mg, 0.5 mg, Nebulization, BID, Arvilla Marketatherine Lauren Wallace, DO, 0.5 mg at 09/09/16 0827 .  chlorhexidine (PERIDEX) 0.12 % solution 15 mL, 15 mL, Mouth Rinse, BID, Jose Angelo A de SandstoneDios, MD, 15 mL at 09/09/16 [id number] .  furosemide (LASIX) tablet 40 mg, 40 mg, Oral, Daily, Leatha Gildingostin M Gherghe, MD, 40 mg at 09/08/16 0932 .  guaiFENesin-dextromethorphan (ROBITUSSIN DM) 100-10 MG/5ML syrup 5 mL, 5 mL, Oral, Q4H PRN, Leatha Gildingostin M Gherghe, MD, 5 mL at 09/06/16 2036 .  hydrOXYzine (ATARAX/VISTARIL) tablet 25 mg, 25 mg, Oral, Q6H PRN, Louann SjogrenJose Angelo A de Dios, MD, 25 mg at 09/09/16 0815 .  insulin aspart (novoLOG) injection 0-20 Units, 0-20 Units, Subcutaneous, TID WC, Jordan HawksJennifer Chahn-Yang Choi, DO, 3 Units at 09/08/16 1826 .  insulin aspart (novoLOG) injection 0-5 Units, 0-5 Units, Subcutaneous, QHS, Jordan HawksJennifer Chahn-Yang Choi, DO, 4 Units at 09/07/16 2129 .  insulin aspart (novoLOG) injection 5 Units, 5 Units, Subcutaneous, TID WC, Jordan HawksJennifer Chahn-Yang Choi, DO, 5 Units at 09/09/16 [phone] .  insulin glargine (LANTUS) injection 14 Units, 14 Units, Subcutaneous, Q24H, Jordan HawksJennifer Chahn-Yang Choi, DO, 14 Units at 09/09/16 0215 .  ipratropium-albuterol (DUONEB) 0.5-2.5 (3) MG/3ML nebulizer solution 3 mL, 3 mL, Nebulization, QID, Leatha Gildingostin M Gherghe, MD, 3 mL at 09/09/16 0824 .  labetalol (NORMODYNE) tablet 300 mg, 300 mg, Oral, TID, Jordan HawksJennifer Chahn-Yang Choi, DO, 300 mg at 09/09/16 [id number] .  labetalol (NORMODYNE,TRANDATE) injection 10 mg, 10 mg, Intravenous, Q2H PRN, Arvilla Marketatherine Lauren Wallace, DO, 10 mg at 08/30/16 1453 .  MEDLINE mouth rinse, 15 mL, Mouth Rinse, q12n4p, Jose Angelo A Christene Slatesde Dios, MD, 15 mL at 09/08/16 1827 .  mirtazapine (REMERON SOL-TAB) disintegrating tablet 30 mg, 30 mg, Oral, QHS,  Jennifer Chahn-Yang Choi, DO, 30 mg at 09/08/16 2147 .  morphine (MSIR) tablet 15 mg, 15 mg, Oral, q12n4p, Jordan HawksJennifer Chahn-Yang Choi, DO, 15 mg at 09/09/16 [id number] .  ondansetron (ZOFRAN) injection 4 mg, 4 mg, Intravenous, Q6H PRN, Karl ItoSteven E Sommer, MD .  predniSONE (STERAPRED UNI-PAK 21 TAB) tablet 10 mg, 10 mg, Oral, 4X daily taper, Waymon Budgelinton D Young, MD, 10  mg at 09/09/16 0800 .  risperiDONE (RISPERDAL) tablet 2 mg, 2 mg, Oral, BID, Jordan Hawks, DO, 2 mg at 09/09/16 0936 .  sulfamethoxazole-trimethoprim (BACTRIM DS,SEPTRA DS) 800-160 MG per tablet 1 tablet, 1 tablet, Oral, BID, Jordan Hawks, DO, 1 tablet at 09/09/16 0936 .  Valproate Sodium (DEPAKENE) solution 500 mg, 500 mg, Oral, BID, Carlton Adam Choi, DO, 500 mg at 09/09/16 4098  Patients Current Diet: Diet Carb Modified Fluid consistency: Thin; Room service appropriate? Yes  Precautions / Restrictions Precautions Precautions: Fall Restrictions Weight Bearing Restrictions: No   Has the patient had 2 or more falls or a fall with injury in the past year?No  Prior Activity Level Household: Prior to admission patient was diabled due to breathing issues.  Spouse drove and patient left the house about once a month for medical appointments.    Home Assistive Devices / Equipment Home Assistive Devices/Equipment: None Home Equipment: Environmental consultant - 2 wheels, Bedside commode, Shower seat, Wheelchair - manual, [employer] - single point  Prior Device Use: Indicate devices/aids used by the patient prior to current illness, exacerbation or injury? Walker  Prior Functional Level Prior Function Level of Independence: Needs assistance Gait / Transfers Assistance Needed: Modified independent with rolling walker for household distances. Uses w/c if out ADL's / Homemaking Assistance Needed: mod independent with basic ADL. Husband cooked/cleaned. Became SOB with ADL at times. Used w/c for community ditances at baselind  Self  Care: Did the patient need help bathing, dressing, using the toilet or eating?  Needed some help  Indoor Mobility: Did the patient need assistance with walking from room to room (with or without device)? Independent  Stairs: Did the patient need assistance with internal or external stairs (with or without device)? Independent  Functional Cognition: Did the patient need help planning regular tasks such as shopping or remembering to take medications? Independent  Current Functional Level Cognition Overall Cognitive Status: Within Functional Limits for tasks assessed Orientation Level: Oriented X4    Extremity Assessment (includes Sensation/Coordination) Upper Extremity Assessment: Generalized weakness  Lower Extremity Assessment: Defer to PT evaluation RLE Deficits / Details: strength grossly 2+/5. Fatigues quickly. LLE Deficits / Details: strength grossly 2+/5. Fatigues quickly.   ADLs Overall ADL's : Needs assistance/impaired Eating/Feeding: Modified independent Grooming: Set up, Sitting Upper Body Bathing: Minimal assitance, Sitting Lower Body Bathing: Moderate assistance, Sit to/from stand Upper Body Dressing : Minimal assistance, Sitting Lower Body Dressing: Moderate assistance, Sit to/from stand Toilet Transfer: +2 for physical assistance, Minimal assistance, BSC (with use of stedy) Toileting- Clothing Manipulation and Hygiene: Total assistance Functional mobility during ADLs: Minimal assistance, +2 for physical assistance (with stedy) General ADL Comments: Pt very motivated to become more independent   Mobility Overal bed mobility: Needs Assistance Bed Mobility: Sit to Supine Supine to sit: Min assist Sit to supine: Mod assist General bed mobility comments: assist to lift BLE onto bed   Transfers Overall transfer level: Needs assistance Equipment used:  (stedy) Transfer via Lift Equipment: Stedy Transfers: Sit to/from Stand, Pharmacologist Sit to Stand: Min  assist, From elevated surface (with pt pulling on lift bar) Stand pivot transfers: Mod assist, +2 physical assistance General transfer comment: Excellent use of stedy to help with mobility.    Ambulation / Gait / Stairs / Wheelchair Mobility Ambulation/Gait General Gait Details: Pt too weak to amb   Posture / Balance Dynamic Sitting Balance Sitting balance - Comments: UE support Balance Overall balance assessment: Needs assistance Sitting-balance support: Feet supported Sitting balance-Leahy  Scale: Fair Sitting balance - Comments: UE support Standing balance support: Bilateral upper extremity supported Standing balance-Leahy Scale: Poor Standing balance comment: Stedy and min A to maintain static balance   Special needs/care consideration BiPAP/CPAP: BiPAP at night  CPM: No Continuous Drip IV: No Dialysis: No         Life Vest: No Oxygen: 2L PTA via nasal cannula; 3L nasal cannula now Special Bed: No Trach Size: No Wound Vac (area): No      Skin: Bruises on her arms                               Bowel mgmt: 09/07/16 per patient report  Bladder mgmt: Intermittent incontinence with noted order this am per chart review; patient reports more urgency and frequency now with Lasix  Diabetic mgmt: Patient reports that she just watched what she ate PTA     Previous Home Environment Living Arrangements: Spouse/significant other Available Help at Discharge: Family, Available 24 hours/day Type of Home: House Home Layout: One level Home Access: Stairs to enter Entrance Stairs-Rails: Right Entrance Stairs-Number of Steps: 5 Bathroom Shower/Tub: Health visitor: Standard Home Care Services: No  Discharge Living Setting Plans for Discharge Living Setting: Patient's home, Lives with (comment) (spouse) Type of Home at Discharge: Mobile home Discharge Home Layout: One level Discharge Home Access: Stairs to enter Entrance Stairs-Rails: Left Entrance Stairs-Number of  Steps: 5 Discharge Bathroom Shower/Tub: Walk-in shower Discharge Bathroom Toilet: Standard Discharge Bathroom Accessibility: Yes How Accessible: Accessible via walker Does the patient have any problems obtaining your medications?: Yes (Describe) (pt reports medications are expensive )  Social/Family/Support Systems Patient Roles: Spouse, Parent Contact Information: Spouse: Marjon Doxtater [phone] Anticipated Caregiver: Spouse  Anticipated Caregiver's Contact Information: [phone] Ability/Limitations of Caregiver: None Caregiver Availability: 24/7 Discharge Plan Discussed with Primary Caregiver: Yes Is Caregiver In Agreement with Plan?: Yes Does Caregiver/Family have Issues with Lodging/Transportation while Pt is in Rehab?: No  Goals/Additional Needs Patient/Family Goal for Rehab: PT Mod I; OT Mod I-Supervision  Expected length of stay: 13-16 days  Cultural Considerations: None Dietary Needs: Carb. Mod. Equipment Needs: TBD Special Service Needs: None Additional Information: Patient has been admitted to [facility] for beathing issues in September and October Pt/Family Agrees to Admission and willing to participate: Yes Program Orientation Provided & Reviewed with Pt/Caregiver Including Roles  & Responsibilities: Yes Additional Information Needs: None Information Needs to be Provided By: N/A   Decrease burden of Care through IP rehab admission: No  Possible need for SNF placement upon discharge: No  Patient Condition: This patient's medical and functional status has changed since the consult dated: 09/07/16 at 1354 in which the Rehabilitation Physician determined and documented that the patient's condition is appropriate for intensive rehabilitative care in an inpatient rehabilitation facility. See "History of Present Illness" (above) for medical update. Functional changes are: Mod assist bed mobility. Patient's medical and functional status update has been  discussed with the Rehabilitation physician and patient remains appropriate for inpatient rehabilitation. Will admit to inpatient rehab today.   Preadmission Screen Completed By:  Fae Pippin, 09/09/2016 11:11 AM ______________________________________________________________________   Discussed status with Dr. Wynn Banker on 09/09/16 at 1400 and received telephone approval for admission today.  Admission Coordinator:  Fae Pippin, time 1400/Date 09/09/16       Cosigned by: Erick Colace, MD at 09/09/2016 2:50 PM

## 2016-09-09 NOTE — Progress Notes (Signed)
Patient discharged to [facility]. 

## 2016-09-09 NOTE — Progress Notes (Signed)
Placed pt on Bipap with full face mask tolerating well no issues to report

## 2016-09-09 NOTE — H&P (Signed)
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 Per chart review patient lives with spouse. One level homewith 5 steps to entry. Modified independent with rolling walker for household distances prior to admission. Uses a wheelchair outside of the home. Presented to [facility] 08/22/2016 for acute on chronic respiratory failure with low-grade fever and hypoxic in the 70s and required intubation. Findings of mild hyponatremia 128. Started on Solu-Medrol and nebulizer treatments. Transferred to [facility] 08/26/2016 for ongoing medical care. Chest x-ray was negative for acute abnormality. CT angiogram of the chest negative for pulmonary emboli but concerning for pneumonia. RSV-B positive. Completed a course of broad-spectrum antibiotics. Pulmonary care service follow-up remain intubated through 09/03/2016. Prednisone Dosepak taper as directed.  Physical and occupational therapy evaluations completed 09/07/2016 with recommendations of physical medicine rehabilitation consult. Patient was admitted for a comprehensive rehabilitation program   Patient used her nebulizer treatments approximately 6 times per day at home, took her steroid inhaler twice a day Complaining of frequent urination but no pain, positive incontinence of bladder, only  Review of Systems  Constitutional: Negative for chills and fever.  HENT: Negative for hearing loss and tinnitus.   Eyes: Negative for blurred vision and double vision.  Respiratory: Positive for cough, shortness of breath and  wheezing. Negative for hemoptysis and sputum production.   Cardiovascular: Positive for leg swelling. Negative for chest pain and palpitations.  Gastrointestinal: Positive for constipation and nausea. Negative for vomiting.       GERD  Genitourinary: Negative for dysuria, flank pain and hematuria.  Musculoskeletal: Positive for joint pain and myalgias.  Skin: Negative for rash.  Neurological: Positive for dizziness, weakness and headaches. Negative for seizures.  Psychiatric/Behavioral:       Anxiety, bipolar disorder  All other systems reviewed and are negative.      Past Medical History:  Diagnosis Date  . Anxiety   . Bipolar disorder ([place])   . COPD (chronic obstructive pulmonary disease) ([place])   . GERD (gastroesophageal reflux disease)   . Hypertension    No past surgical history on file. No family history on file. Social History:  has no tobacco, alcohol, and drug history on file. Allergies:       Allergies  Allergen Reactions  . Fentanyl Shortness Of Breath  . Meperidine Other (See Comments)    chest pain-"feels like squeezing heart"  . Azithromycin Other (See Comments)    VRE  . Codeine Nausea Only  . Levofloxacin Other (See Comments)    Unknown  . Other Other (See Comments)    BLACK STICHES=BODY REJECTS THEM  . Penicillins Rash  . Sulfa Antibiotics Rash    Tolerates Bactrim         Medications Prior to Admission  Medication Sig Dispense Refill  . albuterol (PROVENTIL HFA;VENTOLIN HFA) 108 (90 Base) MCG/ACT inhaler Inhale 2 puffs into the lungs every 4 (four) hours as needed for wheezing or shortness of breath.    Marland Kitchen atorvastatin (LIPITOR) 10 MG tablet Take 10 mg by mouth daily.    . budesonide (PULMICORT) 0.5 MG/2ML nebulizer solution Take 0.5 mg by nebulization 2 (two) times daily.    Marland Kitchen  budesonide-formoterol (SYMBICORT) 160-4.5 MCG/ACT inhaler Inhale 2 puffs into the lungs 2 (two) times daily.    . furosemide (LASIX) 20 MG tablet Take  20 mg by mouth daily.    . hydrOXYzine (VISTARIL) 25 MG capsule Take 25 mg by mouth every 6 (six) hours as needed for anxiety.     Marland Kitchen ipratropium (ATROVENT) 0.02 % nebulizer solution Take 0.5 mg by nebulization 3 (three) times daily.     Marland Kitchen ipratropium-albuterol (DUONEB) 0.5-2.5 (3) MG/3ML SOLN Take 3 mLs by nebulization every 4 (four) hours as needed.     Marland Kitchen LORazepam (ATIVAN) 0.5 MG tablet Take 0.5 mg by mouth every 8 (eight) hours as needed for anxiety.    . meloxicam (MOBIC) 15 MG tablet Take 15 mg by mouth daily.    . metoprolol succinate (TOPROL-XL) 50 MG 24 hr tablet Take 50 mg by mouth daily. Take with or immediately following a meal.    . mirtazapine (REMERON) 30 MG tablet Take 45 mg by mouth at bedtime.    Marland Kitchen morphine (MSIR) 15 MG tablet Take 15 mg by mouth 2 (two) times daily.    Marland Kitchen omeprazole (PRILOSEC) 40 MG capsule Take 40 mg by mouth daily.    . risperiDONE (RISPERDAL) 2 MG tablet Take 2 mg by mouth at bedtime.     . sulfamethoxazole-trimethoprim (BACTRIM DS,SEPTRA DS) 800-160 MG tablet Take 1 tablet by mouth 2 (two) times daily.    Marland Kitchen valproic acid (DEPAKENE) 250 MG capsule Take 500 mg by mouth 2 (two) times daily.     . cefdinir (OMNICEF) 300 MG capsule Take 300 mg by mouth 2 (two) times daily.      Home: Home Living Family/patient expects to be discharged to:: Private residence Living Arrangements: Spouse/significant other Available Help at Discharge: Family, Available 24 hours/day Type of Home: House Home Access: Stairs to enter [employer] of Steps: 5 Entrance Stairs-Rails: Right Home Layout: One level Bathroom Shower/Tub: Multimedia programmer: [place]: Environmental consultant - 2 wheels, Bedside commode, Shower seat, Wheelchair - manual, Radio producer - single point   Functional History: Prior Function Level of Independence: Needs assistance Gait / Transfers Assistance Needed: Modified independent with rolling walker for  household distances. Uses w/c if out ADL's / Homemaking Assistance Needed: mod independent with basic ADL. Husband cooked/cleaned. Became SOB with ADL at times. Used w/c for community ditances at baselind  Functional Status:  Mobility: Bed Mobility Overal bed mobility: Needs Assistance Bed Mobility: Sit to Supine Supine to sit: Min assist Sit to supine: Mod assist General bed mobility comments: assist to lift BLE onto bed Transfers Overall transfer level: Needs assistance Equipment used:  (stedy) Transfer via Lift Equipment: Stedy Transfers: Sit to/from Stand, Risk manager Sit to Stand: Min assist, From elevated surface (with pt pulling on lift bar) Stand pivot transfers: Mod assist, +2 physical assistance General transfer comment: Excellent use of stedy to help with mobility.  Ambulation/Gait General Gait Details: Pt too weak to amb    ADL: ADL Overall ADL's : Needs assistance/impaired Eating/Feeding: Modified independent Grooming: Set up, Sitting Upper Body Bathing: Minimal assitance, Sitting Lower Body Bathing: Moderate assistance, Sit to/from stand Upper Body Dressing : Minimal assistance, Sitting Lower Body Dressing: Moderate assistance, Sit to/from stand Toilet Transfer: +2 for physical assistance, Minimal assistance, BSC (with use of stedy) Toileting- Clothing Manipulation and Hygiene: Total assistance Functional mobility during ADLs: Minimal assistance, +2 for physical assistance (with stedy) General ADL Comments: Pt very motivated to become more independent  Cognition: Cognition Overall Cognitive Status: Within Functional Limits for tasks assessed Orientation Level: Oriented X4 Cognition Arousal/Alertness: Awake/alert Behavior During Therapy: WFL for tasks assessed/performed Overall Cognitive Status: Within Functional Limits for tasks assessed  Physical Exam: Blood pressure (!) 104/53, pulse 69, temperature 98.3 F (36.8 C), temperature source  Axillary, resp. rate 17, height 5' (1.524 m), weight 80.7 kg (177 lb 14.6 oz), SpO2 91 %. Physical Exam  Constitutional: She appears well-developed.  HENT:  Head: Normocephalic.  Eyes: EOM are normal.  Neck: Normal range of motion. Neck supple. No tracheal deviation present. No thyromegaly present.  Cardiovascular: Normal rate and normal heart sounds.   Respiratory: No respiratory distress.  Decreased breath sounds at the bases but clear to auscultation. Occasional wheeze right anterior lung fields Oxygen nasal cannula in place.  GI: Soft. Bowel sounds are normal. She exhibits no distension. There is no tenderness.  Neurological: She is alertand oriented to person, place, and time.  UE: 4-deltoid, 4/5 bicep, tricep and 4/5 wrist and HI. LE: 2+ to 3-HF, 3 KE and 4-/5 ADF/PF. Sensation  intact to light touch bilateral upper and lower extremities Skin: Skin is warmand dry.  Psychiatric: She has a normal mood and affect. Her behavior is normal  Lab Results Last 48 Hours        Results for orders placed or performed during the hospital encounter of 08/26/16 (from the past 48 hour(s))  Glucose, capillary     Status: Abnormal   Collection Time: 09/06/16  7:26 AM  Result Value Ref Range   Glucose-Capillary 116 (H) 65 - 99 mg/dL   Comment 1 Notify RN    Comment 2 Document in Chart   Glucose, capillary     Status: Abnormal   Collection Time: 09/06/16 11:40 AM  Result Value Ref Range   Glucose-Capillary 155 (H) 65 - 99 mg/dL   Comment 1 Notify RN    Comment 2 Document in Chart   Glucose, capillary     Status: Abnormal   Collection Time: 09/06/16  4:52 PM  Result Value Ref Range   Glucose-Capillary 152 (H) 65 - 99 mg/dL  Glucose, capillary     Status: Abnormal   Collection Time: 09/06/16  8:31 PM  Result Value Ref Range   Glucose-Capillary 228 (H) 65 - 99 mg/dL   Comment 1 Notify RN   Glucose, capillary     Status: Abnormal   Collection Time: 09/06/16 10:36 PM   Result Value Ref Range   Glucose-Capillary 195 (H) 65 - 99 mg/dL   Comment 1 Notify RN   CBC     Status: Abnormal   Collection Time: 09/07/16  2:38 AM  Result Value Ref Range   WBC 9.4 4.0 - 10.5 K/uL   RBC 4.14 3.87 - 5.11 MIL/uL   Hemoglobin 10.8 (L) 12.0 - 15.0 g/dL   HCT 35.1 (L) 36.0 - 46.0 %   MCV 84.8 78.0 - 100.0 fL   MCH 26.1 26.0 - 34.0 pg   MCHC 30.8 30.0 - 36.0 g/dL   RDW 17.3 (H) 11.5 - 15.5 %   Platelets 210 150 - 400 K/uL  Basic metabolic panel     Status: Abnormal   Collection Time: 09/07/16  2:38 AM  Result Value Ref Range   Sodium 136 135 - 145 mmol/L   Potassium 4.3 3.5 - 5.1 mmol/L    Comment: DELTA CHECK NOTED   Chloride 92 (L) 101 - 111 mmol/L   CO2 35 (H) 22 - 32 mmol/L  Glucose, Bld 152 (H) 65 - 99 mg/dL   BUN 15 6 - 20 mg/dL   Creatinine, Ser 0.39 (L) 0.44 - 1.00 mg/dL   Calcium 9.1 8.9 - 10.3 mg/dL   GFR calc non Af Amer >60 >60 mL/min   GFR calc Af Amer >60 >60 mL/min    Comment: (NOTE) The eGFR has been calculated using the CKD EPI equation. This calculation has not been validated in all clinical situations. eGFR's persistently <60 mL/min signify possible Chronic Kidney Disease.   Anion gap 9 5 - 15  Glucose, capillary     Status: Abnormal   Collection Time: 09/07/16  8:15 AM  Result Value Ref Range   Glucose-Capillary 107 (H) 65 - 99 mg/dL  Glucose, capillary     Status: None   Collection Time: 09/07/16 12:00 PM  Result Value Ref Range   Glucose-Capillary 94 65 - 99 mg/dL  Glucose, capillary     Status: Abnormal   Collection Time: 09/07/16  2:06 PM  Result Value Ref Range   Glucose-Capillary 160 (H) 65 - 99 mg/dL  Glucose, capillary     Status: Abnormal   Collection Time: 09/07/16  9:26 PM  Result Value Ref Range   Glucose-Capillary 173 (H) 65 - 99 mg/dL  CBC     Status: Abnormal   Collection Time: 09/08/16  2:45 AM  Result Value Ref Range   WBC 10.3 4.0 - 10.5 K/uL   RBC 4.22 3.87 - 5.11  MIL/uL   Hemoglobin 11.1 (L) 12.0 - 15.0 g/dL   HCT 36.2 36.0 - 46.0 %   MCV 85.8 78.0 - 100.0 fL   MCH 26.3 26.0 - 34.0 pg   MCHC 30.7 30.0 - 36.0 g/dL   RDW 18.1 (H) 11.5 - 15.5 %   Platelets 197 150 - 400 K/uL  Basic metabolic panel     Status: Abnormal   Collection Time: 09/08/16  2:45 AM  Result Value Ref Range   Sodium 136 135 - 145 mmol/L   Potassium 4.3 3.5 - 5.1 mmol/L   Chloride 93 (L) 101 - 111 mmol/L   CO2 33 (H) 22 - 32 mmol/L   Glucose, Bld 117 (H) 65 - 99 mg/dL   BUN 14 6 - 20 mg/dL   Creatinine, Ser 0.50 0.44 - 1.00 mg/dL   Calcium 9.3 8.9 - 10.3 mg/dL   GFR calc non Af Amer >60 >60 mL/min   GFR calc Af Amer >60 >60 mL/min    Comment: (NOTE) The eGFR has been calculated using the CKD EPI equation. This calculation has not been validated in all clinical situations. eGFR's persistently <60 mL/min signify possible Chronic Kidney Disease.   Anion gap 10 5 - 15     Imaging Results (Last 48 hours)  No results found.       Medical Problem List and Plan: 1.  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 Oxygen dependent prior to admission with BiPAP at home 2.  DVT Prophylaxis/Anticoagulation: SCDs. Check vascular studies 3. Pain Management/chronic back pain: MSIR 15 mg twice a day 4. Mood/bipolar disorder: Remeron 30 mg daily, Risperdal 2 mg twice a day, Depakene 500 mg twice a day 5. Neuropsych: This patient is capable of making decisions on her own behalf. 6. Skin/Wound Care: Routine skin checks 7. Fluids/Electrolytes/Nutrition: Routine I&O with follow-up chemistries 8. Chronic granulomatous disease. Continue Bactrim prophylaxis 9. Diabetes mellitus and peripheral neuropathy. Hemoglobin A1c 6.5. Lantus insulin 14 units daily, NovoLog  5 units 3 times a day. Check blood sugars before meals and at bedtime 10. Hypertension. Labetalol 300 mg 3 times a day, . Monitor with  increased mobility 11. Acute on chronic diastolic congestive heart failure. Lasix 40 mg daily. Monitor for any signs of fluid overload 12. Hyperlipidemia. Lipitor 13. Urinary incontinence. Check UA, C&S  Post Admission Physician Evaluation: 1. 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  . 2. Patient is admitted to receive collaborative, interdisciplinary care between the physiatrist, rehab nursing staff, and therapy team. 3. Patient's level of medical complexity and substantial therapy needs in context of that medical necessity cannot be provided at a lesser intensity of care such as a SNF. 4. Patient has experienced substantial functional loss from his/her baseline which was documented above under the "Functional History" and "Functional Status" headings.  Judging by the patient's diagnosis, physical exam, and functional history, the patient has potential for functional progress which will result in measurable gains while on inpatient rehab.  These gains will be of substantial and practical use upon discharge  in facilitating mobility and self-care at the household level. 5. Physiatrist will provide 24 hour management of medical needs as well as oversight of the therapy plan/treatment and provide guidance as appropriate regarding the interaction of the two. 6. The Preadmission Screening has been reviewed and patient status is unchanged unless otherwise stated above. 7. 24 hour rehab nursing will assist with bladder management, bowel management, safety, skin/wound care, disease management, medication administration, pain management and patient education  and help integrate therapy concepts, techniques,education, etc. 8. PT will assess and treat for/with: pre gait, gait training, endurance , safety, equipment, neuromuscular re education.   Goals are: mod I/Sup. 9. OT will assess and treat for/with: ADLs, Cognitive perceptual skills, Neuromuscular re  education, safety, endurance, equipment.   Goals are: Mod I/Sup. Therapy may proceed with showering this patient. 10. SLP will assess and treat for/with: NA.  Goals are: NA. 11. Case Management and Social Worker will assess and treat for psychological issues and discharge planning. 12. Team conference will be held weekly to assess progress toward goals and to determine barriers to discharge. 13. Patient will receive at least 3 hours of therapy per day at least 5 days per week. 14. ELOS: 13-16d       15. Prognosis:  good     Charlett Blake M.D. [facility] FAAPM&R (Sports Med, Neuromuscular Med) Diplomate Am Board of Electrodiagnostic Med  09/09/2016

## 2016-09-09 NOTE — Progress Notes (Signed)
CSW spoke with pt concern SNF if CIR is not an option.  Pt states she plans to return home if she cannot go to CIR but is very hopeful CIR will be an option.  Patient and husband spoke very highly of [facility] and the care they have received here.  No further CSW needs- RNCM informed that pt would need home services if CIR not an option  CSW signing off  Burna SisJenna H. Yoav Okane, LCSW Clinical Social Worker [phone]

## 2016-09-09 NOTE — Progress Notes (Signed)
PULMONARY / CRITICAL CARE MEDICINE   Name: Cheryl Wheeler MRN: [id number] DOB: 1966/03/08    ADMISSION DATE:  08/26/2016  [facility]  [facility]  CHIEF COMPLAINT:  Dyspnea  BRIEF:  Cheryl Wheeler is a 50 year old female with ?end-stage COPD on 3 L oxygen and BiPAP at home, chronic granulomatous disease, hypertension, bipolar disorder, anxiety and GERD who [facility] after needing continuous positive pressure ventilation with BiPAP for 3 days.  On 08/22/2016, patient presented to [facility] with dyspnea, dry cough and subjective fever and hypoxia to 70's. Initial ABG was 7.4/66/45/41 [facility]. CBC significant for mild leukocytosis. BMP remarkable for hypoosmolar hyponatremia to 128 otherwise within normal limits. UA was remarkable for pyuria but urine culture was negative. She was started on Solu-Medrol, DuoNeb, Pulmicort and guanfacine-DM for COPD exacerbation. However, she continued to have worsening dyspnea with increased oxygen requirement on BiPAP continuously. She was also started on cefepime and vancomycin on 08/24/2016.  On the day of transfer, vitals and was normal limits except for tachypnea to 25, ABG was 7.4/65/75/41/ Last BMP within normal limits. Mild leukocytosis and hyponatremia resolved.   On arrival, patient is on BiPAP with some increased work of breathing. She reports headache at the site where the the BiPAP mask rests on her forehead, denies chest pain, admits shortness of breath, denies abdominal pain, denies dysuria and admits chronic back pain. She reports swelling in the right foot and right calf for about a month prior to presentation to [facility] that has resolved.  Patient likes to have intubation or trach done.   SIGNIFICANT EVENTS: 08/22/2016: [facility] where his acute on chronic COPD exacerbation and hypoosmolar hyponatremia 08/24/2016: Started on vancomycin and  cefepime 08/26/2016: [facility] due to continuous need for BiPAP and increased oxygen requirement.  08/27/2016: Intubated. CT chest negative for PE.  11/18- Bronch RML, no DAH, some erthyema blood, BAL sent, tolerated 11/19- versed drip required  SUBJECTIVE:  Mild increase wob   VITAL SIGNS: BP 133/60   Pulse 66   Temp 98.3 F (36.8 C) (Axillary)   Resp 15   Ht 5' (1.524 m)   Wt 180 lb 5.4 oz (81.8 kg)   SpO2 (!) 87%   BMI 35.22 kg/m   HEMODYNAMICS:    VENTILATOR SETTINGS:    INTAKE / OUTPUT: I/O last 3 completed shifts: In: 920 [P.O.:920] Out: -   PHYSICAL EXAMINATION: GEN: lying in bed. Awake. Calm. Occasionally anxious Oropharynx: Weak/ hoarse voice.  CVS: s1 s2 RRT RESP: Coarse breath sounds, increased wob at rest GI: Bowel sounds present and normal, soft, non-tender,non-distended GU:  NEURO: awake, follows commands. (-) lateralizing signs.   LABS:  BMET  Recent Labs Lab 09/07/16 0238 09/08/16 0245 09/09/16 0317  NA 136 136 137  K 4.3 4.3 4.0  CL 92* 93* 92*  CO2 35* 33* 34*  BUN 15 14 13   CREATININE 0.39* 0.50 0.35*  GLUCOSE 152* 117* 137*    Electrolytes  Recent Labs Lab 09/04/16 0309 09/05/16 0312 09/06/16 0212 09/07/16 0238 09/08/16 0245 09/09/16 0317  CALCIUM 9.6 9.5 9.2 9.1 9.3 9.1  MG 2.3 2.1 2.0  --   --   --   PHOS 4.4 3.9 3.3  --   --   --     CBC  Recent Labs Lab 09/07/16 0238 09/08/16 0245 09/09/16 0317  WBC 9.4 10.3 8.8  HGB 10.8* 11.1* 10.5*  HCT 35.1*  36.2 35.0*  PLT 210 197 181    Coag's No results for input(s): APTT, INR in the last 168 hours.  Sepsis Markers No results for input(s): LATICACIDVEN, PROCALCITON, O2SATVEN in the last 168 hours.  ABG No results for input(s): PHART, PCO2ART, PO2ART in the last 168 hours.  Liver Enzymes  Recent Labs Lab 09/06/16 0212  AST 15  ALT 37  ALKPHOS 34*  BILITOT 0.3  ALBUMIN 3.4*    Cardiac Enzymes No results for  input(s): TROPONINI, PROBNP in the last 168 hours.  Glucose  Recent Labs Lab 09/07/16 2126 09/08/16 0759 09/08/16 0936 09/08/16 1300 09/08/16 1745 09/08/16 2120  GLUCAP 173* 88 84 106* 136* 140*    Imaging No results found.   STUDIES:  11/16: LE doppler negative for DVT  11/18 Bronch , RML, no DAH, some erythema blood rml  CULTURES: Culture at OSH negative BAL RML11/18>>>re-incubated for better growth  Urine cx: 10k colonies of yeast   ANTIBIOTICS: 08/24/2016 cefepime 08/26/2016 08/24/2016 vancomycin 08/26/2016 11/21 rocephin > 11/24  LINES/TUBES: Foley 11/21 >>  11/17 ett>>>11/24  DISCUSSION: Cheryl Wheeler is a 50 year old female with ?end-stage COPD on 3 L ( She says 2 L at home) oxygen and BiPAP at home [facility] for Acute on chronic respiratory failure due to COPD exacerbation. Pt known to have CGD. May be going to rehab soon  ASSESSMENT / PLAN:  Acute on chronic hypoxemic hypercapneic resp fx 2/2 AECOPD, OSA, OHS, RUL PNA, Pulm edema H/o  End-stage COPD on 3 L & nocturnal BiPAP No PE or DVT  Extubated on 11/24  On 3L o2 now. Residual hoarseness w/ stridor Pt with CGD. On Bactrim for PCP prophylaxis. Anxiety.   P:   Bipap prn daytime and needs at HS.  Will need a sleep study as outpt to determine best pressure.  On Bipap 14/7 currently. Bleed o2 to keep o2 sats > 88% O2 3L now 24/7. Try to cut down to keep o2 sats > 88% DuoNeb q6h, pulmicort BID > may need to d/c with hoarseness Albuterol nebs q2h prn  Steroids > changed to prednisone for taper Diuresce as able DVT prophylaxis Control anxiety. Seems stable now.   PCCM will see as needed  [facility] ACNP [facility] P[facility] Pager [phone] till 3 pm If no answer page [phone] 09/09/2016, 8:00 AM  Attending Note:  50 year old female with COPD, OSA, OHS and PNA presenting to PCCM with respiratory failure.  On exam, she is unlabored and bibasilar crackles.  I reviewed CXR myself, low  volume and pulmonary edema noted.  Discussed with PCCM-NP.  COPD:  - Steroids - prednisone with taper  - Duonebs  - Albuterol  Acute on chronic respiratory failure:  - Titrate O2 for sat of 88-92%.  - Ambulatory desat study for home O2 level.  - BiPAP change to QHS at this point.  PNA:  - Continue abx as ordered.  - F/U on cultures.  OSA:  - Will need an outpatient titration and sleep study.  PCCM will sign off, please call back if needed.  Patient seen and examined, agree with above note.  I dictated the care and orders written for this patient under my direction.  Alyson ReedyWesam G Yacoub, MD [phone]

## 2016-09-09 NOTE — PMR Pre-admission (Signed)
PMR Admission Coordinator Pre-Admission Assessment  Patient: Cheryl Wheeler is an 50 y.o., female MRN: [id number] DOB: 1966-10-09 Height: 5' (152.4 cm) Weight: 81.8 kg (180 lb 5.4 oz)              Insurance Information HMO:     PPO: X     PCP:      IPA:      80/20:      OTHER:  PRIMARY: A[employer] Medicare       Policy#: [id number]      Subscriber: Self CM Name: Regino BellowLotoya Anthony      Phone#: [phone]     Fax#: [phone] Pre-Cert#: [id number] for 5 days 09/09/16-09/13/16 with faxed updates due 09/12/16    Employer: Disabled  Benefits:  Phone #: [phone]     Name: reference # [id number] Eff. Date: 10/12/15     Deduct: $0      Out of Pocket Max: $4,950      Life Max: N/A CIR: $295 a day; max $1,770 per admission      SNF: $0 a day, days 1-20; $164 a day, days 21-100 Outpatient: PT/OT     Co-Pay: $40 a visit  Home Health: PT/OT       Co-Pay: none DME: 80%     Co-Pay: 20% Providers: in network   Medicaid Application Date:       Case Manager:  Disability Application Date:       Case Worker:   Emergency Conservator, museum/galleryContact Information Contact Information    Name Relation Home Work Mobile   Goldwire,[name] Spouse [phone]       Current Medical History  Patient Admitting Diagnosis: 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 Per chart review patient lives with spouse. One level homewith 5 steps to entry. Modified independent with rolling walker for household distances prior to admission. Uses a wheelchair outside of the home. Presented to [facility]  08/22/2016 for acute on chronic respiratory failure with low-grade fever and hypoxic in the 70s and required intubation. Findings of mild hyponatremia 128. Started on Solu-Medrol and nebulizer treatments. [facility]26/[facility] for ongoing medical care. Chest x-ray was negative for acute abnormality. CT angiogram of the chest negative for pulmonary emboli but concerning for pneumonia. RSV-B positive. Completed a course of broad-spectrum antibiotics. Pulmonary care service follow-up remain intubated through 09/03/2016. Prednisone Dosepak taper as directed.  Physical and occupational therapy evaluations completed 09/07/2016 with recommendations of physical medicine rehabilitation consult. Patient was admitted for a comprehensive rehabilitation program.      Past Medical History  Past Medical History:  Diagnosis Date  . Anxiety   . Bipolar disorder (HCC)   . COPD (chronic obstructive pulmonary disease) (HCC)   . GERD (gastroesophageal reflux disease)   . Hypertension     Family History  family history is not on file.  Prior Rehab/Hospitalizations:  Has the patient had major surgery during 100 days prior to admission? No  Current Medications   Current Facility-Administered Medications:  .  0.9 %  sodium chloride infusion, 250 mL, Intravenous, PRN, Almon Herculesaye T Gonfa, MD, Stopped at 09/04/16 1730 .  acetaminophen (TYLENOL)  tablet 650 mg, 650 mg, Oral, Q6H PRN, Jordan Hawks, DO, 650 mg at 09/09/16 9604 .  albuterol (PROVENTIL) (2.5 MG/3ML) 0.083% nebulizer solution 2.5 mg, 2.5 mg, Nebulization, Q2H PRN, Almon Hercules, MD .  atorvastatin (LIPITOR) tablet 10 mg, 10 mg, Oral, Daily, Jordan Hawks, DO, 10 mg at 09/09/16 5409 .  budesonide (PULMICORT) nebulizer solution 0.5 mg, 0.5 mg, Nebulization, BID, Arvilla Market, DO, 0.5 mg at 09/09/16 0827 .  chlorhexidine (PERIDEX) 0.12 % solution 15 mL, 15 mL, Mouth Rinse, BID, Jose Angelo A de Merino, MD, 15  mL at 09/09/16 8119 .  furosemide (LASIX) tablet 40 mg, 40 mg, Oral, Daily, Leatha Gilding, MD, 40 mg at 09/08/16 0932 .  guaiFENesin-dextromethorphan (ROBITUSSIN DM) 100-10 MG/5ML syrup 5 mL, 5 mL, Oral, Q4H PRN, Leatha Gilding, MD, 5 mL at 09/06/16 2036 .  hydrOXYzine (ATARAX/VISTARIL) tablet 25 mg, 25 mg, Oral, Q6H PRN, Louann Sjogren, MD, 25 mg at 09/09/16 0815 .  insulin aspart (novoLOG) injection 0-20 Units, 0-20 Units, Subcutaneous, TID WC, Jordan Hawks, DO, 3 Units at 09/08/16 1826 .  insulin aspart (novoLOG) injection 0-5 Units, 0-5 Units, Subcutaneous, QHS, Jordan Hawks, DO, 4 Units at 09/07/16 2129 .  insulin aspart (novoLOG) injection 5 Units, 5 Units, Subcutaneous, TID WC, Jordan Hawks, DO, 5 Units at 09/09/16 [phone] .  insulin glargine (LANTUS) injection 14 Units, 14 Units, Subcutaneous, Q24H, Jordan Hawks, DO, 14 Units at 09/09/16 0215 .  ipratropium-albuterol (DUONEB) 0.5-2.5 (3) MG/3ML nebulizer solution 3 mL, 3 mL, Nebulization, QID, Leatha Gilding, MD, 3 mL at 09/09/16 0824 .  labetalol (NORMODYNE) tablet 300 mg, 300 mg, Oral, TID, Jordan Hawks, DO, 300 mg at 09/09/16 2956 .  labetalol (NORMODYNE,TRANDATE) injection 10 mg, 10 mg, Intravenous, Q2H PRN, Arvilla Market, DO, 10 mg at 08/30/16 1453 .  MEDLINE mouth rinse, 15 mL, Mouth Rinse, q12n4p, Jose Angelo A Christene Slates, MD, 15 mL at 09/08/16 1827 .  mirtazapine (REMERON SOL-TAB) disintegrating tablet 30 mg, 30 mg, Oral, QHS, Jennifer Chahn-Yang Choi, DO, 30 mg at 09/08/16 2147 .  morphine (MSIR) tablet 15 mg, 15 mg, Oral, q12n4p, Jordan Hawks, DO, 15 mg at 09/09/16 2130 .  ondansetron (ZOFRAN) injection 4 mg, 4 mg, Intravenous, Q6H PRN, Karl Ito, MD .  predniSONE (STERAPRED UNI-PAK 21 TAB) tablet 10 mg, 10 mg, Oral, 4X daily taper, Waymon Budge, MD, 10 mg at 09/09/16 0800 .  risperiDONE (RISPERDAL) tablet 2 mg, 2 mg, Oral, BID,  Jordan Hawks, DO, 2 mg at 09/09/16 0936 .  sulfamethoxazole-trimethoprim (BACTRIM DS,SEPTRA DS) 800-160 MG per tablet 1 tablet, 1 tablet, Oral, BID, Jordan Hawks, DO, 1 tablet at 09/09/16 0936 .  Valproate Sodium (DEPAKENE) solution 500 mg, 500 mg, Oral, BID, Carlton Adam Choi, DO, 500 mg at 09/09/16 8657  Patients Current Diet: Diet Carb Modified Fluid consistency: Thin; Room service appropriate? Yes  Precautions / Restrictions Precautions Precautions: Fall Restrictions Weight Bearing Restrictions: No   Has the patient had 2 or more falls or a fall with injury in the past year?No  Prior Activity Level Household: Prior to admission patient was diabled due to breathing issues.  Spouse drove and patient left the house about once a month for medical appointments.    Home Assistive Devices / Equipment Home Assistive Devices/Equipment: None Home Equipment: Environmental consultant - 2 wheels, Bedside commode, Shower seat, Wheelchair - manual, [employer] - single point  Prior Device  Use: Indicate devices/aids used by the patient prior to current illness, exacerbation or injury? Walker  Prior Functional Level Prior Function Level of Independence: Needs assistance Gait / Transfers Assistance Needed: Modified independent with rolling walker for household distances. Uses w/c if out ADL's / Homemaking Assistance Needed: mod independent with basic ADL. Husband cooked/cleaned. Became SOB with ADL at times. Used w/c for community ditances at baselind  Self Care: Did the patient need help bathing, dressing, using the toilet or eating?  Needed some help  Indoor Mobility: Did the patient need assistance with walking from room to room (with or without device)? Independent  Stairs: Did the patient need assistance with internal or external stairs (with or without device)? Independent  Functional Cognition: Did the patient need help planning regular tasks such as shopping or remembering to take  medications? Independent  Current Functional Level Cognition  Overall Cognitive Status: Within Functional Limits for tasks assessed Orientation Level: Oriented X4    Extremity Assessment (includes Sensation/Coordination)  Upper Extremity Assessment: Generalized weakness  Lower Extremity Assessment: Defer to PT evaluation RLE Deficits / Details: strength grossly 2+/5. Fatigues quickly. LLE Deficits / Details: strength grossly 2+/5. Fatigues quickly.    ADLs  Overall ADL's : Needs assistance/impaired Eating/Feeding: Modified independent Grooming: Set up, Sitting Upper Body Bathing: Minimal assitance, Sitting Lower Body Bathing: Moderate assistance, Sit to/from stand Upper Body Dressing : Minimal assistance, Sitting Lower Body Dressing: Moderate assistance, Sit to/from stand Toilet Transfer: +2 for physical assistance, Minimal assistance, BSC (with use of stedy) Toileting- Clothing Manipulation and Hygiene: Total assistance Functional mobility during ADLs: Minimal assistance, +2 for physical assistance (with stedy) General ADL Comments: Pt very motivated to become more independent    Mobility  Overal bed mobility: Needs Assistance Bed Mobility: Sit to Supine Supine to sit: Min assist Sit to supine: Mod assist General bed mobility comments: assist to lift BLE onto bed    Transfers  Overall transfer level: Needs assistance Equipment used:  (stedy) Transfer via Lift Equipment: Stedy Transfers: Sit to/from Stand, Pharmacologist Sit to Stand: Min assist, From elevated surface (with pt pulling on lift bar) Stand pivot transfers: Mod assist, +2 physical assistance General transfer comment: Excellent use of stedy to help with mobility.     Ambulation / Gait / Stairs / Wheelchair Mobility  Ambulation/Gait General Gait Details: Pt too weak to amb    Posture / Balance Dynamic Sitting Balance Sitting balance - Comments: UE support Balance Overall balance assessment: Needs  assistance Sitting-balance support: Feet supported Sitting balance-Leahy Scale: Fair Sitting balance - Comments: UE support Standing balance support: Bilateral upper extremity supported Standing balance-Leahy Scale: Poor Standing balance comment: Stedy and min A to maintain static balance    Special needs/care consideration BiPAP/CPAP: BiPAP at night  CPM: No Continuous Drip IV: No Dialysis: No         Life Vest: No Oxygen: 2L PTA via nasal cannula; 3L nasal cannula now Special Bed: No Trach Size: No Wound Vac (area): No      Skin: Bruises on her arms                               Bowel mgmt: 09/07/16 per patient report  Bladder mgmt: Intermittent incontinence with noted order this am per chart review; patient reports more urgency and frequency now with Lasix  Diabetic mgmt: Patient reports that she just watched what she ate PTA  Previous Home Environment Living Arrangements: Spouse/significant other Available Help at Discharge: Family, Available 24 hours/day Type of Home: House Home Layout: One level Home Access: Stairs to enter Entrance Stairs-Rails: Right Entrance Stairs-Number of Steps: 5 Bathroom Shower/Tub: Health visitorWalk-in shower Bathroom Toilet: Standard Home Care Services: No  Discharge Living Setting Plans for Discharge Living Setting: Patient's home, Lives with (comment) (spouse) Type of Home at Discharge: Mobile home Discharge Home Layout: One level Discharge Home Access: Stairs to enter Entrance Stairs-Rails: Left Entrance Stairs-Number of Steps: 5 Discharge Bathroom Shower/Tub: Walk-in shower Discharge Bathroom Toilet: Standard Discharge Bathroom Accessibility: Yes How Accessible: Accessible via walker Does the patient have any problems obtaining your medications?: Yes (Describe) (pt reports medications are expensive )  Social/Family/Support Systems Patient Roles: Spouse, Parent Contact Information: Spouse: Christin BachRonnie Murdy [phone] Anticipated Caregiver:  Spouse  Anticipated Caregiver's Contact Information: [phone] Ability/Limitations of Caregiver: None Caregiver Availability: 24/7 Discharge Plan Discussed with Primary Caregiver: Yes Is Caregiver In Agreement with Plan?: Yes Does Caregiver/Family have Issues with Lodging/Transportation while Pt is in Rehab?: No  Goals/Additional Needs Patient/Family Goal for Rehab: PT Mod I; OT Mod I-Supervision  Expected length of stay: 13-16 days  Cultural Considerations: None Dietary Needs: Carb. Mod. Equipment Needs: TBD Special Service Needs: None Additional Information: [facility] September and October Pt/Family Agrees to Admission and willing to participate: Yes Program Orientation Provided & Reviewed with Pt/Caregiver Including Roles  & Responsibilities: Yes Additional Information Needs: None Information Needs to be Provided By: N/A   Decrease burden of Care through IP rehab admission: No  Possible need for SNF placement upon discharge: No  Patient Condition: This patient's medical and functional status has changed since the consult dated: 09/07/16 at 1354 in which the Rehabilitation Physician determined and documented that the patient's condition is appropriate for intensive rehabilitative care in an inpatient rehabilitation facility. See "History of Present Illness" (above) for medical update. Functional changes are: Mod assist bed mobility. Patient's medical and functional status update has been discussed with the Rehabilitation physician and patient remains appropriate for inpatient rehabilitation. Will admit to inpatient rehab today.   Preadmission Screen Completed By:  Fae PippinMelissa Arelyn Gauer, 09/09/2016 11:11 AM ______________________________________________________________________   Discussed status with Dr. Wynn BankerKirsteins on 09/09/16 at 1400 and received telephone approval for admission today.  Admission Coordinator:  Fae PippinMelissa Antanette Richwine,  time 1400/Date 09/09/16

## 2016-09-09 NOTE — Progress Notes (Signed)
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 right handed female with history of advanced COPD on 2-4 L of oxygen as well as BiPAP at home. Per chart review patient lives with spouse. One level home with 5 steps to entry. Modified independent with rolling walker for household distances prior to admission. Uses a wheelchair outside of the home. [facility] for acute on chronic respiratory failure with low-grade fever and hypoxic in the 70s and required intubation. Findings of mild hyponatremia 128. Started on Solu-Medrol and nebulizer treatments. [facility]/26/2016 for ongoing medical care. Chest x-ray was negative for acute abnormality. CT angiogram of the chest negative for pulmonary emboli. Maintain on broad-spectrum antibiotics. Pulmonary care service follow-up remain intubated through 09/03/2016. Physical therapy evaluation completed 09/07/2016 with recommendations of physical medicine rehabilitation consult   Review of Systems  Constitutional: Positive for fever. Negative for chills.  HENT: Negative for hearing loss and tinnitus.   Eyes: Negative for blurred vision and double vision.  Respiratory: Positive for cough and wheezing.   Cardiovascular: Positive for leg swelling. Negative for chest pain and palpitations.  Gastrointestinal: Positive for constipation and heartburn. Negative for vomiting.       GERD  Genitourinary: Negative for dysuria, flank pain and  hematuria.  Musculoskeletal: Positive for joint pain and myalgias.  Skin: Negative for rash.  Neurological: Positive for weakness. Negative for seizures and loss of consciousness.  Psychiatric/Behavioral:       Anxiety, bipolar disorder  All other systems reviewed and are negative.      Past Medical History:  Diagnosis Date  . Anxiety   . Bipolar disorder (HCC)   . COPD (chronic obstructive pulmonary disease) (HCC)   . GERD (gastroesophageal reflux disease)   . Hypertension    No past surgical history on file. No family history on file. Social History:  has no tobacco, alcohol, and drug history on file. Allergies:       Allergies  Allergen Reactions  . Fentanyl Shortness Of Breath  . Meperidine Other (See Comments)    chest pain-"feels like squeezing heart"  . Azithromycin Other (See Comments)    VRE  . Codeine Nausea Only  . Levofloxacin Other (See Comments)    Unknown  . Other Other (See Comments)    BLACK STICHES=BODY REJECTS THEM  . Penicillins Rash  . Sulfa Antibiotics Rash    Tolerates Bactrim         Medications Prior to Admission  Medication Sig Dispense Refill  . albuterol (PROVENTIL HFA;VENTOLIN HFA) 108 (90 Base) MCG/ACT inhaler Inhale 2 puffs into the lungs every 4 (four) hours as needed for wheezing or shortness of breath.    Marland Kitchen. atorvastatin (LIPITOR) 10 MG tablet Take 10 mg by mouth daily.    . budesonide (PULMICORT) 0.5 MG/2ML nebulizer solution Take 0.5 mg by nebulization 2 (two) times daily.    . budesonide-formoterol (SYMBICORT) 160-4.5 MCG/ACT inhaler Inhale 2 puffs into the lungs 2 (two) times daily.    . furosemide (LASIX) 20 MG tablet Take 20  mg by mouth daily.    . hydrOXYzine (VISTARIL) 25 MG capsule Take 25 mg by mouth every 6 (six) hours as needed for anxiety.     Marland Kitchen ipratropium (ATROVENT) 0.02 % nebulizer solution Take 0.5 mg by nebulization 3 (three) times daily.     Marland Kitchen ipratropium-albuterol (DUONEB) 0.5-2.5 (3)  MG/3ML SOLN Take 3 mLs by nebulization every 4 (four) hours as needed.     Marland Kitchen LORazepam (ATIVAN) 0.5 MG tablet Take 0.5 mg by mouth every 8 (eight) hours as needed for anxiety.    . meloxicam (MOBIC) 15 MG tablet Take 15 mg by mouth daily.    . metoprolol succinate (TOPROL-XL) 50 MG 24 hr tablet Take 50 mg by mouth daily. Take with or immediately following a meal.    . mirtazapine (REMERON) 30 MG tablet Take 45 mg by mouth at bedtime.    Marland Kitchen morphine (MSIR) 15 MG tablet Take 15 mg by mouth 2 (two) times daily.    Marland Kitchen omeprazole (PRILOSEC) 40 MG capsule Take 40 mg by mouth daily.    . risperiDONE (RISPERDAL) 2 MG tablet Take 2 mg by mouth at bedtime.     . sulfamethoxazole-trimethoprim (BACTRIM DS,SEPTRA DS) 800-160 MG tablet Take 1 tablet by mouth 2 (two) times daily.    Marland Kitchen valproic acid (DEPAKENE) 250 MG capsule Take 500 mg by mouth 2 (two) times daily.     . cefdinir (OMNICEF) 300 MG capsule Take 300 mg by mouth 2 (two) times daily.      Home: Home Living Family/patient expects to be discharged to:: Private residence Living Arrangements: Spouse/significant other Available Help at Discharge: Family, Available 24 hours/day Type of Home: House Home Access: Stairs to enter [employer] of Steps: 5 Entrance Stairs-Rails: Right Home Layout: One level Bathroom Shower/Tub: Walk-in shower Home Equipment: Environmental consultant - 2 wheels, Bedside commode, Shower seat, Wheelchair - manual  Functional History: Prior Function Level of Independence: Needs assistance Gait / Transfers Assistance Needed: Modified independent with rolling walker for household distances. Uses w/c if out Functional Status:  Mobility: Bed Mobility Overal bed mobility: Needs Assistance Bed Mobility: Supine to Sit Supine to sit: Min assist General bed mobility comments: Pt up in chair Transfers Overall transfer level: Needs assistance Equipment used: Ambulation equipment used Transfer via Lift  Equipment: Stedy Transfers: Sit to/from Stand, Pharmacologist Sit to Stand: Mod assist, +2 safety/equipment, Max assist Stand pivot transfers: Mod assist, +2 physical assistance General transfer comment: Heavy assist to bring hips up from low chair.  Used Stedy to pivot chair to bsc back to chair.  Ambulation/Gait General Gait Details: Pt too weak to amb    ADL:    Cognition: Cognition Overall Cognitive Status: Within Functional Limits for tasks assessed Orientation Level: Oriented X4 Cognition Arousal/Alertness: Awake/alert Behavior During Therapy: WFL for tasks assessed/performed Overall Cognitive Status: Within Functional Limits for tasks assessed  Blood pressure 110/63, pulse 65, temperature 97.7 F (36.5 C), temperature source Oral, resp. rate 15, height 5' (1.524 m), weight 79.7 kg (175 lb 11.3 oz), SpO2 90 %. Physical Exam  Vitals reviewed. Constitutional: She is oriented to person, place, and time.  HENT:  Head: Normocephalic.  Eyes: EOM are normal.  Neck: Normal range of motion. Neck supple. No thyromegaly present.  Cardiovascular: Normal rate and regular rhythm.  Exam reveals no friction rub.   No murmur heard. Respiratory:  Limited inspiratory effort with upper airway congestion. On oxygen via [place]. No shortness of breath with conversation  GI: Soft. Bowel  sounds are normal. She exhibits no distension. There is no tenderness.  Musculoskeletal: She exhibits edema.  Neurological: She is alert and oriented to person, place, and time.  UE: 4-deltoid, bicep, tricep and 4/5 wrist and HI. LE: 2+ to 3-HF, 3 KE and 4-/5 ADF/PF. No obvious sensory deficits. Cognitively intact  Skin: Skin is warm and dry.  Psychiatric: She has a normal mood and affect. Her behavior is normal.    Lab Results Last 24 Hours       Results for orders placed or performed during the hospital encounter of 08/26/16 (from the past 24 hour(s))  Glucose, capillary     Status: Abnormal    Collection Time: 09/06/16  4:52 PM  Result Value Ref Range   Glucose-Capillary 152 (H) 65 - 99 mg/dL  Glucose, capillary     Status: Abnormal   Collection Time: 09/06/16  8:31 PM  Result Value Ref Range   Glucose-Capillary 228 (H) 65 - 99 mg/dL   Comment 1 Notify RN   Glucose, capillary     Status: Abnormal   Collection Time: 09/06/16 10:36 PM  Result Value Ref Range   Glucose-Capillary 195 (H) 65 - 99 mg/dL   Comment 1 Notify RN   CBC     Status: Abnormal   Collection Time: 09/07/16  2:38 AM  Result Value Ref Range   WBC 9.4 4.0 - 10.5 K/uL   RBC 4.14 3.87 - 5.11 MIL/uL   Hemoglobin 10.8 (L) 12.0 - 15.0 g/dL   HCT 16.135.1 (L) 09.636.0 - 04.546.0 %   MCV 84.8 78.0 - 100.0 fL   MCH 26.1 26.0 - 34.0 pg   MCHC 30.8 30.0 - 36.0 g/dL   RDW 40.917.3 (H) 81.111.5 - 91.415.5 %   Platelets 210 150 - 400 K/uL  Basic metabolic panel     Status: Abnormal   Collection Time: 09/07/16  2:38 AM  Result Value Ref Range   Sodium 136 135 - 145 mmol/L   Potassium 4.3 3.5 - 5.1 mmol/L   Chloride 92 (L) 101 - 111 mmol/L   CO2 35 (H) 22 - 32 mmol/L   Glucose, Bld 152 (H) 65 - 99 mg/dL   BUN 15 6 - 20 mg/dL   Creatinine, Ser 7.820.39 (L) 0.44 - 1.00 mg/dL   Calcium 9.1 8.9 - 95.610.3 mg/dL   GFR calc non Af Amer >60 >60 mL/min   GFR calc Af Amer >60 >60 mL/min   Anion gap 9 5 - 15  Glucose, capillary     Status: Abnormal   Collection Time: 09/07/16  8:15 AM  Result Value Ref Range   Glucose-Capillary 107 (H) 65 - 99 mg/dL     Imaging Results (Last 48 hours)  No results found.    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 Does the need for close, 24 hr/day medical supervision in concert with the patient's rehab needs make it unreasonable for this patient to be served in a less intensive setting? Yes 2. Co-Morbidities requiring supervision/potential complications: COPD, chronic back pain, HTN 3. Due to bladder management, bowel  management, safety, skin/wound care, disease management, medication administration, pain management and patient education, does the patient require 24 hr/day rehab nursing? Yes 4. Does the patient require coordinated care of a physician, rehab nurse, PT (1-2 hrs/day, 5 days/week) and OT (1-2 hrs/day, 5 days/week) to address physical and functional deficits in the context of the above medical diagnosis(es)? Yes Addressing deficits in the following areas:  balance, endurance, locomotion, strength, transferring, bowel/bladder control, bathing, dressing, feeding, grooming, toileting and psychosocial support 5. Can the patient actively participate in an intensive therapy program of at least 3 hrs of therapy per day at least 5 days per week? Yes 6. The potential for patient to make measurable gains while on inpatient rehab is excellent 7. Anticipated functional outcomes upon discharge from inpatient rehab are modified independent  with PT, modified independent and supervision with OT, n/a with SLP. 8. Estimated rehab length of stay to reach the above functional goals is: 13-16 days 9. Does the patient have adequate social supports and living environment to accommodate these discharge functional goals? Yes 10. Anticipated D/C setting: Home 11. Anticipated post D/C treatments: HH therapy 12. Overall Rehab/Functional Prognosis: excellent  RECOMMENDATIONS: This patient's condition is appropriate for continued rehabilitative care in the following setting: CIR Patient has agreed to participate in recommended program. Yes Note that insurance prior authorization may be required for reimbursement for recommended care.  Comment: Mrs. Aundria RudRogers was independent within her home prior to arrival. She used a wheelchair only for longer distances in the community. Is very motivated and has a supportive husband. Rehab Admissions Coordinator to follow up.  Thanks,  Ranelle OysterZachary T. Swartz, MD, [facility]     09/07/2016      Revision History                        Routing History

## 2016-09-09 NOTE — Progress Notes (Signed)
Physical Therapy Treatment Patient Details Name: Cheryl NissenJonce Ann Wohler MRN: [id number] DOB: 06/29/1966 Today's Date: 09/09/2016    History of Present Illness [facility] with vent dependent respiratory failure. Pt intubated 11/17 and extubated 11/24. PMH - COPD, chronic granulomatous disease, hypertension, bipolar disorder, anxiety.     PT Comments    Pt making steady progress. Remains motivated to work with therapy to increase independence.  Follow Up Recommendations  CIR     Equipment Recommendations  None recommended by PT    Recommendations for Other Services Rehab consult     Precautions / Restrictions Precautions Precautions: Fall Restrictions Weight Bearing Restrictions: No    Mobility  Bed Mobility Overal bed mobility: Needs Assistance Bed Mobility: Supine to Sit     Supine to sit: Min assist     General bed mobility comments: Assist to elevate trunk into sitting  Transfers Overall transfer level: Needs assistance Equipment used: Rolling walker (2 wheeled) Transfers: Sit to/from UGI CorporationStand;Stand Pivot Transfers Sit to Stand: Mod assist;+2 safety/equipment Stand pivot transfers: Mod assist;+2 physical assistance (with Stedy)       General transfer comment: Assist to bring hips up and for balance. Used Stedy for bed to bsc to recliner before standing with walker from recliner. Verbal cues for hand placement when using walker. Pt stood x 4.   Ambulation/Gait Ambulation/Gait assistance: Mod assist;+2 physical assistance Ambulation Distance (Feet): 3 Feet Assistive device: Rolling walker (2 wheeled) Gait Pattern/deviations: Step-to pattern;Decreased step length - right;Decreased step length - left;Shuffle Gait velocity: decr Gait velocity interpretation: Below normal speed for age/gender General Gait Details: Assist for balance and support and to bring chair behind. Pt fatigues quickly and with dyspnea 3/4 on 5L of O2.    Stairs             Wheelchair Mobility    Modified Rankin (Stroke Patients Only)       Balance Overall balance assessment: Needs assistance Sitting-balance support: Feet supported;No upper extremity supported Sitting balance-Leahy Scale: Fair     Standing balance support: Bilateral upper extremity supported Standing balance-Leahy Scale: Poor Standing balance comment: walker and min A for static standing. Stood x 4 for 30-60 sec each time                    Cognition Arousal/Alertness: Awake/alert Behavior During Therapy: WFL for tasks assessed/performed Overall Cognitive Status: Within Functional Limits for tasks assessed                      Exercises      General Comments        Pertinent Vitals/Pain      Home Living                      Prior Function            PT Goals (current goals can now be found in the care plan section) Progress towards PT goals: Progressing toward goals    Frequency    Min 3X/week      PT Plan Current plan remains appropriate    Co-evaluation             End of Session Equipment Utilized During Treatment: Gait belt;Oxygen Activity Tolerance: Patient limited by fatigue Patient left: in chair;with call bell/phone within reach;with family/visitor present     Time: [id number] PT Time Calculation (min) (ACUTE ONLY): 30 min  Charges:  $Gait Training: 23-37 mins  G CodesAngelina Ok:      Brytni Dray W Maycok 09/09/2016, 12:48 PM [employer] CorporationCary Avonne Berkery PT [phone]

## 2016-09-09 NOTE — Progress Notes (Signed)
Patient ID: Cheryl Wheeler, female   DOB: 1966-06-02, 50 y.o.   MRN: [id number] Patient arrived from 4E via wheelchair with belongings and RN. Patient placed on 4L of O2 by nasal canula. Patient oriented to rehab process, fall prevention plan, rehab safety plan, health resource notebook, rehab schedule, and nurse call system with verbal understanding. Patient's O2 sats are 90% on 4L. Patient sitting up in bed with bed alarm on and call bell at side.

## 2016-09-09 NOTE — Discharge Summary (Signed)
Physician Discharge Summary  Cheryl Wheeler  ZOX:[id number]  DOB: [birth year]  DOA: 08/26/2016 PCP: Noni Saupe., MD  Admit date: 08/26/2016 Discharge date: 09/09/2016  Admitted From: Home  Disposition:  [facility] [place]    Recommendations for Outpatient Follow-up:  1. Follow up with PCP in 1-2 weeks 2. Please obtain BMP/CBC in one week 3. Ambulatory saturation study to evaluate the oxygen level 4. 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 On 08/22/2016, patient presented to [facility] with dyspnea, dry cough and subjective fever and hypoxia to 70's. Initial ABG was 7.4/66/45/41 at [facility]. CBC significant for mild leukocytosis. BMP remarkable for hypoosmolar hyponatremia to 128 otherwise within normal limits. UA was remarkable for pyuria but urine culture was negative. She was started on Solu-Medrol, DuoNeb, Pulmicort and guanfacine-DM for COPD exacerbation. However, she continued to have worsening dyspnea with increased oxygen requirement on BiPAP continuously. She was also started on cefepime and vancomycin on 08/24/2016. Given lack of improvement, patient was transferred to [facility] around 11/16. On 11/17, patient was intubated, she also underwent a CT angiogram of the chest which was negative for PE. She had a bronchoscopy on 11/18. Only positive micro was positive RSV on a respiratory virus panel. She eventually was able to be liberated from the ventilator and was extubated on 11/24, patient placed on  steroid taper, PCCM adjusted medications and she will be discharge to inpatient rehabilitation   SIGNIFICANT EVENTS: 11/12 -Admitted to [facility] where his acute on chronic COPD exacerbation and hypoosmolar hyponatremia 11/14 -Started on vancomycin and cefepime 11/16 -Transferred to [facility] ICU for possible tracheostomy due to continuous need for BiPAP and increased oxygen requirement.  11/17 -Intubated. CT chest negative for PE.  11/18 - Bronch RML, no DAH, some erthyema blood, BAL sent, tolerated 11/19 - versed drip required 11/24 - extubated  Subjective: Patient seen and examined this morning with nurse at bedside. Patient felt slight short of breath she reports she has some anxiety. She is eager to go to inpatient rehabilitation.   Discharge Diagnoses:  Acute on chronic hypoxic and hypercapnic respiratory failuremultifactorial due toadvanced COPD, OSA, OHS Progressedto vent-depending respiratory failure during this hospitalization, extubated 11/24 O2 4 L nasal cannula. Patient oxygen dependent normally at 2 L at home, goal to wean back to baseline, if possible, if no may need high flow O2 machine ? New baseline. Patient need outpatient sleep studies  BiPAP qHS Keep saturation between 88-92% Prednisone taper - may need it chronically Follow up with pulmonary doctor in 2 weeks   Acute pulmonary edema 2/2 acute on chronic diastolic CHF - somewhat improving  Continue Lasix 20mg  PO daily  On BB   Chronic granulomatous disease On Bactrim prophylaxis and follows with immunologist asoutpatient   DM type 2  Lantus 14 U, Novolog 5U before meals  HTN On labetalol, blood pressure well controlled  CAP - Viral pneumonia  CT of the chest with concern for pneumonia. RSV Bpositive.  Completed cource of abx   Bipolar / Anxiety Continue home medications   Chronic back pain Continue home medication  Deconditioning  For inpatient rehabilitation    Discharge  Instructions  Discharge Instructions    Call MD for:  difficulty breathing, headache or visual disturbances    Complete by:  As directed    Call MD for:  extreme fatigue    Complete by:  As directed    Call MD for:  hives    Complete by:  As directed    Call MD for:  persistant dizziness or light-headedness    Complete by:  As directed    Call MD for:  persistant nausea and vomiting    Complete by:  As directed    Call MD for:  redness, tenderness, or signs of infection (pain, swelling, redness, odor or green/yellow discharge around incision site)    Complete by:  As directed    Call MD for:  severe uncontrolled pain    Complete by:  As directed    Call MD for:  temperature >100.4    Complete by:  As directed    Diet - low sodium heart healthy    Complete by:  As directed    Increase activity slowly    Complete by:  As directed        Medication List    STOP taking these medications   budesonide 0.5 MG/2ML nebulizer solution Commonly known as:  PULMICORT   cefdinir 300 MG capsule Commonly known as:  OMNICEF   meloxicam 15 MG tablet Commonly known as:  MOBIC   metoprolol succinate 50 MG 24 hr tablet Commonly known as:  TOPROL-XL     TAKE these medications   acetaminophen 325 MG tablet Commonly known as:  TYLENOL Take 2 tablets (650 mg total) by mouth every 6 (six) hours as needed for mild pain or moderate pain.   albuterol 108 (90 Base) MCG/ACT inhaler Commonly known as:  PROVENTIL HFA;VENTOLIN HFA Inhale 2 puffs into the lungs every 4 (four) hours as needed for wheezing or shortness of breath.   atorvastatin 10 MG tablet Commonly known as:  LIPITOR Take 10 mg by mouth daily.   budesonide-formoterol 160-4.5 MCG/ACT inhaler Commonly known as:  SYMBICORT Inhale 2 puffs into the lungs 2 (two) times daily.   furosemide 20 MG tablet Commonly known as:  LASIX Take 20 mg by mouth daily.   guaiFENesin-dextromethorphan 100-10 MG/5ML syrup Commonly known as:   ROBITUSSIN DM Take 5 mLs by mouth every 4 (four) hours as needed for cough.   hydrOXYzine 25 MG capsule Commonly known as:  VISTARIL Take 25 mg by mouth every 6 (six) hours as needed for anxiety.   insulin glargine 100 UNIT/ML injection Commonly known as:  LANTUS Inject 0.14 mLs (14 Units total) into the skin daily. Start taking on:  09/10/2016   ipratropium 0.02 % nebulizer solution Commonly known as:  ATROVENT Take 0.5 mg by nebulization 3 (three) times daily.   ipratropium-albuterol 0.5-2.5 (3) MG/3ML Soln Commonly known as:  DUONEB Take 3 mLs by nebulization every 4 (four) hours as needed.   labetalol 300 MG tablet Commonly known as:  NORMODYNE Take 1 tablet (300 mg total) by mouth 3 (three) times daily.   LORazepam 0.5 MG tablet Commonly known as:  ATIVAN Take 0.5 mg by mouth every 8 (eight) hours as needed for anxiety.   mirtazapine 30 MG tablet Commonly known as:  REMERON Take 45 mg by mouth at bedtime.   morphine 15 MG tablet Commonly known as:  MSIR Take 15 mg by mouth 2 (two) times daily.   omeprazole  40 MG capsule Commonly known as:  PRILOSEC Take 40 mg by mouth daily.   predniSONE 10 MG (21) Tbpk tablet Commonly known as:  STERAPRED UNI-PAK 21 TAB Take 1 tablet (10 mg total) by mouth taper from 4 doses each day to 1 dose and stop.   risperiDONE 2 MG tablet Commonly known as:  RISPERDAL Take 2 mg by mouth at bedtime.   sulfamethoxazole-trimethoprim 800-160 MG tablet Commonly known as:  BACTRIM DS,SEPTRA DS Take 1 tablet by mouth 2 (two) times daily.   valproic acid 250 MG capsule Commonly known as:  DEPAKENE Take 2 capsules (500 mg total) by mouth 2 (two) times daily.       Allergies  Allergen Reactions  . Fentanyl Shortness Of Breath  . Meperidine Other (See Comments)    chest pain-"feels like squeezing heart"  . Azithromycin Other (See Comments)    VRE  . Codeine Nausea Only  . Levofloxacin Other (See Comments)    Unknown  . Other Other  (See Comments)    BLACK STICHES=BODY REJECTS THEM  . Penicillins Rash  . Sulfa Antibiotics Rash    Tolerates Bactrim    Consultations:  PCCM   Physical therapy and rehabilitation    Procedures/Studies: Ct Angio Chest Pe W Or Wo Contrast  Result Date: 08/27/2016 CLINICAL DATA:  Increasing dyspnea, evaluate for PE EXAM: CT ANGIOGRAPHY CHEST WITH CONTRAST TECHNIQUE: Multidetector CT imaging of the chest was performed using the standard protocol during bolus administration of intravenous contrast. Multiplanar CT image reconstructions and MIPs were obtained to evaluate the vascular anatomy. CONTRAST:  100 mL Isovue 300 IV COMPARISON:  CT chest dated 06/22/2016 FINDINGS: Cardiovascular: Satisfactory opacification of the bilateral pulmonary arteries to the segmental level. No evidence of pulmonary embolism. No evidence of thoracic aortic aneurysm or dissection. Very mild atherosclerotic calcifications of the aortic root and arch. The heart is normal size.  No pericardial effusion. Mediastinum/Nodes: No suspicious mediastinal lymphadenopathy. Visualized thyroid is grossly unremarkable. Lungs/Pleura: Evaluation of the lung parenchyma is constrained by respiratory motion. Endotracheal tube terminates 2.0 cm above the carina. Mild patchy/ground-glass opacities in the lateral right middle lobe (series 6/ image 71), suspicious for pneumonia. Patchy/platelike opacity in the left upper lobe/ lingula (series 6/ image 81), similar to the prior, favored to reflect scarring. Mild linear scarring/atelectasis in the medial right upper lobe. Calcified granulomata in the left upper lobe (series 6/ image 82) and left lower lobe (series 6/image 93), benign. No suspicious pulmonary nodules. Underlying mild emphysematous changes. No pleural effusion or pneumothorax. Upper Abdomen: Visualized upper abdomen is notable for an enteric tube coursing into the distal gastric body. Musculoskeletal: Degenerative changes of the  visualized thoracolumbar spine. Review of the MIP images confirms the above findings. IMPRESSION: No evidence of pulmonary embolism. Mild patchy/ground-glass opacities in the lateral right middle lobe, suspicious for pneumonia. Endotracheal tube terminates 2.0 cm above the carina. Additional ancillary findings as above. Electronically Signed   By: Charline Bills M.D.   On: 08/27/2016 15:06   Dg Chest Port 1 View  Result Date: 09/09/2016 CLINICAL DATA:  Shortness of breath, hypertension, COPD, GERD, diabetes mellitus EXAM: PORTABLE CHEST 1 VIEW COMPARISON:  Portable exam 0816 hours compared 09/03/2016 FINDINGS: Interval removal of endotracheal and nasogastric tubes. Normal heart size mediastinal contours. Bronchitic changes with hazy LEFT upper lobe infiltrate suspicious for pneumonia. Question minimal RIGHT upper lobe infiltrate as well. Remaining lungs grossly clear. No pleural effusion or pneumothorax. Bones unremarkable. IMPRESSION: Bronchitic changes with probable mild BILATERAL upper  lobe infiltrates question pneumonia. Electronically Signed   By: Ulyses SouthwardMark  Boles M.D.   On: 09/09/2016 08:28   Dg Chest Port 1 View  Result Date: 09/03/2016 CLINICAL DATA:  Intubation. EXAM: PORTABLE CHEST 1 VIEW COMPARISON:  09/02/2016. FINDINGS: Endotracheal tube tip 2 cm above the carina. NG tube tip noted below left hemidiaphragm. Mild left lower lobe infiltrate. Calcified right upper lobe pulmonary nodule noted consistent granuloma. No pleural effusion or pneumothorax. IMPRESSION: 1. Endotracheal tube tip 2 cm above the carina. NG tube tip below left hemidiaphragm. 2. Mild left lower lobe infiltrate. Electronically Signed   By: Maisie Fushomas  Register   On: 09/03/2016 06:50   Dg Chest Port 1 View  Result Date: 09/02/2016 CLINICAL DATA:  Ventilator dependence. EXAM: PORTABLE CHEST 1 VIEW COMPARISON:  09/01/2016 and 08/27/2016 FINDINGS: Endotracheal tube has tip 3.6 cm above the carina. Nasogastric tube courses into the  region of the stomach and off the inferior portion of the film as tip is not visualized. Lungs are adequately inflated mild stable prominence of the vascular markings in the left infrahilar region. No focal lobar consolidation, effusion or pneumothorax. Small stable calcified granulomas right lung. Cardiomediastinal silhouette and remainder of the exam is unchanged. IMPRESSION: Mild stable prominence of the left infrahilar vascular markings. Tubes and lines as described. Electronically Signed   By: Elberta Fortisaniel  Boyle M.D.   On: 09/02/2016 07:10   Dg Chest Port 1 View  Result Date: 09/01/2016 CLINICAL DATA:  Hypoxia EXAM: PORTABLE CHEST 1 VIEW COMPARISON:  August 31, 2016 FINDINGS: Endotracheal tube tip is 2.4 cm above the carina. Nasogastric tube tip and side port are below the diaphragm. No pneumothorax. There is generalized interstitial thickening. No airspace consolidation. Heart is upper normal in size with mild pulmonary venous hypertension. No adenopathy. No bone lesions. IMPRESSION: Tube positions as described without pneumothorax. There is a degree of pulmonary vascular congestion. Interstitial prominence may reflect mild underlying pulmonary edema but also may be reflective of chronic inflammatory type change. No airspace consolidation or adenopathy. Electronically Signed   By: Bretta BangWilliam  Woodruff III M.D.   On: 09/01/2016 09:40   Dg Chest Port 1 View  Result Date: 08/31/2016 CLINICAL DATA:  50 year old female with a history of acute respiratory failure and shortness of breath EXAM: PORTABLE CHEST 1 VIEW COMPARISON:  08/30/2016, 08/28/2016, CT chest 08/27/2016 FINDINGS: Cardiomediastinal silhouette unchanged in size and contour. No evidence of central vascular congestion. Similar appearance of coarsened interstitial markings with mild reticular pattern opacity bilaterally. No confluent airspace disease or pneumothorax. Unchanged position of the endotracheal tube which terminates approximately 3.2 cm  above the carina. Unchanged gastric tube terminating out of the field of view. IMPRESSION: Similar appearance of the chest x-ray with coarsened interstitial markings, potentially representing atelectasis/scarring and/ or persisting airspace disease which was present on the prior CT. Unchanged endotracheal tube and gastric tube Signed, Yvone NeuJaime S. Loreta AveWagner, DO [facility] Electronically Signed   By: Gilmer MorJaime  Wagner D.O.   On: 08/31/2016 07:33   Dg Chest Port 1 View  Result Date: 08/30/2016 CLINICAL DATA:  COPD, pneumonia, acute respiratory failure. EXAM: PORTABLE CHEST 1 VIEW COMPARISON:  Portable chest x-ray of August 28, 2016 FINDINGS: The lungs are hyperinflated. The interstitial markings are coarse in the left lower lung field. The heart is normal in size. The pulmonary vascularity is not engorged. The mediastinum is normal in width. The endotracheal tube tip lies 2.7 cm above the carina. The esophagogastric tube tip projects below the inferior  margin of the image. IMPRESSION: COPD. Left lower lobe subsegmental atelectasis or mild pneumonia. No CHF. No significant change since yesterday's study. Electronically Signed   By: David  [place] M.D.   On: 08/30/2016 07:11   Dg Chest Port 1 View  Result Date: 08/28/2016 CLINICAL DATA:  Acute respiratory failure EXAM: PORTABLE CHEST 1 VIEW COMPARISON:  08/27/2016 FINDINGS: Endotracheal tube and NG tube remain in place, unchanged. There is hyperinflation of the lungs compatible with COPD. Heart borderline in size. Improving left basilar opacity. Right lung is clear. No effusions. IMPRESSION: COPD.  Improving left base atelectasis or infiltrate. Electronically Signed   By: Charlett Nose M.D.   On: 08/28/2016 07:26   Portable Chest Xray  Result Date: 08/27/2016 CLINICAL DATA:  Respiratory failure, intubated patient; history of COPD EXAM: PORTABLE CHEST 1 VIEW COMPARISON:  Portable chest x-ray of August 27, 2016 12:11 a.m. FINDINGS: The patient is rotated toward the right on this study. The lungs are well-expanded. There is persistent infiltrate or atelectasis at the left lung base. There is no pleural effusion or pneumothorax. The heart and pulmonary vascularity are normal. The endotracheal tube tip lies 2.5 cm above the carina. The esophagogastric tube tip projects below the inferior margin of the image. The observed bony thorax is unremarkable. IMPRESSION: COPD. Left lower lobe atelectasis or pneumonia. The support tubes are in stable position. Electronically Signed   By: David  [place] M.D.   On: 08/27/2016 09:36   Portable Chest Xray  Result Date: 08/27/2016 CLINICAL DATA:  Respiratory failure EXAM: PORTABLE CHEST 1 VIEW COMPARISON:  08/26/2016 FINDINGS: Endotracheal tube tip is 2.2 cm above the carina. Nasogastric tube extends into the stomach and beyond the inferior edge of the image. The lungs are clear. No pneumothorax. No large effusion. Normal pulmonary vasculature. Unremarkable hilar and mediastinal contours. IMPRESSION: 1.  Support equipment appears satisfactorily positioned. 2. No consolidation or effusion.  No pneumothorax. Electronically Signed   By: Ellery Plunk M.D.   On: 08/27/2016 00:19   Dg Chest Port 1 View  Result Date: 08/26/2016 CLINICAL DATA:  Dyspnea. EXAM: PORTABLE CHEST 1 VIEW COMPARISON:  08/25/2016. FINDINGS: Normal sized heart. Stable linear scar in the right upper lung zone and left perihilar region. Otherwise, clear lungs with normal vascularity. No pleural fluid. Unremarkable bones. IMPRESSION: No acute abnormality. Electronically Signed   By: Beckie Salts M.D.   On: 08/26/2016 18:44   Dg Abd Portable 1v  Result Date: 08/27/2016 CLINICAL DATA:  Orogastric tube placement EXAM: PORTABLE ABDOMEN - 1 VIEW COMPARISON:  07/21/2007 FINDINGS: The orogastric tube extends well into the stomach with tip in the region of the distal gastric body. IMPRESSION: Enteric tube reaches  well into the stomach. Electronically Signed   By: Ellery Plunk M.D.   On: 08/27/2016 00:21    (Echo, Carotid, EGD, Colonoscopy, ERCP)    Discharge Exam: Vitals:   09/09/16 0800 09/09/16 0814  BP: 126/68 123/86  Pulse: 93 89  Resp: (!) 24 20  Temp: 98 F (36.7 C) 98.1 F (36.7 C)   Vitals:   09/09/16 0814 09/09/16 0825 09/09/16 0827 09/09/16 1150  BP: 123/86     Pulse: 89     Resp: 20     Temp: 98.1 F (36.7 C)     TempSrc: Oral     SpO2: 92% 92% 94% 94%  Weight:      Height:        General: Pt is alert, awake, not in acute distress, on [place]  4L Cardiovascular: RRR, S1/S2 +, no rubs, no gallops Respiratory: Non labored breathing, Diminished breath sound bibasilar, mild expiratory wheezing, no rhonchi Abdominal: Soft, NT, ND, bowel sounds + Extremities: trace edema, no cyanosis, strength 4/5 in all extremities    The results of significant diagnostics from this hospitalization (including imaging, microbiology, ancillary and laboratory) are listed below for reference.     Microbiology: Recent Results (from the past 240 hour(s))  Culture, Urine     Status: Abnormal   Collection Time: 09/01/16  8:51 AM  Result Value Ref Range Status   Specimen Description URINE, CATHETERIZED  Final   Special Requests NONE  Final   Culture 10,000 COLONIES/mL YEAST (A)  Final   Report Status 09/02/2016 FINAL  Final     Labs: BNP (last 3 results)  Recent Labs  08/26/16 1804  BNP 168.8*   Basic Metabolic Panel:  Recent Labs Lab 09/03/16 0214 09/04/16 0309 09/05/16 0312 09/06/16 0212 09/07/16 0238 09/08/16 0245 09/09/16 0317  NA 137 135 136 135 136 136 137  K 4.4 4.4 3.5 3.4* 4.3 4.3 4.0  CL 91* 91* 88* 89* 92* 93* 92*  CO2 36* 35* 37* 34* 35* 33* 34*  GLUCOSE 165* 107* 110* 162* 152* 117* 137*  BUN 26* 22* 19 17 15 14 13   CREATININE 0.34* 0.33* 0.42* 0.42* 0.39* 0.50 0.35*  CALCIUM 9.4 9.6 9.5 9.2 9.1 9.3 9.1  MG 2.2 2.3 2.1 2.0  --   --   --   PHOS 4.3 4.4 3.9  3.3  --   --   --    Liver Function Tests:  Recent Labs Lab 09/06/16 0212  AST 15  ALT 37  ALKPHOS 34*  BILITOT 0.3  PROT 6.0*  ALBUMIN 3.4*   No results for input(s): LIPASE, AMYLASE in the last 168 hours. No results for input(s): AMMONIA in the last 168 hours. CBC:  Recent Labs Lab 09/05/16 0312 09/06/16 0212 09/07/16 0238 09/08/16 0245 09/09/16 0317  WBC 12.9* 11.0* 9.4 10.3 8.8  HGB 11.2* 10.9* 10.8* 11.1* 10.5*  HCT 36.6 35.5* 35.1* 36.2 35.0*  MCV 83.8 84.5 84.8 85.8 86.2  PLT 236 207 210 197 181   Cardiac Enzymes: No results for input(s): CKTOTAL, CKMB, CKMBINDEX, TROPONINI in the last 168 hours. BNP: Invalid input(s): POCBNP CBG:  Recent Labs Lab 09/08/16 1745 09/08/16 2120 09/09/16 0819 09/09/16 0924 09/09/16 1226  GLUCAP 136* 140* 96 102* 138*   D-Dimer No results for input(s): DDIMER in the last 72 hours. Hgb A1c No results for input(s): HGBA1C in the last 72 hours. Lipid Profile No results for input(s): CHOL, HDL, LDLCALC, TRIG, CHOLHDL, LDLDIRECT in the last 72 hours. Thyroid function studies No results for input(s): TSH, T4TOTAL, T3FREE, THYROIDAB in the last 72 hours.  Invalid input(s): FREET3 Anemia work up No results for input(s): VITAMINB12, FOLATE, FERRITIN, TIBC, IRON, RETICCTPCT in the last 72 hours. Urinalysis    Component Value Date/Time   COLORURINE YELLOW 08/31/2016 1646   APPEARANCEUR HAZY (A) 08/31/2016 1646   LABSPEC 1.025 08/31/2016 1646   PHURINE 7.5 08/31/2016 1646   GLUCOSEU NEGATIVE 08/31/2016 1646   HGBUR NEGATIVE 08/31/2016 1646   BILIRUBINUR NEGATIVE 08/31/2016 1646   KETONESUR NEGATIVE 08/31/2016 1646   PROTEINUR NEGATIVE 08/31/2016 1646   NITRITE NEGATIVE 08/31/2016 1646   LEUKOCYTESUR MODERATE (A) 08/31/2016 1646   Sepsis Labs Invalid input(s): PROCALCITONIN,  WBC,  LACTICIDVEN Microbiology Recent Results (from the past 240 hour(s))  Culture, Urine     Status: Abnormal  Collection Time: 09/01/16   8:51 AM  Result Value Ref Range Status   Specimen Description URINE, CATHETERIZED  Final   Special Requests NONE  Final   Culture 10,000 COLONIES/mL YEAST (A)  Final   Report Status 09/02/2016 FINAL  Final    Time coordinating discharge: Over 30 minutes  SIGNED:  Latrelle DodrillEdwin Silva, MD  [facility] 09/09/2016, 1:29 PM Pager   If 7PM-7AM, please contact night-coverage [email or web address] Password [email or web address]

## 2016-09-09 NOTE — Progress Notes (Addendum)
Inpatient Rehabilitation  I am continuing to follow along for timing of medical readiness, insurance authorization, and bed availability.  I hope to hear from insurance today and plan to update the team as I know.  Please call with questions.    Addendum: I have insurance authorization and a bed to offer; however, I await acute MD clearance.   Charlane FerrettiMelissa Aman Batley, M.A., CCC/SLP Admission Coordinator  [facility]  Cell [phone]

## 2016-09-10 ENCOUNTER — Inpatient Hospital Stay (HOSPITAL_COMMUNITY): Payer: Medicare HMO | Admitting: Physical Therapy

## 2016-09-10 ENCOUNTER — Inpatient Hospital Stay (HOSPITAL_COMMUNITY): Payer: Medicare HMO | Admitting: Occupational Therapy

## 2016-09-10 DIAGNOSIS — R32 Unspecified urinary incontinence: Secondary | ICD-10-CM

## 2016-09-10 DIAGNOSIS — I10 Essential (primary) hypertension: Secondary | ICD-10-CM

## 2016-09-10 DIAGNOSIS — E8809 Other disorders of plasma-protein metabolism, not elsewhere classified: Secondary | ICD-10-CM

## 2016-09-10 DIAGNOSIS — D62 Acute posthemorrhagic anemia: Secondary | ICD-10-CM

## 2016-09-10 DIAGNOSIS — E46 Unspecified protein-calorie malnutrition: Secondary | ICD-10-CM

## 2016-09-10 DIAGNOSIS — F319 Bipolar disorder, unspecified: Secondary | ICD-10-CM

## 2016-09-10 DIAGNOSIS — I5033 Acute on chronic diastolic (congestive) heart failure: Secondary | ICD-10-CM

## 2016-09-10 DIAGNOSIS — D71 Functional disorders of polymorphonuclear neutrophils: Secondary | ICD-10-CM

## 2016-09-10 LAB — COMPREHENSIVE METABOLIC PANEL
ALBUMIN: 2.9 g/dL — AB (ref 3.5–5.0)
ALT: 26 U/L (ref 14–54)
AST: 14 U/L — AB (ref 15–41)
Alkaline Phosphatase: 37 U/L — ABNORMAL LOW (ref 38–126)
Anion gap: 12 (ref 5–15)
BUN: 14 mg/dL (ref 6–20)
CHLORIDE: 88 mmol/L — AB (ref 101–111)
CO2: 35 mmol/L — ABNORMAL HIGH (ref 22–32)
Calcium: 9.1 mg/dL (ref 8.9–10.3)
Creatinine, Ser: 0.5 mg/dL (ref 0.44–1.00)
GFR calc Af Amer: >60 mL/min (ref >60)
GFR calc non Af Amer: >60 mL/min (ref >60)
GLUCOSE: 121 mg/dL — AB (ref 65–99)
POTASSIUM: 3.8 mmol/L (ref 3.5–5.1)
Sodium: 135 mmol/L (ref 135–145)
Total Bilirubin: 0.6 mg/dL (ref 0.3–1.2)
Total Protein: 5.8 g/dL — ABNORMAL LOW (ref 6.5–8.1)

## 2016-09-10 LAB — CBC WITH DIFFERENTIAL/PLATELET
BASOS ABS: 0 10*3/uL (ref 0.0–0.1)
BASOS PCT: 0 %
EOS PCT: 0 %
Eosinophils Absolute: 0 10*3/uL (ref 0.0–0.7)
HCT: 34.8 % — ABNORMAL LOW (ref 36.0–46.0)
Hemoglobin: 10.5 g/dL — ABNORMAL LOW (ref 12.0–15.0)
Lymphocytes Relative: 7 %
Lymphs Abs: 0.7 10*3/uL (ref 0.7–4.0)
MCH: 26.3 pg (ref 26.0–34.0)
MCHC: 30.2 g/dL (ref 30.0–36.0)
MCV: 87.2 fL (ref 78.0–100.0)
MONO ABS: 0.5 10*3/uL (ref 0.1–1.0)
Monocytes Relative: 5 %
NEUTROS ABS: 8.7 10*3/uL — AB (ref 1.7–7.7)
Neutrophils Relative %: 88 %
PLATELETS: 157 10*3/uL (ref 150–400)
RBC: 3.99 MIL/uL (ref 3.87–5.11)
RDW: 19 % — AB (ref 11.5–15.5)
WBC: 9.9 10*3/uL (ref 4.0–10.5)

## 2016-09-10 LAB — GLUCOSE, CAPILLARY
GLUCOSE-CAPILLARY: 102 mg/dL — AB (ref 65–99)
GLUCOSE-CAPILLARY: 84 mg/dL (ref 65–99)
Glucose-Capillary: 155 mg/dL — ABNORMAL HIGH (ref 65–99)
Glucose-Capillary: 109 mg/dL — ABNORMAL HIGH (ref 65–99)

## 2016-09-10 MED ORDER — PRO-STAT SUGAR FREE PO LIQD
30.0000 mL | Freq: Two times a day (BID) | ORAL | Status: DC
  Administered 2016-09-10 – 2016-09-17 (×15): 30 mL via ORAL
  Filled 2016-09-10 (×16): qty 30

## 2016-09-10 MED ORDER — HYDROXYZINE HCL 25 MG PO TABS
25.0000 mg | ORAL_TABLET | Freq: Four times a day (QID) | ORAL | Status: DC | PRN
  Administered 2016-09-10 – 2016-09-17 (×17): 25 mg via ORAL
  Filled 2016-09-10 (×17): qty 1

## 2016-09-10 NOTE — Care Management Note (Signed)
Inpatient Rehabilitation Center Individual Statement of Services  Patient Name:  Cheryl NissenJonce Ann Wheeler  Date:  09/10/2016  Welcome to the Inpatient Rehabilitation Center.  Our goal is to provide you with an individualized program based on your diagnosis and situation, designed to meet your specific needs.  With this comprehensive rehabilitation program, you will be expected to participate in at least 3 hours of rehabilitation therapies Monday-Friday, with modified therapy programming on the weekends.  Your rehabilitation program will include the following services:  Physical Therapy (PT), Occupational Therapy (OT), Speech Therapy (ST), 24 hour per day rehabilitation nursing, Therapeutic Recreaction (TR), Neuropsychology, Case Management (Social Worker), Rehabilitation Medicine, Nutrition Services and Pharmacy Services  Weekly team conferences will be held on Wednesdays to discuss your progress.  Your Social Worker will talk with you frequently to get your input and to update you on team discussions.  Team conferences with you and your family in attendance may also be held.  Expected length of stay: 3 weeks  Overall anticipated outcome: supervision  Depending on your progress and recovery, your program may change. Your Social Worker will coordinate services and will keep you informed of any changes. Your Social Worker's name and contact numbers are listed  below.  The following services may also be recommended but are not provided by the Inpatient Rehabilitation Center:   Driving Evaluations  Home Health Rehabiltiation Services  Outpatient Rehabilitation Services    Arrangements will be made to provide these services after discharge if needed.  Arrangements include referral to agencies that provide these services.  Your insurance has been verified to be:  [employer] Medicare Your primary doctor is:  Dr. Jeanie Seweredding  Pertinent information will be shared with your doctor and your insurance  company.  Social Worker:  [place]cy Jaelyne Deeg, [place] [phone] or [phone]) [phone]   Information discussed with and copy given to patient by: Amada JupiterHOYLE, Cheila Wickstrom, 09/10/2016, 4:24 PM

## 2016-09-10 NOTE — Evaluation (Signed)
Physical Therapy Assessment and Plan  Patient Details  Name: Cheryl Wheeler MRN: [id number] Date of Birth: 1966-10-08  PT Diagnosis: Abnormality of gait, Difficulty walking, Impaired sensation, Low back pain and Muscle weakness Rehab Potential: Good ELOS: 2.5-3 weeks   Today's Date: 09/10/2016 PT Individual Time: 0962-8366 AND 2947-6546 PT Individual Time Calculation (min): 60 min AND 72 min    Problem List:  Patient Active Problem List   Diagnosis Date Noted  . Bipolar affective disorder ([place])   . Essential hypertension   . Acute on chronic diastolic heart failure ([place])   . Urinary incontinence   . Acute blood loss anemia   . Hypoalbuminemia due to protein-calorie malnutrition ([place])   . 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 COPD exacerbation ([place]) 09/06/2016  . Granulomatous disease ([place]) 09/06/2016  . Diastolic heart failure ([place]) 09/06/2016  . DM (diabetes mellitus) ([place]) 09/06/2016  . HTN (hypertension) 09/06/2016  . Bipolar disorder ([place]) 09/06/2016  . Anxiety 09/06/2016  . Chronic back pain 09/06/2016  . OSA (obstructive sleep apnea) 09/06/2016  . Obesity hypoventilation syndrome ([place]) 09/06/2016  . Acute pulmonary edema ([facility])   . CAP (community acquired pneumonia)   . Dyspnea   . Acute respiratory failure with hypoxia ([place]) 08/26/2016    Past Medical History:  Past Medical History:  Diagnosis Date  . Anxiety   . Bipolar disorder ([place])   . COPD (chronic obstructive pulmonary disease) ([place])   . GERD (gastroesophageal reflux disease)   . Hypertension    Past Surgical History: History reviewed. No pertinent surgical history.  Assessment & Plan Clinical Impression: Patient is a 50 y.o.right handed femalewith history of advanced COPD on 51-4L of oxygen as well as BiPAPat home, chronic granulomatous disease and maintained on Bactrim for prophylaxis followed by immunologist as outpatient, chronic diastolic congestive heart failure, diabetes mellitus,  hypertension, bipolar disorder, chronic back pain maintained on MSIR 15 mg twice a day. Per chart review patient lives with spouse. One level homewith 5 steps to entry. Modified independent with rolling walker for household distances prior to admission. Uses a wheelchair outside of the home. Presented to [facility] 08/22/2016 for acute on chronic respiratory failure with low-grade fever and hypoxic in the 70s and required intubation. Findings of mild hyponatremia 128. Started on Solu-Medrol and nebulizer treatments. Transferred to [facility] 08/26/2016 for ongoing medical care. Chest x-ray was negative for acute abnormality. CT angiogram of the chest negative for pulmonary embolibut concerning for pneumonia. RSV-B positive. Completed a course of broad-spectrum antibiotics.Pulmonary care service follow-up remain intubated through 11/51/2017Prednisone Dosepak taper as directed.  Patient transferred to CIR on 11/51/2017 .   Patient currently requires max with mobility secondary to muscle weakness, decreased cardiorespiratoy endurance and decreased oxygen support and decreased sitting balance, decreased standing balance, decreased postural control and decreased balance strategies.  Prior to hospitalization, patient was modified independent  with mobility and lived with Spouse in a Mobile home home.  Home access is 5Stairs to enter.  Patient will benefit from skilled PT intervention to maximize safe functional mobility, minimize fall risk and decrease caregiver burden for planned discharge home with 24 hour supervision.  Anticipate patient will benefit from follow up [place] at discharge.  PT - End of Session Activity Tolerance: Decreased this session Endurance Deficit: Yes Endurance Deficit Description: very poor oxygen support/endurance PT Assessment Rehab Potential (ACUTE/IP ONLY): Good PT Patient demonstrates impairments in the following area(s): Balance;Endurance;Motor;Pain PT Transfers  Functional Problem(s): Bed Mobility;Bed to Chair;Car;Furniture PT Locomotion Functional Problem(s):  Ambulation;Wheelchair Mobility;Stairs PT Plan PT Intensity: Minimum of 1-2 x/day ,45 to 90 minutes PT Frequency: 5 out of 7 days PT Duration Estimated Length of Stay: 2.5-51 weeks PT Treatment/Interventions: Ambulation/gait training;Balance/vestibular training;Discharge planning;Disease management/prevention;DME/adaptive equipment instruction;Functional mobility training;Neuromuscular re-education;Pain management;Patient/family education;Psychosocial support;Splinting/orthotics;Stair training;Therapeutic Activities;Therapeutic Exercise;UE/LE Strength taining/ROM;Wheelchair propulsion/positioning PT Transfers Anticipated Outcome(s): supervision PT Locomotion Anticipated Outcome(s): supervision PT Recommendation Recommendations for Other Services: Neuropsych consult Follow Up Recommendations: Home health PT;24 hour supervision/assistance Patient destination: Home Equipment Recommended: None recommended by PT Equipment Details: has RW and w/c at home-using PTA  Skilled Therapeutic Intervention AM session: pt received in bed; pt participated in PT evaluation with focus on assessment of LE sensation, strength, bed mobility, sit <> stand from bed, transfers bed > w/c squat pivot, w/c mobility, and attempt to perform ambulation with UE support on RW as documented below.  Once standing with RW with max A to place cushion and solid seat insert in w/c pt unable to maintain upright trunk or advance either LE.  Pt returned to room and discussed goals of therapy, impairments to address and ELOS.  Pt verbalized agreement.  Pt tolerated session well on 6L 02 with multiple rest breaks for deep breathing.  Pt left up in w/c with all items within reach.  PM session: pt received still up in w/c; pt reporting fatigue but willing to participate.  Pt transported to gym and performed sit > stand and gait training in //  bars with pt performing gait forwards and backwards x 3' as documented below with mod A.  Attempted to perform one low step negotiation-pt able to tap each foot to step but unable to push through either leg to advance to next step even with max A.  Educated pt on use of Stedy for transfers and demonstrated transfer.  Pt performed sit <> stand and transfer w/c <> mat with Stedy and max A.  Performed sit > supine on mat with mod A and HOB elevated on wedge and multiple pillows.  Pt engaged in postural exercises with focus on stretching/opening chest through shoulder presses and decreasing use of scalenes and upper traps with active shoulder depression and extension.  Returned to w/c and transferred w/c > bed with Stedy and max A.  Required mod A for sit > supine in bed and for repositioning in bed.  Pt left with all items within reach.  PT Evaluation Precautions/Restrictions Precautions Precautions: Fall Precaution Comments: 5L 02 via nasal cannula  Restrictions Weight Bearing Restrictions: No General Chart Reviewed: Yes Response to Previous Treatment: Patient reporting fatigue but able to participate. Family/Caregiver Present: No Vital SignsTherapy Vitals Temp: 97.9 F (36.6 C) Temp Source: Oral Pulse Rate: (!) 103 Resp: 19 BP: 133/64 Patient Position (if appropriate): Lying Oxygen Therapy SpO2: 95 % O2 Device: Nasal Cannula O2 Flow Rate (L/min): 5 L/min Pain Pain Assessment Pain Assessment: 0-10 Pain Score: 6  Pain Type: Chronic pain Pain Location: Back Pain Orientation: Lower Pain Descriptors / Indicators: Aching;Constant Pain Frequency: Constant Pain Onset: On-going Patients Stated Pain Goal: 3 Pain Intervention(s): Medication (See eMAR) Home Living/Prior Functioning Home Living Living Arrangements: Spouse/significant other Available Help at Discharge: Family;Available 24 hours/day Type of Home: Mobile home Home Access: Stairs to enter Entrance Stairs-Number of Steps:  5 Entrance Stairs-Rails: Left Home Layout: One level Bathroom Shower/Tub: Multimedia programmer: Standard Bathroom Accessibility: Yes  Lives With: Spouse Prior Function Level of Independence: Independent with gait;Independent with transfers;Requires assistive device for independence;Independent with homemaking with ambulation  Able to Take  Stairs?: Yes Leisure: Hobbies-yes (Comment) Cognition Overall Cognitive Status: Within Functional Limits for tasks assessed Arousal/Alertness: Awake/alert Attention: Sustained Sustained Attention: Appears intact Memory: Appears intact Awareness: Appears intact Problem Solving: Appears intact Safety/Judgment: Appears intact Sensation Sensation Light Touch: Impaired by gross assessment Stereognosis: Not tested Hot/Cold: Appears Intact Proprioception: Appears Intact Additional Comments: 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 Coordination Gross Motor Movements are Fluid and Coordinated: Not tested Fine Motor Movements are Fluid and Coordinated: Not tested Finger Nose Finger Test: Slight overshooting with R UE Motor  Motor Motor: Abnormal postural alignment and control;Other (comment) Motor - Skilled Clinical Observations: generalized weakness  Mobility Bed Mobility Bed Mobility: Supine to Sit;Sit to Supine Supine to Sit: 4: Min assist Sit to Supine: 3: Mod assist Transfers Transfers: Yes Sit to Stand: 2: Max assist Sit to Stand Details: Verbal cues for technique;Verbal cues for precautions/safety Stand to Sit: 2: Max assist Stand to Sit Details (indicate cue type and reason): Verbal cues for precautions/safety;Verbal cues for technique Squat Pivot Transfers: 2: Max assist;With armrests Transfer via Lift Equipment: Medical sales representative Ambulation: Yes Ambulation/Gait Assistance: 3: Mod assist Ambulation Distance (Feet): 3 Feet Assistive device: Parallel bars Gait Gait: Yes Gait Pattern: Impaired Gait Pattern: Step-to  pattern;Decreased step length - right;Decreased step length - left;Decreased stride length;Decreased hip/knee flexion - right;Decreased hip/knee flexion - left;Decreased dorsiflexion - right;Decreased dorsiflexion - left;Shuffle;Trunk flexed;Poor foot clearance - left;Poor foot clearance - right Stairs / Additional Locomotion Stairs: No Architect: Yes Wheelchair Assistance: 4: Min Lexicographer: Both upper extremities Wheelchair Parts Management: Needs assistance Distance: 50  Trunk/Postural Assessment  Cervical Assessment Cervical Assessment: Exceptions to [facility] (forward head) Thoracic Assessment Thoracic Assessment: Exceptions to WFL (slight kyphosis) Lumbar Assessment Lumbar Assessment: Exceptions to [facility] (posterior pelvic tilt) Postural Control Postural Control: Deficits on evaluation (impaired balance reactions)  Balance Balance Balance Assessed: Yes Static Sitting Balance Static Sitting - Balance Support: Right upper extremity supported;Left upper extremity supported Static Sitting - Level of Assistance: 5: Stand by assistance Dynamic Sitting Balance Dynamic Sitting - Balance Support: Right upper extremity supported;Left upper extremity supported Dynamic Sitting - Level of Assistance: 4: Min Insurance risk surveyor Standing - Balance Support: Right upper extremity supported;Left upper extremity supported Static Standing - Level of Assistance: 3: Mod assist Dynamic Standing Balance Dynamic Standing - Balance Support: Right upper extremity supported;Left upper extremity supported Dynamic Standing - Level of Assistance: 3: Mod assist Extremity Assessment  RUE Assessment RUE Assessment: Exceptions to [facility] (3-/5 MMT grossly) LUE Assessment LUE Assessment: Exceptions to [facility] (3-/5 MMT grossly) RLE Assessment RLE Assessment: Exceptions to [facility] RLE Strength RLE Overall Strength: Deficits RLE Overall Strength Comments: 3/5  overall LLE Assessment LLE Assessment: Exceptions to [facility] LLE Strength LLE Overall Strength: Deficits LLE Overall Strength Comments: 2/5 overall   See Function Navigator for Current Functional Status.   Refer to Care Plan for Long Term Goals  Recommendations for other services: Neuropsych  Discharge Criteria: Patient will be discharged from PT if patient refuses treatment 3 consecutive times without medical reason, if treatment goals not met, if there is a change in medical status, if patient makes no progress towards goals or if patient is discharged from hospital.  The above assessment, treatment plan, treatment alternatives and goals were discussed and mutually agreed upon: by patient  Malachy Mood 09/10/2016, 4:27 PM

## 2016-09-10 NOTE — Progress Notes (Signed)
Social Work  Social Work Assessment and Plan  Patient Details  Name: Cheryl Wheeler MRN: [id number] Date of Birth: 1966/01/08  Today's Date: 09/10/2016  Problem List:  Patient Active Problem List   Diagnosis Date Noted  . Bipolar affective disorder (HCC)   . Essential hypertension   . Acute on chronic diastolic heart failure (HCC)   . Urinary incontinence   . Acute blood loss anemia   . Hypoalbuminemia due to protein-calorie malnutrition (HCC)   . 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 COPD exacerbation (HCC) 09/06/2016  . Granulomatous disease (HCC) 09/06/2016  . Diastolic heart failure (HCC) 09/06/2016  . DM (diabetes mellitus) (HCC) 09/06/2016  . HTN (hypertension) 09/06/2016  . Bipolar disorder (HCC) 09/06/2016  . Anxiety 09/06/2016  . Chronic back pain 09/06/2016  . OSA (obstructive sleep apnea) 09/06/2016  . Obesity hypoventilation syndrome (HCC) 09/06/2016  . Acute pulmonary edema (HCC)   . CAP (community acquired pneumonia)   . Dyspnea   . Acute respiratory failure with hypoxia (HCC) 08/26/2016   Past Medical History:  Past Medical History:  Diagnosis Date  . Anxiety   . Bipolar disorder (HCC)   . COPD (chronic obstructive pulmonary disease) (HCC)   . GERD (gastroesophageal reflux disease)   . Hypertension    Past Surgical History: History reviewed. No pertinent surgical history. Social History:  reports that she quit smoking about 3 months ago. Her smoking use included Cigarettes. She has never used smokeless tobacco. She reports that she does not drink alcohol or use drugs.  Family / Support Systems Marital Status: Married How Long?: 6 yrs Patient Roles: Spouse, Parent Spouse/Significant Other: husband, Cheryl Wheeler @ [phone]) [phone] Children: pt has a son but limited contact Anticipated Caregiver: Spouse  Ability/Limitations of Caregiver: None - pt reports he is also on disability, however, independent and able to assist her. Caregiver Availability:  24/7 Family Dynamics: Pt describes spouse as very supportive and denies any concerns about support at home.  Social History Preferred language: English Religion: Baptist Cultural Background: NA Education: HS Read: Yes Write: Yes Employment Status: Disabled Date Retired/Disabled/Unemployed: since age 50 Legal Hisotry/Current Legal Issues: None Guardian/Conservator: None - per MD, pt is capable of making decisions on her own behalf   Abuse/Neglect Physical Abuse: Denies Verbal Abuse: Denies Sexual Abuse: Denies Exploitation of patient/patient's resources: Denies Self-Neglect: Denies  Emotional Status Pt's affect, behavior adn adjustment status: Pt lying in bed and admittedly fatigued from first day of therapies.  She is pleasant and able to complete assessment easily.  Notes she is optimistic about CIR.  Denies any current emotional distress - will monitor and refer for neuropsychology as indicated. Recent Psychosocial Issues: None Pyschiatric History: Bipolar - [facility] [place] [facility] 3 mos for med management.   Substance Abuse History: None  Patient / Family Perceptions, Expectations & Goals Pt/Family understanding of illness & functional limitations: Pt and family with good understanding of her medical issues, debility and need for CIR. Premorbid pt/family roles/activities: pt independent PTA, however, not out of the home much except for MD appointments Anticipated changes in roles/activities/participation: little change anticipated if able to reach supervision goals. Pt/family expectations/goals: Pt has goals for supervision as this is close to baseline.  Community [employer] Community Agencies: None Premorbid Home Care/DME Agencies: Other (Comment) [facility] Transportation available at discharge: yes Resource referrals recommended: Neuropsychology  Discharge Planning Living Arrangements: Spouse/significant other Support Systems: Spouse/significant  other Type of Residence: Private residence Insurance Resources: [employer] EntertainmentMedicare Administrator([employer] Medicare) Surveyor, quantityinancial  Resources: SSD Financial Screen Referred: No Living Expenses: Rent Money Management: Spouse Does the patient have any problems obtaining your medications?: Yes (Describe) Home Management: mostly spouse Patient/Family Preliminary Plans: Pt to return home with spouse able to provide 24/7 assistance. Social Work Anticipated Follow Up Needs: HH/OP Expected length of stay: 3 weeks  Clinical Impression Very pleasant woman here for debility following resp failure.  Chronic pulmonary issues and O2 dependent at home with husband providing any needed assistance.  She is optimistic about therapies and rebuilding her strength.  Denies any emotional distress.  Note h/o bipolar dz, however, this is managed ongoing as an outpatient.  Will follow for support and d/c planning needs.  Cheryl Wheeler 09/10/2016, 4:34 PM

## 2016-09-10 NOTE — Progress Notes (Signed)
Patient information reviewed and entered into eRehab system by Annastacia Duba, RN, CRRN, PPS Coordinator.  Information including medical coding and functional independence measure will be reviewed and updated through discharge.     Per nursing patient was given "Data Collection Information Summary for Patients in Inpatient Rehabilitation Facilities with attached "Privacy Act Statement-Health Care Records" upon admission.  

## 2016-09-10 NOTE — Progress Notes (Signed)
[facility]IT[facility]     PROGRESS NOTE  Subjective/Complaints:  Pt seen sitting up in bed this AM.  She slept well overnight.  She is ready to being her day of therapies.    ROS: Denies CP, SOB, N/V/D.  Objective: Vital Signs: Blood pressure 111/61, pulse 93, temperature 98.6 F (37 C), temperature source Oral, resp. rate 20, height 4' 11.5" (1.511 m), weight 80.6 kg (177 lb 11.2 oz), SpO2 96 %. Dg Chest Port 1 View  Result Date: 09/09/2016 CLINICAL DATA:  Shortness of breath, hypertension, COPD, GERD, diabetes mellitus EXAM: PORTABLE CHEST 1 VIEW COMPARISON:  Portable exam 0816 hours compared 09/03/2016 FINDINGS: Interval removal of endotracheal and nasogastric tubes. Normal heart size mediastinal contours. Bronchitic changes with hazy LEFT upper lobe infiltrate suspicious for pneumonia. Question minimal RIGHT upper lobe infiltrate as well. Remaining lungs grossly clear. No pleural effusion or pneumothorax. Bones unremarkable. IMPRESSION: Bronchitic changes with probable mild BILATERAL upper lobe infiltrates question pneumonia. Electronically Signed   By: Ulyses SouthwardMark  Boles M.D.   On: 09/09/2016 08:28    Recent Labs  09/09/16 0317 09/10/16 0446  WBC 8.8 9.9  HGB 10.5* 10.5*  HCT 35.0* 34.8*  PLT 181 157    Recent Labs  09/09/16 0317 09/10/16 0446  NA 137 135  K 4.0 3.8  CL 92* 88*  GLUCOSE 137* 121*  BUN 13 14  CREATININE 0.35* 0.50  CALCIUM 9.1 9.1   CBG (last 3)   Recent Labs  09/09/16 1647 09/09/16 2050 09/10/16 0649  GLUCAP 84 116* 102*    Wt Readings from Last 3 Encounters:  09/10/16 80.6 kg (177 lb 11.2 oz)  09/09/16 81.8 kg (180 lb 5.4 oz)    Physical Exam:  BP 111/61 (BP Location: Right Arm)   Pulse 93   Temp 98.6 F (37 C) (Oral)   Resp 20   Ht 4' 11.5" (1.511 m)   Wt 80.6 kg (177 lb 11.2 oz)   SpO2 96%   BMI 35.29 kg/m  Constitutional: She appears well-developed. NAD.  HENT: Normocephalic. Atraumatic. Eyes: EOMare  normal. No discharge.  Cardiovascular: Normal rateand normal heart sounds. No JVD. Respiratory: Effort normal. No respiratory distress.  + Wheezes + Hart GI: Soft. Bowel sounds are normal.   Neurological: She is alertand oriented.  Motor: B/l UE: 4-/5 deltoid, 4/5 bicep, tricep and 4/5 wrist and HI.  B/l LE: 3/5 HF, 3/5 KE and 4-/5 ADF/PF.  Skin: Skin is warmand dry.  Psychiatric: She has a normal mood and affect. Her behavior is normal  Assessment/Plan: 1. 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 Physiatrist is providing close team supervision and 24 hour management of active medical problems listed below. Physiatrist and rehab team continue to assess barriers to discharge/monitor patient progress toward functional and medical goals.  Function:  Bathing Bathing position      Bathing parts      Bathing assist        Upper Body Dressing/Undressing Upper body dressing                    Upper body assist        Lower Body Dressing/Undressing Lower body dressing  Lower body assist        Toileting Toileting          Toileting assist     Transfers Chair/bed transfer             Locomotion Ambulation           Wheelchair          Cognition Comprehension Comprehension assist level: Follows basic conversation/direction with no assist  Expression Expression assist level: Expresses basic needs/ideas: With no assist  Social Interaction Social Interaction assist level: Interacts appropriately with others with medication or extra time (anti-anxiety, antidepressant).  Problem Solving Problem solving assist level: Solves basic problems with no assist  Memory      Medical Problem List and Plan: 1. 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 Oxygen dependent prior to admission with BiPAP at home  Begin CIR 2. DVT Prophylaxis/Anticoagulation: SCDs.   Vascular studies pending 3. Pain Management/chronic back pain:   MSIR 15 mg twice a day 4. Mood/bipolar disorder: Remeron 30 mg daily, Risperdal 2 mg twice a day, Depakene 500 mg twice a day 5. Neuropsych: This patient iscapable of making decisions on herown behalf. 6. Skin/Wound Care: Routine skin checks 7. Fluids/Electrolytes/Nutrition: Routine I&Os 8.Chronic granulomatous disease.   Continue Bactrim prophylaxis 9.Diabetes mellitus and peripheral neuropathy. Hemoglobin A1c 6.5. Lantus insulin 14 units daily, NovoLog 5 units 3 times a day. Check blood sugars before meals and at bedtime  Relatively controlled 12/1 10.Hypertension. Labetalol 300 mg 3 times a day, . Monitor with increased mobility  Controlled on 12/1 11.Acute on chronic diastolic congestive heart failure. Lasix 40 mg daily. Monitor for any signs of fluid overload   Filed Weights   09/09/16 1811 09/10/16 0456  Weight: 77.1 kg (170 lb) 80.6 kg (177 lb 11.2 oz)   12.Hyperlipidemia. Lipitor 13. Urinary incontinence.   UA, C&S pending 14. ABLA  Hb 10.5 on 12/1  Cont to monitor 15. Hypoalbuminemia  Supplement initiated 12/1  LOS (Days) 1 A FACE TO FACE EVALUATION WAS PERFORMED  Kalan Yeley Karis Jubanil Nivan Melendrez 09/10/2016 9:28 AM

## 2016-09-10 NOTE — Evaluation (Signed)
Occupational Therapy Assessment and Plan  Patient Details  Name: Cheryl Wheeler MRN: [id number] Date of Birth: [birth year]  OT Diagnosis: muscle weakness (generalized) Rehab Potential: Rehab Potential (ACUTE ONLY): Good ELOS: 3 weeks   Today's Date: 09/10/2016 OT Individual Time:  - 7124-5809 Individual Treatment Time Calculation: 64 minutes       Problem List: Patient Active Problem List   Diagnosis Date Noted  . Bipolar affective disorder ([place])   . Essential hypertension   . Acute on chronic diastolic heart failure ([place])   . Urinary incontinence   . Acute blood loss anemia   . Hypoalbuminemia due to protein-calorie malnutrition ([place])   . 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 COPD exacerbation ([place]) 09/06/2016  . Granulomatous disease ([place]) 09/06/2016  . Diastolic heart failure ([place]) 09/06/2016  . DM (diabetes mellitus) ([place]) 09/06/2016  . HTN (hypertension) 09/06/2016  . Bipolar disorder ([place]) 09/06/2016  . Anxiety 09/06/2016  . Chronic back pain 09/06/2016  . OSA (obstructive sleep apnea) 09/06/2016  . Obesity hypoventilation syndrome ([place]) 09/06/2016  . Acute pulmonary edema ([facility])   . CAP (community acquired pneumonia)   . Dyspnea   . Acute respiratory failure with hypoxia ([place]) 08/26/2016    Past Medical History:  Past Medical History:  Diagnosis Date  . Anxiety   . Bipolar disorder ([place])   . COPD (chronic obstructive pulmonary disease) ([place])   . GERD (gastroesophageal reflux disease)   . Hypertension    Past Surgical History: History reviewed. No pertinent surgical history.  Assessment & Plan Clinical Impression: Cheryl Wampole Rogersis a 50 y.o.right handed femalewith history of advanced COPD on 2-4L of oxygen as well as BiPAPat home, chronic granulomatous disease and maintained on Bactrim for prophylaxis followed by immunologist as outpatient, chronic diastolic congestive heart failure, diabetes mellitus, hypertension, bipolar disorder, chronic back pain  maintained on MSIR 15 mg twice a day. Per chart review patient lives with spouse. One level homewith 5 steps to entry. Modified independent with rolling walker for household distances prior to admission. Uses a wheelchair outside of the home. [facility]ed [facility]2016 for acute on chronic respiratory failure with low-grade fever and hypoxic in the 70s and required intubation. Findings of mild hyponatremia 128. Started on Solu-Medrol and nebulizer treatments. Transferred to [facility] 08/26/2016 for ongoing medical care. Chest x-ray was negative for acute abnormality. CT angiogram of the chest negative for pulmonary embolibut concerning for pneumonia. RSV-B positive. Completed a course of broad-spectrum antibiotics.Pulmonary care service follow-up remain intubated through 09/03/2016.Prednisone Dosepak taper as directed. Physicaland occupationaltherapy evaluationscompleted 09/07/2016 with recommendations of physical medicine rehabilitation consult.Patient was admitted for a comprehensive rehabilitation program   Patient currently requires max with basic self-care skills secondary to muscle weakness, decreased cardiorespiratoy endurance and decreased standing balance.  Prior to hospitalization, patient could complete BADLs with min.  Patient will benefit from skilled intervention to increase independence with basic self-care skills prior to discharge home with care partner.  Anticipate patient will require intermittent supervision and follow up home health.  OT - End of Session Endurance Deficit: Yes Endurance Deficit Description: very poor oxygen support/endurance OT Assessment Rehab Potential (ACUTE ONLY): Good Barriers to Discharge:  (N/A) OT Patient demonstrates impairments in the following area(s): Balance;Safety;Endurance;Motor OT Basic ADL's Functional Problem(s): Grooming;Bathing;Dressing;Toileting OT Transfers Functional Problem(s): Toilet;Tub/Shower OT  Additional Impairment(s): Fuctional Use of Upper Extremity OT Plan OT Intensity: Minimum of 1-2 x/day, 45 to 90 minutes OT Frequency: 5 out of 7 days OT Duration/Estimated Length of Stay: 3  weeks OT Treatment/Interventions: Discharge planning;Self Care/advanced ADL retraining;Therapeutic Activities;UE/LE Coordination activities;Functional mobility training;Therapeutic Exercise;Psychosocial support;UE/LE Strength taining/ROM OT Self Feeding Anticipated Outcome(s): N/A OT Basic Self-Care Anticipated Outcome(s): Min A-Mod I  OT Toileting Anticipated Outcome(s): Min A OT Bathroom Transfers Anticipated Outcome(s): Supervision  OT Recommendation Recommendations for Other Services: Neuropsych consult Patient destination: Home Follow Up Recommendations: Home health OT Equipment Recommended: To be determined   Skilled Therapeutic Intervention Skilled OT session completed with focus on initial evaluation, ADL retraining, safety education, and activity tolerance. Pt was in w/c with spouse Cheryl Wheeler at time of arrival. 02 sats on 5L 94%. Pt and spouse educated on role of OT, OT POC and CIR with verbalized understanding. Pt agreeable to complete ADLs w/c level at sink with setup. Pt required overall Max A for LB self care with instruction on PLB and energy conservation techniques. Max A for sit<stand with instruction on hand placement. Oral care completed w/c level with supervision for R UE coordination deficits. Squat pivot transfer to drop arm commode completed with Max A and max cues for technique. Safety plan updated and CNA notified regarding pts transfer status. 02 sats <95% for duration of ADL. At end of session pt was left in w/c with all needs within reach and Cheryl Wheeler present.   OT Evaluation Precautions/Restrictions  Precautions Precautions: Fall Precaution Comments: 5L 02 via nasal cannula  Restrictions Weight Bearing Restrictions: No General Chart Reviewed: Yes Family/Caregiver Present:  Yes Vital Signs Therapy Vitals Temp: 97.9 F (36.6 C) Temp Source: Oral Pulse Rate: (!) 103 Resp: 19 BP: 133/64 Patient Position (if appropriate): Lying Oxygen Therapy SpO2: 95 % O2 Device: Nasal Cannula O2 Flow Rate (L/min): 5 L/min Pain Pain Assessment Pain Assessment: 0-10 Pain Score: 6  Pain Type: Chronic pain Pain Location: Back Pain Orientation: Lower Pain Descriptors / Indicators: Aching;Constant Pain Frequency: Constant Pain Onset: On-going Patients Stated Pain Goal: 3 Pain Intervention(s): Medication (See eMAR) Home Living/Prior Functioning Home Living Family/patient expects to be discharged to:: Private residence Living Arrangements: Spouse/significant other Available Help at Discharge: Family, Available 24 hours/day Type of Home: Mobile home Home Access: Stairs to enter Technical brewer of Steps: 5 Entrance Stairs-Rails: Left Home Layout: One level Bathroom Shower/Tub: Multimedia programmer: Associate Professor Accessibility: Yes  Lives With: Spouse IADL History Homemaking Responsibilities: No Occupation: Retired 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 Prior Function Level of Independence: Independent with gait, Independent with transfers, Requires assistive device for independence, Independent with homemaking with ambulation  Able to Take Stairs?: Yes Leisure: Hobbies-yes (Comment) ADL ADL ADL Comments: Please see functional navigator for ADL status Vision/Perception  Vision- History Baseline Vision/History: Wears glasses Patient Visual Report: No change from baseline Vision- Assessment Vision Assessment?: No apparent visual deficits  Cognition Overall Cognitive Status: Within Functional Limits for tasks assessed Arousal/Alertness: Awake/alert Orientation Level: Person;Place;Situation Person: Oriented Place: Oriented Situation: Oriented Year: 2017 Month: December Day of Week: Correct Memory: Appears intact Immediate  Memory Recall: Sock;Blue;Bed Memory Recall: Sock;Blue;Bed Memory Recall Sock: Without Cue Memory Recall Blue: Without Cue Memory Recall Bed: Without Cue Attention: Sustained Sustained Attention: Appears intact Awareness: Appears intact Problem Solving: Appears intact Safety/Judgment: Appears intact Sensation Sensation Light Touch: Impaired by gross assessment Stereognosis: Not tested Hot/Cold: Appears Intact Proprioception: Appears Intact Additional Comments: 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 Coordination Gross Motor Movements are Fluid and Coordinated: Not tested Fine Motor Movements are Fluid and Coordinated: Not tested Finger Nose Finger Test: Slight overshooting with R UE Motor  Motor Motor: Abnormal postural alignment and  control;Other (comment) Motor - Skilled Clinical Observations: generalized weakness Mobility  Bed Mobility Bed Mobility: Supine to Sit;Sit to Supine Supine to Sit: 4: Min assist Sit to Supine: 3: Mod assist Transfers Transfers: Sit to Stand;Stand to Sit Sit to Stand: 2: Max assist Sit to Stand Details: Verbal cues for technique;Verbal cues for precautions/safety Stand to Sit: 2: Max assist Stand to Sit Details (indicate cue type and reason): Verbal cues for precautions/safety;Verbal cues for technique  Trunk/Postural Assessment  Cervical Assessment Cervical Assessment: Exceptions to [facility] (forward head) Thoracic Assessment Thoracic Assessment: Exceptions to [facility] (slight kyphosis) Lumbar Assessment Lumbar Assessment: Exceptions to [facility] (posterior pelvic tilt) Postural Control Postural Control: Deficits on evaluation (impaired balance reactions)  Balance Balance Balance Assessed: Yes Static Sitting Balance Static Sitting - Balance Support: Right upper extremity supported;Left upper extremity supported Static Sitting - Level of Assistance: 5: Stand by assistance Dynamic Sitting Balance Dynamic Sitting - Balance Support: Right upper extremity  supported;Left upper extremity supported Dynamic Sitting - Level of Assistance: 4: Min assist Static Standing Balance Static Standing - Balance Support: Right upper extremity supported;Left upper extremity supported Static Standing - Level of Assistance: 3: Mod assist Dynamic Standing Balance Dynamic Standing - Balance Support: Right upper extremity supported;Left upper extremity supported Dynamic Standing - Level of Assistance: 3: Mod assist Extremity/Trunk Assessment RUE Assessment RUE Assessment: Exceptions to [facility] (3-/5 MMT grossly) LUE Assessment LUE Assessment: Exceptions to [facility] (3-/5 MMT grossly)   See Function Navigator for Current Functional Status.   Refer to Care Plan for Long Term Goals  Recommendations for other services: Neuropsych  Discharge Criteria: Patient will be discharged from OT if patient refuses treatment 3 consecutive times without medical reason, if treatment goals not met, if there is a change in medical status, if patient makes no progress towards goals or if patient is discharged from hospital.  The above assessment, treatment plan, treatment alternatives and goals were discussed and mutually agreed upon: by patient and by family  Skeet Simmer 09/10/2016, 4:33 PM

## 2016-09-11 LAB — GLUCOSE, CAPILLARY
GLUCOSE-CAPILLARY: 159 mg/dL — AB (ref 65–99)
Glucose-Capillary: 110 mg/dL — ABNORMAL HIGH (ref 65–99)
Glucose-Capillary: 123 mg/dL — ABNORMAL HIGH (ref 65–99)

## 2016-09-11 NOTE — Progress Notes (Signed)
Cheryl Wheeler is a 50 y.o. female 09/13/1966 [id number]  Subjective: No new complaints. No new problems. SOB. Coughing.  Objective: Vital signs in last 24 hours: Temp:  [97.9 F (36.6 C)-98 F (36.7 C)] 98 F (36.7 C) (12/02 0517) Pulse Rate:  [79-103] 95 (12/02 0517) Resp:  [18-19] 18 (12/02 0517) BP: (119-133)/(47-64) 120/58 (12/02 0517) SpO2:  [92 %-98 %] 92 % (12/02 0843) Weight:  [177 lb 3.2 oz (80.4 kg)] 177 lb 3.2 oz (80.4 kg) (12/02 0517) Weight change: 7 lb 3.2 oz (3.266 kg) Last BM Date: 09/09/16  Intake/Output from previous day: 12/01 0701 - 12/02 0700 In: 720 [P.O.:720] Out: -  Last cbgs: CBG (last 3)   Recent Labs  09/10/16 1628 09/10/16 2038 09/11/16 0652  GLUCAP 155* 109* 110*     Physical Exam General: No apparent distress  On O2 HEENT: not dry Lungs: Normal effort. Lungs w/coarse BS B Cardiovascular: Regular rate and rhythm, no edema Abdomen: S/NT/ND; BS(+) Musculoskeletal:  unchanged Neurological: No new neurological deficits Wounds: N/A    Skin: clear   Mental state: Alert, oriented, cooperative    Lab Results: BMET    Component Value Date/Time   NA 135 09/10/2016 0446   K 3.8 09/10/2016 0446   CL 88 (L) 09/10/2016 0446   CO2 35 (H) 09/10/2016 0446   GLUCOSE 121 (H) 09/10/2016 0446   BUN 14 09/10/2016 0446   CREATININE 0.50 09/10/2016 0446   CALCIUM 9.1 09/10/2016 0446   GFRNONAA >60 09/10/2016 0446   GFRAA >60 09/10/2016 0446   CBC    Component Value Date/Time   WBC 9.9 09/10/2016 0446   RBC 3.99 09/10/2016 0446   HGB 10.5 (L) 09/10/2016 0446   HCT 34.8 (L) 09/10/2016 0446   PLT 157 09/10/2016 0446   MCV 87.2 09/10/2016 0446   MCH 26.3 09/10/2016 0446   MCHC 30.2 09/10/2016 0446   RDW 19.0 (H) 09/10/2016 0446   LYMPHSABS 0.7 09/10/2016 0446   MONOABS 0.5 09/10/2016 0446   EOSABS 0.0 09/10/2016 0446   BASOSABS 0.0 09/10/2016 0446    Studies/Results: No results found.  Medications: I have reviewed the patient's  current medications.  Assessment/Plan:               1. COPD exacerbation. On O2+meds 2. Criticall illness myopathy - in PT 3. HTN Labetalol tid 4. DM2 - Lantus and Novolog sq 5. Depression. Remeron, Risperdal 6. Chronic pain: MSIR prn  Length of stay, days: 2  Sonda PrimesAlex Plotnikov , MD 09/11/2016, 10:03 AM

## 2016-09-11 NOTE — Progress Notes (Signed)
Placed patient on BIPAP for the night with IPAP set at 10cm and EPAP set at 5cm. Oxygen set at 5lpm

## 2016-09-12 ENCOUNTER — Inpatient Hospital Stay (HOSPITAL_COMMUNITY): Payer: Medicare HMO | Admitting: Physical Therapy

## 2016-09-12 ENCOUNTER — Inpatient Hospital Stay (HOSPITAL_COMMUNITY): Payer: Medicare HMO | Admitting: Occupational Therapy

## 2016-09-12 LAB — GLUCOSE, CAPILLARY
GLUCOSE-CAPILLARY: 105 mg/dL — AB (ref 65–99)
GLUCOSE-CAPILLARY: 125 mg/dL — AB (ref 65–99)
Glucose-Capillary: 124 mg/dL — ABNORMAL HIGH (ref 65–99)
Glucose-Capillary: 77 mg/dL (ref 65–99)

## 2016-09-12 NOTE — Progress Notes (Signed)
Placed patient on BIPAP for the night with IPAP set at 14cm and EPAP set at 6cm. Oxygen set at 5lpm

## 2016-09-12 NOTE — Progress Notes (Signed)
Occupational Therapy Session Note  Patient Details  Name: Cheryl Wheeler MRN: [id number] Date of Birth: 18-Sep-1966  Today's Date: 09/12/2016 OT Individual Time: [id number] and [id number] OT Individual Time Calculation (min): 61 min and 48 min  Short Term Goals: Week 1:  OT Short Term Goal 1 (Week 1): Pt will complete bathing with Min A and AE PRN OT Short Term Goal 2 (Week 1): Pt will complete LB dressing with Mod A  OT Short Term Goal 3 (Week 1): Pt will stand for 3 minutes while engaged in unilaterally involved task to increase independence with LB self care OT Short Term Goal 4 (Week 1): Pt will complete BSC transfer and LRAD with Mod A  Skilled Therapeutic Interventions/Progress Updates:   Pt was lying in supine in bed at time of arrival, was agreeable to complete ADLs at sink (declined shower). Pt did c/o SOB with nursing made aware and RT coming to provide breathing treatment. 02 sats while in bed 90% on 5L. Squat pivot from bed to w/c completed with Max A and max cues for instruction on technique. 02 sats post transfer 87%. Pt bumped up to 5.5L and 02 sats increased to 90%. UB bathing initiated w/c level at sink when RT arrived. She increased pt to 6L and 02 sats increased to 94% after that and with short breathing tx. Pt educated on PLB throughout. Pt reported that she often has "bad days" at home where breathing is labored and on those days she spends time in her chair. Pt encouraged to engage in light therapy on "bad days" (when vitals are stable) here at CIR to improve strength and endurance for discharge home. Open Pt-therapist discussion completed regarding pts anxieties/fears of activity engagement due to physical deficits. Pt provided encouragement and therapeutic listening. She agreed to complete dressing and pericare for remainder of session. Still reports incontinent episodes with change of soiled brief during tx.Pt exhibits forward leaning posture when standing due to reliance on  sink for support. Grooming/oral care completed with supervision w/c level. At end of tx pt was left in w/c with all needs within reach. Stable vitals <94% when frequently monitored during tx.   2nd Session 1:1 Tx (48 min) Pt participated in skilled OT session focusing on R UE NMR, psychosocial wellness, and activity tolerance. Pt was received after lunch, sitting up in w/c. 02 sats on 5L 94%. Pt requesting to participate in meaningful leisure occupation of coloring to improve affect and increase feelings of comfort. Pt self propelled from room to dayroom with extra time and Min A for avoiding environmental obstacles. She completed coloring activity demonstrating weak R UE tripod grasp, but functional for activity demands. Pt completed task without rest breaks and fully engaged. Therapeutic use of music used to enhance psychosocial wellness and minimize pain (which pt reports is chronic, declined having OT check on pain meds). At end of session, pt was taken back to room for time and left with all needs within reach. 02 sats 94% on 5L at time of departure.   Therapy Documentation Precautions:  Precautions Precautions: Fall Precaution Comments: 5L 02 via nasal cannula  Restrictions Weight Bearing Restrictions: No General:   Vital Signs: Therapy Vitals Resp: 20 Oxygen Therapy SpO2: 95 % O2 Device: Nasal Cannula O2 Flow Rate (L/min): 5 L/min Pain: Pain addressed during sessions as written above. Pain Assessment Pain Assessment: 0-10 Pain Score: 5  Pain Type: Acute pain Pain Location: Back Pain Orientation: Lower Pain Radiating Towards: 1 Pain Descriptors /  Indicators: Aching Pain Frequency: Constant Pain Onset: On-going Patients Stated Pain Goal: 3 Pain Intervention(s): Medication (See eMAR) ADL: ADL ADL Comments: Please see functional navigator for ADL status    See Function Navigator for Current Functional Status.   Therapy/Group: Individual Therapy  Joselle Deeds A  Paiton Fosco 09/12/2016, 1:22 PM

## 2016-09-12 NOTE — Progress Notes (Signed)
Cheryl Wheeler is a 50 y.o. female 1966-02-14 [id number]  Subjective: No new complaints.  SOB w/any exertion. O2 5-6 l/min.   Objective: Vital signs in last 24 hours: Temp:  [98.4 F (36.9 C)-98.9 F (37.2 C)] 98.4 F (36.9 C) (12/03 0500) Pulse Rate:  [94-102] 102 (12/03 0500) Resp:  [16-28] 16 (12/03 0500) BP: (128-156)/(54-62) 156/62 (12/03 0500) SpO2:  [90 %-100 %] 90 % (12/03 0821) FiO2 (%):  [1 %] 1 % (12/02 2133) Weight:  [179 lb 14.3 oz (81.6 kg)] 179 lb 14.3 oz (81.6 kg) (12/03 0500) Weight change: 2 lb 11.1 oz (1.223 kg) Last BM Date: 09/11/16  Intake/Output from previous day: 12/02 0701 - 12/03 0700 In: 840 [P.O.:840] Out: -  Last cbgs: CBG (last 3)   Recent Labs  09/11/16 1138 09/11/16 2041 09/12/16 0638  GLUCAP 123* 159* 124*     Physical Exam General: No apparent distress at rest. Appears chronically ill HEENT: not dry Lungs: Normal effort. Lungs w/decr BS at bases B, no crackles or wheezes. Cardiovascular: Regular rate and rhythm, no edema Abdomen: S/NT/ND; BS(+) Musculoskeletal:  unchanged Neurological: No new neurological deficits Wounds: N/A    Skin: clear  Mental state: Alert, oriented, cooperative.    Lab Results: BMET    Component Value Date/Time   NA 135 09/10/2016 0446   K 3.8 09/10/2016 0446   CL 88 (L) 09/10/2016 0446   CO2 35 (H) 09/10/2016 0446   GLUCOSE 121 (H) 09/10/2016 0446   BUN 14 09/10/2016 0446   CREATININE 0.50 09/10/2016 0446   CALCIUM 9.1 09/10/2016 0446   GFRNONAA >60 09/10/2016 0446   GFRAA >60 09/10/2016 0446   CBC    Component Value Date/Time   WBC 9.9 09/10/2016 0446   RBC 3.99 09/10/2016 0446   HGB 10.5 (L) 09/10/2016 0446   HCT 34.8 (L) 09/10/2016 0446   PLT 157 09/10/2016 0446   MCV 87.2 09/10/2016 0446   MCH 26.3 09/10/2016 0446   MCHC 30.2 09/10/2016 0446   RDW 19.0 (H) 09/10/2016 0446   LYMPHSABS 0.7 09/10/2016 0446   MONOABS 0.5 09/10/2016 0446   EOSABS 0.0 09/10/2016 0446   BASOSABS  0.0 09/10/2016 0446    Studies/Results: No results found.  Medications: I have reviewed the patient's current medications.  Assessment/Plan:   1. COPD - severe. On O2. SOB w/any exertion. O2 5-6 l/min. 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 HTN - cont Labetalol 4. DM2 - cont Lantus and Novolog 5. Depreession - Remeron and Risperdal    Length of stay, days: 3  Sonda PrimesAlex Shaketa Serafin , MD 09/12/2016, 8:43 AM

## 2016-09-12 NOTE — Progress Notes (Signed)
Physical Therapy Session Note  Patient Details  Name: Cheryl Wheeler MRN: [id number] Date of Birth: Jul 12, 1966  Today's Date: 09/12/2016 PT Individual Time: 0907-1000 and 1447-1516 PT Individual Time Calculation (min): 53 min and 29 min    Short Term Goals: Week 1:  PT Short Term Goal 1 (Week 1): Pt will perform bed mobility with HOB elevated with min A PT Short Term Goal 2 (Week 1): Pt will perform sit <> stand and stand pivot with RW and mod A PT Short Term Goal 3 (Week 1): Pt will perform w/c mobility x 100' with supervision for UE strengthening and endurance PT Short Term Goal 4 (Week 1): Pt will perform gait x 25' with RW and mod A PT Short Term Goal 5 (Week 1): Pt will negotiate 2 stairs with 2 rails and mod A  Skilled Therapeutic Interventions/Progress Updates:    Treatment 1: Pt received in w/c & agreeable to tx, noting 6/10 low back pain but premedicated & reporting she's unable to receive medication again until 10AM. Pt on 6L/min supplemental oxygen via nasal cannula throughout session, SpO2 = 95%, HR = 89 bpm with pt reporting SOB at rest. At that time & throughout entire session, educated pt on physiological benefits and provided demonstration & cuing for pursed lip breathing with fair demo by pt. Pt propelled w/c x 30 ft + 30 ft for cardiopulmonary endurance training; pt utilized BUE & supervision at a very slow speed. Educated pt on car transfer & provided total assist for w/c set up. Pt completed car transfer w/c<>car at level height via squat pivot with mod assist, cuing for hand placement and technique. Pt reports she has a SUV with a taller seat height so pt will benefit from continued practice for car transfers. While sitting in w/c pt performed ball taps with 1 pound weighted bar x 1 minute 10 seconds + 50 seconds with rest breaks in between 2/2 fatigue & SOB. Pt with difficulty performing pursed lip breathing while engaging in tasks and with flat affect throughout session. At  end of session pt left sitting in w/c in room with all needs within reach & on 6L/min supplemental oxygen via nasal cannula. Pt's SpO2 remained >92% throughout session on 6L/min.  Treatment 2: Pt received in w/c & agreeable to tx, noting 6/10 low back pain & RN made aware; meds administered during session. Pt propelled w/c x 80 ft with BUE & supervision with cuing for turns & linear pathway; task focused on cardiopulmonary endurance training. Pt transferred sit<>stand in parallel bars with mod assist fade to min assist for transfer & static standing. Focused on hand placement & technique for transfers and standing tolerance. Pt tolerated standing 1 minute 10 seconds + 35 seconds + 50 seconds with BUE support. Pt continued to require cuing for pursed lip breathing throughout entire session. Pt on 6L/min supplemental oxygen via nasal cannula throughout entire session & SpO2 remained >90%. At end of session pt left sitting in w/c in room with all needs within reach. Pt reported incontinent void & RN/NT notified of pt needing to be cleaned up.   Therapy Documentation Precautions:  Precautions Precautions: Fall Precaution Comments: 5L 02 via nasal cannula  Restrictions Weight Bearing Restrictions: No    See Function Navigator for Current Functional Status.   Therapy/Group: Individual Therapy  Sandi MariscalVictoria M Miller 09/12/2016, 11:26 AM

## 2016-09-13 ENCOUNTER — Inpatient Hospital Stay (HOSPITAL_COMMUNITY): Payer: Medicare HMO

## 2016-09-13 ENCOUNTER — Inpatient Hospital Stay (HOSPITAL_COMMUNITY): Payer: Medicare HMO | Admitting: Occupational Therapy

## 2016-09-13 ENCOUNTER — Inpatient Hospital Stay (HOSPITAL_COMMUNITY): Payer: Medicare HMO | Admitting: Physical Therapy

## 2016-09-13 DIAGNOSIS — F3170 Bipolar disorder, currently in remission, most recent episode unspecified: Secondary | ICD-10-CM

## 2016-09-13 DIAGNOSIS — E1142 Type 2 diabetes mellitus with diabetic polyneuropathy: Secondary | ICD-10-CM

## 2016-09-13 DIAGNOSIS — J189 Pneumonia, unspecified organism: Secondary | ICD-10-CM

## 2016-09-13 DIAGNOSIS — J449 Chronic obstructive pulmonary disease, unspecified: Secondary | ICD-10-CM

## 2016-09-13 DIAGNOSIS — R918 Other nonspecific abnormal finding of lung field: Secondary | ICD-10-CM

## 2016-09-13 DIAGNOSIS — J9690 Respiratory failure, unspecified, unspecified whether with hypoxia or hypercapnia: Secondary | ICD-10-CM

## 2016-09-13 DIAGNOSIS — I10 Essential (primary) hypertension: Secondary | ICD-10-CM

## 2016-09-13 LAB — SEDIMENTATION RATE: SED RATE: 70 mm/h — AB (ref 0–22)

## 2016-09-13 LAB — CBC
HEMATOCRIT: 31.8 % — AB (ref 36.0–46.0)
HEMOGLOBIN: 9.6 g/dL — AB (ref 12.0–15.0)
MCH: 26.4 pg (ref 26.0–34.0)
MCHC: 30.2 g/dL (ref 30.0–36.0)
MCV: 87.6 fL (ref 78.0–100.0)
Platelets: 140 10*3/uL — ABNORMAL LOW (ref 150–400)
RBC: 3.63 MIL/uL — AB (ref 3.87–5.11)
RDW: 18.7 % — ABNORMAL HIGH (ref 11.5–15.5)
WBC: 5.5 10*3/uL (ref 4.0–10.5)

## 2016-09-13 LAB — GLUCOSE, CAPILLARY
GLUCOSE-CAPILLARY: 86 mg/dL (ref 65–99)
GLUCOSE-CAPILLARY: 173 mg/dL — AB (ref 65–99)
GLUCOSE-CAPILLARY: 103 mg/dL — AB (ref 65–99)
Glucose-Capillary: 131 mg/dL — ABNORMAL HIGH (ref 65–99)
Glucose-Capillary: 125 mg/dL — ABNORMAL HIGH (ref 65–99)
Glucose-Capillary: 98 mg/dL (ref 65–99)

## 2016-09-13 LAB — PROCALCITONIN

## 2016-09-13 LAB — C-REACTIVE PROTEIN: CRP: 16.2 mg/dL — AB (ref <1.0)

## 2016-09-13 MED ORDER — LIDOCAINE 5 % EX OINT
TOPICAL_OINTMENT | Freq: Four times a day (QID) | CUTANEOUS | Status: DC | PRN
  Administered 2016-09-13: 1 via TOPICAL
  Administered 2016-09-15: 18:00:00 via TOPICAL
  Filled 2016-09-13: qty 35.44

## 2016-09-13 MED ORDER — DEXTROSE 5 % IV SOLN
2.0000 g | Freq: Two times a day (BID) | INTRAVENOUS | Status: DC
  Administered 2016-09-13 – 2016-09-17 (×7): 2 g via INTRAVENOUS
  Filled 2016-09-13 (×12): qty 2

## 2016-09-13 MED ORDER — VANCOMYCIN HCL IN DEXTROSE 1-5 GM/200ML-% IV SOLN
1000.0000 mg | Freq: Two times a day (BID) | INTRAVENOUS | Status: DC
  Administered 2016-09-13 – 2016-09-16 (×6): 1000 mg via INTRAVENOUS
  Filled 2016-09-13 (×8): qty 200

## 2016-09-13 NOTE — Progress Notes (Signed)
Occupational Therapy Session Note  Patient Details  Name: Cheryl Wheeler MRN: [id number] Date of Birth: 09/26/1966  Today's Date: 09/13/2016 OT Individual Time: 1300-1425 OT Individual Time Calculation (min): 85 min     Short Term Goals: Week 1:  OT Short Term Goal 1 (Week 1): Pt will complete bathing with Min A and AE PRN OT Short Term Goal 2 (Week 1): Pt will complete LB dressing with Mod A  OT Short Term Goal 3 (Week 1): Pt will stand for 3 minutes while engaged in unilaterally involved task to increase independence with LB self care OT Short Term Goal 4 (Week 1): Pt will complete BSC transfer and LRAD with Mod A  Skilled Therapeutic Interventions/Progress Updates:     Upon entering the room, pt supine in bed with husband present this session. Pt reports extreme fatigue but agreeable to attempt OT intervention. Pt performed supine >sit with min A for trunk. Pt required multiple rest breaks secondary to fatigue and O2 on 5 L via [place] and above 92% throughout session. Pt performed squat pivot transfer from bed >wheelchair with mod lifting assistance. OT assisting pt in washing hair at sink. Pt able to comb hair with set up A to obtain materials. OT taking pt via wheelchair to dayroom for education. Pt seated in wheelchair at table with adult coloring page for R hand coordination and leisure. OT provided pt with paper handouts in regards of energy conservation with daily activities/routine as well. OT educating pt on topic as she worked on [employer] Entertainmentcolor sheet. OT returned pt to room as she requested to return to bed. Once transferring pt verbalized she had been incontinent of urine in brief. OT assisting pt with clothing management and hygiene with pt rolling from L <> R with min A for task. Pt remained supine with call bell and all needed items within reach upon exiting the room.   Therapy Documentation Precautions:  Precautions Precautions: Fall Precaution Comments: 5L 02 via nasal cannula   Restrictions Weight Bearing Restrictions: No Vital Signs: Therapy Vitals Temp: 98.4 F (36.9 C) Temp Source: Oral Pulse Rate: 93 Resp: 18 BP: 139/63 Patient Position (if appropriate): Lying Oxygen Therapy SpO2: 91 % O2 Device: Nasal Cannula O2 Flow Rate (L/min): 6 L/min ADL: ADL ADL Comments: Please see functional navigator for ADL status  See Function Navigator for Current Functional Status.   Therapy/Group: Individual Therapy  [name], [name] 09/13/2016, 2:40 PM

## 2016-09-13 NOTE — Progress Notes (Signed)
CXR demonstrated multi focal opacities in the bilat upper lobes and LLL.Obtain sputum culture and begin vancomycin and Zosyn empirically. Check oxygen saturations every shift.

## 2016-09-13 NOTE — Discharge Instructions (Signed)
Inpatient Rehab Discharge Instructions  Cheryl Wheeler Taffe Discharge date and time: No discharge date for patient encounter.   Activities/Precautions/ Functional Status: Activity: activity as tolerated Diet: diabetic diet Wound Care: none needed Functional status:  ___ No restrictions     ___ Walk up steps independently ___ 24/7 supervision/assistance   ___ Walk up steps with assistance ___ Intermittent supervision/assistance  ___ Bathe/dress independently ___ Walk with walker     ___ Bathe/dress with assistance ___ Walk Independently    ___ Shower independently ___ Walk with assistance    ___ Shower with assistance ___ No alcohol     ___ Return to work/school ________  Special Instructions: Outpatient titration sleep study with pulmonary servicesInpatient Rehab Discharge Instructions  Cheryl Wheeler Cheryl Wheeler Discharge date and time: No discharge date for patient encounter.   Activities/Precautions/ Functional Status: Activity: As tolerated Diet: Diabetic diet Wound Care: none needed Functional status:  ___ No restrictions     ___ Walk up steps independently ___ 24/7 supervision/assistance   ___ Walk up steps with assistance ___ Intermittent supervision/assistance  ___ Bathe/dress independently ___ Walk with walker     ___ Bathe/dress with assistance ___ Walk Independently    ___ Shower independently ___ Walk with assistance    ___ Shower with assistance ___ No alcohol     ___ Return to work/school ________  Special Instructions:    My questions have been answered and I understand these instructions. I will adhere to these goals and the provided educational materials after my discharge from the hospital.  Patient/Caregiver Signature _______________________________ Date __________  Clinician Signature _______________________________________ Date __________  Please bring this form and your medication list with you to all your follow-up doctor's appointments.    My questions  have been answered and I understand these instructions. I will adhere to these goals and the provided educational materials after my discharge from the hospital.  Patient/Caregiver Signature _______________________________ Date __________  Clinician Signature _______________________________________ Date __________  Please bring this form and your medication list with you to all your follow-up doctor's appointments.

## 2016-09-13 NOTE — Progress Notes (Signed)
Physical Therapy Session Note  Patient Details  Name: Cheryl NissenJonce Ann Andrew MRN: [id number] Date of Birth: 1965/12/20  Today's Date: 09/13/2016 PT Individual Time: [id number] PT Individual Time Calculation (min): 59 min    Short Term Goals: Week 1:  PT Short Term Goal 1 (Week 1): Pt will perform bed mobility with HOB elevated with min A PT Short Term Goal 2 (Week 1): Pt will perform sit <> stand and stand pivot with RW and mod A PT Short Term Goal 3 (Week 1): Pt will perform w/c mobility x 100' with supervision for UE strengthening and endurance PT Short Term Goal 4 (Week 1): Pt will perform gait x 25' with RW and mod A PT Short Term Goal 5 (Week 1): Pt will negotiate 2 stairs with 2 rails and mod A  Skilled Therapeutic Interventions/Progress Updates:  Pt received in w/c with RN present; pt reporting sudden sharp pain in L posterior shoulder under scapula during breathing treatment; RN and MD aware; MD ordered chest xray but pt cleared to participate in therapy.  Vitals assessed while RN provided medication.  Pt transported to gym and performed transfers w/c <> Nustep squat and stand pivot with mod A.  Performed UE and LE strengthening, ROM and endurance training on Nustep at level 3 x 5 minutes with one rest break.  Husband present for second half of session; informed husband of patient's pain and premedication by RN.  Husband states that pt would use lidocaine cream at home due to fibromyalgia and previous RUE injury/trauma; husband pulled a jar of lidocaine cream from his jacket pocket.  Informed husband that he would have to discuss use of cream with MD because he would have to approve it and write an order for it. Husband verbalized understanding but alerted RN that husband was in possession of cream.  Pt transferred back to w/c and transported over to stairs and performed sit > stand from w/c with mod A and performed 2 sets x 6 reps alternating foot taps to 3" step; on third repetition pt performed  one step up ascending forwards, descending backwards with mod A.  Returned to w/c and to room; pt left up in w/c to await transport to chest x-ray.    Therapy Documentation Precautions:  Precautions Precautions: Fall Precaution Comments: 5L 02 via nasal cannula  Restrictions Weight Bearing Restrictions: No Vital Signs: Therapy Vitals Pulse Rate: 93 BP: 118/74 Patient Position (if appropriate): Sitting Oxygen Therapy SpO2: 93 % O2 Device: Nasal Cannula O2 Flow Rate (L/min): 6 L/min Pulse Oximetry Type: Intermittent Pain: Pain Assessment Pain Assessment: 0-10 Pain Score: 6  Pain Type: Acute pain Pain Location: Shoulder Pain Orientation: Left Pain Descriptors / Indicators: Sharp Pain Frequency: Other (Comment) (on inspiration) Pain Onset: Sudden Pain Intervention(s): RN made aware 2nd Pain Site Pain Score: 6 Pain Type: Chronic pain Pain Location: Back Pain Orientation: Lower Pain Frequency: Constant Pain Onset: On-going Pain Intervention(s): Medication (See eMAR)   See Function Navigator for Current Functional Status.   Therapy/Group: Individual Therapy  Edman CircleHall, Malia Corsi [facility] 09/13/2016, 12:24 PM

## 2016-09-13 NOTE — Progress Notes (Addendum)
Pharmacy Antibiotic Note [age] YOF with chronic granulomatous disease and maintained on Bactrim for prophylaxis followed by immunologist as outpatient, [facility] with acute on chronic respiratory failure required intubation and broad spectrum abx. Now repeat Xray with Multifocal patchy opacities in the bilateral upper lobes and left lower lobe, suspicious for pneumonia, pharmacy is consulted to start vancomycin and cefepime empirically.   She is afebrile, scr 0.5 on 12/1, est. crcl ~ 75 ml/min. No new cultures  Plan: - Vancomycin 1g IV Q 12hrs - cefepime 2g IV Q 12 hrs - BMET Q 72 hrs from tomorrow AM - Vancomycin trough after 2-3 days  Height: 4' 11.5" (151.1 cm) Weight: 180 lb 12.4 oz (82 kg) IBW/kg (Calculated) : 44.35  Temp (24hrs), Avg:98.1 F (36.7 C), Min:97.5 F (36.4 C), Max:98.7 F (37.1 C)   Recent Labs Lab 09/07/16 0238 09/08/16 0245 09/09/16 0317 09/10/16 0446  WBC 9.4 10.3 8.8 9.9  CREATININE 0.39* 0.50 0.35* 0.50    Estimated Creatinine Clearance: 78 mL/min (by C-G formula based on SCr of 0.5 mg/dL).    Allergies  Allergen Reactions  . Fentanyl Shortness Of Breath  . Meperidine Other (See Comments)    chest pain-"feels like squeezing heart"  . Azithromycin Other (See Comments)    VRE  . Codeine Nausea Only  . Levofloxacin Other (See Comments)    Unknown  . Other Other (See Comments)    BLACK STICHES=BODY REJECTS THEM  . Penicillins Rash  . Sulfa Antibiotics Rash    Tolerates Bactrim    Antimicrobials this admission: Vancomycin 12/4 >> Zosyn 12/4 >> On chronic Bactrim     Dose adjustments this admission:   Microbiology results:  Thank you for allowing pharmacy to be a part of this patient's care.  Bayard HuggerMei Celisse Ciulla, PharmD, BCPS  Clinical Pharmacist  Pager: [phone]   09/13/2016 12:45 PM

## 2016-09-13 NOTE — Progress Notes (Signed)
PULMONARY / CRITICAL CARE MEDICINE   Name: [name] MRN: [id number] DOB: 05/16/1966    ADMISSION DATE:  09/09/2016  REFERRING MD:  Dr. Posey Pronto   CHIEF COMPLAINT:  Dyspnea  BRIEF:  Cheryl Wheeler is a 50 y.o. female with ?end-stage COPD on 3 L oxygen and BiPAP at home, chronic granulomatous disease, hypertension, bipolar disorder, anxiety and GERD who transfered from [facility] for possible tracheostomy after needing continuous positive pressure ventilation with BiPAP for 3 days.   On 08/22/2016, patient presented to [facility] with dyspnea, dry cough and subjective fever and hypoxia to 70's. Initial ABG was 7.4/66/45/41 at [facility]. CBC significant for mild leukocytosis. BMP remarkable for hypoosmolar hyponatremia to 128 otherwise within normal limits. UA was remarkable for pyuria but urine culture was negative. She was started on Solu-Medrol, DuoNeb, Pulmicort and guanfacine-DM for COPD exacerbation. However, she continued to have worsening dyspnea with increased oxygen requirement on BiPAP continuously. She was also started on cefepime and vancomycin on 08/24/2016.  On the day of transfer, vitals and was normal limits except for tachypnea to 25, ABG was 7.4/65/75/41/ Last BMP within normal limits. Mild leukocytosis and hyponatremia resolved.   On arrival, patient is on BiPAP with some increased work of breathing. She reports headache at the site where the the BiPAP mask rests on her forehead, denies chest pain, admits shortness of breath, denies abdominal pain, denies dysuria and admits chronic back pain. She reports swelling in the right foot and right calf for about a month prior to presentation to [facility] that has resolved.  Patient likes to have intubation or trach done.   SIGNIFICANT EVENTS: 11/12 - Admitted to [facility] where his acute on chronic COPD exacerbation and hypoosmolar hyponatremia 11/14 - Started on vancomycin and cefepime 11/16 -  Transferred to [facility] ICU for possible tracheostomy due to continuous need for BiPAP and increased oxygen requirement.  11/17 - Intubated. CT chest negative for PE.  11/18 - Bronch RML, no DAH, some erthyema blood, BAL sent, tolerated 11/24 - extubated  11/26 - TRH primary  11/30 - Transfer to inpatient rehab   SUBJECTIVE: Patient does endorse some increasing dyspnea over the last day or so. Intermittent nonproductive cough. Does also report some intermittent "sharp" chest pain in her left parasternal area. No other chest tightness. Patient is getting out of bed.  REVIEW OF SYSTEMS: No nausea or vomiting. No diarrhea. No abdominal pain. No subjective fever or chills. No headache or vision changes.  VITAL SIGNS: BP 139/63   Pulse 93   Temp 98.4 F (36.9 C) (Oral)   Resp 18   Ht 4' 11.5" (1.511 m)   Wt 82 kg (180 lb 12.4 oz)   SpO2 91%   BMI 35.90 kg/m   HEMODYNAMICS:    VENTILATOR SETTINGS:    INTAKE / OUTPUT: I/O last 3 completed shifts: In: 480 [P.O.:480] Out: -   PHYSICAL EXAMINATION: General:  Awake. Alert. No acute distress. Laying in bed watching TV.  Integument:  Warm & dry. No rash on exposed skin.  HEENT:  Moist mucus membranes. No scleral injection or icterus.  Cardiovascular:  Regular rate and rhythm. Trace edema. Pulmonary:  Diminished breath sounds bilaterally. Normal work of breathing on nasal cannula at 5 L/m. Speaking in complete sentences. Abdomen: Soft. Normal bowel sounds. Protuberant. Neurological: Alert and oriented 3. No meningismus. Following commands.  LABS:  BMET  Recent Labs Lab 09/08/16 0245 09/09/16 0317 09/10/16 0446  NA 136 137 135  K  4.3 4.0 3.8  CL 93* 92* 88*  CO2 33* 34* 35*  BUN '14 13 14  ' CREATININE 0.50 0.35* 0.50  GLUCOSE 117* 137* 121*    Electrolytes  Recent Labs Lab 09/08/16 0245 09/09/16 0317 09/10/16 0446  CALCIUM 9.3 9.1 9.1    CBC  Recent Labs Lab 09/08/16 0245 09/09/16 0317 09/10/16 0446  WBC 10.3  8.8 9.9  HGB 11.1* 10.5* 10.5*  HCT 36.2 35.0* 34.8*  PLT 197 181 157    Coag's No results for input(s): APTT, INR in the last 168 hours.  Sepsis Markers No results for input(s): LATICACIDVEN, PROCALCITON, O2SATVEN in the last 168 hours.  ABG  Recent Labs Lab 09/09/16 0820  PHART 7.452*  PCO2ART 58.0*  PO2ART 60.3*    Liver Enzymes  Recent Labs Lab 09/10/16 0446  AST 14*  ALT 26  ALKPHOS 37*  BILITOT 0.6  ALBUMIN 2.9*    Cardiac Enzymes No results for input(s): TROPONINI, PROBNP in the last 168 hours.  Glucose  Recent Labs Lab 09/12/16 1131 09/12/16 1703 09/12/16 2028 09/13/16 0638 09/13/16 0937 09/13/16 1152  GLUCAP 77 105* 125* 131* 125* 173*    Imaging Dg Chest 2 View  Result Date: 09/13/2016 CLINICAL DATA:  Chest pain EXAM: CHEST  2 VIEW COMPARISON:  09/09/2016 FINDINGS: Multifocal patchy opacities in the bilateral upper lobes and left lower lobe, suspicious for pneumonia. No pleural effusion or pneumothorax. The heart is top-normal in size. Visualized osseous structures are within normal limits. IMPRESSION: Multifocal patchy opacities in the bilateral upper lobes and left lower lobe, suspicious for pneumonia. Electronically Signed   By: Julian Hy M.D.   On: 09/13/2016 11:46     STUDIES:  Venous Duplex 11/16: negative for DVT  Bronch 11/18: RML, no DAH, some erythema blood rml  CXR PA/LAT 12/4:  Personally reviewed by me. Patchy bilateral opacities. These may have been present to some degree on imaging from her chest x-ray on 11/30. No obvious consolidation or air bronchograms. No pleural effusion. Marked kyphosis. Heart normal in size and mediastinum normal in contour.  MICROBIOLOGY: Culture at OSH negative MRSA PCR 11/16:  Negative Urine Ctx 11/17:  Negative BAL RML 11/18:  Normal Flora Urine Ctx 11/22:  10,000 colonies yeast  ANTIBIOTICS: Rocephin 11/21 - 11/24 Bactrim BID 11/30 >> Cefepime 11/14 - 11/16; 12/14 >> Vancomycin  11/14 - 11/16; 12/4 >>  SEROLOGIES: 08/2016 Alpha-1 antitrypsin: 120 ANA: Negative Rheumatoid factor: 10.6 C3: 103 C4: 8  LINES/TUBES: PIV  ASSESSMENT / PLAN:  50 y.o. female with history of COPD and chronic hypoxic respiratory failure on 2 L/m at home chronically. Patient also utilizes noninvasive positive pressure ventilation at home. Previous history of chronic granulomatous disease. Developing acute on chronic hypoxic respiratory failure. Multifocal opacities could certainly represent a developing multifocal pneumonia. In the absence of fevers or any other infectious symptoms alternative etiologies must be considered.  1. Acute on chronic hypoxic respiratory failure: Recommend continuing to wean FiO2 & encourage incentive spirometry and out of bed to minimize atelectasis. 2. Bilateral lung opacities: Checking serum autoimmune workup with ESR, CRP, double-stranded DNA antibody, histone antibody, and Smith antibody. Consider CT chest without contrast if serologic workup is unrevealing. 3. Possible HCAP: Agree with continuing broad-spectrum antibiotics with vancomycin and cefepime. Trending Procalcitonin per algorithm with low threshold to de-escalate antibiotics. Checking urine streptococcal and legionella antigens.  4.  COPD: No signs of exacerbation. Continuing Pulmicort nebulized twice a day & DuoNeb nebulized 4 times a day.   Remainder  of care as per primary service.  Sonia Baller Ashok Cordia, M.D. [facility]monary [facility] Critical Care Pager:  [phone] After 3pm or if no response, call [phone] 7:27 PM 09/13/16

## 2016-09-13 NOTE — Procedures (Signed)
Placed patient on BIPAP for the night.  Patient is tolerating well at this time. 

## 2016-09-13 NOTE — Progress Notes (Signed)
Occupational Therapy Session Note  Patient Details  Name: Cheryl Wheeler MRN: [id number] Date of Birth: [birth year]  Today's Date: 09/13/2016 OT Individual Time: [id number] OT Individual Time Calculation (min):  59 min  Short Term Goals: Week 1:  OT Short Term Goal 1 (Week 1): Pt will complete bathing with Min A and AE PRN OT Short Term Goal 2 (Week 1): Pt will complete LB dressing with Mod A  OT Short Term Goal 3 (Week 1): Pt will stand for 3 minutes while engaged in unilaterally involved task to increase independence with LB self care OT Short Term Goal 4 (Week 1): Pt will complete BSC transfer and LRAD with Mod A  Skilled Therapeutic Interventions/Progress Updates:   Pt was lying supine in bed at time of arrival, reporting wanting to complete ADLs at sink due to not having home shampoo (she reported husband would bring later today). Resting 02 sats on 5L 91%. Pt completed squat pivot from bed<w/c with Mod A. 02 sats decreased to 88% and did not increase after several minutes of PLB. Pt bumped up to 6L for activity, with 02 sats increasing to 95%. Bathing/dressing completed w/c level at sink with instruction on energy conservation, PLB, and safety during sit<stands. Pt able to wash both feet with use of adaptive footstool and extra time. Reacher used for LB dressing with instruction and setup. Trialed sock aide with pt unable to use functionally due to decreased UB strength. Pt continues to benefit from AE training to further independence with self care. 02 sats ranging from 93-95% during tx, 93% post standing. At end of session pt was left in wi/c with all needs within reach. Pt 02 decreased back to 5L and satting at 95% at time of departure.   Therapy Documentation Precautions:  Precautions Precautions: Fall Precaution Comments: 5L 02 via nasal cannula  Restrictions Weight Bearing Restrictions: No   Vital Signs: Oxygen Therapy SpO2: 93 % O2 Device: Nasal Cannula O2 Flow Rate (L/min):  6 L/min Pain: No c/o pain during session    ADL: ADL ADL Comments: Please see functional navigator for ADL status    See Function Navigator for Current Functional Status.   Therapy/Group: Individual Therapy  Andilynn Delavega A Hope Brandenburger 09/13/2016, 6:14 PM

## 2016-09-13 NOTE — Progress Notes (Signed)
[facility] PHYSICAL MEDICINE & REHABILITATION     PROGRESS NOTE  Subjective/Complaints:  Pt seen sitting up in bed this AM.  She states she had a good weekend.  She slept well overnight.   ROS: Denies CP, SOB, N/V/D.  Objective: Vital Signs: Blood pressure 118/74, pulse 93, temperature 98.7 F (37.1 C), temperature source Oral, resp. rate 20, height 4' 11.5" (1.511 m), weight 82 kg (180 lb 12.4 oz), SpO2 93 %. Dg Chest 2 View  Result Date: 09/13/2016 CLINICAL DATA:  Chest pain EXAM: CHEST  2 VIEW COMPARISON:  09/09/2016 FINDINGS: Multifocal patchy opacities in the bilateral upper lobes and left lower lobe, suspicious for pneumonia. No pleural effusion or pneumothorax. The heart is top-normal in size. Visualized osseous structures are within normal limits. IMPRESSION: Multifocal patchy opacities in the bilateral upper lobes and left lower lobe, suspicious for pneumonia. Electronically Signed   By: Charline BillsSriyesh  Krishnan M.D.   On: 09/13/2016 11:46   No results for input(s): WBC, HGB, HCT, PLT in the last 72 hours. No results for input(s): NA, K, CL, GLUCOSE, BUN, CREATININE, CALCIUM in the last 72 hours.  Invalid input(s): CO CBG (last 3)   Recent Labs  09/13/16 0638 09/13/16 0937 09/13/16 1152  GLUCAP 131* 125* 173*    Wt Readings from Last 3 Encounters:  09/13/16 82 kg (180 lb 12.4 oz)  09/09/16 81.8 kg (180 lb 5.4 oz)    Physical Exam:  BP 118/74   Pulse 93   Temp 98.7 F (37.1 C) (Oral)   Resp 20   Ht 4' 11.5" (1.511 m)   Wt 82 kg (180 lb 12.4 oz)   SpO2 93%   BMI 35.90 kg/m  Constitutional: She appears well-developed. NAD.  HENT: Normocephalic. Atraumatic. Eyes: EOMare normal. No discharge.  Cardiovascular: RRR. No JVD. Respiratory: Effort normal. No respiratory distress.  + [place] GI: Soft. Bowel sounds are normal.   Neurological: She is alertand oriented. Motor: B/l UE: 4-/5 deltoid, 4/5 bicep, tricep and 4/5 wrist and HI (stable).  B/l LE: 2+/5 HF, 3/5 KE and  2+/5 ADF/PF.  Skin: Skin is warmand dry.  Psychiatric: She has a normal mood and affect. Her behavior is normal  Assessment/Plan: 1. 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 Physiatrist is providing close team supervision and 24 hour management of active medical problems listed below. Physiatrist and rehab team continue to assess barriers to discharge/monitor patient progress toward functional and medical goals.  Function:  Bathing Bathing position   Position: Wheelchair/chair at sink  Bathing parts Body parts bathed by patient: Right arm, Left arm, Chest, Abdomen, Front perineal area Body parts bathed by helper: Buttocks, Back  Bathing assist Assist Level: Touching or steadying assistance(Pt > 75%)      Upper Body Dressing/Undressing Upper body dressing   What is the patient wearing?: Pull over shirt/dress     Pull over shirt/dress - Perfomed by patient: Thread/unthread right sleeve, Thread/unthread left sleeve, Put head through opening, Pull shirt over trunk          Upper body assist Assist Level: Supervision or verbal cues      Lower Body Dressing/Undressing Lower body dressing   What is the patient wearing?: Underwear, Pants, Non-skid slipper socks Underwear - Performed by patient: Thread/unthread right underwear leg Underwear - Performed by helper: Thread/unthread left underwear leg, Pull underwear up/down Pants- Performed by patient: Thread/unthread right pants  leg Pants- Performed by helper: Thread/unthread left pants leg, Pull pants up/down   Non-skid slipper socks- Performed by helper: Don/doff right sock, Don/doff left sock                  Lower body assist Assist for lower body dressing:  (Max A)      Toileting Toileting     Toileting steps completed by helper: Adjust clothing prior to toileting,  Performs perineal hygiene, Adjust clothing after toileting    Toileting assist     Transfers Chair/bed transfer   Chair/bed transfer method: Squat pivot Chair/bed transfer assist level: Moderate assist (Pt 50 - 74%/lift or lower) Chair/bed transfer assistive device: Armrests     Locomotion Ambulation     Max distance: 3 Assist level: Moderate assist (Pt 50 - 74%)   Wheelchair   Type: Manual Max wheelchair distance: 80 ft Assist Level: Supervision or verbal cues  Cognition Comprehension Comprehension assist level: Follows basic conversation/direction with extra time/assistive device  Expression Expression assist level: Expresses basic needs/ideas: With no assist  Social Interaction Social Interaction assist level: Interacts appropriately with others with medication or extra time (anti-anxiety, antidepressant).  Problem Solving Problem solving assist level: Solves basic problems with no assist  Memory Memory assist level: Recognizes or recalls 90% of the time/requires cueing < 10% of the time    Medical Problem List and Plan: 1. 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 Oxygen dependent prior to admission with BiPAP at home  Cont CIR 2. DVT Prophylaxis/Anticoagulation: SCDs.  3. Pain Management/chronic back pain:   MSIR 15 mg twice a day 4. Mood/bipolar disorder: Remeron 30 mg daily, Risperdal 2 mg twice a day, Depakene 500 mg twice a day 5. Neuropsych: This patient iscapable of making decisions on herown behalf. 6. Skin/Wound Care: Routine skin checks 7. Fluids/Electrolytes/Nutrition: Routine I&Os 8.Chronic granulomatous disease.   Continue Bactrim prophylaxis 9.Diabetes mellitus and peripheral neuropathy. Hemoglobin A1c 6.5. Lantus insulin 14 units daily, NovoLog 5 units 3 times a day. Check blood sugars before meals and at bedtime  Relatively controlled 12/4 10.Hypertension. Labetalol 300 mg 3  times a day, . Monitor with increased mobility  Controlled on 12/4 11.Acute on chronic diastolic congestive heart failure. Lasix 40 mg daily. Monitor for any signs of fluid overload   Filed Weights   09/11/16 0517 09/12/16 0500 09/13/16 0344  Weight: 80.4 kg (177 lb 3.2 oz) 81.6 kg (179 lb 14.3 oz) 82 kg (180 lb 12.4 oz)   12.Hyperlipidemia. Lipitor 13. Urinary incontinence.   UA, C&S remains pending 14. ABLA  Hb 10.5 on 12/1  Cont to monitor 15. Hypoalbuminemia  Supplement initiated 12/1 16. HCAP  CXR reviewed  Sputum culture ordered  Vanc, Zosyn ordered  Allergic to fluoroquinolones    LOS (Days) 4 A FACE TO FACE EVALUATION WAS PERFORMED  Reighn Kaplan Karis Jubanil Maura Braaten 09/13/2016 12:35 PM

## 2016-09-14 ENCOUNTER — Encounter (HOSPITAL_COMMUNITY): Payer: Medicare HMO

## 2016-09-14 ENCOUNTER — Inpatient Hospital Stay (HOSPITAL_COMMUNITY): Payer: Medicare HMO

## 2016-09-14 ENCOUNTER — Inpatient Hospital Stay (HOSPITAL_COMMUNITY): Payer: Medicare HMO | Admitting: Occupational Therapy

## 2016-09-14 ENCOUNTER — Inpatient Hospital Stay (HOSPITAL_COMMUNITY): Payer: Medicare HMO | Admitting: Physical Therapy

## 2016-09-14 DIAGNOSIS — R918 Other nonspecific abnormal finding of lung field: Secondary | ICD-10-CM

## 2016-09-14 DIAGNOSIS — J449 Chronic obstructive pulmonary disease, unspecified: Secondary | ICD-10-CM

## 2016-09-14 DIAGNOSIS — J9621 Acute and chronic respiratory failure with hypoxia: Secondary | ICD-10-CM

## 2016-09-14 LAB — URINALYSIS, ROUTINE W REFLEX MICROSCOPIC
Bilirubin Urine: NEGATIVE
GLUCOSE, UA: NEGATIVE mg/dL
HGB URINE DIPSTICK: NEGATIVE
Ketones, ur: NEGATIVE mg/dL
LEUKOCYTES UA: NEGATIVE
Nitrite: NEGATIVE
PH: 5 (ref 5.0–8.0)
PROTEIN: NEGATIVE mg/dL
SPECIFIC GRAVITY, URINE: 1.016 (ref 1.005–1.030)

## 2016-09-14 LAB — GLUCOSE, CAPILLARY
Glucose-Capillary: 119 mg/dL — ABNORMAL HIGH (ref 65–99)
Glucose-Capillary: 133 mg/dL — ABNORMAL HIGH (ref 65–99)
Glucose-Capillary: 100 mg/dL — ABNORMAL HIGH (ref 65–99)
Glucose-Capillary: 109 mg/dL — ABNORMAL HIGH (ref 65–99)

## 2016-09-14 LAB — STREP PNEUMONIAE URINARY ANTIGEN: STREP PNEUMO URINARY ANTIGEN: NEGATIVE

## 2016-09-14 MED ORDER — RISPERIDONE 1 MG PO TABS
2.0000 mg | ORAL_TABLET | Freq: Every day | ORAL | Status: DC
  Filled 2016-09-14: qty 2

## 2016-09-14 NOTE — Progress Notes (Signed)
Occupational Therapy Session Note  Patient Details  Name: Cheryl Wheeler MRN: [id number] Date of Birth: 01-31-1966  Today's Date: 09/14/2016 OT Individual Time: [id number] OT Individual Time Calculation (min): 46 min     Short Term Goals: Week 1:  OT Short Term Goal 1 (Week 1): Pt will complete bathing with Min A and AE PRN OT Short Term Goal 2 (Week 1): Pt will complete LB dressing with Mod A  OT Short Term Goal 3 (Week 1): Pt will stand for 3 minutes while engaged in unilaterally involved task to increase independence with LB self care OT Short Term Goal 4 (Week 1): Pt will complete BSC transfer and LRAD with Mod A  Skilled Therapeutic Interventions/Progress Updates:     Upon entering the room, pt seated in wheelchair sleeping. Pt verbalizes she wishes to participate this session and has 5/10 c/o pain in lower back. RN notified and asked for k pad to decrease pain. Pt seated in wheelchair at sink for grooming tasks this session with set up A to obtain items and increased time. Pt then began falling asleep but reports still wanting to participate. OT propelled pt via wheelchair to day room to start B UE strengthening exercises. OT demonstrated B UE exercises with use of orange. Level 2 theraband. Pt returns demonstrations with min cues for technique but continues to fall asleep. Pt returned to room as she is unable to actively participate due to decreased alertness. Pt had rapid response called last night secondary to destat of vitals. Pt performed squat pivot with mod A for lifting to bed. Sit >supine with mod A for B LEs. Pt supine in bed with bed alarm activated and call bell within reach upon exiting the room.   Therapy Documentation Precautions:  Precautions Precautions: Fall Precaution Comments: 5L 02 via nasal cannula  Restrictions Weight Bearing Restrictions: No General: General OT Amount of Missed Time: 14 Minutes Vital Signs: Therapy Vitals Pulse Rate: 92 Resp: 20 Oxygen  Therapy SpO2: 91 % O2 Device: Bi-PAP O2 Flow Rate (L/min): 4.5 L/min (Bled in to Bipap) Pain: Pain Assessment Pain Score: Asleep ADL: ADL ADL Comments: Please see functional navigator for ADL status Exercises:   Other Treatments:    See Function Navigator for Current Functional Status.   Therapy/Group: Individual Therapy    Cheryl Wheeler, Cheryl Wheeler 09/14/2016, 9:53 AM

## 2016-09-14 NOTE — Progress Notes (Signed)
Patients o2 was increased to 4 liters.

## 2016-09-14 NOTE — Progress Notes (Signed)
Physical Therapy Session Note  Patient Details  Name: Cheryl Wheeler MRN: [id number] Date of Birth: [birth year]  Today's Date: 09/14/2016 PT Individual Time: [id number] PT Individual Time Calculation (min): 28 min    Short Term Goals:Week 1:  PT Short Term Goal 1 (Week 1): Pt will perform bed mobility with HOB elevated with min A PT Short Term Goal 2 (Week 1): Pt will perform sit <> stand and stand pivot with RW and mod A PT Short Term Goal 3 (Week 1): Pt will perform w/c mobility x 100' with supervision for UE strengthening and endurance PT Short Term Goal 4 (Week 1): Pt will perform gait x 25' with RW and mod A PT Short Term Goal 5 (Week 1): Pt will negotiate 2 stairs with 2 rails and mod A  Skilled Therapeutic Interventions/Progress Updates:  Pt received in bed & agreeable to tx, on 3L/min supplemental oxygen via nasal cannula, SpO2 = 90%, HR = 85 bpm. Pt appeared fatigued but agreeable to bed level exercises. Pt performed BLE ankle pumps, short arc quads, hip abduction, straight leg raises and heel slides (all AAROM) all with cuing for proper technique & to keep eyes open as pt frequently drifted off to sleep. Pt would participate in verbally counting repetitions with max cuing. Pt's SpO2 ranged from 90-94% on 3L/min via nasal cannula (90% when therapist left room). At end of session pt left in bed with all needs within reach & bed alarm set.   Pt missed 47 minutes scheduled PT treatment session 2/2 fatigue.  Therapy Documentation Precautions:  Precautions Precautions: Fall Precaution Comments: 5L 02 via nasal cannula  Restrictions Weight Bearing Restrictions: No   See Function Navigator for Current Functional Status.   Therapy/Group: Individual Therapy  Sandi MariscalVictoria M Braley Luckenbaugh 09/14/2016, 4:45 PM

## 2016-09-14 NOTE — Significant Event (Signed)
Rapid Response Event Note RN called for SOB Overview: Time Called: 0441 Arrival Time: 0443 Event Type: Respiratory  Initial Focused Assessment: On arrival pt a/o x4, skin warm and dry, labored breathing. RT at bedside placed pt on NRB sats increased to 98% from 52% 5L [place]. Pt taken off of bipap at 0400 since she was awake for the day. sats dropped when pt was being rolled and cleaned by NT. BP 162/65, HR 103, 98% NRB. 100% on bipap  RN paged PA, new orders to place pt back on bipap, get a CXR due to yesterdays cxr resulted in new onset pna. MD on site and to come by for further evaluation.  Interventions: CXR, placed back on bipap Plan of Care (if not transferred): RN encouraged to call for any assistance Event Summary: Name of Physician Notified: Pam PA  at [phone]    at    Outcome: Stayed in room and stabalized     RosemontSHULAR, Dyllan Kats KahokaPaige

## 2016-09-14 NOTE — Progress Notes (Signed)
Physical Therapy Session Note  Patient Details  Name: Cheryl Wheeler MRN: [id number] Date of Birth: Mar 19, 1966  Today's Date: 09/14/2016 PT Individual Time: 0802-0900 PT Individual Time Calculation (min): 58 min    Short Term Goals: Week 1:  PT Short Term Goal 1 (Week 1): Pt will perform bed mobility with HOB elevated with min A PT Short Term Goal 2 (Week 1): Pt will perform sit <> stand and stand pivot with RW and mod A PT Short Term Goal 3 (Week 1): Pt will perform w/c mobility x 100' with supervision for UE strengthening and endurance PT Short Term Goal 4 (Week 1): Pt will perform gait x 25' with RW and mod A PT Short Term Goal 5 (Week 1): Pt will negotiate 2 stairs with 2 rails and mod A  Skilled Therapeutic Interventions/Progress Updates:   Pt with episode of respiratory distress overnight with desat 52% and rapid response team called. Pt on BiPap upon entering room with nsg in process of changing to [place] 5L.  Pt remained at 93-95% at rest and while eating breakfast. Supine to sit with HOB elevated min guard with additional time. Stand pivot to w/c with PTA providing total assist to don pants. Switched to portable O2 tank at 6L. Pt propelled 74f out of room with mod cues for neogotiating turns and x 1 break 2/2 fatigue. SpO2 remained >91% throughout all activities on 6L Pt returned to room and performed LAQ, hip flexion and ankle pumps x 15 bilaterally however requiring additional time 2/2 malaise. Pt left in w/c awaiting next therapy session with all needs met.   Therapy Documentation Precautions:  Precautions Precautions: Fall Precaution Comments: 5L 02 via nasal cannula  Restrictions Weight Bearing Restrictions: No General:   Vital Signs: Oxygen Therapy SpO2: 96 % O2 Device: Nasal Cannula O2 Flow Rate (L/min): 3 L/min Pain:   Mobility:   Locomotion :    Trunk/Postural Assessment :    Balance:   Exercises:   Other Treatments:     See Function Navigator for  Current Functional Status.   Therapy/Group: Individual Therapy  Jaylen Claude 09/14/2016, 12:48 PM

## 2016-09-14 NOTE — Progress Notes (Signed)
[facility] PHYSICAL MEDICINE & REHABILITATION     PROGRESS NOTE  Subjective/Complaints:  Pt laying in bed this AM with Bipap.  Pt sleepy this AM.  Evaluated pt Pulm yesterday. Per report, overnight pt with desats, rapid response called to bedside, bipap placed, CXR ordered.   ROS: Denies CP, N/V/D.  Objective: Vital Signs: Blood pressure 126/66, pulse 92, temperature 97.9 F (36.6 C), temperature source Oral, resp. rate 20, height 4' 11.5" (1.511 m), weight 83 kg (182 lb 15.7 oz), SpO2 91 %. Dg Chest 2 View  Result Date: 09/13/2016 CLINICAL DATA:  Chest pain EXAM: CHEST  2 VIEW COMPARISON:  09/09/2016 FINDINGS: Multifocal patchy opacities in the bilateral upper lobes and left lower lobe, suspicious for pneumonia. No pleural effusion or pneumothorax. The heart is top-normal in size. Visualized osseous structures are within normal limits. IMPRESSION: Multifocal patchy opacities in the bilateral upper lobes and left lower lobe, suspicious for pneumonia. Electronically Signed   By: Charline Bills M.D.   On: 09/13/2016 11:46   Dg Chest Port 1 View  Result Date: 09/14/2016 CLINICAL DATA:  Increasing dyspnea this morning EXAM: PORTABLE CHEST 1 VIEW COMPARISON:  09/13/2016 FINDINGS: Multifocal patchy opacities persist bilaterally without significant interval change. No large effusion. Normal pulmonary vasculature. Hilar and mediastinal contours are unremarkable and unchanged. IMPRESSION: Multifocal airspace opacities persist bilaterally without significant interval change. Electronically Signed   By: Ellery Plunk M.D.   On: 09/14/2016 06:04    Recent Labs  09/13/16 1524  WBC 5.5  HGB 9.6*  HCT 31.8*  PLT 140*   No results for input(s): NA, K, CL, GLUCOSE, BUN, CREATININE, CALCIUM in the last 72 hours.  Invalid input(s): CO CBG (last 3)   Recent Labs  09/13/16 1631 09/13/16 2108 09/14/16 0628  GLUCAP 98 103* 119*    Wt Readings from Last 3 Encounters:  09/14/16 83 kg (182 lb  15.7 oz)  09/09/16 81.8 kg (180 lb 5.4 oz)    Physical Exam:  BP 126/66 (BP Location: Right Arm)   Pulse 92   Temp 97.9 F (36.6 C) (Oral)   Resp 20   Ht 4' 11.5" (1.511 m)   Wt 83 kg (182 lb 15.7 oz)   SpO2 91%   BMI 36.34 kg/m  Constitutional: She appears well-developed. NAD.  HENT: Normocephalic. Atraumatic. Eyes: EOMare normal. No discharge.  Cardiovascular: RRR. No JVD. Respiratory: Effort normal. No respiratory distress.  + Bipap GI: Soft. Bowel sounds are normal.   Neurological: She is alertand oriented. Motor: B/l UE: 4-/5 deltoid, 4/5 bicep, tricep and 4/5 wrist and HI (stable).  B/l LE: 2+/5 HF, 3/5 KE and 2+/5 ADF/PF (stable).  Skin: Skin is warmand dry.  Psychiatric: She has a normal mood and affect. Her behavior is normal  Assessment/Plan: 1. 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 Physiatrist is providing close team supervision and 24 hour management of active medical problems listed below. Physiatrist and rehab team continue to assess barriers to discharge/monitor patient progress toward functional and medical goals.  Function:  Bathing Bathing position   Position: Wheelchair/chair at sink  Bathing parts Body parts bathed by patient: Right arm, Left arm, Chest, Abdomen, Front perineal area, Right upper leg, Left upper leg, Right lower leg, Left lower leg Body parts bathed by helper: Buttocks, Back  Bathing assist Assist Level: Touching or steadying assistance(Pt > 75%)      Upper  Body Dressing/Undressing Upper body dressing   What is the patient wearing?: Pull over shirt/dress     Pull over shirt/dress - Perfomed by patient: Thread/unthread right sleeve, Thread/unthread left sleeve, Put head through opening, Pull shirt over trunk          Upper body assist Assist Level: Supervision or verbal  cues      Lower Body Dressing/Undressing Lower body dressing   What is the patient wearing?: Underwear, Pants, Non-skid slipper socks Underwear - Performed by patient: Thread/unthread left underwear leg, Thread/unthread right underwear leg Underwear - Performed by helper: Pull underwear up/down Pants- Performed by patient: Thread/unthread right pants leg, Thread/unthread left pants leg Pants- Performed by helper: Pull pants up/down   Non-skid slipper socks- Performed by helper: Don/doff right sock, Don/doff left sock                  Lower body assist Assist for lower body dressing:  (Mod A)      Toileting Toileting   Toileting steps completed by patient: Adjust clothing prior to toileting Toileting steps completed by helper: Adjust clothing prior to toileting, Performs perineal hygiene, Adjust clothing after toileting Toileting Assistive Devices: Grab bar or rail  Toileting assist     Transfers Chair/bed transfer   Chair/bed transfer method: Squat pivot Chair/bed transfer assist level: Moderate assist (Pt 50 - 74%/lift or lower) Chair/bed transfer assistive device: Armrests     Locomotion Ambulation     Max distance: 3 Assist level: Moderate assist (Pt 50 - 74%)   Wheelchair   Type: Manual Max wheelchair distance: 80 ft Assist Level: Supervision or verbal cues  Cognition Comprehension Comprehension assist level: Understands basic 50 - 74% of the time/ requires cueing 25 - 49% of the time  Expression Expression assist level: Expresses basic 50 - 74% of the time/requires cueing 25 - 49% of the time. Needs to repeat parts of sentences.  Social Interaction Social Interaction assist level: Interacts appropriately 50 - 74% of the time - May be physically or verbally inappropriate.  Problem Solving Problem solving assist level: Solves basic 50 - 74% of the time/requires cueing 25 - 49% of the time  Memory Memory assist level: Recognizes or recalls 90% of the  time/requires cueing < 10% of the time    Medical Problem List and Plan: 1. 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 Oxygen dependent prior to admission with BiPAP at home  Cont CIR 2. DVT Prophylaxis/Anticoagulation: SCDs.  3. Pain Management/chronic back pain:   MSIR 15 mg twice a day 4. Mood/bipolar disorder: Remeron 30 mg daily, Risperdal 2 mg twice a day, Depakene 500 mg twice a day 5. Neuropsych: This patient iscapable of making decisions on herown behalf. 6. Skin/Wound Care: Routine skin checks 7. Fluids/Electrolytes/Nutrition: Routine I&Os 8.Chronic granulomatous disease.   Continue Bactrim prophylaxis 9.Diabetes mellitus and peripheral neuropathy. Hemoglobin A1c 6.5. Lantus insulin 14 units daily, NovoLog 5 units 3 times a day. Check blood sugars before meals and at bedtime  Relatively controlled 12/5 10.Hypertension. Labetalol 300 mg 3 times a day, . Monitor with increased mobility  Controlled on 12/5 11.Acute on chronic diastolic congestive heart failure. Lasix 40 mg daily. Monitor for any signs of fluid overload   Filed Weights   09/12/16 0500 09/13/16 0344 09/14/16 0510  Weight: 81.6 kg (179 lb 14.3 oz) 82 kg (180 lb 12.4 oz) 83 kg (182 lb 15.7 oz)   12.Hyperlipidemia. Lipitor 13. Urinary incontinence.   UA, C&S  remains pending 12/5 14. ABLA  Hb 9.6 on 12/4  Cont to monitor 15. Hypoalbuminemia  Supplement initiated 12/1 16. HCAP  CXR reviewed, repeat CXR 12/5 reviewed, no interval change - b/l opacities  Sputum culture pending  Cont Vanc, Cefepime   Allergic to fluoroquinolones  Appreciate Pulm recs - autoimmune workup  Overnight 12/4 required Bipap for desats and assessed by rapid response    LOS (Days) 5 A FACE TO FACE EVALUATION WAS PERFORMED  Lauralei Clouse Anil Cherese Lozano 09/14/2016 9:00 AM

## 2016-09-14 NOTE — Progress Notes (Signed)
patient complained  of SOB after AM care, sats 52% 5L [place], pt place on NRB, rapid and resp paged, breathing treatment provided, sat increased to 98%. sats drop when place on [place]. On call paged, she ordered Chest X-ray. And to keep patient on Bipap.  PA dan was also informed. We continue to monitor

## 2016-09-14 NOTE — Progress Notes (Signed)
PULMONARY / CRITICAL CARE MEDICINE   Name: Cheryl Wheeler MRN: [id number] DOB: 1966/02/01    ADMISSION DATE:  09/09/2016  REFERRING MD:  Dr. Posey Pronto   CHIEF COMPLAINT:  Dyspnea  BRIEF:  Cheryl Wheeler is a 50 y.o. 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   On 08/22/2016, patient presented to [facility] with dyspnea, dry cough and subjective fever and hypoxia to 70's. Initial ABG was 7.4/66/45/41 at [facility]. CBC significant for mild leukocytosis. BMP remarkable for hypoosmolar hyponatremia to 128 otherwise within normal limits. UA was remarkable for pyuria but urine culture was negative. She was started on Solu-Medrol, DuoNeb, Pulmicort and guanfacine-DM for COPD exacerbation. However, she continued to have worsening dyspnea with increased oxygen requirement on BiPAP continuously. She was also started on cefepime and vancomycin on 08/24/2016.  On the day of transfer, vitals and was normal limits except for tachypnea to 25, ABG was 7.4/65/75/41/ Last BMP within normal limits. Mild leukocytosis and hyponatremia resolved.   On arrival, patient was on BiPAP with some increased work of breathing. She reported headache at the site where the the BiPAP mask rests on her forehead, denies chest pain, admits shortness of breath, denies abdominal pain, denies dysuria and admits chronic back pain. She reports swelling in the right foot and right calf for about a month prior to presentation to [facility] that has resolved.  Patient likes stated she would want  intubation or trach done.   SIGNIFICANT EVENTS: 11/12 - Admitted to [facility] where his acute on chronic COPD exacerbation and hypoosmolar hyponatremia 11/14 - Started on vancomycin and  cefepime 11/16 - Transferred to [facility] ICU for possible tracheostomy due to continuous need for BiPAP and increased oxygen requirement.  11/17 - Intubated. CT chest negative for PE.  11/18 - Bronch RML, no DAH, some erthyema blood, BAL sent, tolerated 11/24 - extubated  11/26 - TRH primary  11/30 - Transfer to inpatient rehab   SUBJECTIVE: Patient does endorse some increasing dyspnea over the last day or so. Intermittent nonproductive cough. Does also report some intermittent "sharp" chest pain in her left parasternal area. No other chest tightness. Patient is getting out of bed.   VITAL SIGNS: BP 126/66 (BP Location: Right Arm)   Pulse 92   Temp 97.9 F (36.6 C) (Oral)   Resp 20   Ht 4' 11.5" (1.511 m)   Wt 182 lb 15.7 oz (83 kg)   SpO2 91%   BMI 36.34 kg/m   HEMODYNAMICS:    INTAKE / OUTPUT: I/O last 3 completed shifts: In: 840 [P.O.:840] Out: -   PHYSICAL EXAMINATION: General:  Awake. Alert. No acute distress. Laying in bed watching TV.  Integument:  Warm & dry. No rash on exposed skin.  HEENT:  Moist mucus membranes. No scleral injection or icterus.  Cardiovascular:  Regular rate and rhythm. Trace edema. Pulmonary:  Diminished breath sounds bilaterally. Normal work of breathing on nasal cannula at 5 L/m. Speaking in complete sentences. Abdomen: Soft. Normal bowel sounds. Protuberant. Neurological: Alert and oriented 3. No meningismus. Following commands.  LABS:  BMET  Recent Labs Lab 09/08/16 0245 09/09/16 0317 09/10/16 0446  NA 136 137 135  K 4.3 4.0 3.8  CL 93* 92* 88*  CO2 33* 34* 35*  BUN '14 13 14  ' CREATININE  0.50 0.35* 0.50  GLUCOSE 117* 137* 121*    Electrolytes  Recent Labs Lab 09/08/16 0245 09/09/16 0317 09/10/16 0446  CALCIUM 9.3 9.1 9.1    CBC  Recent Labs Lab 09/09/16 0317 09/10/16 0446 09/13/16 1524  WBC 8.8 9.9 5.5  HGB 10.5* 10.5* 9.6*  HCT 35.0* 34.8* 31.8*  PLT 181 157 140*    Coag's No results for input(s): APTT, INR  in the last 168 hours.  Sepsis Markers  Recent Labs Lab 09/13/16 1949  PROCALCITON <0.10    ABG  Recent Labs Lab 09/09/16 0820  PHART 7.452*  PCO2ART 58.0*  PO2ART 60.3*    Liver Enzymes  Recent Labs Lab 09/10/16 0446  AST 14*  ALT 26  ALKPHOS 37*  BILITOT 0.6  ALBUMIN 2.9*    Cardiac Enzymes No results for input(s): TROPONINI, PROBNP in the last 168 hours.  Glucose  Recent Labs Lab 09/13/16 0638 09/13/16 0937 09/13/16 1152 09/13/16 1631 09/13/16 2108 09/14/16 0628  GLUCAP 131* 125* 173* 98 103* 119*    Imaging Dg Chest 2 View  Result Date: 09/13/2016 CLINICAL DATA:  Chest pain EXAM: CHEST  2 VIEW COMPARISON:  09/09/2016 FINDINGS: Multifocal patchy opacities in the bilateral upper lobes and left lower lobe, suspicious for pneumonia. No pleural effusion or pneumothorax. The heart is top-normal in size. Visualized osseous structures are within normal limits. IMPRESSION: Multifocal patchy opacities in the bilateral upper lobes and left lower lobe, suspicious for pneumonia. Electronically Signed   By: Julian Hy M.D.   On: 09/13/2016 11:46   Dg Chest Port 1 View  Result Date: 09/14/2016 CLINICAL DATA:  Increasing dyspnea this morning EXAM: PORTABLE CHEST 1 VIEW COMPARISON:  09/13/2016 FINDINGS: Multifocal patchy opacities persist bilaterally without significant interval change. No large effusion. Normal pulmonary vasculature. Hilar and mediastinal contours are unremarkable and unchanged. IMPRESSION: Multifocal airspace opacities persist bilaterally without significant interval change. Electronically Signed   By: Andreas Newport M.D.   On: 09/14/2016 06:04     STUDIES:  Venous Duplex 11/16: negative for DVT  Bronch 11/18: RML, no DAH, some erythema blood rml  CXR PA/LAT 12/4: . Patchy bilateral opacities. These may have been present to some degree on imaging from her chest x-ray on 11/30. No obvious consolidation or air bronchograms. No pleural  effusion. Marked kyphosis. Heart normal in size and mediastinum normal in contour.  MICROBIOLOGY: Culture at OSH negative MRSA PCR 11/16:  Negative Urine Ctx 11/17:  Negative BAL RML 11/18:  Normal Flora Urine Ctx 11/22:  10,000 colonies yeast Urine Streptococcus 12/4>> Urine Legionella 12/4/>>  ANTIBIOTICS: Rocephin 11/21 - 11/24 Bactrim BID 11/30 >> Cefepime 11/14 - 11/16; 12/ 4 >> Vancomycin 11/14 - 11/16; 12/4 >>  SEROLOGIES: 08/2016 Alpha-1 antitrypsin: 120 ANA: Negative Rheumatoid factor: 10.6 C3: 103 C4: 8 ESR 12/4 >> 70 CRP 12/4>> 16.2 DS DNA 12/4>> Histone AB 12/4/>> Smith AB 12/4>>  LINES/TUBES: PIV  ASSESSMENT / PLAN:   50 y.o. female with history of COPD and chronic hypoxic respiratory failure on 2 L/m at home chronically. Patient also utilizes noninvasive positive pressure ventilation at home. Previous history of chronic granulomatous disease. Developing acute on chronic hypoxic respiratory failure. Multifocal opacities could certainly represent a developing multifocal pneumonia. Remains afebrile with no elevation in WBC. As there are   no  other infectious symptoms,  alternative etiologies must be considered.  1. Acute on chronic hypoxic respiratory failure: Recommend continuing to wean FiO2 for saturations of 88-92% & encourage incentive spirometry and out of  bed to minimize atelectasis. 2. Continued Bilateral lung opacities: Pending serum autoimmune workup with ESR, CRP, double-stranded DNA antibody, histone antibody, and Smith antibody. Will order   CT chest without contrast if serologic workup is unrevealing. 3. Possible HCAP: Continue  broad-spectrum antibiotics with vancomycin and cefepime. Trending Procalcitonin per algorithm with low threshold to de-escalate antibiotics. Pending  urine streptococcal and legionella antigens.        OOB to chair, IS q 1  While awake. 4.  COPD: No signs of exacerbation. Continuing Pulmicort nebulized twice a day & DuoNeb  nebulized 4 times a day.   Discussion: Pt. Had an episode of desaturation into the 50"s ( per nursing it was sustained) last PM with rolling to clean patient up..Rapid Response RN was notified. PA and MD assessed patient . CXR was done which indicated Multi focal opacities bilaterally without significant interval change. She was placed back on BiPAP and then this morning on 5 L [place]. Baseline home oxygen is  2 L.This morning she is lethargic and on 5 L saturations are 98%. I have asked the nursing staff to wean oxygen for saturation of 88-92%. I told them 98% was too high for this patient with COPD. Additionally her husband who is at bedside states that this patient is followed by Dr. Harrietta Guardian, Immunologist at [facility] for autosomal recessive chronic granulomatous disease. She was taking bactrim BID chronically. He states that the patient had  been taking a medication called Itraconazole until last year when it got too costly for them to continue. Since she has been off this medication, she has had multiple pneumonias. Per the husband, starting Jan. 1st, the medication will only cost them $99 per month and they hope to start it back after the new year.  We will await additional lab results and will order  CT without contrast with hight resolution cuts. Consider restarting Itraconazole.  Remainder of care as per primary service.  Magdalen Spatz, AGACNP-BC [facility] Pulmonary/Critical Care Medicine Pager # [phone] 10:50 AM 09/14/16  Attending Note:  I have examined patient, reviewed labs, studies and notes. I have discussed the case with Gladstone Pih, and I agree with the data and plans as amended above. Pt with hx autosomal recessive chronic granulomatous disease, not currently on itraconazole or bactrim. On O2 and nocturnal BiPAP. Admitted and treated for HCAP and resp failure. Improved and was in rehab, but now progressive infiltrates and hypoxemia. Certainly at risk for recurrent  HCAp or atypical infxn, but no associated symptoms to support this . Consider non-infectious pneumonitis, other opportunistic process. Auto-immune labs are pending. Will continue abx and plan to repeat her Ct chest to look for interval change. She may need FOB for cx data and also bx's. Will follow results and tailor plans accordingly.     Baltazar Apo, MD, PhD 09/14/2016, 1:27 PM [facility] [phone] or if no answer [phone]

## 2016-09-15 ENCOUNTER — Inpatient Hospital Stay (HOSPITAL_COMMUNITY): Payer: Medicare HMO | Admitting: Occupational Therapy

## 2016-09-15 ENCOUNTER — Inpatient Hospital Stay (HOSPITAL_COMMUNITY): Payer: Medicare HMO | Admitting: Physical Therapy

## 2016-09-15 LAB — ANTI-SMITH ANTIBODY

## 2016-09-15 LAB — URINALYSIS, ROUTINE W REFLEX MICROSCOPIC
BILIRUBIN URINE: NEGATIVE
Bacteria, UA: NONE SEEN
Glucose, UA: NEGATIVE mg/dL
HGB URINE DIPSTICK: NEGATIVE
KETONES UR: NEGATIVE mg/dL
NITRITE: NEGATIVE
PH: 7 (ref 5.0–8.0)
Protein, ur: NEGATIVE mg/dL
SPECIFIC GRAVITY, URINE: 1.017 (ref 1.005–1.030)

## 2016-09-15 LAB — URINE CULTURE: CULTURE: NO GROWTH

## 2016-09-15 LAB — GLUCOSE, CAPILLARY
GLUCOSE-CAPILLARY: 223 mg/dL — AB (ref 65–99)
Glucose-Capillary: 108 mg/dL — ABNORMAL HIGH (ref 65–99)
Glucose-Capillary: 85 mg/dL (ref 65–99)
Glucose-Capillary: 98 mg/dL (ref 65–99)

## 2016-09-15 LAB — ANTI-DNA ANTIBODY, DOUBLE-STRANDED: ds DNA Ab: <1 IU/mL (ref 0–9)

## 2016-09-15 LAB — PROCALCITONIN: Procalcitonin: <0.1 ng/mL

## 2016-09-15 MED ORDER — MORPHINE SULFATE 15 MG PO TABS
15.0000 mg | ORAL_TABLET | Freq: Two times a day (BID) | ORAL | Status: DC | PRN
  Administered 2016-09-15 – 2016-09-17 (×4): 15 mg via ORAL
  Filled 2016-09-15 (×4): qty 1

## 2016-09-15 MED ORDER — RISPERIDONE 1 MG PO TABS
2.0000 mg | ORAL_TABLET | Freq: Two times a day (BID) | ORAL | Status: DC
  Administered 2016-09-15 – 2016-09-17 (×5): 2 mg via ORAL
  Filled 2016-09-15 (×5): qty 2

## 2016-09-15 MED ORDER — FLUCONAZOLE 100 MG PO TABS
100.0000 mg | ORAL_TABLET | Freq: Every day | ORAL | Status: DC
  Administered 2016-09-15 – 2016-09-16 (×2): 100 mg via ORAL
  Filled 2016-09-15 (×2): qty 1

## 2016-09-15 NOTE — Progress Notes (Signed)
Occupational Therapy Session Note  Patient Details  Name: Cheryl Wheeler MRN: [id number] Date of Birth: 1965/11/27  Today's Date: 09/15/2016 OT Individual Time: [id number] and [id number] OT Individual Time Calculation (min): 71 min and 54min    Short Term Goals: Week 1:  OT Short Term Goal 1 (Week 1): Pt will complete bathing with Min A and AE PRN OT Short Term Goal 2 (Week 1): Pt will complete LB dressing with Mod A  OT Short Term Goal 3 (Week 1): Pt will stand for 3 minutes while engaged in unilaterally involved task to increase independence with LB self care OT Short Term Goal 4 (Week 1): Pt will complete BSC transfer and LRAD with Mod A  Skilled Therapeutic Interventions/Progress Updates:     Session 1: Upon entering the room, pt seated in wheelchair with husband present in room. O2 assess multiples times throughout session and pt remained at 92-94% throughout session on 4 L O2 via [place]. Pt declined toileting this session. Pt requesting to change shirt and encouraged to wash UB. Pt seated in wheelchair at sink for UB bathing with set up A and increased time as pt fatigues easily throughout session. Pt donning shirt with set up A to obtain. Once dressed, pt engaged in B UE strengthening exercises with use of orange, level 2 theraband. OT demonstrated exercises for bicep curls, chest pulls, shoulder diagonals, and shoulder flex/ext. Pt returned demonstrations with min verbal cues and assist for proper technique.   Session 2: Upon entering the room, pt supine in bed and reports, "I used the bathroom on myself." Pt was incontinent in brief prior to OT arrival. Pt rolling 6 times L <> R with use of bedrail and min A for clothing management and hygiene. Pt reports extreme fatigue and O2 sats at 3 L via [place] are 85 % with HR at 103. Oxygen increased to 4 L but after 90 seconds oxygen remains at 85%. Rn notified O2 moved to 5 L and O2 ranging from 87-90% throughout session.  RN reports giving medication  for anxiety prior to OT arrival as well. Pt performed supine >sit with min A to EOB. Pt sitting on EOB with close supervision for balance while performing table top activity with R UE. Pt taking frequent rest breaks and min verbal cues for pursed lip breathing. Pt returning to supine at end of session with mod A for B LEs to return to bed. Call bell and all needed items within reach. RN notified of O2 saturations.     Therapy Documentation Precautions:  Precautions Precautions: Fall Precaution Comments: 5L 02 via nasal cannula  Restrictions Weight Bearing Restrictions: No General: General PT Missed Treatment Reason: Unavailable (Comment) (eating breakfast and RT care) Vital Signs: Oxygen Therapy SpO2: (!) 86 % O2 Device: Nasal Cannula O2 Flow Rate (L/min): 4 L/min Pain: Pain Assessment Pain Assessment: Faces Faces Pain Scale: Hurts even more Pain Type: Acute pain Pain Location: Back Pain Orientation: Mid;Lower Pain Descriptors / Indicators: Aching;Sore Pain Onset: On-going Patients Stated Pain Goal: 4 Pain Intervention(s): Other (Comment) (pre-medicated) ADL: ADL ADL Comments: Please see functional navigator for ADL status Exercises:   Other Treatments:    See Function Navigator for Current Functional Status.   Therapy/Group: Individual Therapy  [name], Marnesha Gagen P 09/15/2016, 10:43 AM

## 2016-09-15 NOTE — Progress Notes (Signed)
Physical Therapy Session Note  Patient Details  Name: Cheryl NissenJonce Ann Perman MRN: [id number] Date of Birth: 1966-08-06  Today's Date: 09/15/2016 PT Individual Time: 0815-0900 PT Individual Time Calculation (min): 45 min    Short Term Goals: Week 1:  PT Short Term Goal 1 (Week 1): Pt will perform bed mobility with HOB elevated with min A PT Short Term Goal 2 (Week 1): Pt will perform sit <> stand and stand pivot with RW and mod A PT Short Term Goal 3 (Week 1): Pt will perform w/c mobility x 100' with supervision for UE strengthening and endurance PT Short Term Goal 4 (Week 1): Pt will perform gait x 25' with RW and mod A PT Short Term Goal 5 (Week 1): Pt will negotiate 2 stairs with 2 rails and mod A  Skilled Therapeutic Interventions/Progress Updates:   Pt received seated in bed, requests to complete breakfast and RT present for treatment; missed 15 min PT. Upon therapist's return, pt c/o pain in back as below and agreeable to treatment. Supine>sit with S and bedrails, HOB elevated. Seated on EOB, O2 assessed on 4L O2 via [place]; 89% after bed mobility. Educated pt in use of biofeedback and pursed lip breathing to increase O2; able to increase to 91% after 2 min. Pt threaded RLE into pants, required assist for LLE. Sit <>stand in stedy with S after pt declined standing without stedy, and therapist pulled up pants while pt maintained balance with BUE support on stedy. Transfer >w/c with totalA using stedy. Pt seated at sink performed oral hygiene and washing face with setupA. W/c propulsion 2x60' with BUE and S; slow speed and min cues for hand placement for efficient propulsion and shoulder integrity. O2 dropped to 88-89% after each trial of w/c propulsion; biofeedback again used to cue in breathing strategies to increase O2 back to baseline. Pt remained seated in w/c, 4L O2 via [place] on wall source per RN instruction, all needs in reach.   Therapy Documentation Precautions:  Precautions Precautions:  Fall Precaution Comments: 5L 02 via nasal cannula  Restrictions Weight Bearing Restrictions: No General: PT Amount of Missed Time (min): 15 Minutes PT Missed Treatment Reason: Unavailable (Comment) (eating breakfast and RT care) Pain: Pain Assessment Pain Assessment: Faces Faces Pain Scale: Hurts even more Pain Type: Acute pain Pain Location: Back Pain Orientation: Mid;Lower Pain Descriptors / Indicators: Aching;Sore Pain Onset: On-going Patients Stated Pain Goal: 4 Pain Intervention(s): Other (Comment) (pre-medicated)   See Function Navigator for Current Functional Status.   Therapy/Group: Individual Therapy  Vista Lawmanlizabeth J Tygielski 09/15/2016, 9:13 AM

## 2016-09-15 NOTE — Progress Notes (Signed)
Pt incontinent large amt urine; PVR scan = 80cc.

## 2016-09-15 NOTE — Progress Notes (Addendum)
[place] PHYSICAL MEDICINE & REHABILITATION     PROGRESS NOTE  Subjective/Complaints:  Pt sitting up in bed this AM.  She slept well.  She was noted to be lethargic during therapies yesterday and on AM eval and narcotics were d/ced.  The only request she has this AM is that her narcotics be resumed.  She states she is always tired at baseline.    ROS: Denies CP, N/V/D.  Objective: Vital Signs: Blood pressure 121/64, pulse 80, temperature 98.8 F (37.1 C), temperature source Oral, resp. rate 18, height 4' 11.5" (1.511 m), weight 83.4 kg (183 lb 13.8 oz), SpO2 (!) 86 %. Dg Chest 2 View  Result Date: 09/13/2016 CLINICAL DATA:  Chest pain EXAM: CHEST  2 VIEW COMPARISON:  09/09/2016 FINDINGS: Multifocal patchy opacities in the bilateral upper lobes and left lower lobe, suspicious for pneumonia. No pleural effusion or pneumothorax. The heart is top-normal in size. Visualized osseous structures are within normal limits. IMPRESSION: Multifocal patchy opacities in the bilateral upper lobes and left lower lobe, suspicious for pneumonia. Electronically Signed   By: Charline BillsSriyesh  Krishnan M.D.   On: 09/13/2016 11:46   Ct Chest Wo Contrast  Result Date: 09/14/2016 CLINICAL DATA:  Acute on chronic respiratory failure. EXAM: CT CHEST WITHOUT CONTRAST TECHNIQUE: Multidetector CT imaging of the chest was performed following the standard protocol without IV contrast. COMPARISON:  08/27/2016.  06/22/2016 FINDINGS: Cardiovascular: The heart size is normal. No pericardial effusion. There is abdominal aortic atherosclerosis without aneurysm. Mediastinum/Nodes: Scattered mediastinal lymph nodes again noted, slightly increased in the interval. 7 mm short axis prevascular lymph node today was 6 mm previously. 8 mm short axis precarinal lymph node was not well seen on prior study. 12 mm short axis subcarinal lymph node is not substantially changed. Hilar regions not well evaluated without intravenous contrast material. The  esophagus has normal imaging features. There is no axillary lymphadenopathy. Lungs/Pleura: Interval development of multiple patchy areas of confluent airspace consolidation in both upper lobes in the left lower lobe. In the posterior left lower lobe, there is also new micro nodularity in a peribronchovascular distribution, just inferior to the confluent airspace disease. Changes of underlying emphysema again noted with bronchial wall thickening. A degree of mosaic ground-glass attenuation is seen in the lung apices, as before. Upper Abdomen: Unremarkable. Musculoskeletal: Bone windows reveal no worrisome lytic or sclerotic osseous lesions. IMPRESSION: 1. Interval development of peripheral areas of confluent airspace consolidation in the upper lobes and left lower lobe. Differential considerations would include multifocal pneumonia, cryptogenic pneumonia and inflammatory etiology. Pattern of airspace disease is not as round and nodular as typically seen for septic emboli. 2. Interval increase and mediastinal lymph nodes, now borderline enlarged. These may be reactive. Electronically Signed   By: Kennith CenterEric  Mansell M.D.   On: 09/14/2016 20:21   Dg Chest Port 1 View  Result Date: 09/14/2016 CLINICAL DATA:  Increasing dyspnea this morning EXAM: PORTABLE CHEST 1 VIEW COMPARISON:  09/13/2016 FINDINGS: Multifocal patchy opacities persist bilaterally without significant interval change. No large effusion. Normal pulmonary vasculature. Hilar and mediastinal contours are unremarkable and unchanged. IMPRESSION: Multifocal airspace opacities persist bilaterally without significant interval change. Electronically Signed   By: Ellery Plunkaniel R Mitchell M.D.   On: 09/14/2016 06:04    Recent Labs  09/13/16 1524  WBC 5.5  HGB 9.6*  HCT 31.8*  PLT 140*   No results for input(s): NA, K, CL, GLUCOSE, BUN, CREATININE, CALCIUM in the last 72 hours.  Invalid input(s): CO CBG (last 3)  Recent Labs  09/14/16 1638 09/14/16 2140  09/15/16 0652  GLUCAP 100* 109* 108*    Wt Readings from Last 3 Encounters:  09/15/16 83.4 kg (183 lb 13.8 oz)  09/09/16 81.8 kg (180 lb 5.4 oz)    Physical Exam:  BP 121/64 (BP Location: Left Arm)   Pulse 80   Temp 98.8 F (37.1 C) (Oral)   Resp 18   Ht 4' 11.5" (1.511 m)   Wt 83.4 kg (183 lb 13.8 oz)   SpO2 (!) 86%   BMI 36.51 kg/m  Constitutional: She appears well-developed. NAD.  HENT: Normocephalic. Atraumatic. Eyes: EOMare normal. No discharge.  Cardiovascular: RRR. No JVD. Respiratory: Upper airway sounds. Increased WOB. + GI: Soft. Bowel sounds are normal.   Neurological: She is alertand oriented. Motor: B/l UE: 4-/5 deltoid, 4/5 bicep, tricep and 4/5 wrist and HI (unchanged).  B/l LE: 2+/5 HF, 3/5 KE and 2+/5 ADF/PF (stable).  Skin: Skin is warmand dry.  Psychiatric: She has a normal mood and affect. Her behavior is normal  Assessment/Plan: 1. 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 Physiatrist is providing close team supervision and 24 hour management of active medical problems listed below. Physiatrist and rehab team continue to assess barriers to discharge/monitor patient progress toward functional and medical goals.  Function:  Bathing Bathing position   Position: Wheelchair/chair at sink  Bathing parts Body parts bathed by patient: Right arm, Left arm, Chest, Abdomen, Front perineal area, Right upper leg, Left upper leg, Right lower leg, Left lower leg Body parts bathed by helper: Buttocks, Back  Bathing assist Assist Level: Touching or steadying assistance(Pt > 75%)      Upper Body Dressing/Undressing Upper body dressing   What is the patient wearing?: Pull over shirt/dress     Pull over shirt/dress - Perfomed by patient: Thread/unthread right sleeve, Thread/unthread left sleeve, Put head  through opening, Pull shirt over trunk          Upper body assist Assist Level: Supervision or verbal cues      Lower Body Dressing/Undressing Lower body dressing   What is the patient wearing?: Underwear, Pants, Non-skid slipper socks Underwear - Performed by patient: Thread/unthread left underwear leg, Thread/unthread right underwear leg Underwear - Performed by helper: Pull underwear up/down Pants- Performed by patient: Thread/unthread right pants leg, Thread/unthread left pants leg Pants- Performed by helper: Pull pants up/down   Non-skid slipper socks- Performed by helper: Don/doff right sock, Don/doff left sock                  Lower body assist Assist for lower body dressing:  (Mod A)      Toileting Toileting   Toileting steps completed by patient: Adjust clothing prior to toileting Toileting steps completed by helper: Adjust clothing prior to toileting, Performs perineal hygiene, Adjust clothing after toileting Toileting Assistive Devices: Grab bar or rail  Toileting assist     Transfers Chair/bed transfer   Chair/bed transfer method: Squat pivot Chair/bed transfer assist level: Moderate assist (Pt 50 - 74%/lift or lower) Chair/bed transfer assistive device: Armrests     Locomotion Ambulation     Max distance: 3 Assist level: Moderate assist (Pt 50 - 74%)   Wheelchair   Type: Manual Max wheelchair distance: 80 ft Assist Level: Supervision or verbal cues  Cognition Comprehension Comprehension assist level: Understands basic 50 - 74% of the time/ requires cueing  25 - 49% of the time  Expression Expression assist level: Expresses basic 50 - 74% of the time/requires cueing 25 - 49% of the time. Needs to repeat parts of sentences.  Social Interaction Social Interaction assist level: Interacts appropriately 50 - 74% of the time - May be physically or verbally inappropriate.  Problem Solving Problem solving assist level: Solves basic 50 - 74% of the  time/requires cueing 25 - 49% of the time  Memory Memory assist level: Recognizes or recalls 90% of the time/requires cueing < 10% of the time    Medical Problem List and Plan: 1. 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 Oxygen dependent prior to admission with BiPAP at home  Cont CIR 2. DVT Prophylaxis/Anticoagulation: SCDs.  3. Pain Management/chronic back pain:   MSIR 15 mg twice a day changed to PRN 4. Mood/bipolar disorder:   Remeron 30 mg daily,   Risperdal 2 mg twice a day,   Depakene 500 mg twice a day 5. Neuropsych: This patient iscapable of making decisions on herown behalf. 6. Skin/Wound Care: Routine skin checks 7. Fluids/Electrolytes/Nutrition: Routine I&Os 8.Chronic granulomatous disease.   Continue Bactrim prophylaxis 9.Diabetes mellitus and peripheral neuropathy. Hemoglobin A1c 6.5. Lantus insulin 14 units daily, NovoLog 5 units 3 times a day. Check blood sugars before meals and at bedtime  Relatively controlled 12/6 10.Hypertension. Labetalol 300 mg 3 times a day, . Monitor with increased mobility  Controlled on 12/6 11.Acute on chronic diastolic congestive heart failure. Lasix 40 mg daily. Monitor for any signs of fluid overload   Filed Weights   09/13/16 0344 09/14/16 0510 09/15/16 0500  Weight: 82 kg (180 lb 12.4 oz) 83 kg (182 lb 15.7 oz) 83.4 kg (183 lb 13.8 oz)   12.Hyperlipidemia. Lipitor 13. Urinary incontinence.   UA neg, C&S pending 12/6  PVRs ordered 14. ABLA  Hb 9.6 on 12/4  Cont to monitor 15. Hypoalbuminemia  Supplement initiated 12/1 16. HCAP  CXR reviewed, repeat CXR 12/5 reviewed, no interval change - b/l opacities  CT chest 12/5 reviewed, opacities B/l upper lobes  Sputum culture pending  Cont Vanc, Cefepime   Allergic to fluoroquinolones  Appreciate Pulm recs - autoimmune workup  12/4 required Bipap for desats and assessed by rapid response  LOS (Days)  6 A FACE TO FACE EVALUATION WAS PERFORMED  Mahreen Schewe Karis Jubanil Rindi Beechy 09/15/2016 8:33 AM

## 2016-09-15 NOTE — Progress Notes (Signed)
PULMONARY / CRITICAL CARE MEDICINE   Name: Cheryl Wheeler MRN: [id number] DOB: [birth year]    ADMISSION DATE:  09/09/2016  REFERRING MD:  Dr. Posey Pronto   CHIEF COMPLAINT:  Dyspnea  BRIEF:  Cheryl Wheeler is a 50 y.o. female with ?end-stage COPD on 3 L oxygen and BiPAP at home, chronic granulomatous disease, hypertension, bipolar disorder, anxiety and GERD who transfered from [facility] for possible tracheostomy after needing continuous positive pressure ventilation with BiPAP for 3 days.   On 08/22/2016, patient presented to [facility] with dyspnea, dry cough and subjective fever and hypoxia to 70's. Initial ABG was 7.4/66/45/41 at [facility]. CBC significant for mild leukocytosis. BMP remarkable for hypoosmolar hyponatremia to 128 otherwise within normal limits. UA was remarkable for pyuria but urine culture was negative. She was started on Solu-Medrol, DuoNeb, Pulmicort and guanfacine-DM for COPD exacerbation. However, she continued to have worsening dyspnea with increased oxygen requirement on BiPAP continuously. She was also started on cefepime and vancomycin on 08/24/2016.  On the day of transfer, vitals and was normal limits except for tachypnea to 25, ABG was 7.4/65/75/41/ Last BMP within normal limits. Mild leukocytosis and hyponatremia resolved.   On arrival, patient was on BiPAP with some increased work of breathing. She reported headache at the site where the the BiPAP mask rests on her forehead, denies chest pain, admits shortness of breath, denies abdominal pain, denies dysuria and admits chronic back pain. She reports swelling in the right foot and right calf for about a month prior to presentation to [facility] that has resolved.   SIGNIFICANT EVENTS: 11/12 - Admitted to [facility] where his acute on chronic COPD exacerbation and hypoosmolar hyponatremia 11/14 - Started on vancomycin and cefepime 11/16 - Transferred to [facility] ICU for possible tracheostomy  due to continuous need for BiPAP and increased oxygen requirement.  11/17 - Intubated. CT chest negative for PE.  11/18 - Bronch RML, no DAH, some erthyema blood, BAL sent, tolerated 11/24 - extubated  11/26 - TRH primary  11/30 - Transfer to inpatient rehab   SUBJECTIVE:  Setting in wheelchair. No acute events.   VITAL SIGNS: BP 121/64 (BP Location: Left Arm)   Pulse 80   Temp 98.8 F (37.1 C) (Oral)   Resp 18   Ht 4' 11.5" (1.511 m)   Wt 83.4 kg (183 lb 13.8 oz)   SpO2 (!) 86%   BMI 36.51 kg/m   HEMODYNAMICS:    INTAKE / OUTPUT: I/O last 3 completed shifts: In: 25 [P.O.:720] Out: -   PHYSICAL EXAMINATION: General:  Adult female, in wheelchair, no distress  Integument:  Warm & dry, intact  HEENT:  Normocephalic.  Cardiovascular:  RRR, no MRG. Trace edema. Pulmonary:  Diminished breath sounds bilaterally. Normal work of breathing on nasal cannula at 4 L/m. Speaking in complete sentences. Abdomen: Soft. Non-tender, active bowel sounds.  Neurological: Alert and oriented, Following commands.  LABS:  BMET  Recent Labs Lab 09/09/16 0317 09/10/16 0446  NA 137 135  K 4.0 3.8  CL 92* 88*  CO2 34* 35*  BUN 13 14  CREATININE 0.35* 0.50  GLUCOSE 137* 121*    Electrolytes  Recent Labs Lab 09/09/16 0317 09/10/16 0446  CALCIUM 9.1 9.1    CBC  Recent Labs Lab 09/09/16 0317 09/10/16 0446 09/13/16 1524  WBC 8.8 9.9 5.5  HGB 10.5* 10.5* 9.6*  HCT 35.0* 34.8* 31.8*  PLT 181 157 140*    Coag's No results for input(s): APTT, INR in  the last 168 hours.  Sepsis Markers  Recent Labs Lab 09/13/16 1949 09/15/16 0633  PROCALCITON <0.10 <0.10    ABG  Recent Labs Lab 09/09/16 0820  PHART 7.452*  PCO2ART 58.0*  PO2ART 60.3*    Liver Enzymes  Recent Labs Lab 09/10/16 0446  AST 14*  ALT 26  ALKPHOS 37*  BILITOT 0.6  ALBUMIN 2.9*    Cardiac Enzymes No results for input(s): TROPONINI, PROBNP in the last 168 hours.  Glucose  Recent  Labs Lab 09/13/16 2108 09/14/16 0628 09/14/16 1126 09/14/16 1638 09/14/16 2140 09/15/16 0652  GLUCAP 103* 119* 133* 100* 109* 108*    Imaging Ct Chest Wo Contrast  Result Date: 09/14/2016 CLINICAL DATA:  Acute on chronic respiratory failure. EXAM: CT CHEST WITHOUT CONTRAST TECHNIQUE: Multidetector CT imaging of the chest was performed following the standard protocol without IV contrast. COMPARISON:  08/27/2016.  06/22/2016 FINDINGS: Cardiovascular: The heart size is normal. No pericardial effusion. There is abdominal aortic atherosclerosis without aneurysm. Mediastinum/Nodes: Scattered mediastinal lymph nodes again noted, slightly increased in the interval. 7 mm short axis prevascular lymph node today was 6 mm previously. 8 mm short axis precarinal lymph node was not well seen on prior study. 12 mm short axis subcarinal lymph node is not substantially changed. Hilar regions not well evaluated without intravenous contrast material. The esophagus has normal imaging features. There is no axillary lymphadenopathy. Lungs/Pleura: Interval development of multiple patchy areas of confluent airspace consolidation in both upper lobes in the left lower lobe. In the posterior left lower lobe, there is also new micro nodularity in a peribronchovascular distribution, just inferior to the confluent airspace disease. Changes of underlying emphysema again noted with bronchial wall thickening. A degree of mosaic ground-glass attenuation is seen in the lung apices, as before. Upper Abdomen: Unremarkable. Musculoskeletal: Bone windows reveal no worrisome lytic or sclerotic osseous lesions. IMPRESSION: 1. Interval development of peripheral areas of confluent airspace consolidation in the upper lobes and left lower lobe. Differential considerations would include multifocal pneumonia, cryptogenic pneumonia and inflammatory etiology. Pattern of airspace disease is not as round and nodular as typically seen for septic emboli.  2. Interval increase and mediastinal lymph nodes, now borderline enlarged. These may be reactive. Electronically Signed   By: Misty Stanley M.D.   On: 09/14/2016 20:21     STUDIES:  Venous Duplex 11/16: negative for DVT  Bronch 11/18: RML, no DAH, some erythema blood rml  CXR PA/LAT 12/4: . Patchy bilateral opacities. These may have been present to some degree on imaging from her chest x-ray on 11/30. No obvious consolidation or air bronchograms. No pleural effusion. Marked kyphosis. Heart normal in size and mediastinum normal in contour. CT Chest 12/6 > focal consolidation in upper lobes and left lower lobe. Increase interval and mediastinal lymph nodes, borderline enlarged, maybe reactive   MICROBIOLOGY: Culture at OSH negative MRSA PCR 11/16:  Negative Urine Ctx 11/17:  Negative BAL RML 11/18:  Normal [place] Urine Ctx 11/22:  10,000 colonies yeast Urine Streptococcus 12/4>> neg  Urine Legionella 12/4/>>  ANTIBIOTICS: Rocephin 11/21 - 11/24 Bactrim BID 11/30 >> Cefepime 11/14 - 11/16; 12/ 4 >> Vancomycin 11/14 - 11/16; 12/4 >>  SEROLOGIES: 08/2016 Alpha-1 antitrypsin: 120 ANA: Negative Rheumatoid factor: 10.6 C3: 103 C4: 8 ESR 12/4 >> 70 CRP 12/4>> 16.2 (elevated) DS DNA 12/4>> negative Histone AB 12/4/>> Smith AB 12/4>> negative  LINES/TUBES: PIV  followed by Dr. Harrietta Guardian, Immunologist at [facility] for autosomal recessive chronic granulomatous disease. She  was taking bactrim BID chronically. He states that the patient had  been taking Itraconazole until last year when it got too costly for them to continue. Since she has been off this medication, she has had multiple pneumonias. Per the husband, starting Jan. 1st, the medication will only cost them $99 per month and they hope to start it back after the new year.   ASSESSMENT / PLAN:   50 y.o. female with history of COPD and chronic hypoxic respiratory failure on 2 L/m at home chronically. Being  followed by [facility] with Dr. Alcide Clever. Patient also utilizes nocturnal noninvasive positive pressure ventilation at home. Previous history of chronic granulomatous disease. Developing acute on chronic hypoxic respiratory failure. Multifocal opacities could certainly represent a developing multifocal pneumonia. Remains afebrile with no elevation in WBC. As there are no other infectious symptoms,  alternative etiologies must be considered.  1. Acute on chronic hypoxic respiratory failure: Recommend continuing to wean FiO2 for saturations of 88-92% & encourage incentive spirometry and out of bed to minimize atelectasis. - OOB to chair, IS q1H while awake. BiPAp at night.  2. Continued Bilateral lung opacities: clinical hx suggestive of non-infectious process although her immune responses may be blunted. Serum auto-immune eval negative so far.   3. Possible HCAP or other opportunistic infectious process; at risk for fungal, actino, nocardia, etc: Continue  broad-spectrum antibiotics with vancomycin and cefepime and consider adding back anti-fungal coverage. Procal negative, WBC WNL, remains afebrile. This does not seem to be an infectious process. Consider this may be an autoimmune granuloma (which is a non-infectious process seen in CGD).  Consider bronchoscopy for micro data depending on response to therapy.  4. COPD: No signs of exacerbation. Continuing Pulmicort nebulized twice a day & DuoNeb nebulized 4 times a day.   Baltazar Apo, MD, PhD 09/15/2016, 2:59 PM [facility] [phone] or if no answer [phone]

## 2016-09-15 NOTE — Progress Notes (Signed)
Spoke with Dr. [facility] in regards to latest chest x-ray and advised to continue vancomycin, Maxipime with the addition of fluconazole 8 days total and stop.

## 2016-09-16 ENCOUNTER — Inpatient Hospital Stay (HOSPITAL_COMMUNITY): Payer: Medicare HMO

## 2016-09-16 ENCOUNTER — Inpatient Hospital Stay (HOSPITAL_COMMUNITY): Payer: Medicare HMO | Admitting: Occupational Therapy

## 2016-09-16 ENCOUNTER — Inpatient Hospital Stay (HOSPITAL_COMMUNITY): Payer: Medicare HMO | Admitting: Physical Therapy

## 2016-09-16 LAB — BASIC METABOLIC PANEL WITH GFR
Anion gap: 13 (ref 5–15)
BUN: 8 mg/dL (ref 6–20)
CO2: 35 mmol/L — ABNORMAL HIGH (ref 22–32)
Calcium: 8.7 mg/dL — ABNORMAL LOW (ref 8.9–10.3)
Chloride: 88 mmol/L — ABNORMAL LOW (ref 101–111)
Creatinine, Ser: 0.42 mg/dL — ABNORMAL LOW (ref 0.44–1.00)
GFR calc Af Amer: >60 mL/min
GFR calc non Af Amer: >60 mL/min
Glucose, Bld: 109 mg/dL — ABNORMAL HIGH (ref 65–99)
Potassium: 3.4 mmol/L — ABNORMAL LOW (ref 3.5–5.1)
Sodium: 136 mmol/L (ref 135–145)

## 2016-09-16 LAB — GLUCOSE, CAPILLARY
Glucose-Capillary: 103 mg/dL — ABNORMAL HIGH (ref 65–99)
Glucose-Capillary: 85 mg/dL (ref 65–99)
Glucose-Capillary: 99 mg/dL (ref 65–99)
Glucose-Capillary: 76 mg/dL (ref 65–99)

## 2016-09-16 LAB — HISTONE ANTIBODIES, IGG, BLOOD: DNA-Histone: 0.3 Units (ref 0.0–0.9)

## 2016-09-16 LAB — VANCOMYCIN, TROUGH: Vancomycin Tr: 8 ug/mL — ABNORMAL LOW (ref 15–20)

## 2016-09-16 MED ORDER — VANCOMYCIN HCL IN DEXTROSE 1-5 GM/200ML-% IV SOLN
1000.0000 mg | Freq: Three times a day (TID) | INTRAVENOUS | Status: DC
Start: 2016-09-16 — End: 2016-09-17
  Administered 2016-09-16 – 2016-09-17 (×3): 1000 mg via INTRAVENOUS
  Filled 2016-09-16 (×5): qty 200

## 2016-09-16 NOTE — Progress Notes (Addendum)
Physical Therapy Note  Patient Details  Name: Cheryl Wheeler MRN: [id number] Date of Birth: 05/05/1966 Today's Date: 09/16/2016  tx 1:  1030-1100, 30 min individual tx Pain: none reported  Pt lying supine in bed at 40 degrees HOB, on 4L O2 via [place] .  O2 sats via [place] remained 90-92% throughout session.  Therapeutic exercise performed with LE to increase strength for functional mobility: 1 x 10 each: R/L: short arc quad knee ext, assisted straight leg raise, 2 x 10 resisted PF, DF, bil hip internal rotation; bil hip abduction against orange Theraband resistance. Pt encouraged to count aloud during exs for breath control.  She performed exs wth brief rest breaks between sets, with cues to do pursed lip breathing at that time. She tolerated exs well.  Pt left resting in bed with alarm set and all needs within reach.  tx 2:  1430-1530, 60 min individual tx Pain: 6/10 low back; medicated during tx and footrests built up to improve hip and knee angles and reduce strain on low back  Seated bil hip adduction and internal rotation to address pt's posture in posteror pelvic tilt with bil hip external rotation. Pt dozed during session.  She stated she would like to work on a craft project sitting EOB.  PT supplied her with supplies.  Gait with RW x 12' in room with min guard assist, including 2 turns to sit on bed.  O2 sats on 4 L O2 = 90-94 during session.   tx 3: 1605-1615, 10 min individual tx Pain: low back, unrated, premedicated  PT passed by pt's room and pt requested lying down in bed as she was fatigued from sitting EOB.  Pt able to move sit> supine (propped with HOB up) and scoot laterally on bed, with extra time. Pt left resting in supine with HOB at 48 degrees, on 4L O2 via [place].  See function navigator for current status   [name] 09/16/2016, 7:51 AM

## 2016-09-16 NOTE — Progress Notes (Signed)
Came to room to admin breathing treatment.  RN at bedside.  Pt w/ mild-mod increased WOB.  Sat 87-89% on 4 lpm [place].  BBSH w/ fine crackles.  Post breathing tx, BBSH w/ fine crackles.  Pt states she sometimes gets SOB & increased WOB at home for no apparent reason- pt states it can take 30 minutes for her breathing to recover back to baseline.  Post neb, pt placed on 6 lpm [place], sat 93%, pt WOB apprears to be improving.  Pt states she feels her breathing is improving now.    RN notified of above.  VSS currently.

## 2016-09-16 NOTE — Progress Notes (Signed)
Social Work Patient ID: Cheryl Wheeler, female   DOB: 1966-06-21, 50 y.o.   MRN: [id number]   Met with pt today to review team conference information.  (Left VM for spouse and asked that he follow up with me.)  Pt aware and agreeable with targeted d/c date of 12/22 and min assist goals.  She understands that this is dependent on progress with therapies and medical stability.  She denies any concerns at this point.  Flat affect and little conversation offered.  Will continue to follow.  Kailer Heindel, LCSW

## 2016-09-16 NOTE — Progress Notes (Signed)
Occupational Therapy Session Note  Patient Details  Name: Cheryl Wheeler MRN: [id number] Date of Birth: Apr 28, 1966  Today's Date: 09/16/2016 OT Individual Time: 1530-1600 OT Individual Time Calculation (min): 30 min     Short Term Goals: Week 1:  OT Short Term Goal 1 (Week 1): Pt will complete bathing with Min A and AE PRN OT Short Term Goal 2 (Week 1): Pt will complete LB dressing with Mod A  OT Short Term Goal 3 (Week 1): Pt will stand for 3 minutes while engaged in unilaterally involved task to increase independence with LB self care OT Short Term Goal 4 (Week 1): Pt will complete BSC transfer and LRAD with Mod A  Skilled Therapeutic Interventions/Progress Updates:     Upon entering the room, pt recently finished PT intervention and fatigued but transitioned easily to OT intervention. Pt seated on EOB engaging in table top activities with R UE. Pt sitting with SBA for balance and O2 via [place] on 4 L and pt saturation remaining above 90% this session. OT educating pt on progress and plans for upcoming sessions with pt input based on goals. Call bell and all needed items within reach upon exiting the room.   Therapy Documentation Precautions:  Precautions Precautions: Fall Precaution Comments: 5L 02 via nasal cannula  Restrictions Weight Bearing Restrictions: No General:   Vital Signs: Therapy Vitals Pulse Rate: 89 BP: (!) 107/54 Patient Position (if appropriate): Sitting Oxygen Therapy SpO2: 94 % O2 Device: Nasal Cannula O2 Flow Rate (L/min): 4 L/min Pain: 2nd Pain Site Pain Score: 6 Pain Type: Chronic pain Pain Location: Back Pain Orientation: Lower Pain Descriptors / Indicators: Aching Pain Frequency: Constant Pain Onset: On-going Pain Intervention(s): Medication (See eMAR) ADL: ADL ADL Comments: Please see functional navigator for ADL status  See Function Navigator for Current Functional Status.   Therapy/Group: Individual Therapy  [name], Susanne Baumgarner  P 09/16/2016, 4:13 PM

## 2016-09-16 NOTE — Progress Notes (Signed)
Physical Therapy Weekly Progress Note  Patient Details  Name: Cheryl Wheeler MRN: [id number] Date of Birth: 1966/04/30  Beginning of progress report period: September 10, 2016 End of progress report period: September 16, 2016  Patient has made slow progress and has met 0 of 5 short term goals.  Pt is currently mod-max A for bed mobility with HOB elevated, bed <> chair transfers with use of Stedy, gait short distances in // bars and one step negotiation with two rails.  Pt continues to be limited by impaired activity tolerance/endurance and cardiorespiratory status.    Patient continues to demonstrate the following deficits: impaired activity tolerance/endurance and cardiorespiratory status, impaired strength, ROM, postural control, balance, gait and therefore will continue to benefit from skilled PT intervention to enhance overall performance with activity tolerance, balance and postural control.  Patient not progressing toward long term goals.  See goal revision..  Plan of care revisions: goals downgraded to supervision-min A overall.  PT Short Term Goals Week 1:  PT Short Term Goal 1 (Week 1): Pt will perform bed mobility with HOB elevated with min A PT Short Term Goal 1 - Progress (Week 1): Progressing toward goal PT Short Term Goal 2 (Week 1): Pt will perform sit <> stand and stand pivot with RW and mod A PT Short Term Goal 2 - Progress (Week 1): Progressing toward goal PT Short Term Goal 3 (Week 1): Pt will perform w/c mobility x 100' with supervision for UE strengthening and endurance PT Short Term Goal 3 - Progress (Week 1): Not met PT Short Term Goal 4 (Week 1): Pt will perform gait x 25' with RW and mod A PT Short Term Goal 4 - Progress (Week 1): Not met PT Short Term Goal 5 (Week 1): Pt will negotiate 2 stairs with 2 rails and mod A PT Short Term Goal 5 - Progress (Week 1): Progressing toward goal Week 2:  PT Short Term Goal 1 (Week 2): Pt will perform bed mobility with HOB elevated  with min A PT Short Term Goal 2 (Week 2): Pt will perform sit <> stand and stand pivot with RW and mod A PT Short Term Goal 3 (Week 2): Pt will perform w/c mobility x 100' with supervision for UE strengthening and endurance PT Short Term Goal 4 (Week 2): Pt will perform gait x 25' with RW and mod A PT Short Term Goal 5 (Week 2): Pt will negotiate 2 stairs with 2 rails and mod A   Skilled Therapeutic Interventions/Progress Updates: Ambulation/gait training;Balance/vestibular training;Discharge planning;Disease management/prevention;DME/adaptive equipment instruction;Functional mobility training;Neuromuscular re-education;Pain management;Patient/family education;Psychosocial support;Splinting/orthotics;Stair training;Therapeutic Activities;Therapeutic Exercise;UE/LE Strength taining/ROM;Wheelchair propulsion/positioning   Therapy Documentation Precautions:  Precautions Precautions: Fall Precaution Comments: 5L 02 via nasal cannula  Restrictions Weight Bearing Restrictions: No Vital Signs: Therapy Vitals Temp: 99.2 F (37.3 C) Temp Source: Oral Pulse Rate: 88 BP: (!) 112/56 Patient Position (if appropriate): Lying Oxygen Therapy SpO2: (!) 88 % (RN in room- aware) O2 Device: Nasal Cannula O2 Flow Rate (L/min): 4 L/min Pain: Pain Assessment Pain Assessment: 0-10 Pain Score: 6  Pain Type: Chronic pain Pain Location: Back Pain Orientation: Lower Pain Descriptors / Indicators: Aching Pain Frequency: Constant Pain Onset: On-going Patients Stated Pain Goal: 2 Pain Intervention(s): Medication (See eMAR) Multiple Pain Sites: No  See Function Navigator for Current Functional Status.  Cheryl Wheeler 09/16/2016, 8:36 AM

## 2016-09-16 NOTE — Plan of Care (Signed)
Problem: RH Balance Goal: LTG Patient will maintain dynamic standing with ADLs (OT) LTG:  Patient will maintain dynamic standing balance with assist during activities of daily living (OT)   Downgraded secondary to pt's slow progress and safety  Problem: RH Dressing Goal: LTG Patient will perform upper body dressing (OT) LTG Patient will perform upper body dressing with assist, with/without cues (OT).  Downgraded secondary to pt's slow progress and safety Goal: LTG Patient will perform lower body dressing w/assist (OT) LTG: Patient will perform lower body dressing with assist, with/without cues in positioning using equipment (OT)  Downgraded secondary to pt's slow progress and safety

## 2016-09-16 NOTE — Patient Care Conference (Signed)
Inpatient RehabilitationTeam Conference and Plan of Care Update Date: 09/15/2016   Time: 2:45 pm    Patient Name: Cheryl Wheeler      Medical Record Number: [id number]  Date of Birth: 09/15/1966 Sex: Female         Room/Bed: 4W05C/4W05C-01 Payor Info: Payor: [employer] MEDICARE / Plan: Monia Pouch[employer]ETNA MEDICARE HMO/PPO / Product Type: *No Product type* /    Admitting Diagnosis: Debility Resp Failure  Admit Date/Time:  09/09/2016  5:47 PM Admission Comments: No comment available   Primary Diagnosis:  <principal problem not specified> Principal Problem: <principal problem not specified>  Patient Active Problem List   Diagnosis Date Noted  . HCAP (healthcare-associated pneumonia)   . Benign essential HTN   . Type 2 diabetes mellitus with peripheral neuropathy (HCC)   . Acute on chronic respiratory failure with hypoxia (HCC)   . Opacity of lung on imaging study   . Chronic obstructive pulmonary disease (HCC)   . Bipolar affective disorder (HCC)   . Essential hypertension   . Acute on chronic diastolic heart failure (HCC)   . Urinary incontinence   . Acute blood loss anemia   . Hypoalbuminemia due to protein-calorie malnutrition (HCC)   . 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 COPD exacerbation (HCC) 09/06/2016  . Granulomatous disease (HCC) 09/06/2016  . Diastolic heart failure (HCC) 09/06/2016  . DM (diabetes mellitus) (HCC) 09/06/2016  . HTN (hypertension) 09/06/2016  . Bipolar disorder (HCC) 09/06/2016  . Anxiety 09/06/2016  . Chronic back pain 09/06/2016  . OSA (obstructive sleep apnea) 09/06/2016  . Obesity hypoventilation syndrome (HCC) 09/06/2016  . Acute pulmonary edema (HCC)   . CAP (community acquired pneumonia)   . Dyspnea   . Acute respiratory failure with hypoxia (HCC) 08/26/2016    Expected Discharge Date: Expected Discharge Date: 10/01/16  Team Members Present: Physician leading conference: Dr. Maryla MorrowAnkit Patel Social Worker Present: Amada JupiterLucy Jeffifer Rabold, LCSW Nurse Present: Carmie EndAngie  Joyce, RN PT Present: Edman CircleAudra Hall, PT;Rodney Leo GrosserWishart, PT OT Present: Callie FieldingKatie Pittman, OT SLP Present: Fae PippinMelissa Bowie, SLP PPS Coordinator present : Tora DuckMarie Noel, RN, CRRN     Current Status/Progress Goal Weekly Team Focus  Medical   Debilitation with functional mobility deficits secondary to critical illness myopathy after VDRF/COPD exacerbation.   Improve mobility, endurance, COPD  See above   Bowel/Bladder   Incont of bowel and bladder/with urgency/ wears brief/ LBM 09/12/16  min assist  mon. q shift   Swallow/Nutrition/ Hydration             ADL's   extremely fatigued with activity, min - total A with self care tasks  mod I - min A   activity tolerance, strengthening, pt/family education, self care retraining   Mobility   max-total A with Stedy  Supervision  activity tolerance/endurance, LE strengthening for standing, pre-gait activities   Communication             Safety/Cognition/ Behavioral Observations  no unsafe behavior  min assist  monitor q shift   Pain   complaints of headache/tylenol  650mg  prn  pain less than or equal to 2  monitor q shift   Skin   no skin break down  no thakin break down this admission  monitor q shift    Rehab Goals Patient on target to meet rehab goals: Yes *See Care Plan and progress notes for long and short-term goals.  Barriers to Discharge: Mobility, endurance, COPD, HCAP, urinary incontinence, DM, HTN, lung disease    Possible Resolutions to Barriers:  Pulm consulted-workup ongoing, therapies, IV abx, infectious workup ongoing    Discharge Planning/Teaching Needs:  Plan for pt to d/c home with spouse able to provide 24/7 assistance.  Have not had an opportunity to meet spouse yet but messages left to confirm this plan.  Teaching to be scheduled with spouse prior to d/c.   Team Discussion:  Poor baseline function and poor activity level. Pulm consult due to concerns and may need to change abx.  W/u underway for possible auto-immune  process.  Slow progress with therapies due to not feeling well and very weak.  Pre-gait work;  Very poor tolerance.  Mod-max assist overall with txs.  Very lethargic.  Husband remains very involved.  Revisions to Treatment Plan:  Recommend neuropsychology consult next week.   Continued Need for Acute Rehabilitation Level of Care: The patient requires daily medical management by a physician with specialized training in physical medicine and rehabilitation for the following conditions: Daily direction of a multidisciplinary physical rehabilitation program to ensure safe treatment while eliciting the highest outcome that is of practical value to the patient.: Yes Daily medical management of patient stability for increased activity during participation in an intensive rehabilitation regime.: Yes Daily analysis of laboratory values and/or radiology reports with any subsequent need for medication adjustment of medical intervention for : Pulmonary problems;Neurological problems;Urological problems  [name] 09/16/2016, 2:57 PM

## 2016-09-16 NOTE — Progress Notes (Signed)
Pharmacy Antibiotic Note  [age] YOF with chronic granulomatous disease and maintained on Bactrim for prophylaxis followed by immunologist as outpatient, initially transferred [facility] with acute on chronic respiratory failure required intubation and broad spectrum abx. Now repeat Xray with Multifocal patchy opacities in the bilateral upper lobes and left lower lobe, suspicious for pneumonia, pharmacy was consulted on vancomycin and cefepime empirically  12/07: Vancomycin trough 8 (drawn ~ 1hr late)  Plan: - Change vancomycin to 1g iv q8h.  Repeat vancomycin trough at steady state. - cefepime 2g IV Q 12hrs - BMET Q 72 hrs    Height: 4' 11.5" (151.1 cm) Weight: 179 lb (81.2 kg) IBW/kg (Calculated) : 44.35  Temp (24hrs), Avg:98.9 F (37.2 C), Min:98.6 F (37 C), Max:99.2 F (37.3 C)   Recent Labs Lab 09/10/16 0446 09/13/16 1524 09/16/16 0516 09/16/16 1430  WBC 9.9 5.5  --   --   CREATININE 0.50  --  0.42*  --   VANCOTROUGH  --   --   --  8*    Estimated Creatinine Clearance: 77.6 mL/min (by C-G formula based on SCr of 0.42 mg/dL (L)).    Allergies  Allergen Reactions  . Fentanyl Shortness Of Breath  . Meperidine Other (See Comments)    chest pain-"feels like squeezing heart"  . Azithromycin Other (See Comments)    VRE  . Codeine Nausea Only  . Levofloxacin Other (See Comments)    Unknown  . Other Other (See Comments)    BLACK STICHES=BODY REJECTS THEM  . Penicillins Rash  . Sulfa Antibiotics Rash    Tolerates Bactrim    Antimicrobials this admission:  Vancomycin 12/4 >> Zosyn 12/4 >> Fluconazole 12/06 x 8 days >> (12/13) On chronic Bactrim    Dose adjustments this admission:  12/07: VT 8 (Goal 15-20) > change to 1g iv q8h  Microbiology results:  12/5 UCx: neg 12/5 Sputum: pending Thank you for allowing pharmacy to be a part of this patient's care.  Khoury Siemon, Tsz-Yin 09/16/2016 4:09 PM

## 2016-09-16 NOTE — Progress Notes (Signed)
Physical Therapy Session Note  Patient Details  Name: Cheryl Wheeler MRN: [id number] Date of Birth: 27-Nov-1965  Today's Date: 09/16/2016 PT Individual Time: 1300-1400 PT Individual Time Calculation (min): 60 min    Short Term Goals: Week 2:  PT Short Term Goal 1 (Week 2): Pt will perform bed mobility with HOB elevated with min A PT Short Term Goal 2 (Week 2): Pt will perform sit <> stand and stand pivot with RW and mod A PT Short Term Goal 3 (Week 2): Pt will perform w/c mobility x 100' with supervision for UE strengthening and endurance PT Short Term Goal 4 (Week 2): Pt will perform gait x 25' with RW and mod A PT Short Term Goal 5 (Week 2): Pt will negotiate 2 stairs with 2 rails and mod A  Skilled Therapeutic Interventions/Progress Updates:   Pt received seated in bed, denies pain and agreeable to treatment. Pt on 4L O2 via [place] throughout session; O2 dropped to 89% with activity with use of biofeedback and cues for pursed lip breathing to increase to 92% while resting. Seated in bed>seated on EOB with S and bedrails. Pt threads BLE into pants, sit <>stand with stedy and S, however totalA for pulling up pants. Sit <>stand in stedy x2 trials while receiving breathing treatment from RT; standing tolerance of 1 min first trial, 1 min 30 sec on second trial. Transferred to w/c totalA with stedy. Stand pivot transfer w/c >mat table with no AD, minA. Sit <>stand from edge of mat with RW and minA faded to S with repetition. Standing marching 2x10 steps with seated rest break between trials d/t fatigue. Gait x6' mat table>w/c with min guard, no LOB or buckling noted but pt reports limited by SOB and "shakiness" in LEs. Pt returned to room totalA for energy conservation; remained seated in w/c at end of session all needs in reach. Charge nurse alerted to pt position; dynamap pulse ox was on pt when therapist arrived for session, however not donned at completion of session d/t stable vitals throughout  session.   Therapy Documentation Precautions:  Precautions Precautions: Fall Precaution Comments: 5L 02 via nasal cannula  Restrictions Weight Bearing Restrictions: No Vital Signs: Oxygen Therapy SpO2: 90 % O2 Device: Nasal Cannula   See Function Navigator for Current Functional Status.   Therapy/Group: Individual Therapy  Vista Lawmanlizabeth J Tygielski 09/16/2016, 2:52 PM

## 2016-09-16 NOTE — Progress Notes (Signed)
Occupational Therapy Weekly Progress Note  Patient Details  Name: Cheryl Wheeler MRN: [id number] Date of Birth: Jan 09, 1966  Beginning of progress report period: September 10, 2016 End of progress report period: September 16, 2016    Patient has met 0 of 5 short term goals.  Pt has made slow progress towards OT goals this week. Her greatest barrier this week has been increased fatigue and SOB. Pt is motivated for OT intervention. Pt currently engages in dressing and bathing from sink with max A  sit <>stand . Pt needing max A for functional transfers as she needs lifting and lowering assistance.Pt continues to benefit from OT intervention.   Patient continues to demonstrate the following deficits: muscle weakness, decreased cardiorespiratoy endurance and decreased oxygen support and decreased sitting balance, decreased standing balance and decreased balance strategies and therefore will continue to benefit from skilled OT intervention to enhance overall performance with BADL.  Patient not progressing toward long term goals.  See goal revision..  Plan of care revisions: goals downgraded to supervision - min A.  OT Short Term Goals Week 1:  OT Short Term Goal 1 (Week 1): Pt will complete bathing with Min A and AE PRN OT Short Term Goal 1 - Progress (Week 1): Not met OT Short Term Goal 2 (Week 1): Pt will complete LB dressing with Mod A  OT Short Term Goal 2 - Progress (Week 1): Not met OT Short Term Goal 3 (Week 1): Pt will stand for 3 minutes while engaged in unilaterally involved task to increase independence with LB self care OT Short Term Goal 3 - Progress (Week 1): Not met OT Short Term Goal 4 (Week 1): Pt will complete BSC transfer and LRAD with Mod A OT Short Term Goal 4 - Progress (Week 1): Not met Week 2:  OT Short Term Goal 1 (Week 2): Pt will engage in 5 minutes of functional task with less than 2 rest breaks. OT Short Term Goal 2 (Week 2): Pt will perform bathing from sink with mod A  in order to decrease level of assist with self care. OT Short Term Goal 3 (Week 2): Pt will perform toilet transfer with mod A in order to decrease level of assist for functional transfers. OT Short Term Goal 4 (Week 2): Pt will perform LB dressing with max A in order to decrease level of assist with functional task.    Skilled Therapeutic Interventions/Progress Updates:     Therapy Documentation Precautions:  Precautions Precautions: Fall Precaution Comments: 5L 02 via nasal cannula  Restrictions Weight Bearing Restrictions: No General:   Vital Signs: Oxygen Therapy O2 Device: Nasal Cannula O2 Flow Rate (L/min): 3.5 L/min Pain:   ADL: ADL ADL Comments: Please see functional navigator for ADL status Exercises:    Gypsy Decant 09/16/2016, 8:19 PM

## 2016-09-16 NOTE — Progress Notes (Signed)
Occupational Therapy Session Note  Patient Details  Name: Cheryl Wheeler MRN: [id number] Date of Birth: 1966-01-09  Today's Date: 09/16/2016 OT Individual Time: [id number] OT Individual Time Calculation (min): 24 min  and Today's Date: 09/16/2016 OT Missed Time: 36 Minutes Missed Time Reason: Patient ill (comment)     Short Term Goals: Week 1:  OT Short Term Goal 1 (Week 1): Pt will complete bathing with Min A and AE PRN OT Short Term Goal 2 (Week 1): Pt will complete LB dressing with Mod A  OT Short Term Goal 3 (Week 1): Pt will stand for 3 minutes while engaged in unilaterally involved task to increase independence with LB self care OT Short Term Goal 4 (Week 1): Pt will complete BSC transfer and LRAD with Mod A  Skilled Therapeutic Interventions/Progress Updates:     Upon entering the room, pt supine in bed with RN present in room as pt was incontinent of bowel prior to OT entering. Pt with c/o dizziness this session. Vitals taken with BP being 112/56, HR 88, and BP on 4L via [place] ranging from 87-90%. Cold compress placed on forehead per pt request. Pt is diaphoretic during this time as well. OT positioned pt in bed for comfort and head up to assist with breathing difficulty. RN remains present in room and RT enters for breathing treatment. Pt continues to report dizziness at this time as well. All needs within reach upon exiting the room.   Therapy Documentation Precautions:  Precautions Precautions: Fall Precaution Comments: 5L 02 via nasal cannula  Restrictions Weight Bearing Restrictions: No General: General OT Amount of Missed Time: 36 Minutes Vital Signs: Therapy Vitals Temp: 99.2 F (37.3 C) Temp Source: Oral Pulse Rate: 88 BP: (!) 112/56 Patient Position (if appropriate): Lying Oxygen Therapy SpO2: (!) 88 % (RN in room- aware) O2 Device: Nasal Cannula O2 Flow Rate (L/min): 4 L/min Pain: Pain Assessment Pain Assessment: 0-10 Pain Score: 6  Pain Type: Chronic  pain Pain Location: Back Pain Orientation: Lower Pain Descriptors / Indicators: Aching Pain Frequency: Constant Pain Onset: On-going Patients Stated Pain Goal: 2 Pain Intervention(s): Medication (See eMAR) Multiple Pain Sites: No ADL: ADL ADL Comments: Please see functional navigator for ADL status Exercises:   Other Treatments:    See Function Navigator for Current Functional Status.   Therapy/Group: Individual Therapy  [name], Chery Giusto P 09/16/2016, 8:36 AM

## 2016-09-17 ENCOUNTER — Inpatient Hospital Stay (HOSPITAL_COMMUNITY)
Admission: AD | Admit: 2016-09-17 | Discharge: 2016-09-28 | DRG: 867 | Disposition: A | Discharge status: Rehabilitation Facility | Payer: Medicare HMO | Source: Ambulatory Visit | Attending: Nephrology | Admitting: Nephrology

## 2016-09-17 ENCOUNTER — Inpatient Hospital Stay (HOSPITAL_COMMUNITY): Payer: Medicare HMO | Admitting: Occupational Therapy

## 2016-09-17 ENCOUNTER — Inpatient Hospital Stay (HOSPITAL_COMMUNITY): Payer: Medicare HMO | Admitting: Physical Therapy

## 2016-09-17 ENCOUNTER — Inpatient Hospital Stay (HOSPITAL_COMMUNITY): Payer: Medicare HMO

## 2016-09-17 DIAGNOSIS — F319 Bipolar disorder, unspecified: Secondary | ICD-10-CM | POA: Diagnosis present

## 2016-09-17 DIAGNOSIS — Z885 Allergy status to narcotic agent status: Secondary | ICD-10-CM

## 2016-09-17 DIAGNOSIS — Z6835 Body mass index (BMI) 35.0-35.9, adult: Secondary | ICD-10-CM

## 2016-09-17 DIAGNOSIS — F3112 Bipolar disorder, current episode manic without psychotic features, moderate: Secondary | ICD-10-CM

## 2016-09-17 DIAGNOSIS — E1142 Type 2 diabetes mellitus with diabetic polyneuropathy: Secondary | ICD-10-CM | POA: Diagnosis present

## 2016-09-17 DIAGNOSIS — D71 Functional disorders of polymorphonuclear neutrophils: Secondary | ICD-10-CM | POA: Diagnosis present

## 2016-09-17 DIAGNOSIS — J9622 Acute and chronic respiratory failure with hypercapnia: Secondary | ICD-10-CM | POA: Diagnosis present

## 2016-09-17 DIAGNOSIS — Z888 Allergy status to other drugs, medicaments and biological substances status: Secondary | ICD-10-CM

## 2016-09-17 DIAGNOSIS — J9621 Acute and chronic respiratory failure with hypoxia: Secondary | ICD-10-CM | POA: Diagnosis present

## 2016-09-17 DIAGNOSIS — Z881 Allergy status to other antibiotic agents status: Secondary | ICD-10-CM

## 2016-09-17 DIAGNOSIS — E662 Morbid (severe) obesity with alveolar hypoventilation: Secondary | ICD-10-CM | POA: Diagnosis present

## 2016-09-17 DIAGNOSIS — R262 Difficulty in walking, not elsewhere classified: Secondary | ICD-10-CM

## 2016-09-17 DIAGNOSIS — J189 Pneumonia, unspecified organism: Secondary | ICD-10-CM | POA: Diagnosis not present

## 2016-09-17 DIAGNOSIS — Z9981 Dependence on supplemental oxygen: Secondary | ICD-10-CM | POA: Diagnosis not present

## 2016-09-17 DIAGNOSIS — R32 Unspecified urinary incontinence: Secondary | ICD-10-CM | POA: Diagnosis present

## 2016-09-17 DIAGNOSIS — F3170 Bipolar disorder, currently in remission, most recent episode unspecified: Secondary | ICD-10-CM | POA: Diagnosis not present

## 2016-09-17 DIAGNOSIS — J9601 Acute respiratory failure with hypoxia: Secondary | ICD-10-CM | POA: Diagnosis present

## 2016-09-17 DIAGNOSIS — G894 Chronic pain syndrome: Secondary | ICD-10-CM | POA: Diagnosis present

## 2016-09-17 DIAGNOSIS — K219 Gastro-esophageal reflux disease without esophagitis: Secondary | ICD-10-CM

## 2016-09-17 DIAGNOSIS — B49 Unspecified mycosis: Secondary | ICD-10-CM | POA: Diagnosis present

## 2016-09-17 DIAGNOSIS — J168 Pneumonia due to other specified infectious organisms: Secondary | ICD-10-CM | POA: Diagnosis present

## 2016-09-17 DIAGNOSIS — Z7952 Long term (current) use of systemic steroids: Secondary | ICD-10-CM

## 2016-09-17 DIAGNOSIS — M797 Fibromyalgia: Secondary | ICD-10-CM | POA: Diagnosis present

## 2016-09-17 DIAGNOSIS — E46 Unspecified protein-calorie malnutrition: Secondary | ICD-10-CM | POA: Diagnosis present

## 2016-09-17 DIAGNOSIS — E785 Hyperlipidemia, unspecified: Secondary | ICD-10-CM | POA: Diagnosis present

## 2016-09-17 DIAGNOSIS — J441 Chronic obstructive pulmonary disease with (acute) exacerbation: Secondary | ICD-10-CM | POA: Diagnosis not present

## 2016-09-17 DIAGNOSIS — D718 Other functional disorders of polymorphonuclear neutrophils: Secondary | ICD-10-CM | POA: Diagnosis present

## 2016-09-17 DIAGNOSIS — F419 Anxiety disorder, unspecified: Secondary | ICD-10-CM | POA: Diagnosis present

## 2016-09-17 DIAGNOSIS — I110 Hypertensive heart disease with heart failure: Secondary | ICD-10-CM | POA: Diagnosis present

## 2016-09-17 DIAGNOSIS — J9600 Acute respiratory failure, unspecified whether with hypoxia or hypercapnia: Secondary | ICD-10-CM | POA: Diagnosis not present

## 2016-09-17 DIAGNOSIS — J440 Chronic obstructive pulmonary disease with acute lower respiratory infection: Secondary | ICD-10-CM | POA: Diagnosis present

## 2016-09-17 DIAGNOSIS — I5032 Chronic diastolic (congestive) heart failure: Secondary | ICD-10-CM | POA: Diagnosis present

## 2016-09-17 DIAGNOSIS — Z87891 Personal history of nicotine dependence: Secondary | ICD-10-CM

## 2016-09-17 DIAGNOSIS — I10 Essential (primary) hypertension: Secondary | ICD-10-CM | POA: Diagnosis present

## 2016-09-17 DIAGNOSIS — Z8701 Personal history of pneumonia (recurrent): Secondary | ICD-10-CM

## 2016-09-17 DIAGNOSIS — B974 Respiratory syncytial virus as the cause of diseases classified elsewhere: Secondary | ICD-10-CM | POA: Diagnosis present

## 2016-09-17 DIAGNOSIS — E1165 Type 2 diabetes mellitus with hyperglycemia: Secondary | ICD-10-CM | POA: Diagnosis present

## 2016-09-17 DIAGNOSIS — R0600 Dyspnea, unspecified: Secondary | ICD-10-CM | POA: Diagnosis present

## 2016-09-17 DIAGNOSIS — J9602 Acute respiratory failure with hypercapnia: Secondary | ICD-10-CM | POA: Diagnosis not present

## 2016-09-17 DIAGNOSIS — B449 Aspergillosis, unspecified: Secondary | ICD-10-CM

## 2016-09-17 DIAGNOSIS — T380X5A Adverse effect of glucocorticoids and synthetic analogues, initial encounter: Secondary | ICD-10-CM | POA: Diagnosis present

## 2016-09-17 DIAGNOSIS — J9611 Chronic respiratory failure with hypoxia: Secondary | ICD-10-CM | POA: Diagnosis not present

## 2016-09-17 DIAGNOSIS — D6489 Other specified anemias: Secondary | ICD-10-CM | POA: Diagnosis present

## 2016-09-17 DIAGNOSIS — Z882 Allergy status to sulfonamides status: Secondary | ICD-10-CM

## 2016-09-17 DIAGNOSIS — E876 Hypokalemia: Secondary | ICD-10-CM | POA: Diagnosis present

## 2016-09-17 DIAGNOSIS — Z8619 Personal history of other infectious and parasitic diseases: Secondary | ICD-10-CM

## 2016-09-17 DIAGNOSIS — J449 Chronic obstructive pulmonary disease, unspecified: Secondary | ICD-10-CM | POA: Diagnosis not present

## 2016-09-17 DIAGNOSIS — J984 Other disorders of lung: Secondary | ICD-10-CM | POA: Diagnosis not present

## 2016-09-17 DIAGNOSIS — Z794 Long term (current) use of insulin: Secondary | ICD-10-CM

## 2016-09-17 DIAGNOSIS — I5033 Acute on chronic diastolic (congestive) heart failure: Secondary | ICD-10-CM | POA: Diagnosis not present

## 2016-09-17 DIAGNOSIS — J9690 Respiratory failure, unspecified, unspecified whether with hypoxia or hypercapnia: Secondary | ICD-10-CM

## 2016-09-17 DIAGNOSIS — Z880 Allergy status to penicillin: Secondary | ICD-10-CM

## 2016-09-17 DIAGNOSIS — E8809 Other disorders of plasma-protein metabolism, not elsewhere classified: Secondary | ICD-10-CM | POA: Diagnosis present

## 2016-09-17 LAB — BASIC METABOLIC PANEL
ANION GAP: 9 (ref 5–15)
BUN: 7 mg/dL (ref 6–20)
CHLORIDE: 90 mmol/L — AB (ref 101–111)
CO2: 38 mmol/L — ABNORMAL HIGH (ref 22–32)
Calcium: 8.7 mg/dL — ABNORMAL LOW (ref 8.9–10.3)
Creatinine, Ser: 0.43 mg/dL — ABNORMAL LOW (ref 0.44–1.00)
GFR calc Af Amer: >60 mL/min (ref >60)
GLUCOSE: 113 mg/dL — AB (ref 65–99)
POTASSIUM: 2.8 mmol/L — AB (ref 3.5–5.1)
SODIUM: 137 mmol/L (ref 135–145)

## 2016-09-17 LAB — CBC
HCT: 29.5 % — ABNORMAL LOW (ref 36.0–46.0)
HEMOGLOBIN: 8.9 g/dL — AB (ref 12.0–15.0)
MCH: 25.6 pg — AB (ref 26.0–34.0)
MCHC: 30.2 g/dL (ref 30.0–36.0)
MCV: 85 fL (ref 78.0–100.0)
PLATELETS: 159 10*3/uL (ref 150–400)
RBC: 3.47 MIL/uL — AB (ref 3.87–5.11)
RDW: 18.7 % — ABNORMAL HIGH (ref 11.5–15.5)
WBC: 4.2 10*3/uL (ref 4.0–10.5)

## 2016-09-17 LAB — BRAIN NATRIURETIC PEPTIDE: B NATRIURETIC PEPTIDE 5: 36.2 pg/mL (ref 0.0–100.0)

## 2016-09-17 LAB — GLUCOSE, CAPILLARY
GLUCOSE-CAPILLARY: 80 mg/dL (ref 65–99)
Glucose-Capillary: 91 mg/dL (ref 65–99)
Glucose-Capillary: 100 mg/dL — ABNORMAL HIGH (ref 65–99)
Glucose-Capillary: 81 mg/dL (ref 65–99)

## 2016-09-17 LAB — PROCALCITONIN

## 2016-09-17 LAB — PHOSPHORUS: Phosphorus: 3.8 mg/dL (ref 2.5–4.6)

## 2016-09-17 LAB — MAGNESIUM: MAGNESIUM: 1.6 mg/dL — AB (ref 1.7–2.4)

## 2016-09-17 MED ORDER — MIRTAZAPINE 15 MG PO TABS
45.0000 mg | ORAL_TABLET | Freq: Every day | ORAL | Status: DC
  Administered 2016-09-17 – 2016-09-27 (×11): 45 mg via ORAL
  Filled 2016-09-17 (×11): qty 3

## 2016-09-17 MED ORDER — SODIUM CHLORIDE 0.9 % IV SOLN
Freq: Once | INTRAVENOUS | Status: AC
  Administered 2016-09-18: 01:00:00 via INTRAVENOUS
  Filled 2016-09-17: qty 1000

## 2016-09-17 MED ORDER — ONDANSETRON HCL 4 MG/2ML IJ SOLN: 4.0000 mg | Freq: Four times a day (QID) | INTRAMUSCULAR | Status: DC | PRN

## 2016-09-17 MED ORDER — MAGNESIUM SULFATE 4 GM/100ML IV SOLN
4.0000 g | Freq: Once | INTRAVENOUS | Status: AC
  Administered 2016-09-18: 4 g via INTRAVENOUS
  Filled 2016-09-17: qty 100

## 2016-09-17 MED ORDER — SULFAMETHOXAZOLE-TRIMETHOPRIM 800-160 MG PO TABS
1.0000 | ORAL_TABLET | Freq: Two times a day (BID) | ORAL | Status: DC
  Administered 2016-09-17 – 2016-09-19 (×5): 1 via ORAL
  Filled 2016-09-17 (×6): qty 1

## 2016-09-17 MED ORDER — VORICONAZOLE 200 MG IV SOLR
4.0000 mg/kg | Freq: Two times a day (BID) | INTRAVENOUS | Status: DC
  Administered 2016-09-18 – 2016-09-20 (×4): 310 mg via INTRAVENOUS
  Filled 2016-09-17 (×7): qty 310

## 2016-09-17 MED ORDER — MORPHINE SULFATE 15 MG PO TABS
15.0000 mg | ORAL_TABLET | Freq: Two times a day (BID) | ORAL | Status: DC | PRN
  Administered 2016-09-18 – 2016-09-28 (×17): 15 mg via ORAL
  Filled 2016-09-17 (×17): qty 1

## 2016-09-17 MED ORDER — PANTOPRAZOLE SODIUM 40 MG PO TBEC
40.0000 mg | DELAYED_RELEASE_TABLET | Freq: Every day | ORAL | Status: DC
  Administered 2016-09-17 – 2016-09-28 (×11): 40 mg via ORAL
  Filled 2016-09-17 (×12): qty 1

## 2016-09-17 MED ORDER — PREDNISONE 10 MG (21) PO TBPK: 10.0000 mg | ORAL_TABLET | Freq: Four times a day (QID) | ORAL | Status: DC

## 2016-09-17 MED ORDER — ENOXAPARIN SODIUM 40 MG/0.4ML ~~LOC~~ SOLN
40.0000 mg | SUBCUTANEOUS | Status: DC
  Administered 2016-09-17 – 2016-09-19 (×3): 40 mg via SUBCUTANEOUS
  Filled 2016-09-17 (×3): qty 0.4

## 2016-09-17 MED ORDER — HYDROXYZINE HCL 25 MG PO TABS
25.0000 mg | ORAL_TABLET | Freq: Four times a day (QID) | ORAL | Status: DC | PRN
  Administered 2016-09-18 – 2016-09-28 (×8): 25 mg via ORAL
  Filled 2016-09-17 (×8): qty 1

## 2016-09-17 MED ORDER — MOMETASONE FURO-FORMOTEROL FUM 200-5 MCG/ACT IN AERO
2.0000 | INHALATION_SPRAY | Freq: Two times a day (BID) | RESPIRATORY_TRACT | Status: DC
  Administered 2016-09-18 – 2016-09-19 (×3): 2 via RESPIRATORY_TRACT
  Filled 2016-09-17: qty 8.8

## 2016-09-17 MED ORDER — IPRATROPIUM-ALBUTEROL 0.5-2.5 (3) MG/3ML IN SOLN: 3.0000 mL | Freq: Four times a day (QID) | RESPIRATORY_TRACT | Status: DC

## 2016-09-17 MED ORDER — POTASSIUM CHLORIDE CRYS ER 20 MEQ PO TBCR: 20.0000 meq | EXTENDED_RELEASE_TABLET | Freq: Every day | ORAL | Status: DC

## 2016-09-17 MED ORDER — DEXTROSE 5 % IV SOLN
2.0000 g | Freq: Two times a day (BID) | INTRAVENOUS | Status: DC
  Administered 2016-09-17 – 2016-09-20 (×6): 2 g via INTRAVENOUS
  Filled 2016-09-17 (×7): qty 2

## 2016-09-17 MED ORDER — ATORVASTATIN CALCIUM 10 MG PO TABS
10.0000 mg | ORAL_TABLET | Freq: Every day | ORAL | Status: DC
  Administered 2016-09-18 – 2016-09-28 (×11): 10 mg via ORAL
  Filled 2016-09-17 (×11): qty 1

## 2016-09-17 MED ORDER — HYDROXYZINE PAMOATE 25 MG PO CAPS
25.0000 mg | ORAL_CAPSULE | Freq: Four times a day (QID) | ORAL | Status: DC | PRN
  Filled 2016-09-17: qty 1

## 2016-09-17 MED ORDER — VANCOMYCIN HCL IN DEXTROSE 1-5 GM/200ML-% IV SOLN
1000.0000 mg | Freq: Three times a day (TID) | INTRAVENOUS | Status: DC
  Administered 2016-09-17 – 2016-09-20 (×8): 1000 mg via INTRAVENOUS
  Filled 2016-09-17 (×10): qty 200

## 2016-09-17 MED ORDER — INSULIN GLARGINE 100 UNIT/ML ~~LOC~~ SOLN
14.0000 [IU] | SUBCUTANEOUS | Status: DC
  Administered 2016-09-17: 14 [IU] via SUBCUTANEOUS
  Filled 2016-09-17 (×3): qty 0.14

## 2016-09-17 MED ORDER — ACETAMINOPHEN 325 MG PO TABS
650.0000 mg | ORAL_TABLET | ORAL | Status: DC | PRN
  Administered 2016-09-19: 650 mg via ORAL
  Filled 2016-09-17: qty 2

## 2016-09-17 MED ORDER — FUROSEMIDE 20 MG PO TABS
20.0000 mg | ORAL_TABLET | Freq: Every day | ORAL | Status: DC
  Administered 2016-09-18: 20 mg via ORAL
  Filled 2016-09-17: qty 1

## 2016-09-17 MED ORDER — VORICONAZOLE 200 MG IV SOLR
6.0000 mg/kg | Freq: Two times a day (BID) | INTRAVENOUS | Status: AC
  Administered 2016-09-17 – 2016-09-18 (×2): 470 mg via INTRAVENOUS
  Filled 2016-09-17 (×2): qty 470

## 2016-09-17 MED ORDER — VALPROIC ACID 250 MG PO CAPS
500.0000 mg | ORAL_CAPSULE | Freq: Two times a day (BID) | ORAL | Status: DC
  Administered 2016-09-17 – 2016-09-28 (×22): 500 mg via ORAL
  Filled 2016-09-17 (×25): qty 2

## 2016-09-17 MED ORDER — ACETAMINOPHEN 325 MG PO TABS
650.0000 mg | ORAL_TABLET | Freq: Four times a day (QID) | ORAL | Status: DC | PRN
  Administered 2016-09-20 – 2016-09-24 (×5): 650 mg via ORAL
  Filled 2016-09-17 (×5): qty 2

## 2016-09-17 MED ORDER — IPRATROPIUM-ALBUTEROL 0.5-2.5 (3) MG/3ML IN SOLN
3.0000 mL | Freq: Four times a day (QID) | RESPIRATORY_TRACT | Status: DC
  Administered 2016-09-17 – 2016-09-19 (×10): 3 mL via RESPIRATORY_TRACT
  Filled 2016-09-17 (×10): qty 3

## 2016-09-17 MED ORDER — GUAIFENESIN-DM 100-10 MG/5ML PO SYRP: 5.0000 mL | ORAL_SOLUTION | ORAL | Status: DC | PRN

## 2016-09-17 MED ORDER — IPRATROPIUM BROMIDE 0.02 % IN SOLN
0.5000 mg | Freq: Three times a day (TID) | RESPIRATORY_TRACT | Status: DC
  Administered 2016-09-17: 0.5 mg via RESPIRATORY_TRACT
  Filled 2016-09-17: qty 2.5

## 2016-09-17 MED ORDER — ORAL CARE MOUTH RINSE
15.0000 mL | Freq: Two times a day (BID) | OROMUCOSAL | Status: DC
  Administered 2016-09-18 – 2016-09-27 (×17): 15 mL via OROMUCOSAL

## 2016-09-17 MED ORDER — RISPERIDONE 2 MG PO TABS
2.0000 mg | ORAL_TABLET | Freq: Every day | ORAL | Status: DC
  Administered 2016-09-17 – 2016-09-18 (×2): 2 mg via ORAL
  Filled 2016-09-17 (×2): qty 1

## 2016-09-17 MED ORDER — ALBUTEROL SULFATE (2.5 MG/3ML) 0.083% IN NEBU: 2.5000 mg | INHALATION_SOLUTION | RESPIRATORY_TRACT | Status: DC | PRN

## 2016-09-17 MED ORDER — CHLORHEXIDINE GLUCONATE 0.12 % MT SOLN
15.0000 mL | Freq: Two times a day (BID) | OROMUCOSAL | Status: DC
  Administered 2016-09-17 – 2016-09-28 (×21): 15 mL via OROMUCOSAL
  Filled 2016-09-17 (×18): qty 15

## 2016-09-17 MED ORDER — LORAZEPAM 0.5 MG PO TABS
0.5000 mg | ORAL_TABLET | Freq: Three times a day (TID) | ORAL | Status: DC | PRN
  Administered 2016-09-18 – 2016-09-27 (×11): 0.5 mg via ORAL
  Filled 2016-09-17 (×12): qty 1

## 2016-09-17 MED ORDER — LABETALOL HCL 200 MG PO TABS
300.0000 mg | ORAL_TABLET | Freq: Three times a day (TID) | ORAL | Status: DC
  Administered 2016-09-17 – 2016-09-28 (×29): 300 mg via ORAL
  Filled 2016-09-17: qty 2
  Filled 2016-09-17 (×3): qty 1
  Filled 2016-09-17 (×2): qty 2
  Filled 2016-09-17: qty 1
  Filled 2016-09-17 (×3): qty 2
  Filled 2016-09-17 (×2): qty 1
  Filled 2016-09-17: qty 2
  Filled 2016-09-17 (×7): qty 1
  Filled 2016-09-17: qty 2
  Filled 2016-09-17 (×4): qty 1
  Filled 2016-09-17 (×3): qty 2
  Filled 2016-09-17: qty 1
  Filled 2016-09-17: qty 2

## 2016-09-17 MED ORDER — SODIUM CHLORIDE 0.9 % IV SOLN
250.0000 mL | INTRAVENOUS | Status: DC | PRN
  Administered 2016-09-19 – 2016-09-24 (×4): 250 mL via INTRAVENOUS

## 2016-09-17 NOTE — Progress Notes (Signed)
Pts O2 sats continue to fluctuate; low of 75 with 4l [place] with exercion; BiPap on, up to 88-90 with occasional desat to 82-83. Resp in as well as Pulmonary.  Order to tx to ICU for closer monitoring. Transferred to Room 2So02 without incident. VSS. Husband aware.

## 2016-09-17 NOTE — H&P (Addendum)
PULMONARY / CRITICAL CARE MEDICINE   Name: Cheryl Wheeler MRN: [id number] DOB: 10/14/1965    ADMISSION DATE:  09/09/2016  REFERRING MD:  Dr. Posey Pronto   CHIEF COMPLAINT:  Dyspnea  BRIEF:  Cheryl Wheeler is a 50 y.o. 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   On 08/22/2016, patient presented to [facility] with dyspnea, dry cough and subjective fever and hypoxia to 70's. Initial ABG was 7.4/66/45/41 at [facility]. CBC significant for mild leukocytosis. BMP remarkable for hypoosmolar hyponatremia to 128 otherwise within normal limits. UA was remarkable for pyuria but urine culture was negative. She was started on Solu-Medrol, DuoNeb, Pulmicort and guanfacine-DM for COPD exacerbation. However, she continued to have worsening dyspnea with increased oxygen requirement on BiPAP continuously. She was also started on cefepime and vancomycin on 08/24/2016.  On the day of transfer, vitals and was normal limits except for tachypnea to 25, ABG was 7.4/65/75/41/ Last BMP within normal limits. Mild leukocytosis and hyponatremia resolved.   On arrival, patient was on BiPAP with some increased work of breathing. She reported headache at the site where the the BiPAP mask rests on her forehead, denies chest pain, admits shortness of breath, denies abdominal pain, denies dysuria and admits chronic back pain. She reports swelling in the right foot and right calf for about a month prior to presentation to [facility] that has resolved.  Patient likes stated she would want  intubation or trach done.   SIGNIFICANT EVENTS: 11/12 - Admitted to [facility] where his acute on chronic COPD exacerbation and hypoosmolar hyponatremia 11/14 - Started on vancomycin and  cefepime 11/16 - Transferred to [facility] ICU for possible tracheostomy due to continuous need for BiPAP and increased oxygen requirement.  11/17 - Intubated. CT chest negative for PE.  11/18 - Bronch RML, no DAH, some erthyema blood, BAL sent, tolerated 11/24 - extubated  11/26 - TRH primary  11/30 - Transfer to inpatient rehab   SUBJECTIVE:  Off and on BiPAP overnight Persistent hypoxemia Persistent shortness of breath  VITAL SIGNS: BP 126/65 (BP Location: Left Arm)   Pulse 90   Temp 98.5 F (36.9 C) (Oral)   Resp 20   Ht 4' 11.5" (1.511 m)   Wt 179 lb 1.6 oz (81.2 kg)   SpO2 100%   BMI 35.57 kg/m   HEMODYNAMICS:    INTAKE / OUTPUT: I/O last 3 completed shifts: In: 110 [P.O.:720] Out: 1 [Stool:1]  PHYSICAL EXAMINATION: General:  Adult female, sitting up in chair, no distress at rest HEENT lips are cyanotic, oropharynx is clear, normocephalic atraumatic Pulmonary: Crackles bases bilaterally, no wheezing, good air movement Cardiovascular regular rate and rhythm, no murmurs gallops or rubs GI: Bowel sounds positive, nontender nondistended Musculoskeletal: Normal bulk and tone Dermatologic: Slightly cyanotic fingers bilaterally Neurologic: Awake, alert, oriented 4  LABS:  BMET  Recent Labs Lab 09/16/16 0516  NA 136  K 3.4*  CL 88*  CO2 35*  BUN 8  CREATININE 0.42*  GLUCOSE 109*    Electrolytes  Recent Labs Lab 09/16/16 0516  CALCIUM 8.7*    CBC  Recent Labs Lab 09/13/16 1524  WBC 5.5  HGB 9.6*  HCT 31.8*  PLT 140*    Coag's No results for input(s): APTT, INR in the last 168 hours.  Sepsis Markers  Recent Labs Lab 09/13/16 1949 09/15/16 0633 09/17/16 0533  PROCALCITON <0.10 <0.10 <0.10    ABG No results for input(s): PHART, PCO2ART, PO2ART in the last 168 hours.  Liver Enzymes No results for input(s): AST, ALT, ALKPHOS, BILITOT, ALBUMIN in the last 168 hours.  Cardiac Enzymes No results for input(s): TROPONINI, PROBNP in the last  168 hours.  Glucose  Recent Labs Lab 09/16/16 0648 09/16/16 1151 09/16/16 1639 09/16/16 2126 09/17/16 0711 09/17/16 1156  GLUCAP 103* 85 99 76 91 100*    Imaging No results found.   STUDIES:  Venous Duplex 11/16: negative for DVT  Bronch 11/18: RML, no DAH, some erythema blood rml  CXR PA/LAT 12/4: . Patchy bilateral opacities. These may have been present to some degree on imaging from her chest x-ray on 11/30. No obvious consolidation or air bronchograms. No pleural effusion. Marked kyphosis. Heart normal in size and mediastinum normal in contour. CT Chest 12/6 > consolidation in upper lobes and left lower lobe. Increase interval and mediastinal lymph nodes, borderline enlarged, maybe reactive   MICROBIOLOGY: Culture at OSH negative MRSA PCR 11/16:  Negative Urine Ctx 11/17:  Negative BAL RML 11/18:  Normal Flora Urine Ctx 12/5 No growth Urine Ctx  11/22:  10,000 colonies yeast Urine Streptococcus 12/4>> neg  Urine Legionella 12/4/>>  ANTIBIOTICS: Rocephin 11/21 - 11/24 Bactrim BID 11/30 >> Cefepime 11/14 - 11/16; 12/ 4 >> Vancomycin 11/14 - 11/16; 12/4 >> Diflucan 09/15/2016>>  SEROLOGIES: 08/2016 Alpha-1 antitrypsin: 120 ANA: Negative Rheumatoid factor: 10.6 C3: 103 C4: 8 ESR 12/4 >> 70 CRP 12/4>> 16.2 DS DNA 12/4>> Histone AB 12/4/>> Smith AB 12/4>>  LINES/TUBES: PIV  Discussion: Pt. Had an episode of desaturation into the 50"s ( per nursing it was sustained) 12/5 early am with rolling to clean patient up..Rapid Response RN was notified. PA and MD assessed patient . CXR was done which indicated Multi focal opacities bilaterally without significant interval change. She was placed back on BiPAP and then this morning on 5 L [place]. Baseline home oxygen is  2 L.This morning she is lethargic and on 5 L saturations are 98%. I have asked the nursing staff to wean oxygen for saturation of 88-92%. I told them 98% was too high for this patient with COPD. Additionally  her husband who is at bedside states that this patient is followed by Dr. Harrietta Guardian, Immunologist at [facility] for autosomal recessive chronic granulomatous disease. She was taking bactrim BID chronically. He states that the patient had  been taking a medication called Itraconazole until last year when it got too costly for them to continue. Since she has been off this medication, she has had multiple pneumonias. Per the husband, starting Jan. 1st, the medication will only cost them $99 per month and they hope to start it back after the new year. As of 09/17/2016 her condition has not improved and she's had intermittent BiPAP use on the floor as well as persistent hypoxemia.   ASSESSMENT / PLAN:   50 y.o. female with history of COPD and chronic hypoxic respiratory failure on 2 L/m at home chronically previously followed by [facility] with Dr. Alcide Clever. Patient also utilizes noninvasive positive pressure ventilation at home. Previous history of chronic granulomatous disease. She now has acute on chronic hypoxic respiratory failure with progressive consolidative patches in her lungs bilaterally in addition to a background of a fine groundglass of uncertain etiology. The differential diagnosis here is broad and may be an underlying malignancy such  as adenocarcinoma versus an invasive fungal process such as aspergillosis versus somehow related to her baseline chronic granulomatous disease. Again, the differential diagnosis includes items apart from this. However, she does not appear to have pneumonia and that she's not febrile nor she producing mucus.   Assessment:  Pulmonary: Acute on chronic respiratory failure with hypoxemia Unexplained pulmonary infiltrates Progressive consolidation bilaterally in pulmonary parenchyma COPD Plan: Continue bronchodilators Moved to the intensive care unit When necessary BiPAP Change antimicrobial coverage to have anti-mold therapy (for  econazole) Check serum galactomannan's (high rate of false positive with beta lactam use), serum histo antigen Follow-up autoimmune panel sent earlier this week Plan for bronchoscopy by Monday, December 11 or sooner if intubated  Cardiovascular: Hypertension  P:  Monitor blood pressure Telemetry  Renal: Hypokalemia  Plan: Repeat basic metabolic panel Monitor BMET and UOP Replace electrolytes as needed   Gastrointestinal: No acute issues GERD  P: NPO  Hematology: No acute issues  P: Check cbc Monitor for bleeding  Infectious A: Possible invasive fungal pulmonary process P: Plan bronch 12/11 Check serum galactomannan Change diflucan to voriconazole  Neuro: A: Bipolar P: Monitor in ICU  My cc time 35 minutes  Roselie Awkward, MD [place] [facility] Pager: [phone] Cell: [phone] After 3pm or if no response, call [phone]

## 2016-09-17 NOTE — Progress Notes (Signed)
Patient arrived on unit. She is a/o x4, vitals stable on 4L n/c. Patient has no IV access d/t infiltration. RN, Nita SellsMaryAnn, was asked to inform IV team of patient's room change.

## 2016-09-17 NOTE — Progress Notes (Signed)
Physical Therapy Session Note  Patient Details  Name: Cheryl Wheeler MRN: [id number] Date of Birth: 1966/09/23  Today's Date: 09/17/2016      Short Term Goals: Week 2:  PT Short Term Goal 1 (Week 2): Pt will perform bed mobility with HOB elevated with min A PT Short Term Goal 2 (Week 2): Pt will perform sit <> stand and stand pivot with RW and mod A PT Short Term Goal 3 (Week 2): Pt will perform w/c mobility x 100' with supervision for UE strengthening and endurance PT Short Term Goal 4 (Week 2): Pt will perform gait x 25' with RW and mod A PT Short Term Goal 5 (Week 2): Pt will negotiate 2 stairs with 2 rails and mod A  Skilled Therapeutic Interventions/Progress Updates:     Patient received Supine in bed with SpO2 dropping from 95% to 84% with conversation on 4L/min supplemental O2. Per RN and chart review, patient is being transported to Acute Care in the near future, and not medically stable for PT at this time.  PT will re-attempt once patient is medically appropriate.   Therapy Documentation Precautions:  Precautions Precautions: Fall Precaution Comments: 5L 02 via nasal cannula  Restrictions Weight Bearing Restrictions: No General: PT Amount of Missed Time (min): 60 Minutes PT Missed Treatment Reason: MD hold (Comment) Vital Signs: Therapy Vitals Pulse Rate: 90 Resp: 20 BP: 126/65 Patient Position (if appropriate): Sitting Oxygen Therapy SpO2: 100 % O2 Device: Nasal Cannula O2 Flow Rate (L/min): 4 L/min   See Function Navigator for Current Functional Status.   Therapy/Group: Individual Therapy  Golden Popustin E Vanna Sailer 09/17/2016, 3:08 PM

## 2016-09-17 NOTE — H&P (Signed)
PULMONARY / CRITICAL CARE MEDICINE   Name: Cheryl Wheeler MRN: [id number] DOB: July 31, 1966    ADMISSION DATE:  09/17/2016  REFERRING MD:  Dr. Posey Pronto   CHIEF COMPLAINT:  Dyspnea  BRIEF:  Cheryl Wheeler is a 50 y.o. 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   On 08/22/2016, patient presented to [facility] with dyspnea, dry cough and subjective fever and hypoxia to 70's. Initial ABG was 7.4/66/45/41 at [facility]. CBC significant for mild leukocytosis. BMP remarkable for hypoosmolar hyponatremia to 128 otherwise within normal limits. UA was remarkable for pyuria but urine culture was negative. She was started on Solu-Medrol, DuoNeb, Pulmicort and guanfacine-DM for COPD exacerbation. However, she continued to have worsening dyspnea with increased oxygen requirement on BiPAP continuously. She was also started on cefepime and vancomycin on 08/24/2016.  On the day of transfer, vitals and was normal limits except for tachypnea to 25, ABG was 7.4/65/75/41/ Last BMP within normal limits. Mild leukocytosis and hyponatremia resolved.   On arrival, patient was on BiPAP with some increased work of breathing. She reported headache at the site where the the BiPAP mask rests on her forehead, denies chest pain, admits shortness of breath, denies abdominal pain, denies dysuria and admits chronic back pain. She reports swelling in the right foot and right calf for about a month prior to presentation to [facility] that has resolved.  Patient likes stated she would want  intubation or trach done.   SIGNIFICANT EVENTS: 11/12 - Admitted to [facility] where his acute on chronic COPD exacerbation and hypoosmolar hyponatremia 11/14 - Started on vancomycin and  cefepime 11/16 - Transferred to [facility] ICU for possible tracheostomy due to continuous need for BiPAP and increased oxygen requirement.  11/17 - Intubated. CT chest negative for PE.  11/18 - Bronch RML, no DAH, some erthyema blood, BAL sent, tolerated 11/24 - extubated  11/26 - TRH primary  11/30 - Transfer to inpatient rehab   SUBJECTIVE:  Off and on BiPAP overnight Persistent hypoxemia Persistent shortness of breath  VITAL SIGNS: BP (!) 116/45   Pulse 75   Temp 97.6 F (36.4 C) (Oral)   Resp (!) 23   Ht 4' 11.5" (1.511 m)   Wt 173 lb 1 oz (78.5 kg)   SpO2 94%   BMI 34.37 kg/m   HEMODYNAMICS:    INTAKE / OUTPUT: No intake/output data recorded.  PHYSICAL EXAMINATION: General:  Adult female, sitting up in chair, no distress at rest HEENT lips are cyanotic, oropharynx is clear, normocephalic atraumatic Pulmonary: Crackles bases bilaterally, no wheezing, good air movement Cardiovascular regular rate and rhythm, no murmurs gallops or rubs GI: Bowel sounds positive, nontender nondistended Musculoskeletal: Normal bulk and tone Dermatologic: Slightly cyanotic fingers bilaterally Neurologic: Awake, alert, oriented 4  LABS:  BMET  Recent Labs Lab 09/16/16 0516  NA 136  K 3.4*  CL 88*  CO2 35*  BUN 8  CREATININE 0.42*  GLUCOSE 109*    Electrolytes  Recent Labs Lab 09/16/16 0516  CALCIUM 8.7*    CBC  Recent Labs Lab 09/13/16 1524  WBC 5.5  HGB 9.6*  HCT 31.8*  PLT 140*    Coag's No results for input(s): APTT, INR in the last 168 hours.  Sepsis Markers  Recent Labs Lab 09/13/16 1949 09/15/16 5681 09/17/16 0533  PROCALCITON <0.10 <0.10 <0.10    ABG No results for input(s): PHART, PCO2ART, PO2ART in the last 168 hours.  Liver Enzymes No results for input(s): AST, ALT, ALKPHOS, BILITOT, ALBUMIN in the last 168 hours.  Cardiac Enzymes No results for input(s): TROPONINI, PROBNP in the last 168 hours.  Glucose  Recent Labs Lab  09/16/16 1151 09/16/16 1639 09/16/16 2126 09/17/16 0711 09/17/16 1156 09/17/16 1613  GLUCAP 85 99 76 91 100* 81    Imaging No results found.   STUDIES:  Venous Duplex 11/16: negative for DVT  Bronch 11/18: RML, no DAH, some erythema blood rml  CXR PA/LAT 12/4: . Patchy bilateral opacities. These may have been present to some degree on imaging from her chest x-ray on 11/30. No obvious consolidation or air bronchograms. No pleural effusion. Marked kyphosis. Heart normal in size and mediastinum normal in contour. CT Chest 12/6 > consolidation in upper lobes and left lower lobe. Increase interval and mediastinal lymph nodes, borderline enlarged, maybe reactive   MICROBIOLOGY: Culture at OSH negative MRSA PCR 11/16:  Negative Urine Ctx 11/17:  Negative BAL RML 11/18:  Normal Flora Urine Ctx 12/5 No growth Urine Ctx  11/22:  10,000 colonies yeast Urine Streptococcus 12/4>> neg  Urine Legionella 12/4/>>  ANTIBIOTICS: Rocephin 11/21 - 11/24 Bactrim BID 11/30 >> Cefepime 11/14 - 11/16; 12/ 4 >> Vancomycin 11/14 - 11/16; 12/4 >> Diflucan 09/15/2016>>  SEROLOGIES: 08/2016 Alpha-1 antitrypsin: 120 ANA: Negative Rheumatoid factor: 10.6 C3: 103 C4: 8 ESR 12/4 >> 70 CRP 12/4>> 16.2 DS DNA 12/4>> Histone AB 12/4/>> Smith AB 12/4>>  LINES/TUBES: PIV  Discussion: Pt. Had an episode of desaturation into the 50"s ( per nursing it was sustained) 12/5 early am with rolling to clean patient up..Rapid Response RN was notified. PA and MD assessed patient . CXR was done which indicated Multi focal opacities bilaterally without significant interval change. She was placed back on BiPAP and then this morning on 5 L [place]. Baseline home oxygen is  2 L.This morning she is lethargic and on 5 L saturations are 98%. I have asked the nursing staff to wean oxygen for saturation of 88-92%. I told them 98% was too high for this patient with COPD. Additionally her husband who is at bedside states that  this patient is followed by Dr. Harrietta Guardian, Immunologist at [facility] for autosomal recessive chronic granulomatous disease. She was taking bactrim BID chronically. He states that the patient had  been taking a medication called Itraconazole until last year when it got too costly for them to continue. Since she has been off this medication, she has had multiple pneumonias. Per the husband, starting Jan. 1st, the medication will only cost them $99 per month and they hope to start it back after the new year. As of 09/17/2016 her condition has not improved and she's had intermittent BiPAP use on the floor as well as persistent hypoxemia.   ASSESSMENT / PLAN:   50 y.o. female with history of COPD and chronic hypoxic respiratory failure on 2 L/m at home chronically previously followed by [facility] with Dr. Alcide Clever. Patient also utilizes noninvasive positive pressure ventilation at home. Previous history of chronic granulomatous disease. She now has acute on chronic hypoxic respiratory failure with progressive consolidative patches in her lungs bilaterally in addition to a background of a fine groundglass of uncertain etiology. The differential diagnosis here is broad and may be an underlying malignancy such as adenocarcinoma versus an invasive fungal process such as aspergillosis  versus somehow related to her baseline chronic granulomatous disease. Again, the differential diagnosis includes items apart from this. However, she does not appear to have pneumonia and that she's not febrile nor she producing mucus.   Assessment:  Pulmonary: Acute on chronic respiratory failure with hypoxemia Unexplained pulmonary infiltrates Progressive consolidation bilaterally in pulmonary parenchyma COPD Plan: Continue bronchodilators Moved to the intensive care unit When necessary BiPAP Change antimicrobial coverage to have anti-mold therapy (for econazole) Check serum galactomannan's (high  rate of false positive with beta lactam use), serum histo antigen Follow-up autoimmune panel sent earlier this week Plan for bronchoscopy by Monday, December 11 or sooner if intubated  Cardiovascular: Hypertension  P:  Monitor blood pressure Telemetry  Renal: Hypokalemia  Plan: Repeat basic metabolic panel Monitor BMET and UOP Replace electrolytes as needed   Gastrointestinal: No acute issues GERD  P: NPO  Hematology: No acute issues  P: Check cbc Monitor for bleeding  Infectious A: Possible invasive fungal pulmonary process P: Plan bronch 12/11 Check serum galactomannan Change diflucan to voriconazole  Neuro: A: Bipolar P: Monitor in ICU  My cc time 35 minutes  Roselie Awkward, MD [place] [facility] Pager: [phone] Cell: [phone] After 3pm or if no response, call [phone]

## 2016-09-17 NOTE — Progress Notes (Signed)
NT informed RN that patient sat was 75% upon assessment pt sat was 72% with Bi-pap. Breathing tx given, sat went up to 94%. Resp. Therapy page, she  Suggested to her on 4L [place], and her sat was 94%. We sontinue to monitor.

## 2016-09-17 NOTE — Progress Notes (Signed)
PULMONARY / CRITICAL CARE MEDICINE   Name: Cheryl Wheeler MRN: [id number] DOB: 09-30-1966    ADMISSION DATE:  09/09/2016  REFERRING MD:  Dr. Posey Pronto   CHIEF COMPLAINT:  Dyspnea  BRIEF:  Cheryl Wheeler is a 50 y.o. 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   On 08/22/2016, patient presented to [facility] with dyspnea, dry cough and subjective fever and hypoxia to 70's. Initial ABG was 7.4/66/45/41 at [facility]. CBC significant for mild leukocytosis. BMP remarkable for hypoosmolar hyponatremia to 128 otherwise within normal limits. UA was remarkable for pyuria but urine culture was negative. She was started on Solu-Medrol, DuoNeb, Pulmicort and guanfacine-DM for COPD exacerbation. However, she continued to have worsening dyspnea with increased oxygen requirement on BiPAP continuously. She was also started on cefepime and vancomycin on 08/24/2016.  On the day of transfer, vitals and was normal limits except for tachypnea to 25, ABG was 7.4/65/75/41/ Last BMP within normal limits. Mild leukocytosis and hyponatremia resolved.   On arrival, patient was on BiPAP with some increased work of breathing. She reported headache at the site where the the BiPAP mask rests on her forehead, denies chest pain, admits shortness of breath, denies abdominal pain, denies dysuria and admits chronic back pain. She reports swelling in the right foot and right calf for about a month prior to presentation to [facility] that has resolved.  Patient likes stated she would want  intubation or trach done.   SIGNIFICANT EVENTS: 11/12 - Admitted to [facility] where his acute on chronic COPD exacerbation and hypoosmolar hyponatremia 11/14 - Started on vancomycin and  cefepime 11/16 - Transferred to [facility] ICU for possible tracheostomy due to continuous need for BiPAP and increased oxygen requirement.  11/17 - Intubated. CT chest negative for PE.  11/18 - Bronch RML, no DAH, some erthyema blood, BAL sent, tolerated 11/24 - extubated  11/26 - TRH primary  11/30 - Transfer to inpatient rehab   SUBJECTIVE:  Sitting in wheelchair, wearing BiPAP with 6L oxygen.after therapy. States she is doing ok, but breathing is still problematic. Switches  between [place] and BiPAP as needed with exertion and fatigue. No acute events. Of Note Morphine and Risperdal were restarted today by primary team for chronic pain.   VITAL SIGNS: BP (!) 125/57 (BP Location: Left Arm)   Pulse 90   Temp 98.5 F (36.9 C) (Oral)   Resp 20   Ht 4' 11.5" (1.511 m)   Wt 179 lb 1.6 oz (81.2 kg)   SpO2 96%   BMI 35.57 kg/m   HEMODYNAMICS:    INTAKE / OUTPUT: I/O last 3 completed shifts: In: 50 [P.O.:720] Out: 1 [Stool:1]  PHYSICAL EXAMINATION: General:  Adult female, in wheelchair, on BiPap, flat affect Integument:  Warm & dry, intact  HEENT:  Normocephalic.  Cardiovascular:  RRR, no MRG. Trace edema. Pulmonary:  Diminished breath sounds bilaterally. Normal work of breathing on BiPAP. Significant leak around mask. Abdomen: Soft. Non-tender, active bowel sounds.  Neurological: Alert and oriented, Following commands.  LABS:  BMET  Recent Labs Lab 09/16/16 0516  NA 136  K 3.4*  CL 88*  CO2 35*  BUN 8  CREATININE 0.42*  GLUCOSE 109*    Electrolytes  Recent Labs Lab 09/16/16 0516  CALCIUM 8.7*    CBC  Recent  Labs Lab 09/13/16 1524  WBC 5.5  HGB 9.6*  HCT 31.8*  PLT 140*    Coag's No results for input(s): APTT, INR in the last 168 hours.  Sepsis Markers  Recent Labs Lab 09/13/16 1949 09/15/16 0633 09/17/16 0533  PROCALCITON <0.10 <0.10 <0.10    ABG No results for input(s): PHART, PCO2ART, PO2ART in the last 168 hours.  Liver Enzymes No  results for input(s): AST, ALT, ALKPHOS, BILITOT, ALBUMIN in the last 168 hours.  Cardiac Enzymes No results for input(s): TROPONINI, PROBNP in the last 168 hours.  Glucose  Recent Labs Lab 09/15/16 2129 09/16/16 0648 09/16/16 1151 09/16/16 1639 09/16/16 2126 09/17/16 0711  GLUCAP 98 103* 85 99 76 91    Imaging No results found.   STUDIES:  Venous Duplex 11/16: negative for DVT  Bronch 11/18: RML, no DAH, some erythema blood rml  CXR PA/LAT 12/4: . Patchy bilateral opacities. These may have been present to some degree on imaging from her chest x-ray on 11/30. No obvious consolidation or air bronchograms. No pleural effusion. Marked kyphosis. Heart normal in size and mediastinum normal in contour. CT Chest 12/6 > consolidation in upper lobes and left lower lobe. Increase interval and mediastinal lymph nodes, borderline enlarged, maybe reactive   MICROBIOLOGY: Culture at OSH negative MRSA PCR 11/16:  Negative Urine Ctx 11/17:  Negative BAL RML 11/18:  Normal [place]ora Urine Ctx 12/5 No growth Urine Ctx  11/22:  10,000 colonies yeast Urine Streptococcus 12/4>> neg  Urine Legionella 12/4/>>  ANTIBIOTICS: Rocephin 11/21 - 11/24 Bactrim BID 11/30 >> Cefepime 11/14 - 11/16; 12/ 4 >> Vancomycin 11/14 - 11/16; 12/4 >> Diflucan 09/15/2016>>  SEROLOGIES: 08/2016 Alpha-1 antitrypsin: 120 ANA: Negative Rheumatoid factor: 10.6 C3: 103 C4: 8 ESR 12/4 >> 70 CRP 12/4>> 16.2 DS DNA 12/4>> Histone AB 12/4/>> Smith AB 12/4>>  LINES/TUBES: PIV  Discussion: Pt. Had an episode of desaturation into the 50"s ( per nursing it was sustained) 12/5 early am with rolling to clean patient up..Rapid Response RN was notified. PA and MD assessed patient . CXR was done which indicated Multi focal opacities bilaterally without significant interval change. She was placed back on BiPAP and then this morning on 5 L [place]. Baseline home oxygen is  2 L.This morning she is lethargic and on 5 L  saturations are 98%. I have asked the nursing staff to wean oxygen for saturation of 88-92%. I told them 98% was too high for this patient with COPD. Additionally her husband who is at bedside states that this patient is followed by Dr. Harrietta Guardian, Immunologist at [facility] for autosomal recessive chronic granulomatous disease. She was taking bactrim BID chronically. He states that the patient had  been taking a medication called Itraconazole until last year when it got too costly for them to continue. Since she has been off this medication, she has had multiple pneumonias. Per the husband, starting Jan. 1st, the medication will only cost them $99 per month and they hope to start it back after the new year.   ASSESSMENT / PLAN:   50 y.o. female with history of COPD and chronic hypoxic respiratory failure on 2 L/m at home chronically. Being followed by [facility] with Dr. Alcide Clever. Patient also utilizes noninvasive positive pressure ventilation at home. Previous history of chronic granulomatous disease. Developing acute on chronic hypoxic respiratory failure. Multifocal opacities could certainly represent a developing multifocal pneumonia. Remains afebrile with no elevation in WBC. As there are   no  other infectious symptoms,  alternative etiologies must be considered.  1. Acute on chronic hypoxic respiratory failure: Recommend continuing to wean FiO2 for saturations of 88-92% & encourage incentive spirometry and out of bed to minimize atelectasis. - OOB to chair, IS q1H while awake. Continue BiPap as needed for desaturations.   2. Continued Bilateral lung opacities: Pending serum autoimmune workup with ESR, CRP, double-stranded DNA antibody, histone antibody, and Smith antibody. - Consider restarting Itraconazole. Diflucan added 09/15/2016 for fungal coverage. ( pt had been on itraconozole   Chronically until cost became prohibitive in 2016)  Intermittent CXR's, will order 12/9. If  after 8 day trial on Diflucan she does not show improvement, consider FOB.  3. Possible HCAP: Continue  broad-spectrum antibiotics with vancomycin and cefepime. Procal negative, WBC WNL, remains afebrile. This does not seem to be an infectious process. Consider this may be an autoimmune granuloma (which is a non-infectious process seen in CGD).  Low threshold to de-escalate antibiotics.  Urine Legionella and sputum culture pending.   4. COPD: No signs of exacerbation. Continuing Pulmicort nebulized twice a day & DuoNeb nebulized 4 times a day.   Of note patient I&O is documented as positive , but patient is incontinent, so true  I&O balance is unknown. She is receiving  daily Lasix 40 mg once daily. Significant mask leak noted around BiPap mask. Will have RT adjust for better seal, and asking husband to bring mask from home. Affect remains very flat today. She continues to desaturate with exertion.  Spoke with primary team about monitoring lytes while on Lasix.  Last Mag and Phos were WNL on 09/06/16  Remainder of care as per primary service.  Magdalen Spatz, AGACNP-BC [place] Pgr: [phone]

## 2016-09-17 NOTE — Progress Notes (Signed)
eLink Physician-Brief Progress Note Patient Name: Cheryl Wheeler DOB: [birth year] MRN: [id number]   Date of Service  09/17/2016  HPI/Events of Note  Hypokalemia and hypomag  eICU Interventions  Potassium and mag replaced     Intervention Category Intermediate Interventions: Electrolyte abnormality - evaluation and management  DETERDING,ELIZABETH 09/17/2016, 11:16 PM

## 2016-09-17 NOTE — Discharge Summary (Signed)
Discharge summary job # [id number]

## 2016-09-17 NOTE — Progress Notes (Signed)
Social Work  Discharge Note  The overall goal for the admission was met for:   Discharge location: No - transfer back to acute due to medical issues  Length of Stay: No - transfer back to acute  (CIR targeted d/c date had been 12/22 prior to medical issues arising)  Discharge activity level: No  Home/community participation: No  Services provided included: MD, RD, PT, OT, RN, TR, Pharmacy and [place]:  [employer] Medicare  Follow-up services arranged: NA  Comments (or additional information):  D'c plan from CIR was to d/c home with husband who can provide 24/7 assistance.  Hope to see pt return to CIR if medically necessary.  Patient/Family verbalized understanding of follow-up arrangements: NA  Individual responsible for coordination of the follow-up plan: NA  Confirmed correct DME delivered: NA    Cheryl Wheeler

## 2016-09-17 NOTE — Progress Notes (Signed)
eLink Physician-Brief Progress Note Patient Name: Cheryl NissenJonce Ann Manheim DOB: Jun 02, 1966 MRN: [id number]   Date of Service  09/17/2016  HPI/Events of Note  Patient NPO. Currently on several PO medications.   eICU Interventions  Will make patient NPO excepts meds with sips.      Intervention Category Intermediate Interventions: Other:  Lenell AntuSommer,Steven Eugene 09/17/2016, 7:53 PM

## 2016-09-17 NOTE — Progress Notes (Signed)
eLink Physician-Brief Progress Note Patient Name: Cheryl NissenJonce Ann Wheeler DOB: [birth year] MRN: [id number]   Date of Service  09/17/2016  HPI/Events of Note  Discharged from rehab and admitted [facility]. No admission orders.  eICU Interventions  Will order: 1. BIPAP and Pillow O2 orders. 2. Continue current antibiotic Rx with Vancomycin and Cefepime per pharmacy consult.  3. Add Voriconizole per Dr. Ulyses JarredMcQuaid's note. 4. Duonebs Q 6 hours.      Intervention Category Major Interventions: Infection - evaluation and management Intermediate Interventions: Respiratory distress - evaluation and management  Traniece Boffa Eugene 09/17/2016, 4:40 PM

## 2016-09-17 NOTE — Progress Notes (Signed)
Update: The decision was made to transfer Cheryl Wheeler to the ICU due to continued Acute on Chronic Respiratory Failure with plans for Bi Pap prn to maintain oxygen saturations 88-93%, initiation of Voriconazole IV and bronchoscopy 09/20/2016 to further evaluate etiology of persistent  of bilateral multifocal opacities.Transfer orders have been written and patient will be discharged form rehab and readmitted to the ICU. I have spoken with the patient and the nursing staff on rehab will notify her husband.

## 2016-09-17 NOTE — Progress Notes (Signed)
Occupational Therapy Session Note  Patient Details  Name: Cheryl Wheeler MRN: [id number] Date of Birth: 07-12-1966  Today's Date: 09/17/2016 OT Individual Time: 1111-1208 OT Individual Time Calculation (min): 57 min    Short Term Goals: Week 1:  OT Short Term Goal 1 (Week 1): Pt will complete bathing with Min A and AE PRN OT Short Term Goal 1 - Progress (Week 1): Not met OT Short Term Goal 2 (Week 1): Pt will complete LB dressing with Mod A  OT Short Term Goal 2 - Progress (Week 1): Not met OT Short Term Goal 3 (Week 1): Pt will stand for 3 minutes while engaged in unilaterally involved task to increase independence with LB self care OT Short Term Goal 3 - Progress (Week 1): Not met OT Short Term Goal 4 (Week 1): Pt will complete BSC transfer and LRAD with Mod A OT Short Term Goal 4 - Progress (Week 1): Not met Week 2:  OT Short Term Goal 1 (Week 2): Pt will engage in 5 minutes of functional task with less than 2 rest breaks. OT Short Term Goal 2 (Week 2): Pt will perform bathing from sink with mod A in order to decrease level of assist with self care. OT Short Term Goal 3 (Week 2): Pt will perform toilet transfer with mod A in order to decrease level of assist for functional transfers. OT Short Term Goal 4 (Week 2): Pt will perform LB dressing with max A in order to decrease level of assist with functional task.   Skilled Therapeutic Interventions/Progress Updates:   Pt was lying in bed at time of arrival, reported feeling better than she did earlier this morning with therapy. Resting 02 on 5L 93%. Pt completed supine<sit with steady assist and extra time. Squat pivot transfer completed with overall Max A with instruction on technique. After completing oral care at sink w/c level with setup, pt was taken to dayroom to complete w/c obstacle course while stopping to retrieve items along pathway. Tx focus on UB strengthening and activity tolerance. Pt required min cuing for memory/sequencing  and mod cues for problem solving methods to avoid environmental obstacles. Pt exhibited difficultly with locking w/c brakes due to hand strength deficits. 02 sats during task 88-91% on 4L (per RN instruction). RN notified regarding 02 sats during activity. Multiple rest breaks provided to accommodate fatigue today. Afterwards pt was taken back to room and was left in care of nursing and all needs within reach at time of departure.   Therapy Documentation Precautions:  Precautions Precautions: Fall Precaution Comments: 5L 02 via nasal cannula  Restrictions Weight Bearing Restrictions: No General:   Vital Signs: Therapy Vitals Pulse Rate: 90 Resp: 20 Oxygen Therapy SpO2: 96 % O2 Device: Bi-PAP O2 Flow Rate (L/min): 6 L/min Pain: No c/o pain during session    ADL: ADL ADL Comments: Please see functional navigator for ADL status:    See Function Navigator for Current Functional Status.   Therapy/Group: Individual Therapy  Haron Beilke A Jolissa Kapral 09/17/2016, 12:23 PM

## 2016-09-17 NOTE — Progress Notes (Signed)
[place] PHYSICAL MEDICINE & REHABILITATION     PROGRESS NOTE  Subjective/Complaints:  Pt seen sitting up in bed this AM.  She states she feels she is doing better.  She slept well overnight.  She states she feels a little stronger.    ROS: Denies CP, N/V/D.  Objective: Vital Signs: Blood pressure (!) 125/57, pulse 89, temperature 98.5 F (36.9 C), temperature source Oral, resp. rate 20, height 4' 11.5" (1.511 m), weight 81.2 kg (179 lb 1.6 oz), SpO2 97 %. No results found. No results for input(s): WBC, HGB, HCT, PLT in the last 72 hours.  Recent Labs  09/16/16 0516  NA 136  K 3.4*  CL 88*  GLUCOSE 109*  BUN 8  CREATININE 0.42*  CALCIUM 8.7*   CBG (last 3)   Recent Labs  09/16/16 1639 09/16/16 2126 09/17/16 0711  GLUCAP 99 76 91    Wt Readings from Last 3 Encounters:  09/17/16 81.2 kg (179 lb 1.6 oz)  09/09/16 81.8 kg (180 lb 5.4 oz)    Physical Exam:  BP (!) 125/57 (BP Location: Left Arm)   Pulse 89   Temp 98.5 F (36.9 C) (Oral)   Resp 20   Ht 4' 11.5" (1.511 m)   Wt 81.2 kg (179 lb 1.6 oz)   SpO2 97%   BMI 35.57 kg/m  Constitutional: She appears well-developed. NAD.  HENT: Normocephalic. Atraumatic. Eyes: EOMare normal. No discharge.  Cardiovascular: RRR. No JVD. Respiratory: +[place]. Unlabored. CTA B/l. GI: Soft. Bowel sounds are normal.   Neurological: She is alertand oriented. Motor: B/l UE: 4-/5 deltoid, 4/5 bicep, tricep and 4/5 wrist and HI (unchanged).  B/l LE: 3+/5 HF, 3+/5 KE and 3+/5 ADF/PF.  Skin: Skin is warmand dry.  Psychiatric: She has a normal mood and affect. Her behavior is normal  Assessment/Plan: 1. 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 Physiatrist is providing close team supervision and 24 hour management of active medical problems listed below. Physiatrist and rehab team  continue to assess barriers to discharge/monitor patient progress toward functional and medical goals.  Function:  Bathing Bathing position Bathing activity did not occur:  (UB only) Position: Wheelchair/chair at sink  Bathing parts Body parts bathed by patient: Right arm, Left arm, Chest, Abdomen (4/4 parts) Body parts bathed by helper: Buttocks, Back  Bathing assist Assist Level: Set up   Set up : To obtain items  Upper Body Dressing/Undressing Upper body dressing   What is the patient wearing?: Pull over shirt/dress     Pull over shirt/dress - Perfomed by patient: Thread/unthread right sleeve, Thread/unthread left sleeve, Put head through opening, Pull shirt over trunk          Upper body assist Assist Level: Set up      Lower Body Dressing/Undressing Lower body dressing   What is the patient wearing?: Underwear, Pants, Non-skid slipper socks Underwear - Performed by patient: Thread/unthread left underwear leg, Thread/unthread right underwear leg Underwear - Performed by helper: Pull underwear up/down Pants- Performed by patient: Thread/unthread right pants leg, Thread/unthread left pants leg Pants- Performed by helper: Pull pants up/down   Non-skid slipper socks- Performed by helper: Don/doff right sock, Don/doff left sock                  Lower body assist Assist for lower body dressing:  (Mod A)      Toileting Toileting  Toileting steps completed by patient: Adjust clothing prior to toileting Toileting steps completed by helper: Adjust clothing prior to toileting, Performs perineal hygiene, Adjust clothing after toileting Toileting Assistive Devices: Grab bar or rail  Toileting assist     Transfers Chair/bed transfer   Chair/bed transfer method: Ambulatory Chair/bed transfer assist level: Touching or steadying assistance (Pt > 75%) Chair/bed transfer assistive device: Patent attorneyWalker     Locomotion Ambulation     Max distance: 12 Assist level: Touching or  steadying assistance (Pt > 75%)   Wheelchair   Type: Manual Max wheelchair distance: 60 Assist Level: Supervision or verbal cues  Cognition Comprehension Comprehension assist level: Understands basic 50 - 74% of the time/ requires cueing 25 - 49% of the time  Expression Expression assist level: Expresses basic 50 - 74% of the time/requires cueing 25 - 49% of the time. Needs to repeat parts of sentences.  Social Interaction Social Interaction assist level: Interacts appropriately 50 - 74% of the time - May be physically or verbally inappropriate.  Problem Solving Problem solving assist level: Solves basic 50 - 74% of the time/requires cueing 25 - 49% of the time  Memory Memory assist level: Recognizes or recalls 50 - 74% of the time/requires cueing 25 - 49% of the time    Medical Problem List and Plan: 1. 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 Oxygen dependent prior to admission with BiPAP at home  Cont CIR 2. DVT Prophylaxis/Anticoagulation: SCDs.  3. Pain Management/chronic back pain:   MSIR 15 mg twice a day changed to PRN 4. Mood/bipolar disorder:   Remeron 30 mg daily,   Risperdal 2 mg twice a day,   Depakene 500 mg twice a day 5. Neuropsych: This patient iscapable of making decisions on herown behalf. 6. Skin/Wound Care: Routine skin checks 7. Fluids/Electrolytes/Nutrition: Routine I&Os 8.Chronic granulomatous disease.   Continue Bactrim prophylaxis  See #16 9.Diabetes mellitus and peripheral neuropathy. Hemoglobin A1c 6.5. Lantus insulin 14 units daily, NovoLog 5 units 3 times a day. Check blood sugars before meals and at bedtime  Relatively controlled 12/8 10.Hypertension. Labetalol 300 mg 3 times a day, . Monitor with increased mobility  Controlled on 12/8 11.Acute on chronic diastolic congestive heart failure. Lasix 40 mg daily. Monitor for any signs of fluid overload   Filed Weights    09/15/16 0500 09/16/16 0422 09/17/16 0611  Weight: 83.4 kg (183 lb 13.8 oz) 81.2 kg (179 lb) 81.2 kg (179 lb 1.6 oz)   12.Hyperlipidemia. Lipitor 13. Urinary incontinence.   UA neg, C&S neg 12/6  PVRs remain pending 14. ABLA  Hb 9.6 on 12/4  Cont to monitor 15. Hypoalbuminemia  Supplement initiated 12/1 16. ?HCAP  CXR reviewed, repeat CXR 12/5 reviewed, no interval change - b/l opacities  CT chest 12/5 reviewed, opacities B/l upper lobes  Sputum culture pending  Cont Vanc, Cefepime, fluconazole added - Vanc level low   Allergic to fluoroquinolones  Appreciate Pulm recs - autoimmune workup, recent workup negative to date  12/4 required Bipap for desats and assessed by rapid response  LOS (Days) 8 A FACE TO FACE EVALUATION WAS PERFORMED  Ankit Karis Jubanil Patel 09/17/2016 8:52 AM

## 2016-09-17 NOTE — Progress Notes (Signed)
Occupational Therapy Session Note  Patient Details  Name: Cheryl Wheeler MRN: [id number] Date of Birth: 03-06-1966  Today's Date: 09/17/2016 OT Individual Time: 0800-0910 OT Individual Time Calculation (min): 70 min     Skilled Therapeutic Interventions/Progress Updates:   1:1 self care retraining at sink level. Pt able to perform stand pivot transfer with level surfaces with min A. Pt continues to require more than reasonable amt time to rest to "catch her breath." Pt's sats remained in low 80s up to 88% on 4 liters. Rn and PA made aware. Pt able to thread bilateral pant legs with extra time and then after a rest break pt desats to 82%. Discussed with RN and BiPAP placed on pt but sats did not come back up; they dropped to 74% on BiPAP. RN came and with RT. Left pt on BiPAP in w/c.   Therapy Documentation Precautions:  Precautions Precautions: Fall Precaution Comments: 5L 02 via nasal cannula  Restrictions Weight Bearing Restrictions: No    Vital Signs: Therapy Vitals Temp: 98.5 F (36.9 C) Temp Source: Oral Pulse Rate: 89 Resp: 20 BP: (!) 125/57 Patient Position (if appropriate): Lying Oxygen Therapy SpO2: 97 % O2 Device: Nasal Cannula O2 Flow Rate (L/min): 4 L/min Pain:  lower back - already took peds per pt  See Function Navigator for Current Functional Status.   Therapy/Group: Individual Therapy  Cheryl Wheeler, Cheryl Wheeler 09/17/2016, 8:11 AM

## 2016-09-17 NOTE — Progress Notes (Signed)
RT called to pt's room due to desat into the mid 70% range. Pt had been working with OT.  Pt placed on Bipap by with 6Lpm. Pt received scheduled Duoneb.  Sat recovered to 94%.  RT requested continuous pulse oximetry.  MD discussed consulting Pulmonary for an evaulation.

## 2016-09-17 NOTE — Progress Notes (Addendum)
Pharmacy Antibiotic Note  [age] YOF with chronic granulomatous disease and maintained on Bactrim for prophylaxis followed by immunologist as outpatient, [facility] on [facility]16 with acute on chronic respiratory failure required intubation and broad spectrum abx, then transferred to CIR, while in CIR, repeat Xray with Multifocal patchy opacities in the bilateral upper lobes and left lower lobe, suspicious for pneumonia, and restarted vancomycin and cefepime on 12/4. He continue to have worsening respiratory failure, and transferred back to ICU today. Pharmacy is consulted to continue manage vancomycin and cefepime dosing and also add voriconazol  Plan: - Continue vancomycin to 1g iv q8h.  Repeat vancomycin trough at steady state. - Cefepime 2g IV Q 12hrs - voriconazole 470 mg (6mg /kg) Q 12 x 2 then 310 mg (4mg /kg) Q 12 hrs - BMET Q 72 hrs    Height: 4' 11.5" (151.1 cm) Weight: 173 lb 1 oz (78.5 kg) IBW/kg (Calculated) : 44.35  Temp (24hrs), Avg:98.1 F (36.7 C), Min:97.6 F (36.4 C), Max:98.5 F (36.9 C)   Recent Labs Lab 09/13/16 1524 09/16/16 0516 09/16/16 1430  WBC 5.5  --   --   CREATININE  --  0.42*  --   VANCOTROUGH  --   --  8*    Estimated Creatinine Clearance: 76.2 mL/min (by C-G formula based on SCr of 0.42 mg/dL (L)).    Allergies  Allergen Reactions  . Fentanyl Shortness Of Breath  . Meperidine Other (See Comments)    chest pain-"feels like squeezing heart"  . Azithromycin Other (See Comments)    VRE  . Codeine Nausea Only  . Levofloxacin Other (See Comments)    Unknown  . Other Other (See Comments)    BLACK STICHES=BODY REJECTS THEM  . Penicillins Rash  . Sulfa Antibiotics Rash    Tolerates Bactrim    Antimicrobials this admission:  Vancomycin 12/4 >> Zosyn 12/4 >> Fluconazole 12/06 >> 12/8 Voriconazole 12/8 >> On chronic Bactrim    Dose adjustments this admission:  12/07: VT 8 (Goal 15-20) > change to 1g iv q8h  Microbiology  results:  12/5 UCx: neg 12/5 Sputum: pending 12/8 aspergillus Ag: pending Thank you for allowing pharmacy to be a part of this patient's care.  Bayard HuggerMei Harvis Mabus, PharmD, BCPS  Clinical Pharmacist  Pager: [phone]  09/17/2016 4:56 PM

## 2016-09-18 DIAGNOSIS — J9621 Acute and chronic respiratory failure with hypoxia: Secondary | ICD-10-CM

## 2016-09-18 DIAGNOSIS — J189 Pneumonia, unspecified organism: Secondary | ICD-10-CM

## 2016-09-18 LAB — GLUCOSE, CAPILLARY
Glucose-Capillary: 80 mg/dL (ref 65–99)
Glucose-Capillary: 120 mg/dL — ABNORMAL HIGH (ref 65–99)
Glucose-Capillary: 116 mg/dL — ABNORMAL HIGH (ref 65–99)
Glucose-Capillary: 119 mg/dL — ABNORMAL HIGH (ref 65–99)

## 2016-09-18 LAB — BASIC METABOLIC PANEL
ANION GAP: 11 (ref 5–15)
Anion gap: 8 (ref 5–15)
BUN: 6 mg/dL (ref 6–20)
BUN: 5 mg/dL — ABNORMAL LOW (ref 6–20)
CALCIUM: 8.6 mg/dL — AB (ref 8.9–10.3)
CALCIUM: 8.1 mg/dL — AB (ref 8.9–10.3)
CO2: 36 mmol/L — AB (ref 22–32)
CO2: 33 mmol/L — AB (ref 22–32)
CREATININE: 0.4 mg/dL — AB (ref 0.44–1.00)
Chloride: 90 mmol/L — ABNORMAL LOW (ref 101–111)
Chloride: 98 mmol/L — ABNORMAL LOW (ref 101–111)
Creatinine, Ser: 0.42 mg/dL — ABNORMAL LOW (ref 0.44–1.00)
GFR calc non Af Amer: >60 mL/min (ref >60)
GLUCOSE: 74 mg/dL (ref 65–99)
GLUCOSE: 72 mg/dL (ref 65–99)
POTASSIUM: 3 mmol/L — AB (ref 3.5–5.1)
Potassium: 4.4 mmol/L (ref 3.5–5.1)
Sodium: 137 mmol/L (ref 135–145)
Sodium: 139 mmol/L (ref 135–145)

## 2016-09-18 LAB — CBC
HEMATOCRIT: 29.1 % — AB (ref 36.0–46.0)
Hemoglobin: 8.8 g/dL — ABNORMAL LOW (ref 12.0–15.0)
MCH: 25.7 pg — ABNORMAL LOW (ref 26.0–34.0)
MCHC: 30.2 g/dL (ref 30.0–36.0)
MCV: 84.8 fL (ref 78.0–100.0)
PLATELETS: 187 10*3/uL (ref 150–400)
RBC: 3.43 MIL/uL — AB (ref 3.87–5.11)
RDW: 18.8 % — AB (ref 11.5–15.5)
WBC: 4.3 10*3/uL (ref 4.0–10.5)

## 2016-09-18 MED ORDER — BOOST / RESOURCE BREEZE PO LIQD
1.0000 | Freq: Three times a day (TID) | ORAL | Status: DC
  Administered 2016-09-18 – 2016-09-26 (×11): 1 via ORAL
  Administered 2016-09-26 (×2): via ORAL
  Administered 2016-09-27 – 2016-09-28 (×4): 1 via ORAL

## 2016-09-18 NOTE — H&P (Signed)
PULMONARY / CRITICAL CARE MEDICINE   Name: Cheryl Wheeler MRN: [id number] DOB: 05/27/1966    ADMISSION DATE:  09/17/2016  REFERRING MD:  Dr. Posey Pronto   CHIEF COMPLAINT:  Dyspnea  BRIEF:  Cheryl Wheeler is a 50 y.o. 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   On 08/22/2016, patient presented to [facility] with dyspnea, dry cough and subjective fever and hypoxia to 70's. Initial ABG was 7.4/66/45/41 at [facility]. CBC significant for mild leukocytosis. BMP remarkable for hypoosmolar hyponatremia to 128 otherwise within normal limits. UA was remarkable for pyuria but urine culture was negative. She was started on Solu-Medrol, DuoNeb, Pulmicort and guanfacine-DM for COPD exacerbation. However, she continued to have worsening dyspnea with increased oxygen requirement on BiPAP continuously. She was also started on cefepime and vancomycin on 08/24/2016.  On the day of transfer, vitals and was normal limits except for tachypnea to 25, ABG was 7.4/65/75/41/ Last BMP within normal limits. Mild leukocytosis and hyponatremia resolved.   On arrival, patient was on BiPAP with some increased work of breathing. She reported headache at the site where the the BiPAP mask rests on her forehead, denies chest pain, admits shortness of breath, denies abdominal pain, denies dysuria and admits chronic back pain. She reports swelling in the right foot and right calf for about a month prior to presentation to [facility] that has resolved.  Patient likes stated she would want  intubation or trach done.   SIGNIFICANT EVENTS: 11/12 - Admitted to [facility] where his acute on chronic COPD exacerbation and hypoosmolar hyponatremia 11/14 - Started on vancomycin and  cefepime 11/16 - Transferred to [facility] ICU for possible tracheostomy due to continuous need for BiPAP and increased oxygen requirement.  11/17 - Intubated. CT chest negative for PE.  11/18 - Bronch RML, no DAH, some erthyema blood, BAL sent, tolerated 11/24 - extubated  11/26 - TRH primary  11/30 - Transfer to inpatient rehab   SUBJECTIVE:  Off BIPAP this am , sats adequate.  Alert /appropriate , follows commands.  Decreased dsypnea , hungry would like to eat    VITAL SIGNS: BP (!) 112/50   Pulse 80   Temp 98.1 F (36.7 C)   Resp 17   Ht 4' 11.5" (1.511 m)   Wt 81.6 kg (179 lb 14.3 oz)   SpO2 97%   BMI 35.73 kg/m   HEMODYNAMICS:    INTAKE / OUTPUT: I/O last 3 completed shifts: In: 744 [IV Piggyback:744] Out: -   PHYSICAL EXAMINATION: General:  Adult female, sitting up in bed, no distress HEENT l oropharynx is clear, normocephalic atraumatic Pulmonary: Crackles bases bilaterally, no wheezing, Cardiovascular regular rate and rhythm, no murmurs gallops or rubs GI: Bowel sounds positive, nontender nondistended Musculoskeletal: Normal bulk and tone Dermatologic: Slightly cyanotic fingers bilaterally Neurologic: Awake, alert, oriented 4  LABS:  BMET  Recent Labs Lab 09/17/16 2222 09/18/16 0135 09/18/16 0904  NA 137 137 139  K 2.8* 3.0* 4.4  CL 90* 90* 98*  CO2 38* 36* 33*  BUN 7 6 5*  CREATININE 0.43* 0.42* 0.40*  GLUCOSE 113* 74 72    Electrolytes  Recent Labs Lab 09/17/16 2222 09/18/16 0135 09/18/16 0904  CALCIUM 8.7* 8.6* 8.1*  MG 1.6*  --   --   PHOS 3.8  --   --  CBC  Recent Labs Lab 09/13/16 1524 09/17/16 2222 09/18/16 0135  WBC 5.5 4.2 4.3  HGB 9.6* 8.9* 8.8*  HCT 31.8* 29.5* 29.1*  PLT 140* 159 187    Coag's No results for input(s): APTT, INR in the last 168 hours.  Sepsis Markers  Recent Labs Lab 09/15/16 0633 09/17/16 0533 09/17/16 2222  PROCALCITON <0.10 <0.10 <0.10    ABG No results for input(s): PHART, PCO2ART,  PO2ART in the last 168 hours.  Liver Enzymes No results for input(s): AST, ALT, ALKPHOS, BILITOT, ALBUMIN in the last 168 hours.  Cardiac Enzymes No results for input(s): TROPONINI, PROBNP in the last 168 hours.  Glucose  Recent Labs Lab 09/16/16 2126 09/17/16 0711 09/17/16 1156 09/17/16 1613 09/17/16 2138 09/18/16 0824  GLUCAP 76 91 100* 81 80 80    Imaging Dg Chest Port 1 View  Result Date: 09/17/2016 CLINICAL DATA:  Respiratory distress. EXAM: PORTABLE CHEST 1 VIEW COMPARISON:  Radiographs and CT 09/14/2016 FINDINGS: Airspace opacities in both upper lobes and periphery of the left lower lobe, without significant change from prior exam. No new focal abnormality. Stable heart size and mediastinal contours. No pulmonary edema or evident pleural effusion. IMPRESSION: Unchanged bilateral airspace opacities from recent exams. No new abnormality. Electronically Signed   By: Jeb Levering M.D.   On: 09/17/2016 21:27     STUDIES:  Venous Duplex 11/16: negative for DVT  Bronch 11/18: RML, no DAH, some erythema blood rml  CXR PA/LAT 12/4: . Patchy bilateral opacities. These may have been present to some degree on imaging from her chest x-ray on 11/30. No obvious consolidation or air bronchograms. No pleural effusion. Marked kyphosis. Heart normal in size and mediastinum normal in contour. CT Chest 12/6 > consolidation in upper lobes and left lower lobe. Increase interval and mediastinal lymph nodes, borderline enlarged, maybe reactive   MICROBIOLOGY: Culture at OSH negative MRSA PCR 11/16:  Negative Urine Ctx 11/17:  Negative BAL RML 11/18:  Normal Flora Urine Ctx 12/5 No growth Urine Ctx  11/22:  10,000 colonies yeast Urine Streptococcus 12/4>> neg  Urine Legionella 12/4/>>  ANTIBIOTICS: Rocephin 11/21 - 11/24 Bactrim BID 11/30 >> Cefepime 11/14 - 11/16; 12/ 4 >> Vancomycin 11/14 - 11/16; 12/4 >> Diflucan 09/15/2016>>  SEROLOGIES: 08/2016 Alpha-1 antitrypsin: 120 ANA:  Negative Rheumatoid factor: 10.6 C3: 103 C4: 8 ESR 12/4 >> 70 CRP 12/4>> 16.2 DS DNA 12/4>> Histone AB 12/4/>> Smith AB 12/4>>  LINES/TUBES: PIV  Discussion: Pt. Had an episode of desaturation into the 50"s ( per nursing it was sustained) 12/5 early am with rolling to clean patient up..Rapid Response RN was notified. PA and MD assessed patient . CXR was done which indicated Multi focal opacities bilaterally without significant interval change. She was placed back on BiPAP and then this morning on 5 L [place]. Baseline home oxygen is  2 L.This morning she is lethargic and on 5 L saturations are 98%. I have asked the nursing staff to wean oxygen for saturation of 88-92%. I told them 98% was too high for this patient with COPD. Additionally her husband who is at bedside states that this patient is followed by Dr. Harrietta Guardian, Immunologist [facility] for autosomal recessive chronic granulomatous disease. She was taking bactrim BID chronically. He states that the patient had  been taking a medication called Itraconazole until last year when it got too costly for them to continue. Since she has been off this medication, she has had multiple pneumonias. Per the  husband, starting Jan. 1st, the medication will only cost them $99 per month and they hope to start it back after the new year. As of 09/17/2016 her condition has not improved and she's had intermittent BiPAP use on the floor as well as persistent hypoxemia.   ASSESSMENT / PLAN:   50 y.o. female with history of COPD and chronic hypoxic respiratory failure on 2 L/m at home chronically previously followed by [facility] with Dr. Alcide Clever. Patient also utilizes noninvasive positive pressure ventilation at home. Previous history of chronic granulomatous disease. She now has acute on chronic hypoxic respiratory failure with progressive consolidative patches in her lungs bilaterally in addition to a background of a fine groundglass of  uncertain etiology. The differential diagnosis here is broad and may be an underlying malignancy such as adenocarcinoma versus an invasive fungal process such as aspergillosis versus somehow related to her baseline chronic granulomatous disease. Again, the differential diagnosis includes items apart from this. However, she does not appear to have pneumonia and that she's not febrile nor she producing mucus.   Assessment:  Pulmonary: Acute on chronic respiratory failure with hypoxemia Unexplained pulmonary infiltrates Progressive consolidation bilaterally in pulmonary parenchyma COPD Plan: Continue bronchodilators BIPAP As needed   Broad spectrum ntimicrobial coverage to have anti-mold therapy (for econazole) serum galactomannan's (high rate of false positive with beta lactam use), serum histo antigen Follow-up autoimmune panel sent earlier this week Plan for bronchoscopy by Monday, December 11 or sooner if intubated  Cardiovascular: Hypertension  P:  Monitor blood pressure Telemetry  Renal: Hypokalemia-resolved   Plan: Repeat basic metabolic panel Monitor BMET and UOP Replace electrolytes as needed   Gastrointestinal: No acute issues GERD  P: Heart healthy diet  Will need to be NPO for FOB on Monday   Hematology: No acute issues  P: Check cbc Monitor for bleeding  Infectious A: Possible invasive fungal pulmonary process P: Plan bronch 12/11 Check serum galactomannan Cont on oriconazole  Neuro: A: Bipolar P: Monitor in ICU  Tammy Parrett NP-C  [facility]  [phone]   09/18/2016

## 2016-09-18 NOTE — Discharge Summary (Signed)
NAMRosalyn Charters:  Frieling, Alsha                ACCOUNT NO.:  [id number]  MEDICAL RECORD NO.:  [id number]  LOCATION:  [id number]                        FACILITY:  [facility]  PHYSICIAN:  Maryla MorrowAnkit Patel, MD        DATE OF BIRTH:  02-Apr-1966  DATE OF ADMISSION:  09/09/2016 DATE OF DISCHARGE:  09/17/2016                              DISCHARGE SUMMARY   DISCHARGE DIAGNOSES: 1. 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 2. SCDs for deep venous thrombosis prophylaxis. 3. Bipolar disorder. 4. Chronic granulomatous disease. 5. Diabetes mellitus. 6. Hypertension. 7. Acute-on-chronic diastolic congestive heart failure. 8. Hyperlipidemia. 9. Acute blood loss anemia. 10.Decreased nutritional storage. 11.Healthcare-associated pneumonia.  HISTORY OF PRESENT ILLNESS:  This is a 50 year old right-handed female, history of COPD, on home oxygen as well as BiPAP.  Maintained on Bactrim prophylaxis with chronic granulomatous disease.  Lives with spouse. Used a rolling walker prior to admission sedentary.  [facility], August 22, 2016, for acute-on-chronic respiratory failure, low-grade fever, hypoxia in the 70s, required intubation. Findings of hyponatremia, 128.  Started on Solu-Medrol and nebulizer treatments.  [facility] August 26, 2016, for ongoing medical care.  Chest x-ray is negative for acute abnormality. CT angiogram of the chest negative for pulmonary emboli, concerning for pneumonia, placed on broad-spectrum antibiotics.  She did require intubation, completing a prednisone Dosepak.  Physical and occupational therapy ongoing.  The patient was admitted for a comprehensive rehab program.  PAST MEDICAL HISTORY:  See discharge diagnoses.  SOCIAL HISTORY:  Lives with husband, used assistive device prior to admission.  FUNCTIONAL STATUS:  Upon  admission to Rehab Services was +2 physical assist, stand pivot transfers, min-to-mod assist, activities of daily living.  PHYSICAL EXAMINATION:  VITAL SIGNS:  Blood pressure 104/53, pulse 69, temperature 98, respirations 17. GENERAL:  This was an alert female, appearing older than stated age. LUNGS:  Decreased breath sounds at the bases.  Scattered wheezes in the right anterior lung fields. ABDOMEN:  Soft, nontender.  Good bowel sounds. CARDIAC:  Regular rate and rhythm.  REHABILITATION HOSPITAL COURSE:  The patient was admitted to Inpatient Rehab Services.  Therapies initiated on a 3-hour daily basis, consisting of physical and occupational therapy as well as nursing care.  Following issues were addressed during the patient's rehab stay.  Pertaining to Ms. 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  She was followed closely by Pulmonary Services.  Suspect HCAP, currently maintained on Maxipime as well as vancomycin, oxygen saturations in the 70s, improved with nebulizer treatments.  She was attending therapies, needing frequent rest breaks, minimal overall progress.  Blood pressure is well controlled.  She was monitored closely for any fluid overload.  She remained on Lasix therapy 40 mg daily.  Prophylactic Bactrim therapy for history of chronic granulomatous disease.  On the morning of September 17, 2016, decreased oxygen saturations.  The patient with complaints of increased shortness of breath.  Most recent chest x-ray with CT of the chest showing interval development of peripheral areas of confluent airspace consolidation in  the upper lobe and left lower lobe.  Due to these ongoing findings, it was felt by Pulmonary Services the patient should be transferred to Acute Care Services and plan for bronchoscopy. She was discharged to Acute Care Services in guarded condition.  All issues discussed with the patient and  family, any medication changes made were per Pulmonary Services.     Mariam Dollaraniel Wai Minotti, P.A.   ______________________________ Maryla MorrowAnkit Patel, MD    DA/MEDQ  D:  09/17/2016  T:  09/18/2016  Job:  [id number]

## 2016-09-19 ENCOUNTER — Inpatient Hospital Stay (HOSPITAL_COMMUNITY): Payer: Medicare HMO

## 2016-09-19 DIAGNOSIS — J9601 Acute respiratory failure with hypoxia: Secondary | ICD-10-CM

## 2016-09-19 DIAGNOSIS — J9602 Acute respiratory failure with hypercapnia: Secondary | ICD-10-CM

## 2016-09-19 DIAGNOSIS — J441 Chronic obstructive pulmonary disease with (acute) exacerbation: Secondary | ICD-10-CM

## 2016-09-19 DIAGNOSIS — B49 Unspecified mycosis: Principal | ICD-10-CM

## 2016-09-19 DIAGNOSIS — J17 Pneumonia in diseases classified elsewhere: Secondary | ICD-10-CM

## 2016-09-19 LAB — BLOOD GAS, ARTERIAL
ACID-BASE EXCESS: 4.6 mmol/L — AB (ref 0.0–2.0)
BICARBONATE: 31.4 mmol/L — AB (ref 20.0–28.0)
DELIVERY SYSTEMS: POSITIVE
DRAWN BY: 441371
Expiratory PAP: 6
Inspiratory PAP: 14
Mode: POSITIVE
O2 Content: 8 L/min
O2 Saturation: 89 %
PATIENT TEMPERATURE: 98.6
PH ART: 7.246 — AB (ref 7.350–7.450)
PO2 ART: 65.8 mmHg — AB (ref 83.0–108.0)
pCO2 arterial: 74.9 mmHg (ref 32.0–48.0)

## 2016-09-19 LAB — GLUCOSE, CAPILLARY
GLUCOSE-CAPILLARY: 107 mg/dL — AB (ref 65–99)
GLUCOSE-CAPILLARY: 83 mg/dL (ref 65–99)
GLUCOSE-CAPILLARY: 88 mg/dL (ref 65–99)

## 2016-09-19 LAB — RENAL FUNCTION PANEL
ANION GAP: 5 (ref 5–15)
Albumin: 2.2 g/dL — ABNORMAL LOW (ref 3.5–5.0)
BUN: <5 mg/dL — ABNORMAL LOW (ref 6–20)
CHLORIDE: 91 mmol/L — AB (ref 101–111)
CO2: 38 mmol/L — AB (ref 22–32)
CREATININE: 0.48 mg/dL (ref 0.44–1.00)
Calcium: 8.5 mg/dL — ABNORMAL LOW (ref 8.9–10.3)
Glucose, Bld: 136 mg/dL — ABNORMAL HIGH (ref 65–99)
POTASSIUM: 4.3 mmol/L (ref 3.5–5.1)
Phosphorus: 4.4 mg/dL (ref 2.5–4.6)
Sodium: 134 mmol/L — ABNORMAL LOW (ref 135–145)

## 2016-09-19 LAB — CBC WITH DIFFERENTIAL/PLATELET
BASOS ABS: 0 10*3/uL (ref 0.0–0.1)
Basophils Relative: 0 %
EOS PCT: 2 %
Eosinophils Absolute: 0.1 10*3/uL (ref 0.0–0.7)
HCT: 28.6 % — ABNORMAL LOW (ref 36.0–46.0)
HEMOGLOBIN: 8.6 g/dL — AB (ref 12.0–15.0)
LYMPHS ABS: 1 10*3/uL (ref 0.7–4.0)
LYMPHS PCT: 28 %
MCH: 25.8 pg — AB (ref 26.0–34.0)
MCHC: 30.1 g/dL (ref 30.0–36.0)
MCV: 85.9 fL (ref 78.0–100.0)
Monocytes Absolute: 0.5 10*3/uL (ref 0.1–1.0)
Monocytes Relative: 14 %
NEUTROS ABS: 2 10*3/uL (ref 1.7–7.7)
NEUTROS PCT: 56 %
Platelets: 173 10*3/uL (ref 150–400)
RBC: 3.33 MIL/uL — AB (ref 3.87–5.11)
RDW: 19.2 % — ABNORMAL HIGH (ref 11.5–15.5)
WBC: 3.6 10*3/uL — AB (ref 4.0–10.5)

## 2016-09-19 LAB — ASPERGILLUS ANTIGEN, BAL/SERUM: Aspergillus Ag, BAL/Serum: 0.06 Index (ref 0.00–0.49)

## 2016-09-19 LAB — MAGNESIUM: MAGNESIUM: 1.9 mg/dL (ref 1.7–2.4)

## 2016-09-19 LAB — VANCOMYCIN, TROUGH: VANCOMYCIN TR: 17 ug/mL (ref 15–20)

## 2016-09-19 MED ORDER — IPRATROPIUM-ALBUTEROL 0.5-2.5 (3) MG/3ML IN SOLN
3.0000 mL | RESPIRATORY_TRACT | Status: DC
  Administered 2016-09-19 – 2016-09-20 (×3): 3 mL via RESPIRATORY_TRACT
  Filled 2016-09-19 (×3): qty 3

## 2016-09-19 MED ORDER — INSULIN GLARGINE 100 UNIT/ML ~~LOC~~ SOLN
5.0000 [IU] | SUBCUTANEOUS | Status: DC
  Administered 2016-09-19 – 2016-09-25 (×6): 5 [IU] via SUBCUTANEOUS
  Filled 2016-09-19 (×9): qty 0.05

## 2016-09-19 MED ORDER — FUROSEMIDE 10 MG/ML IJ SOLN
40.0000 mg | Freq: Once | INTRAMUSCULAR | Status: AC
  Administered 2016-09-19: 40 mg via INTRAVENOUS
  Filled 2016-09-19: qty 4

## 2016-09-19 MED ORDER — METHYLPREDNISOLONE SODIUM SUCC 125 MG IJ SOLR
40.0000 mg | Freq: Four times a day (QID) | INTRAMUSCULAR | Status: DC
  Administered 2016-09-19 – 2016-09-24 (×19): 40 mg via INTRAVENOUS
  Filled 2016-09-19 (×19): qty 2

## 2016-09-19 NOTE — Progress Notes (Signed)
PULMONARY / CRITICAL CARE MEDICINE   Name: Cheryl Wheeler MRN: [id number] DOB: [birth year]    ADMISSION DATE:  09/17/2016  REFERRING MD:  Dr. Posey Pronto   CHIEF COMPLAINT:  Dyspnea  BRIEF:  Cheryl Wheeler is a 50 y.o. 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   On 08/22/2016, patient presented to [facility] with dyspnea, dry cough and subjective fever and hypoxia to 70's. Initial ABG was 7.4/66/45/41 at [facility]. CBC significant for mild leukocytosis. BMP remarkable for hypoosmolar hyponatremia to 128 otherwise within normal limits. UA was remarkable for pyuria but urine culture was negative. She was started on Solu-Medrol, DuoNeb, Pulmicort and guanfacine-DM for COPD exacerbation. However, she continued to have worsening dyspnea with increased oxygen requirement on BiPAP continuously. She was also started on cefepime and vancomycin on 08/24/2016.  On the day of transfer, vitals and was normal limits except for tachypnea to 25, ABG was 7.4/65/75/41/ Last BMP within normal limits. Mild leukocytosis and hyponatremia resolved.   On arrival, patient was on BiPAP with some increased work of breathing. She reported headache at the site where the the BiPAP mask rests on her forehead, denies chest pain, admits shortness of breath, denies abdominal pain, denies dysuria and admits chronic back pain. She reports swelling in the right foot and right calf for about a month prior to presentation to [facility] that has resolved.  Patient likes stated she would want  intubation or trach done.   SIGNIFICANT EVENTS: 11/12 - Admitted to [facility] where his acute on chronic COPD exacerbation and hypoosmolar hyponatremia 11/14 - Started on vancomycin and  cefepime 11/16 - Transferred to [facility] ICU for possible tracheostomy due to continuous need for BiPAP and increased oxygen requirement.  11/17 - Intubated. CT chest negative for PE.  11/18 - Bronch RML, no DAH, some erthyema blood, BAL sent, tolerated 11/24 - extubated  11/26 - TRH primary  11/30 - Transfer to inpatient rehab   SUBJECTIVE:  Wore bipap overnight.  C/o mild SOB.  Hungry.  Quickly desats off bipap, now back on.  Lethargic (baseline per RN, RT).    VITAL SIGNS: BP 117/61 (BP Location: Left Arm)   Pulse 85   Temp 98.8 F (37.1 C) (Oral)   Resp 16   Ht 4' 11.5" (1.511 m)   Wt 75.5 kg (166 lb 7.2 oz)   SpO2 (!) 87%   BMI 33.06 kg/m   HEMODYNAMICS:    INTAKE / OUTPUT: I/O last 3 completed shifts: In: 2390 [P.O.:480; I.V.:235; IV Piggyback:1675] Out: -   PHYSICAL EXAMINATION: General:  Chronically ill appearing female, NAD OOB in chair on bipap  HEENT l oropharynx is clear, normocephalic atraumatic, bipap Pulmonary: Crackles bases bilaterally, diminished Cardiovascular: regular rate and rhythm, no murmurs gallops or rubs GI: Bowel sounds positive, nontender nondistended Musculoskeletal: Normal bulk and tone, 1+ BLE edema  Dermatologic: Slightly cyanotic fingers bilaterally Neurologic: Awake, alert, oriented 4  LABS:  BMET  Recent Labs Lab 09/18/16 0135 09/18/16 0904 09/19/16 0307  NA 137 139 134*  K 3.0* 4.4 4.3  CL 90* 98* 91*  CO2 36* 33* 38*  BUN 6 5* <5*  CREATININE 0.42* 0.40* 0.48  GLUCOSE 74 72 136*    Electrolytes  Recent Labs Lab 09/17/16 2222 09/18/16 0135 09/18/16 0904 09/19/16 0307  CALCIUM  8.7* 8.6* 8.1* 8.5*  MG 1.6*  --   --  1.9  PHOS 3.8  --   --  4.4    CBC  Recent Labs Lab 09/17/16 2222 09/18/16 0135 09/19/16 0307  WBC 4.2 4.3 3.6*  HGB 8.9* 8.8* 8.6*  HCT 29.5* 29.1* 28.6*  PLT 159 187 173    Coag's No results for input(s): APTT, INR in the last 168 hours.  Sepsis Markers  Recent Labs Lab  09/15/16 0633 09/17/16 0533 09/17/16 2222  PROCALCITON <0.10 <0.10 <0.10    ABG No results for input(s): PHART, PCO2ART, PO2ART in the last 168 hours.  Liver Enzymes  Recent Labs Lab 09/19/16 0307  ALBUMIN 2.2*    Cardiac Enzymes No results for input(s): TROPONINI, PROBNP in the last 168 hours.  Glucose  Recent Labs Lab 09/17/16 2138 09/18/16 0824 09/18/16 1235 09/18/16 1628 09/18/16 2139 09/19/16 0728  GLUCAP 80 80 120* 116* 119* 107*    Imaging No results found.   STUDIES:  Venous Duplex 11/16: negative for DVT  Bronch 11/18: RML, no DAH, some erythema blood rml  CXR PA/LAT 12/4: . Patchy bilateral opacities. These may have been present to some degree on imaging from her chest x-ray on 11/30. No obvious consolidation or air bronchograms. No pleural effusion. Marked kyphosis. Heart normal in size and mediastinum normal in contour. CT Chest 12/6 > consolidation in upper lobes and left lower lobe. Increase interval and mediastinal lymph nodes, borderline enlarged, maybe reactive   MICROBIOLOGY: Culture at OSH negative MRSA PCR 11/16:  Negative Urine Ctx 11/17:  Negative BAL RML 11/18:  Normal Flora Urine Ctx 12/5 No growth Urine Ctx  11/22:  10,000 colonies yeast Urine Streptococcus 12/4>> neg  Urine Legionella 12/4/>>  ANTIBIOTICS: Rocephin 11/21 - 11/24 Bactrim BID 11/30 >> Cefepime 11/14 - 11/16; 12/ 4 >> Vancomycin 11/14 - 11/16; 12/4 >> Diflucan 09/15/2016>>  SEROLOGIES: 08/2016 Alpha-1 antitrypsin: 120 ANA: Negative Rheumatoid factor: 10.6 C3: 103 C4: 8 ESR 12/4 >> 70 CRP 12/4>> 16.2 DS DNA 12/4>> Histone AB 12/4/>> Smith AB 12/4>>  LINES/TUBES: PIV   ASSESSMENT / PLAN:   50 y.o. female with history of COPD and chronic hypoxic respiratory failure on 2 L/m and qhs bipap at home previously followed by [facility] with Dr. Alcide Clever.  Previous history of chronic granulomatous disease. She now has acute on chronic hypoxic respiratory  failure with progressive consolidative patches in her lungs bilaterally in addition to a background of a fine groundglass of uncertain etiology. The differential diagnosis here is broad and may be an underlying malignancy such as adenocarcinoma versus an invasive fungal process such as aspergillosis versus somehow related to her baseline chronic granulomatous disease. Again, the differential diagnosis includes items apart from this. However, she does not appear to have pneumonia in that she's not febrile nor she producing mucus.   Assessment:  Pulmonary: Acute on chronic respiratory failure with hypoxemia - worsening hypoxia 12/10 Unexplained pulmonary infiltrates Progressive consolidation bilaterally in pulmonary parenchyma COPD Plan: Continue bronchodilators BIPAP As needed  - change to hospital bipap  Pulmonary hygiene  Lasix 2m IV x 1 now  CXR now  Will check ABG  Broad spectrum ntimicrobial coverage to have anti-mold therapy (for econazole) serum galactomannan's (high rate of false positive with beta lactam use), serum histo antigen Follow-up autoimmune panel results  Plan for bronchoscopy by Monday, December 11 or sooner if intubated -- if continues to have increased bipap needs may need intubation for FOB  Cardiovascular: Hypertension  P:  Lasix as above  Labetalol   Renal: Hypokalemia-resolved   Plan: Repeat basic metabolic panel Monitor BMET and UOP Replace electrolytes as needed   Gastrointestinal: No acute issues GERD  P: Heart healthy diet  NPO now with increasing bipap needs   Hematology: No acute issues  P: Check cbc Monitor for bleeding  Infectious A: Possible invasive fungal pulmonary process P: Plan bronch 12/11 Check serum galactomannan Cont on oriconazole  Neuro: A: Bipolar AMS - multifactorial  P: Monitor in ICU Hold qhs risperdal  Check ABG    Nickolas Madrid, NP 09/19/2016  8:57 AM Pager: [phone] or [phone]

## 2016-09-19 NOTE — Progress Notes (Signed)
IPAP adjusted to 15, EPAP adjusted to 5 due to low VT and, MVe.

## 2016-09-19 NOTE — Progress Notes (Signed)
Pharmacy Antibiotic Note  [age] YOF with chronic granulomatous disease and maintained on Bactrim for prophylaxis followed by immunologist as outpatient. 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 Vancomycin trough drawn today is therapeutic at 17. Renal function stable. Planning bronch tomorrow.  Vancomycin goal trough 15-20  Plan: 1) Continue vancomycin 1g IV q8 2) Continue cefepime 2g IV q12 3) Continue voriconazole 470 mg (6mg /kg) q12 x 2 then 310 mg (4mg /kg) q12   Height: 4' 11.5" (151.1 cm) Weight: 166 lb 7.2 oz (75.5 kg) IBW/kg (Calculated) : 44.35  Temp (24hrs), Avg:99.1 F (37.3 C), Min:98 F (36.7 C), Max:100.2 F (37.9 C)   Recent Labs Lab 09/13/16 1524 09/16/16 0516 09/16/16 1430 09/17/16 2222 09/18/16 0135 09/18/16 0904 09/19/16 0307 09/19/16 1201  WBC 5.5  --   --  4.2 4.3  --  3.6*  --   CREATININE  --  0.42*  --  0.43* 0.42* 0.40* 0.48  --   VANCOTROUGH  --   --  8*  --   --   --   --  17    Estimated Creatinine Clearance: 74.6 mL/min (by C-G formula based on SCr of 0.48 mg/dL).    Allergies  Allergen Reactions  . Fentanyl Shortness Of Breath  . Meperidine Other (See Comments)    chest pain-"feels like squeezing heart"  . Azithromycin Other (See Comments)    VRE  . Codeine Nausea Only  . Levofloxacin Other (See Comments)    Unknown  . Other Other (See Comments)    BLACK STICHES=BODY REJECTS THEM  . Penicillins Rash  . Sulfa Antibiotics Rash    Tolerates Bactrim    Antimicrobials this admission:  Vancomycin 12/4 >> Zosyn 12/4 >> Fluconazole 12/6 >> 12/8 Voriconazole 12/8 >> On chronic Bactrim    Dose adjustments this admission:  12/07: VT 8 on 1g IV q12 > increase to 1g IV q8  Microbiology results:  12/5 UCx: negative final 12/5 Sputum: moderate fungus 12/8 aspergillus Ag: wnl   Thank you for allowing pharmacy to be a part of this patient's care.  Louie CasaJennifer  Kenda Kloehn, PharmD, BCPS 09/19/2016 1:38 PM

## 2016-09-19 NOTE — Progress Notes (Signed)
ABG results called to Dirk DressKaty Whiteheart, NP. Will leave pt on bipap for now, will cont to monitor.

## 2016-09-19 NOTE — Progress Notes (Signed)
Patient sats dropped to as low as 78% on 3LNC. Patient sitting in chair drowsy, responds to voice but drifts to sleep when not engaged.Patient placed on bipap with 5L.Sats increased to low 80s. RT called to bedside. CCM NP at bedside. CXR and ABG obtained.

## 2016-09-20 ENCOUNTER — Encounter (HOSPITAL_COMMUNITY): Payer: Medicare HMO

## 2016-09-20 ENCOUNTER — Encounter (HOSPITAL_COMMUNITY): Payer: Self-pay | Admitting: *Deleted

## 2016-09-20 LAB — GLUCOSE, CAPILLARY
GLUCOSE-CAPILLARY: 171 mg/dL — AB (ref 65–99)
Glucose-Capillary: 112 mg/dL — ABNORMAL HIGH (ref 65–99)
Glucose-Capillary: 141 mg/dL — ABNORMAL HIGH (ref 65–99)
Glucose-Capillary: 145 mg/dL — ABNORMAL HIGH (ref 65–99)
Glucose-Capillary: 158 mg/dL — ABNORMAL HIGH (ref 65–99)

## 2016-09-20 LAB — CBC WITH DIFFERENTIAL/PLATELET
BASOS ABS: 0 10*3/uL (ref 0.0–0.1)
BASOS PCT: 0 %
Eosinophils Absolute: 0 10*3/uL (ref 0.0–0.7)
Eosinophils Relative: 0 %
HCT: 28.8 % — ABNORMAL LOW (ref 36.0–46.0)
HEMOGLOBIN: 8.6 g/dL — AB (ref 12.0–15.0)
LYMPHS PCT: 18 %
Lymphs Abs: 0.6 10*3/uL — ABNORMAL LOW (ref 0.7–4.0)
MCH: 25.8 pg — ABNORMAL LOW (ref 26.0–34.0)
MCHC: 29.9 g/dL — AB (ref 30.0–36.0)
MCV: 86.5 fL (ref 78.0–100.0)
MONO ABS: 0.1 10*3/uL (ref 0.1–1.0)
MONOS PCT: 4 %
NEUTROS PCT: 78 %
Neutro Abs: 2.5 10*3/uL (ref 1.7–7.7)
Platelets: 186 10*3/uL (ref 150–400)
RBC: 3.33 MIL/uL — ABNORMAL LOW (ref 3.87–5.11)
RDW: 18.8 % — AB (ref 11.5–15.5)
WBC: 3.2 10*3/uL — ABNORMAL LOW (ref 4.0–10.5)

## 2016-09-20 LAB — RENAL FUNCTION PANEL
ALBUMIN: 2.2 g/dL — AB (ref 3.5–5.0)
ANION GAP: 12 (ref 5–15)
BUN: <5 mg/dL — ABNORMAL LOW (ref 6–20)
CALCIUM: 8.7 mg/dL — AB (ref 8.9–10.3)
CO2: 31 mmol/L (ref 22–32)
Chloride: 94 mmol/L — ABNORMAL LOW (ref 101–111)
Creatinine, Ser: 0.32 mg/dL — ABNORMAL LOW (ref 0.44–1.00)
GFR calc non Af Amer: >60 mL/min (ref >60)
Glucose, Bld: 132 mg/dL — ABNORMAL HIGH (ref 65–99)
PHOSPHORUS: 3.7 mg/dL (ref 2.5–4.6)
POTASSIUM: 4.8 mmol/L (ref 3.5–5.1)
SODIUM: 137 mmol/L (ref 135–145)

## 2016-09-20 MED ORDER — VORICONAZOLE 200 MG IV SOLR
250.0000 mg | Freq: Two times a day (BID) | INTRAVENOUS | Status: DC
  Administered 2016-09-20 – 2016-09-27 (×14): 250 mg via INTRAVENOUS
  Filled 2016-09-20 (×21): qty 250

## 2016-09-20 MED ORDER — IPRATROPIUM-ALBUTEROL 0.5-2.5 (3) MG/3ML IN SOLN
3.0000 mL | Freq: Four times a day (QID) | RESPIRATORY_TRACT | Status: DC
  Administered 2016-09-20 – 2016-09-24 (×18): 3 mL via RESPIRATORY_TRACT
  Filled 2016-09-20 (×18): qty 3

## 2016-09-20 MED ORDER — FUROSEMIDE 10 MG/ML IJ SOLN
40.0000 mg | Freq: Once | INTRAMUSCULAR | Status: AC
  Administered 2016-09-20: 40 mg via INTRAVENOUS
  Filled 2016-09-20: qty 4

## 2016-09-20 MED ORDER — GERHARDT'S BUTT CREAM
TOPICAL_CREAM | Freq: Three times a day (TID) | CUTANEOUS | Status: DC
  Administered 2016-09-20: 22:00:00 via TOPICAL
  Administered 2016-09-20: 1 via TOPICAL
  Administered 2016-09-20 – 2016-09-23 (×9): via TOPICAL
  Administered 2016-09-24: 1 via TOPICAL
  Administered 2016-09-24 – 2016-09-25 (×3): via TOPICAL
  Administered 2016-09-25: 1 via TOPICAL
  Administered 2016-09-25 – 2016-09-28 (×9): via TOPICAL
  Filled 2016-09-20: qty 1

## 2016-09-20 NOTE — Progress Notes (Signed)
RN called d/t pt desat to 82% on HFNC.  No distress noted but sat consistently 82-84%.  Placed pt back on bipap- RN in room.  Pt appears to be tol well so far.  Sat improved to 93-94% on bipap.

## 2016-09-20 NOTE — Progress Notes (Signed)
Pt is awake and following commands.  RN requesting to do mouth care.  Trial of HFNC at 12 lpm started.  Sat 91-92%.  Pt denies SOB, no distress noted.  VSS.  RN in room and aware.

## 2016-09-20 NOTE — Progress Notes (Signed)
RN called d/t pt w/ sat 82%.  Pt placed back on bipap.  Pt appears to be tol well, pt denies SOB currently, no distress noted.  Sat now 95% on bipap

## 2016-09-20 NOTE — Progress Notes (Signed)
Pharmacy Antibiotic Note  [age] YOF with chronic granulomatous disease and maintained on Bactrim for prophylaxis followed by immunologist as outpatient. She continues on voriconazole for pneumonia with possible invasive fungal pulmonary process. Will adjust voriconazole today utilizing the patient's adjusted body weight as recommended for dosing.  Plan 1. Adjust Voriconazole to 250 mg (~4 mg/kg Adjusted BW) every 12 hours 2. Will follow-up fungal culture results from 12/5   Height: 4' 11.5" (151.1 cm) Weight: 183 lb 10.3 oz (83.3 kg) IBW/kg (Calculated) : 44.35  Temp (24hrs), Avg:99.1 F (37.3 C), Min:97.6 F (36.4 C), Max:100.9 F (38.3 C)   Recent Labs Lab 09/13/16 1524  09/16/16 1430 09/17/16 2222 09/18/16 0135 09/18/16 0904 09/19/16 0307 09/19/16 1201 09/20/16 0316  WBC 5.5  --   --  4.2 4.3  --  3.6*  --  3.2*  CREATININE  --   < >  --  0.43* 0.42* 0.40* 0.48  --  0.32*  VANCOTROUGH  --   --  8*  --   --   --   --  17  --   < > = values in this interval not displayed.  Estimated Creatinine Clearance: 78.8 mL/min (by C-G formula based on SCr of 0.32 mg/dL (L)).    Allergies  Allergen Reactions  . Fentanyl Shortness Of Breath  . Meperidine Other (See Comments)    chest pain-"feels like squeezing heart"  . Azithromycin Other (See Comments)    VRE  . Codeine Nausea Only  . Levofloxacin Other (See Comments)    Unknown  . Other Other (See Comments)    BLACK STICHES=BODY REJECTS THEM  . Penicillins Rash  . Sulfa Antibiotics Rash    Tolerates Bactrim    Antimicrobials this admission:  Vancomycin 12/4 >> 12/11 Zosyn 12/4 >> 12/11 Fluconazole 12/6 >> 12/8 Voriconazole 12/8 >> On chronic Bactrim    Dose adjustments this admission:  12/07: VT 8 on 1g IV q12 > increase to 1g IV q8  Microbiology results:  12/5 UCx: negative final 12/5 Sputum: moderate fungus 12/8 aspergillus Ag: wnl  Thank you for allowing pharmacy to be a part of this patient's  care.  Georgina PillionElizabeth Codylee Patil, PharmD, BCPS Clinical Pharmacist Pager: [phone] Clinical phone for 09/20/2016 from 7a-3:30p: [phone] If after 3:30p, please call main pharmacy at: [phone] 09/20/2016 2:00 PM

## 2016-09-20 NOTE — Progress Notes (Signed)
PULMONARY / CRITICAL CARE MEDICINE   Name: Cheryl Wheeler MRN: [id number] DOB: August 05, 1966    ADMISSION DATE:  09/17/2016  REFERRING MD:  Dr. Posey Pronto   CHIEF COMPLAINT:  Dyspnea  BRIEF:  50 yo female presented to [facility] 08/22/16 with dyspnea, cough, fever, hypoxia, hyponatremia.  She remained on Bipap and transferred to [facility] for further therapy.  PMHx of COPD on home oxygen and Bipap, Chronic granulomatous disease, HTN, Bipolar, anxiety, GERD  SIGNIFICANT EVENTS: 11/12 - Admitted to [facility] where his acute on chronic COPD exacerbation and hypoosmolar hyponatremia 11/14 - Started on vancomycin and cefepime 11/16 - Transferred to [facility] ICU for possible tracheostomy due to continuous need for BiPAP and increased oxygen requirement.  11/17 - Intubated. CT chest negative for PE.  11/18 - Bronch RML, no DAH, some erthyema blood, BAL sent, tolerated 11/24 - extubated  11/26 - TRH primary  11/30 - Transfer to inpatient rehab   SUBJECTIVE:  Remains on Bipap.  VITAL SIGNS: BP (!) 144/67   Pulse 81   Temp 97.8 F (36.6 C) (Axillary)   Resp 14   Ht 4' 11.5" (1.511 m)   Wt 183 lb 10.3 oz (83.3 kg)   SpO2 97%   BMI 36.47 kg/m   PHYSICAL EXAMINATION: General: ill appearing Neuro: follows commands HEENT: Bipap on Cardiac: regular Chest: b/l crackles Abd: soft, non tender Ext: 1+ edema Skin: no rashes  LABS:  BMET  Recent Labs Lab 09/18/16 0904 09/19/16 0307 09/20/16 0316  NA 139 134* 137  K 4.4 4.3 4.8  CL 98* 91* 94*  CO2 33* 38* 31  BUN 5* <5* <5*  CREATININE 0.40* 0.48 0.32*  GLUCOSE 72 136* 132*    Electrolytes  Recent Labs Lab 09/17/16 2222  09/18/16 0904 09/19/16 0307 09/20/16 0316  CALCIUM 8.7*  < > 8.1* 8.5* 8.7*  MG 1.6*  --   --  1.9  --   PHOS 3.8  --   --  4.4 3.7  < > = values in this interval not displayed.  CBC  Recent Labs Lab 09/18/16 0135 09/19/16 0307 09/20/16 0316  WBC 4.3 3.6* 3.2*  HGB 8.8* 8.6* 8.6*  HCT  29.1* 28.6* 28.8*  PLT 187 173 186    Coag's No results for input(s): APTT, INR in the last 168 hours.  Sepsis Markers  Recent Labs Lab 09/15/16 0633 09/17/16 0533 09/17/16 2222  PROCALCITON <0.10 <0.10 <0.10    ABG  Recent Labs Lab 09/19/16 0900  PHART 7.246*  PCO2ART 74.9*  PO2ART 65.8*    Liver Enzymes  Recent Labs Lab 09/19/16 0307 09/20/16 0316  ALBUMIN 2.2* 2.2*    Cardiac Enzymes No results for input(s): TROPONINI, PROBNP in the last 168 hours.  Glucose  Recent Labs Lab 09/18/16 2139 09/19/16 0728 09/19/16 1214 09/19/16 1707 09/20/16 0012 09/20/16 0742  GLUCAP 119* 107* 83 88 112* 145*    Imaging No results found.   STUDIES:  Venous Duplex 11/16 >> negative for DVT  Bronch 11/18 >> RML, no DAH, some erythema blood rml  CT Chest 12/6 > consolidation in upper lobes and left lower lobe. Increase interval and mediastinal lymph nodes, borderline enlarged, maybe reactive   ANTIBIOTICS: Rocephin 11/21 - 11/24 Bactrim BID 11/30 >> Cefepime 11/14 - 11/16; 12/ 4 >> Vancomycin 11/14 - 11/16; 12/4 >> Diflucan 09/15/2016>>  SEROLOGIES: 08/2016 Alpha-1 antitrypsin: 120 ANA: Negative Rheumatoid factor: 10.6 C3: 103 C4: 8 ESR 12/4 >> 70 CRP 12/4>> 16.2 DS DNA 12/4>> Histone  AB 12/4/>> Tamala Julian AB 12/4>>  Assessment:  Acute hypoxic respiratory failure with b/l pulmonary infiltrates with Respiratory syncytial virus positive 08/28/16. Hx of COPD, chronic granulomatous disease. - serology negative - moderate fungus in sputum 12/05, speciation still pending >> continue voriconazole - has been on several days of Abx, cultures negative >> will d/c Abx  - continue oxygen and Bipap - f/u CXR - scheduled duoneb - continue solumedrol - negative fluid balance as tolerated  Hx of HTN, HLD. - labetalol, lipitor  Hx of bipolar, anxiety. - risperdal, remeron, valproic acid  Steroid induced hyperglycemia. - SSI  Anemia of critical illness. -  f/u CBC  CC time 32 minutes.  Chesley Mires, MD [facility] Pulmonary/Critical Care 09/20/2016, 9:52 AM Pager:  [phone] After 3pm call: [phone]

## 2016-09-21 ENCOUNTER — Encounter (HOSPITAL_COMMUNITY): Payer: Self-pay | Admitting: *Deleted

## 2016-09-21 ENCOUNTER — Inpatient Hospital Stay (HOSPITAL_COMMUNITY): Payer: Medicare HMO

## 2016-09-21 LAB — CBC WITH DIFFERENTIAL/PLATELET
Basophils Absolute: 0 10*3/uL (ref 0.0–0.1)
Basophils Relative: 0 %
EOS PCT: 0 %
Eosinophils Absolute: 0 10*3/uL (ref 0.0–0.7)
HEMATOCRIT: 30.3 % — AB (ref 36.0–46.0)
Hemoglobin: 8.8 g/dL — ABNORMAL LOW (ref 12.0–15.0)
LYMPHS ABS: 0.6 10*3/uL — AB (ref 0.7–4.0)
LYMPHS PCT: 14 %
MCH: 25.4 pg — AB (ref 26.0–34.0)
MCHC: 29 g/dL — AB (ref 30.0–36.0)
MCV: 87.6 fL (ref 78.0–100.0)
MONO ABS: 0.2 10*3/uL (ref 0.1–1.0)
MONOS PCT: 4 %
Neutro Abs: 3.4 10*3/uL (ref 1.7–7.7)
Neutrophils Relative %: 82 %
PLATELETS: 201 10*3/uL (ref 150–400)
RBC: 3.46 MIL/uL — ABNORMAL LOW (ref 3.87–5.11)
RDW: 18.8 % — AB (ref 11.5–15.5)
WBC: 4.2 10*3/uL (ref 4.0–10.5)

## 2016-09-21 LAB — RENAL FUNCTION PANEL
Albumin: 2.3 g/dL — ABNORMAL LOW (ref 3.5–5.0)
Anion gap: 10 (ref 5–15)
BUN: 5 mg/dL — AB (ref 6–20)
CHLORIDE: 91 mmol/L — AB (ref 101–111)
CO2: 39 mmol/L — AB (ref 22–32)
CREATININE: 0.37 mg/dL — AB (ref 0.44–1.00)
Calcium: 9.1 mg/dL (ref 8.9–10.3)
GFR calc Af Amer: >60 mL/min (ref >60)
Glucose, Bld: 144 mg/dL — ABNORMAL HIGH (ref 65–99)
POTASSIUM: 4.2 mmol/L (ref 3.5–5.1)
Phosphorus: 4.3 mg/dL (ref 2.5–4.6)
Sodium: 140 mmol/L (ref 135–145)

## 2016-09-21 LAB — GLUCOSE, CAPILLARY
GLUCOSE-CAPILLARY: 140 mg/dL — AB (ref 65–99)
GLUCOSE-CAPILLARY: 346 mg/dL — AB (ref 65–99)
Glucose-Capillary: 182 mg/dL — ABNORMAL HIGH (ref 65–99)
Glucose-Capillary: 126 mg/dL — ABNORMAL HIGH (ref 65–99)
Glucose-Capillary: 271 mg/dL — ABNORMAL HIGH (ref 65–99)

## 2016-09-21 MED ORDER — INSULIN ASPART 100 UNIT/ML ~~LOC~~ SOLN: 0.0000 [IU] | SUBCUTANEOUS | Status: DC

## 2016-09-21 MED ORDER — FUROSEMIDE 10 MG/ML IJ SOLN
60.0000 mg | Freq: Once | INTRAMUSCULAR | Status: AC
  Administered 2016-09-21: 60 mg via INTRAVENOUS
  Filled 2016-09-21: qty 6

## 2016-09-21 MED ORDER — INSULIN ASPART 100 UNIT/ML ~~LOC~~ SOLN
0.0000 [IU] | SUBCUTANEOUS | Status: DC
  Administered 2016-09-21: 1 [IU] via SUBCUTANEOUS
  Administered 2016-09-22: 5 [IU] via SUBCUTANEOUS
  Administered 2016-09-22: 9 [IU] via SUBCUTANEOUS
  Administered 2016-09-22: 5 [IU] via SUBCUTANEOUS
  Administered 2016-09-22: 2 [IU] via SUBCUTANEOUS
  Administered 2016-09-22: 1 [IU] via SUBCUTANEOUS
  Administered 2016-09-22: 4 [IU] via SUBCUTANEOUS
  Administered 2016-09-23: 5 [IU] via SUBCUTANEOUS
  Administered 2016-09-23 – 2016-09-24 (×6): 2 [IU] via SUBCUTANEOUS
  Administered 2016-09-24: 5 [IU] via SUBCUTANEOUS

## 2016-09-21 MED ORDER — POTASSIUM CHLORIDE CRYS ER 20 MEQ PO TBCR
20.0000 meq | EXTENDED_RELEASE_TABLET | Freq: Once | ORAL | Status: AC
  Administered 2016-09-21: 20 meq via ORAL
  Filled 2016-09-21: qty 1

## 2016-09-21 MED ORDER — INSULIN ASPART 100 UNIT/ML ~~LOC~~ SOLN
0.0000 [IU] | SUBCUTANEOUS | Status: DC
  Administered 2016-09-21: 7 [IU] via SUBCUTANEOUS

## 2016-09-21 NOTE — Care Management Note (Signed)
Case Management Note  Patient Details  Name: Cheryl Wheeler MRN: [id number] Date of Birth: [birth year]  Subjective/Objective:    Pt lives with spouse, was transferred from Inpatient Rehab and plans to return for continued therapy when medically stable.                          Expected Discharge Plan:  IP Rehab Facility  Discharge planning Services  CM Consult  Status of Service:  In process, will continue to follow  Magdalene RiverMayo, [name] T, RN 09/21/2016, 10:59 AM

## 2016-09-21 NOTE — Care Management Important Message (Signed)
Important Message  Patient Details  Name: Cheryl NissenJonce Ann Weinberg MRN: [id number] Date of Birth: 1966-05-02   Medicare Important Message Given:  Yes    Jerek Meulemans [name] 09/21/2016, 10:06 AM

## 2016-09-21 NOTE — Progress Notes (Signed)
Pt. was on IP Rehab 09/09/16-09/17/16 at which time she had to be transferred back to acute due to oxygen desaturation into the 50s and acute on chronic respiratory failure with hypoxemia.   Has been on and off bipap, currently off at this time.  Will follow along for medical progression and timing of medical readiness for a possible return to CIR pending insurance reauthorization and bed availability.    Weldon PickingSusan Breanna Shorkey PT Inpatient Rehab Admissions Coordinator Cell [phone] Office [phone]

## 2016-09-21 NOTE — Progress Notes (Addendum)
eLink Physician-Brief Progress Note Patient Name: Cheryl NissenJonce Ann Wheeler DOB: 05/11/1966 MRN: [id number]   Date of Service  09/21/2016  HPI/Events of Note  Blood glucose = 346.  eICU Interventions  Will order Q 4 hour Novolog SSI.     Intervention Category Major Interventions: Hyperglycemia - active titration of insulin therapy  Lenell AntuSommer,Larissa Pegg Eugene 09/21/2016, 4:39 PM

## 2016-09-21 NOTE — Progress Notes (Signed)
PULMONARY / CRITICAL CARE MEDICINE   Name: Cheryl Wheeler MRN: [id number] DOB: 05-20-1966    ADMISSION DATE:  09/17/2016  REFERRING MD:  Dr. Posey Pronto   CHIEF COMPLAINT:  Dyspnea  BRIEF:  50 yo female presented to [facility] 08/22/16 with dyspnea, cough, fever, hypoxia, hyponatremia.  She remained on Bipap and transferred to [facility] for further therapy.  PMHx of COPD on home oxygen and Bipap, Chronic granulomatous disease, HTN, Bipolar, anxiety, GERD  SIGNIFICANT EVENTS: 11/12 - Admitted to [facility] where his acute on chronic COPD exacerbation and hypoosmolar hyponatremia 11/14 - Started on vancomycin and cefepime 11/16 - Transferred to [facility] ICU for possible tracheostomy due to continuous need for BiPAP and increased oxygen requirement.  11/17 - Intubated. CT chest negative for PE.  11/18 - Bronch RML, no DAH, some erthyema blood, BAL sent, tolerated 11/24 - extubated  11/26 - TRH primary  11/30 - Transfer to inpatient rehab   SUBJECTIVE:   Off BiPAP for past 2.5 hours.  SpO2 maintaining in mid 90's on 6L O2 via [place].  Begging for food / to eat. Is +4L positive but this is not accurate since she has been incontinent and unable to get out of bed / to bed pan due to hypoxia.   VITAL SIGNS: BP 132/63   Pulse 80   Temp 97.5 F (36.4 C) (Axillary)   Resp 15   Ht 4' 11.5" (1.511 m)   Wt 178 lb 9.2 oz (81 kg)   SpO2 94%   BMI 35.46 kg/m   PHYSICAL EXAMINATION: General: adult female, visiting with husband, in NAD Neuro: follows commands HEENT: On 6L . MMM Cardiac: regular Chest: b/l crackles Abd: soft, non tender Ext: 1+ edema Skin: no rashes  LABS:  BMET  Recent Labs Lab 09/19/16 0307 09/20/16 0316 09/21/16 0307  NA 134* 137 140  K 4.3 4.8 4.2  CL 91* 94* 91*  CO2 38* 31 39*  BUN <5* <5* 5*  CREATININE 0.48 0.32* 0.37*  GLUCOSE 136* 132* 144*    Electrolytes  Recent Labs Lab 09/17/16 2222  09/19/16 0307 09/20/16 0316 09/21/16 0307  CALCIUM  8.7*  < > 8.5* 8.7* 9.1  MG 1.6*  --  1.9  --   --   PHOS 3.8  --  4.4 3.7 4.3  < > = values in this interval not displayed.  CBC  Recent Labs Lab 09/19/16 0307 09/20/16 0316 09/21/16 0304  WBC 3.6* 3.2* 4.2  HGB 8.6* 8.6* 8.8*  HCT 28.6* 28.8* 30.3*  PLT 173 186 201    Coag's No results for input(s): APTT, INR in the last 168 hours.  Sepsis Markers  Recent Labs Lab 09/15/16 0633 09/17/16 0533 09/17/16 2222  PROCALCITON <0.10 <0.10 <0.10    ABG  Recent Labs Lab 09/19/16 0900  PHART 7.246*  PCO2ART 74.9*  PO2ART 65.8*    Liver Enzymes  Recent Labs Lab 09/19/16 0307 09/20/16 0316 09/21/16 0307  ALBUMIN 2.2* 2.2* 2.3*    Cardiac Enzymes No results for input(s): TROPONINI, PROBNP in the last 168 hours.  Glucose  Recent Labs Lab 09/20/16 0012 09/20/16 0742 09/20/16 1246 09/20/16 1634 09/20/16 2132 09/21/16 0748  GLUCAP 112* 145* 171* 158* 141* 140*    Imaging Dg Chest Port 1 View  Result Date: 09/21/2016 CLINICAL DATA:  Shortness of breath, respiratory failure EXAM: PORTABLE CHEST 1 VIEW COMPARISON:  09/19/2016 FINDINGS: Heart is normal size. Patchy bilateral airspace opacities are again noted, best seen in the right upper  lobe, left upper lobe and left base. No real change. No visible significant effusions. No acute bony abnormality. IMPRESSION: Stable patchy bilateral airspace disease. Electronically Signed   By: Rolm Baptise M.D.   On: 09/21/2016 08:09     STUDIES:  Venous Duplex 11/16 >> negative for DVT  Bronch 11/18 >> RML, no DAH, some erythema blood rml  CT Chest 12/6 > consolidation in upper lobes and left lower lobe. Increase interval and mediastinal lymph nodes, borderline enlarged, maybe reactive   ANTIBIOTICS: Rocephin 11/21 - 11/24 Bactrim BID 11/30 >> Cefepime 11/14 - 11/16; 12/ 4 >> 12/11 Vancomycin 11/14 - 11/16; 12/4 >> 12/11 Diflucan 09/15/2016>> 12/9 Voriconazole 12/9 >  SEROLOGIES: 08/2016 Alpha-1 antitrypsin:  120 ANA: Negative Rheumatoid factor: 10.6 C3: 103 C4: 8 ESR 12/4 >> 70 CRP 12/4>> 16.2 DS DNA 12/4>> neg Histone AB 12/4/>> neg Smith AB 12/4>> neg  Assessment:  Acute hypoxic respiratory failure with b/l pulmonary infiltrates with Respiratory syncytial virus positive 08/28/16. Hx of COPD, chronic granulomatous disease.  Serology negative, cultures negative (abx d/c'd 12/11). - continue oxygen and Bipap - moderate fungus in sputum 12/05, speciation still pending >> continue voriconazole - continue albuterol, duonebs, solumedrol - lasix 32m now, place foley and document strict I&O's - goal negative fluid balance as tolerated - CXR PRN  Hx of HTN, HLD. - labetalol, lipitor  Hx of bipolar, anxiety. - risperdal, remeron, valproic acid  Steroid induced hyperglycemia. - lantus  Anemia of critical illness. - f/u CBC  Nutrition - start heart healthy / low sodium diet   RMontey Hora PA - C  Pulmonary & Critical Care Medicine Pager: [phone] or [phone]/09/2016, 11:21 AM   Breathing better.  Off Bipap.    Scattered crackles.  Assessment/plan:  Acute respiratory failure. - lasix - continue antifungals and f/u sputum culture  Updated family at bedside.  VChesley Mires MD [facility] HospitalP[facility]/Critical [facility] 09/21/2016, 12:22 PM Pager:  [phone] 3pm call: [phone]

## 2016-09-22 LAB — BASIC METABOLIC PANEL
Anion gap: 10 (ref 5–15)
BUN: 10 mg/dL (ref 6–20)
CALCIUM: 8.9 mg/dL (ref 8.9–10.3)
CO2: 39 mmol/L — ABNORMAL HIGH (ref 22–32)
CREATININE: 0.5 mg/dL (ref 0.44–1.00)
Chloride: 87 mmol/L — ABNORMAL LOW (ref 101–111)
Glucose, Bld: 169 mg/dL — ABNORMAL HIGH (ref 65–99)
Potassium: 4.1 mmol/L (ref 3.5–5.1)
SODIUM: 136 mmol/L (ref 135–145)

## 2016-09-22 LAB — CBC
HCT: 30.9 % — ABNORMAL LOW (ref 36.0–46.0)
HEMOGLOBIN: 9.1 g/dL — AB (ref 12.0–15.0)
MCH: 25.6 pg — ABNORMAL LOW (ref 26.0–34.0)
MCHC: 29.4 g/dL — AB (ref 30.0–36.0)
MCV: 87 fL (ref 78.0–100.0)
PLATELETS: 253 10*3/uL (ref 150–400)
RBC: 3.55 MIL/uL — ABNORMAL LOW (ref 3.87–5.11)
RDW: 18.3 % — AB (ref 11.5–15.5)
WBC: 6.4 10*3/uL (ref 4.0–10.5)

## 2016-09-22 LAB — HEPATIC FUNCTION PANEL
ALT: 11 U/L — ABNORMAL LOW (ref 14–54)
AST: 14 U/L — AB (ref 15–41)
Albumin: 2.4 g/dL — ABNORMAL LOW (ref 3.5–5.0)
Alkaline Phosphatase: 39 U/L (ref 38–126)
BILIRUBIN TOTAL: 0.4 mg/dL (ref 0.3–1.2)
Total Protein: 5.6 g/dL — ABNORMAL LOW (ref 6.5–8.1)

## 2016-09-22 LAB — GLUCOSE, CAPILLARY
GLUCOSE-CAPILLARY: 178 mg/dL — AB (ref 65–99)
Glucose-Capillary: 138 mg/dL — ABNORMAL HIGH (ref 65–99)
Glucose-Capillary: 155 mg/dL — ABNORMAL HIGH (ref 65–99)
Glucose-Capillary: 360 mg/dL — ABNORMAL HIGH (ref 65–99)
Glucose-Capillary: 269 mg/dL — ABNORMAL HIGH (ref 65–99)

## 2016-09-22 LAB — PHOSPHORUS: PHOSPHORUS: 4 mg/dL (ref 2.5–4.6)

## 2016-09-22 LAB — MAGNESIUM: MAGNESIUM: 1.6 mg/dL — AB (ref 1.7–2.4)

## 2016-09-22 MED ORDER — MAGNESIUM SULFATE 2 GM/50ML IV SOLN
2.0000 g | Freq: Once | INTRAVENOUS | Status: AC
Start: 2016-09-22 — End: 2016-09-22
  Administered 2016-09-22: 2 g via INTRAVENOUS
  Filled 2016-09-22: qty 50

## 2016-09-22 MED ORDER — FUROSEMIDE 10 MG/ML IJ SOLN
80.0000 mg | Freq: Once | INTRAMUSCULAR | Status: AC
  Administered 2016-09-22: 80 mg via INTRAVENOUS
  Filled 2016-09-22: qty 8

## 2016-09-22 NOTE — Progress Notes (Signed)
RT attempted flutter valve, Patient falling asleep, RT will continue to monitor.

## 2016-09-22 NOTE — Progress Notes (Signed)
PULMONARY / CRITICAL CARE MEDICINE   Name: Cheryl Wheeler MRN: [id number] DOB: 03/31/1966    ADMISSION DATE:  09/17/2016  REFERRING MD:  Dr. Patel   CHIEF COMPLAINT:  Dyspnea  BRIEF:  50 yo female presented to [facility] 08/22/16 with dyspnea, cough, fever, hypoxia, hyponatremia.  She remained on Bipap and transferred to [facility] for further therapy.  PMHx of COPD on home oxygen and Bipap, Chronic granulomatous disease, HTN, Bipolar, anxiety, GERD  SIGNIFICANT EVENTS: 11/12 - Admitted to [facility] where his acute on chronic COPD exacerbation and hypoosmolar hyponatremia 11/14 - Started on vancomycin and cefepime 11/16 - Transferred to [facility] ICU for possible tracheostomy due to continuous need for BiPAP and increased oxygen requirement.  11/17 - Intubated. CT chest negative for PE.  11/18 - Bronch RML, no DAH, some erthyema blood, BAL sent, tolerated 11/24 - extubated  11/26 - TRH primary  11/30 - Transfer to inpatient rehab   SUBJECTIVE:   Off bipap but on nocturnally.NSC, did not respond to lasix   VITAL SIGNS: BP (!) 165/69   Pulse 65   Temp 97.7 F (36.5 C) (Oral)   Resp 17   Ht 4' 11.5" (1.511 m)   Wt 178 lb 9.2 oz (81 kg)   SpO2 97%   BMI 35.46 kg/m   PHYSICAL EXAMINATION: General: adult female,in NAD, appears weak Neuro: follows commands HEENT: On 6L [place]. MMM Cardiac: regular Chest: b/l crackles, decreased bs Abd: soft, non tender Ext: 1+ edema Skin: no rashes  LABS:  BMET  Recent Labs Lab 09/20/16 0316 09/21/16 0307 09/22/16 0235  NA 137 140 136  K 4.8 4.2 4.1  CL 94* 91* 87*  CO2 31 39* 39*  BUN <5* 5* 10  CREATININE 0.32* 0.37* 0.50  GLUCOSE 132* 144* 169*    Electrolytes  Recent Labs Lab 09/17/16 2222  09/19/16 0307 09/20/16 0316 09/21/16 0307 09/22/16 0235  CALCIUM 8.7*  < > 8.5* 8.7* 9.1 8.9  MG 1.6*  --  1.9  --   --  1.6*  PHOS 3.8  --  4.4 3.7 4.3 4.0  < > = values in this interval not displayed.  CBC  Recent  Labs Lab 09/20/16 0316 09/21/16 0304 09/22/16 0235  WBC 3.2* 4.2 6.4  HGB 8.6* 8.8* 9.1*  HCT 28.8* 30.3* 30.9*  PLT 186 201 253    Coag's No results for input(s): APTT, INR in the last 168 hours.  Sepsis Markers  Recent Labs Lab 09/17/16 0533 09/17/16 2222  PROCALCITON <0.10 <0.10    ABG  Recent Labs Lab 09/19/16 0900  PHART 7.246*  PCO2ART 74.9*  PO2ART 65.8*    Liver Enzymes  Recent Labs Lab 09/20/16 0316 09/21/16 0307 09/22/16 0235  AST  --   --  14*  ALT  --   --  11*  ALKPHOS  --   --  39  BILITOT  --   --  0.4  ALBUMIN 2.2* 2.3* 2.4*    Cardiac Enzymes No results for input(s): TROPONINI, PROBNP in the last 168 hours.  Glucose  Recent Labs Lab 09/21/16 1125 09/21/16 1520 09/21/16 1918 09/21/16 2331 09/22/16 0347 09/22/16 0754  GLUCAP 182* 346* 126* 271* 138* 155*    Imaging No results found.   STUDIES:  Venous Duplex 11/16 >> negative for DVT  Bronch 11/18 >> RML, no DAH, some erythema blood rml  CT Chest 12/6 > consolidation in upper lobes and left lower lobe. Increase interval and mediastinal lymph nodes, borderline   enlarged, maybe reactive   ANTIBIOTICS: Rocephin 11/21 - 11/24 Bactrim BID 11/30 >> Cefepime 11/14 - 11/16; 12/ 4 >> 12/11 Vancomycin 11/14 - 11/16; 12/4 >> 12/11 Diflucan 09/15/2016>> 12/9 Voriconazole 12/9 >  SEROLOGIES: 08/2016 Alpha-1 antitrypsin: 120 ANA: Negative Rheumatoid factor: 10.6 C3: 103 C4: 8 ESR 12/4 >> 70 CRP 12/4>> 16.2 DS DNA 12/4>> neg Histone AB 12/4/>> neg Smith AB 12/4>> neg    Intake/Output Summary (Last 24 hours) at 09/22/16 0825 Last data filed at 09/22/16 0700  Gross per 24 hour  Intake             1270 ml  Output              811 ml  Net              459 ml   Assessment:  Acute hypoxic respiratory failure with b/l pulmonary infiltrates with Respiratory syncytial virus positive 08/28/16. Hx of COPD, chronic granulomatous disease.  Serology negative, cultures negative  (abx d/c'd 12/11). - continue oxygen and Bipap - moderate fungus in sputum 12/05, speciation still pending >> continue voriconazole - continue albuterol, duonebs, solumedrol - lasix 77m 12/12 with 500 +i/o, repeat lasix _0  12/13, place foley and document strict I&O's - goal negative fluid balance as tolerated - CXR PRN  Hx of HTN, HLD. - labetalol, lipitor  Hx of bipolar, anxiety. - risperdal, remeron, valproic acid  Steroid induced hyperglycemia. CBG (last 3)   Recent Labs  09/21/16 2331 09/22/16 0347 09/22/16 0754  GLUCAP 271* 138* 155*   - lantus  Anemia of critical illness. - f/u CBC  Nutrition -  heart healthy / low sodium diet   SRichardson LandryMinor ACNP LMaryanna ShapePCCM Pager [phone] 3 pm If no answer page [phone]/13/2017, 8:22 AM  Sitting in chair.  Breathing better.  Alert.  Basilar crackles.  HR regular.  No edema.  Hb 9.1, WBC 6.4  Assessment/plan:  Acute respiratory failure from RSV, and has fungal elements in sputum cx. - clinically improved since started voriconazole - continue BDs, solumedrol for COPD/granulomatous disease - lasix - f/u CXR intermittently - oxygen, Bipap  Keep in ICU today.  VChesley Mires MD LTexas [facility] Pres[facility] Hospital RockwallPulmonary/Critical Care 09/22/2016, 10:36 AM Pager:  [phone] 3pm call: [phone]

## 2016-09-22 NOTE — Progress Notes (Signed)
Inpatient Diabetes Program Recommendations  AACE/ADA: New Consensus Statement on Inpatient Glycemic Control (2015)  Target Ranges:  Prepandial:   less than 140 mg/dL      Peak postprandial:   less than 180 mg/dL (1-2 hours)      Critically ill patients:  140 - 180 mg/dL   Lab Results  Component Value Date   GLUCAP 155 (H) 09/22/2016   HGBA1C 6.5 (H) 08/29/2016    Review of Glycemic Control:  Results for Cheryl Wheeler, Cheryl Wheeler (MRN [id number]) as of 09/22/2016 08:50  Ref. Range 09/21/2016 15:20 09/21/2016 19:18 09/21/2016 23:31 09/22/2016 03:47 09/22/2016 07:54  Glucose-Capillary Latest Ref Range: 65 - 99 mg/dL 409346 (H) 811126 (H) 914271 (H) 138 (H) 155 (H)    Diabetes history: Diabetes Mellitus Outpatient Diabetes medications: Lantus 14 units daily Current orders for Inpatient glycemic control:  Novolog sensitive q 4 hours, Lantus 5 units q HS, Solumedrol 40 mg IV q 6 hours  Inpatient Diabetes Program Recommendations:   Please consider increasing Lantus to 10 units q HS.  Also consider increasing Novolog correction to moderate q 4 hours.  Thanks, Cheryl MeagerJenny Juandedios Dudash, RN, BC-ADM Inpatient Diabetes Coordinator Pager [phone] (8a-5p)

## 2016-09-22 NOTE — Progress Notes (Signed)
eLink Physician-Brief Progress Note Patient Name: Cheryl NissenJonce Ann Wheeler DOB: [birth year] MRN: [id number]   Date of Service  09/22/2016  HPI/Events of Note  Mg++ level = 1.6 and Creatinine = 0.50  eICU Interventions  Will replace Mg++.     Intervention Category Intermediate Interventions: Electrolyte abnormality - evaluation and management  Lenell AntuSommer,Avarose Mervine Eugene 09/22/2016, 6:36 PM

## 2016-09-22 NOTE — Evaluation (Signed)
Occupational Therapy Evaluation Patient Details Name: Cheryl Wheeler MRN: [id number] DOB: [birth year] Today's Date: 09/22/2016    History of Present Illness Pateint is a 50 year old female admitted for acute respiratory failure with hypoxemia on 08/18/16 from CIR. Patient intubated 08/27/16, extubated 09/03/16. CIR 09/09/16-09/17/16. PMH includes advanced COPD with 3LO2 nasal cannula use at home, chronic granulomatous disease, HTN, bipolar disorder, and anxiety.   Clinical Impression   Pt still demonstrates significant endurance issues as well as decreased balance and overall performance of basic selfcare tasks.  Recommend acute care OT to help increased ADL performance with recommendation for more intense rehab at CIR level in order to progress to maximum independence and return home with her spouse.      Follow Up Recommendations  CIR;Supervision/Assistance - 24 hour    Equipment Recommendations  Other (comment) (TBD next venue of care)    Recommendations for Other Services Rehab consult     Precautions / Restrictions Precautions Precautions: Fall Precaution Comments: 5L 02 via nasal cannula, monitor O2 sats  Restrictions Weight Bearing Restrictions: No      Mobility Bed Mobility Overal bed mobility: Needs Assistance         Sit to supine: Mod assist   General bed mobility comments: Needed assist to lift LEs into the bed.  Transfers Overall transfer level: Needs assistance Equipment used: Rolling walker (2 wheeled) Transfers: Stand Pivot Transfers Sit to Stand: Min assist Stand pivot transfers: Min assist       General transfer comment: Patient required cuing for hand placement on arm rests when rising, after patient had a failed attempt to power up with one hand on RW. Patient required min assist for trunk elevation and steadying upon standing.     Balance Overall balance assessment: Needs assistance Sitting-balance support: Bilateral upper extremity  supported;Feet supported Sitting balance-Leahy Scale: Fair Sitting balance - Comments: patient sitting independently in chair upon arrival. Sitting with back unsupported on EOB not tested.  Postural control: Posterior lean Standing balance support: Bilateral upper extremity supported Standing balance-Leahy Scale: Poor Standing balance comment: Pt needs UE support using the RW overall but could let go with one UE at a time for a few seconds.                             ADL Overall ADL's : Needs assistance/impaired Eating/Feeding: Independent   Grooming: Wash/dry hands;Wash/dry face;Set up;Sitting   Upper Body Bathing: Sitting;Minimal assistance   Lower Body Bathing: Moderate assistance;Sit to/from stand   Upper Body Dressing : Minimal assistance;Sitting   Lower Body Dressing: Maximal assistance;Sit to/from stand   Toilet Transfer: Minimal assistance;RW   Toileting- Clothing Manipulation and Hygiene: Moderate assistance;Sit to/from stand Toileting - Clothing Manipulation Details (indicate cue type and reason): simulated     Functional mobility during ADLs: Minimal assistance General ADL Comments: Pt used RW for support in static standing and with functional transfers.  Only able to tolerate standing statically for 30-45 seconds during 2 intervals before needing to sit secondary to fatigue.  Oxygen sats fluctuated from 85-93% on 5Ls during session.  Decreased ability to reach either foot for simulated dressing.  Reports that spouse assists with donning socks and shoes at home.       Vision Vision Assessment?: No apparent visual deficits   Perception Perception Perception Tested?: No   Praxis Praxis Praxis tested?: Within functional limits    Pertinent Vitals/Pain Pain Assessment: Faces Pain Score: 5  Faces Pain Scale: Hurts little more Pain Location: low back  Pain Descriptors / Indicators: Aching Pain Intervention(s): Monitored during session;Repositioned      Hand Dominance Right   Extremity/Trunk Assessment Upper Extremity Assessment Upper Extremity Assessment: RUE deficits/detail;LUE deficits/detail RUE Deficits / Details: AROM shoulder flexion 0- 90 degrees, AAROM 0-130 degrees,  AROM WFLs for elbow with strength 3+/5 and grip alos 3+/5.  Pt with history of radial nerve palsy demonstrates decreased full digit extension in the middle digit.  LUE Deficits / Details: AROM shoulder flexion 0-90 degrees.  AROM WFLS for elbow and digits, strength 4/5 for elbow flexion and grip strength   Lower Extremity Assessment Lower Extremity Assessment: Defer to PT evaluation RLE Deficits / Details: stength 2+/5 hip flexion, 3+/5 knee extension and knee flexion.  LLE Deficits / Details: stength 2+/5 hip flexion, 3+/5 knee extension and knee flexion.    Cervical / Trunk Assessment Cervical / Trunk Assessment: Other exceptions (flexed trunk in standing secondary to back pain)   Communication Communication Communication: No difficulties   Cognition Arousal/Alertness: Awake/alert Behavior During Therapy: WFL for tasks assessed/performed;Flat affect Overall Cognitive Status: Within Functional Limits for tasks assessed                                Home Living Family/patient expects to be discharged to:: Inpatient rehab Living Arrangements: Spouse/significant other Available Help at Discharge: Family;Available 24 hours/day Type of Home: Mobile home Home Access: Stairs to enter Entrance Stairs-Number of Steps: 5  Entrance Stairs-Rails: Left Home Layout: One level     Bathroom Shower/Tub: Producer, television/film/videoWalk-in shower   Bathroom Toilet: Standard     Home Equipment: Environmental consultantWalker - 2 wheels;Bedside commode;Shower seat;Cane - single point;Wheelchair - manual      Lives With: Spouse    Prior Functioning/Environment Level of Independence: Needs assistance  Gait / Transfers Assistance Needed: Modified independent with rolling walker for household  distances. Uses w/c if out ADL's / Homemaking Assistance Needed: mod independent with basic ADL. Husband cooked/cleaned. Became SOB with ADL at times. Used w/c for community distances at baseline            OT Problem List: Decreased strength;Decreased activity tolerance;Impaired balance (sitting and/or standing);Cardiopulmonary status limiting activity;Impaired UE functional use;Decreased knowledge of use of DME or AE   OT Treatment/Interventions: Self-care/ADL training;Balance training;Therapeutic activities;DME and/or AE instruction;Patient/family education;Therapeutic exercise;Neuromuscular education    OT Goals(Current goals can be found in the care plan section) Acute Rehab OT Goals Patient Stated Goal: Pt wants to go back to inpatient rehab to get better for return to home.  OT Goal Formulation: With patient Time For Goal Achievement: 10/06/16 Potential to Achieve Goals: Good  OT Frequency: Min 2X/week              End of Session Equipment Utilized During Treatment: Rolling walker;Oxygen Nurse Communication: Mobility status  Activity Tolerance: Patient limited by fatigue Patient left: in bed;with call bell/phone within reach   Time: [id number] OT Time Calculation (min): 31 min Charges:  OT General Charges $OT Visit: 1 Procedure OT Evaluation $OT Eval Moderate Complexity: 1 Procedure OT Treatments $Self Care/Home Management : 8-22 mins  Marieann Zipp .lxx 09/22/2016, 3:55 PM

## 2016-09-22 NOTE — Evaluation (Signed)
Physical Therapy Evaluation Patient Details Name: Cheryl Wheeler MRN: [id number] DOB: [birth year] Today's Date: 09/22/2016   History of Present Illness  Pateint is a 50 year old female admitted for acute respiratory failure with hypoxemia on 08/18/16 from CIR. Patient intubated 08/27/16, extubated 09/03/16. CIR 09/09/16-09/17/16. PMH includes advanced COPD with 3LO2 nasal cannula use at home, chronic granulomatous disease, HTN, bipolar disorder, and anxiety.  Clinical Impression  Patient presents with cardiopulmonary limitations, and decreased balance, mobility, and lower extremity strength and would benefit from physical therapy to address these deficits. Patient maintained O2sat greater than 90% during the session when bumped up to 6 L O2 high flow nasal cannula, but patient self-limited ambulation due to reported shortness of breath. She notes that it typically takes her increased time to recover, up to 20 minutes. Patient tolerated ambulation and exercises well, although she has only fair prognosis to return to PLOF due to advanced COPD and multiple bouts of respiratory failure. Will continue to follow.  SPO2 during session, 6 L high flow nasal cannula: 91-93% HR during session: 75-80     Follow Up Recommendations CIR    Equipment Recommendations  None recommended by PT    Recommendations for Other Services       Precautions / Restrictions Precautions Precautions: Fall Restrictions Weight Bearing Restrictions: No      Mobility  Bed Mobility               General bed mobility comments: sitting in recliner upon arrival   Transfers Overall transfer level: Needs assistance   Transfers: Sit to/from Stand Sit to Stand: Min assist         General transfer comment: Patient required cuing for hand placement on arm rests when rising, after patient had a failed attempt to power up with one hand on RW. Patient required min assist for trunk elevation and steadying upon  standing.   Ambulation/Gait Ambulation/Gait assistance: Min assist;+2 safety/equipment (lines/leads management ) Ambulation Distance (Feet): 15 Feet Assistive device: Rolling walker (2 wheeled) Gait Pattern/deviations: Step-to pattern;Decreased stride length;Trunk flexed Gait velocity: decreased  Gait velocity interpretation: Below normal speed for age/gender General Gait Details: Patient required min assit steadying lines/leads managment. Cues for hand placement. Patient declined to ambulate 15 more feet when asked to ambulate a second time.   Stairs            Wheelchair Mobility    Modified Rankin (Stroke Patients Only)       Balance Overall balance assessment: Needs assistance Sitting-balance support: Bilateral upper extremity supported;Feet supported Sitting balance-Leahy Scale: Fair Sitting balance - Comments: patient sitting independently in chair upon arrival. Sitting with back unsupported on EOB not tested.  Postural control: Posterior lean Standing balance support: Bilateral upper extremity supported Standing balance-Leahy Scale: Poor Standing balance comment: required RW for support                             Pertinent Vitals/Pain Pain Assessment: 0-10 Pain Score: 5  Pain Location: low back  Pain Descriptors / Indicators: Aching Pain Intervention(s): Monitored during session    Home Living Family/patient expects to be discharged to:: Inpatient rehab Living Arrangements: Spouse/significant other Available Help at Discharge: Family;Available 24 hours/day Type of Home: Mobile home Home Access: Stairs to enter Entrance Stairs-Rails: Left Entrance Stairs-Number of Steps: 5  Home Layout: One level Home Equipment: Walker - 2 wheels;Bedside commode;Shower seat;Cane - single point;Wheelchair - manual  Prior Function Level of Independence: Needs assistance   Gait / Transfers Assistance Needed: Modified independent with rolling walker for  household distances. Uses w/c if out  ADL's / Homemaking Assistance Needed: mod independent with basic ADL. Husband cooked/cleaned. Became SOB with ADL at times. Used w/c for community distances at baseline        Hand Dominance   Dominant Hand: Right    Extremity/Trunk Assessment   Upper Extremity Assessment Upper Extremity Assessment: Generalized weakness    Lower Extremity Assessment Lower Extremity Assessment: RLE deficits/detail;LLE deficits/detail RLE Deficits / Details: stength 2+/5 hip flexion, 3+/5 knee extension and knee flexion.  LLE Deficits / Details: stength 2+/5 hip flexion, 3+/5 knee extension and knee flexion.        Communication   Communication: No difficulties  Cognition Arousal/Alertness: Awake/alert Behavior During Therapy: WFL for tasks assessed/performed;Flat affect Overall Cognitive Status: Within Functional Limits for tasks assessed                      General Comments      Exercises General Exercises - Lower Extremity Long Arc Quad: AROM;Both;10 reps;Seated Hip Flexion/Marching: AROM;Both;10 reps;Seated Toe Raises: AROM;Both;10 reps;Seated   Assessment/Plan    PT Assessment Patient needs continued PT services  PT Problem List Decreased strength;Decreased activity tolerance;Decreased balance;Decreased mobility;Decreased coordination;Decreased knowledge of use of DME;Cardiopulmonary status limiting activity          PT Treatment Interventions DME instruction;Gait training;Stair training;Functional mobility training;Therapeutic activities;Therapeutic exercise;Balance training;Patient/family education    PT Goals (Current goals can be found in the Care Plan section)  Acute Rehab PT Goals Patient Stated Goal: return to ambulation home distances  PT Goal Formulation: With patient Time For Goal Achievement: 10/06/16 Potential to Achieve Goals: Fair    Frequency Min 3X/week   Barriers to discharge        Co-evaluation                End of Session Equipment Utilized During Treatment: Gait belt;Oxygen Activity Tolerance: Patient limited by fatigue Patient left: in chair;with call bell/phone within reach Nurse Communication: Mobility status         Time: [id number] PT Time Calculation (min) (ACUTE ONLY): 24 min   Charges:   PT Evaluation $PT Eval Moderate Complexity: 1 Procedure     PT G Codes:        Granger Chui 09/22/2016, 2:26 PM   Oracio Galen SPT [phone]

## 2016-09-22 NOTE — Progress Notes (Signed)
I visited Mrs. Cheryl Wheeler at the bedside.  She is currently sitting up in the recliner and has walked a short distance with the RN this am.  Pt. says she wants to come back to CIR when she is medically ready.  She is aware that we will have to submit for insurance authorization and have bed availability to be able to readmit her to CIR.  My co-worker Cheryl Wheeler will follow up tomorrow.  Please call if questions.  Cheryl Wheeler PT Inpatient Rehab Admissions Coordinator Cell [phone] Office [phone]

## 2016-09-23 ENCOUNTER — Inpatient Hospital Stay (HOSPITAL_COMMUNITY): Payer: Medicare HMO

## 2016-09-23 LAB — CBC
HCT: 30.9 % — ABNORMAL LOW (ref 36.0–46.0)
Hemoglobin: 9.2 g/dL — ABNORMAL LOW (ref 12.0–15.0)
MCH: 25.5 pg — ABNORMAL LOW (ref 26.0–34.0)
MCHC: 29.8 g/dL — AB (ref 30.0–36.0)
MCV: 85.6 fL (ref 78.0–100.0)
PLATELETS: 250 10*3/uL (ref 150–400)
RBC: 3.61 MIL/uL — ABNORMAL LOW (ref 3.87–5.11)
RDW: 17.8 % — AB (ref 11.5–15.5)
WBC: 5.7 10*3/uL (ref 4.0–10.5)

## 2016-09-23 LAB — GLUCOSE, CAPILLARY
GLUCOSE-CAPILLARY: 173 mg/dL — AB (ref 65–99)
GLUCOSE-CAPILLARY: 183 mg/dL — AB (ref 65–99)
GLUCOSE-CAPILLARY: 281 mg/dL — AB (ref 65–99)
Glucose-Capillary: 167 mg/dL — ABNORMAL HIGH (ref 65–99)
Glucose-Capillary: 198 mg/dL — ABNORMAL HIGH (ref 65–99)
Glucose-Capillary: 193 mg/dL — ABNORMAL HIGH (ref 65–99)

## 2016-09-23 LAB — BASIC METABOLIC PANEL
Anion gap: 10 (ref 5–15)
BUN: 9 mg/dL (ref 6–20)
CALCIUM: 8.8 mg/dL — AB (ref 8.9–10.3)
CO2: 41 mmol/L — AB (ref 22–32)
CREATININE: 0.4 mg/dL — AB (ref 0.44–1.00)
Chloride: 83 mmol/L — ABNORMAL LOW (ref 101–111)
GFR calc Af Amer: >60 mL/min (ref >60)
GFR calc non Af Amer: >60 mL/min (ref >60)
GLUCOSE: 160 mg/dL — AB (ref 65–99)
Potassium: 3.8 mmol/L (ref 3.5–5.1)
Sodium: 134 mmol/L — ABNORMAL LOW (ref 135–145)

## 2016-09-23 MED ORDER — FUROSEMIDE 10 MG/ML IJ SOLN
80.0000 mg | Freq: Once | INTRAMUSCULAR | Status: AC
  Administered 2016-09-23: 80 mg via INTRAVENOUS
  Filled 2016-09-23: qty 8

## 2016-09-23 MED ORDER — HYDRALAZINE HCL 20 MG/ML IJ SOLN
10.0000 mg | INTRAMUSCULAR | Status: DC | PRN
  Administered 2016-09-23: 10 mg via INTRAVENOUS
  Filled 2016-09-23: qty 1

## 2016-09-23 MED ORDER — AMLODIPINE BESYLATE 5 MG PO TABS
5.0000 mg | ORAL_TABLET | Freq: Every day | ORAL | Status: DC
  Administered 2016-09-23 – 2016-09-25 (×3): 5 mg via ORAL
  Filled 2016-09-23 (×4): qty 1

## 2016-09-23 NOTE — PMR Pre-admission (Signed)
PMR Admission Coordinator Pre-Admission Assessment  Patient: Cheryl Wheeler is an 50 y.o., female MRN: [id number] DOB: [birth year] Height: 4' 11.5" (151.1 cm) Weight: 82.7 kg (182 lb 5.1 oz)              Insurance Information HMO:     PPO: X     PCP:      IPA:      80/20:      OTHER:  PRIMARY: AETNA Medicare       Policy#: [id number]      Subscriber: Self CM Name: Regino Bellow      Phone#: [phone]     Fax#: [phone] Pre-Cert#: [id number] approved  for 7 days with clinical updates due on or by 10/04/16 to [place], phone [phone], fax [phone]    Employer: Disabled  Benefits:  Phone #: [phone]     Name: reference # [id number] Eff. 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 Per chart review patient lives with spouse. One level homewith 5 steps to entry. Modified independent with rolling walker for household distances prior to admission. Uses a  wheelchair outside of the home. Presented to [facility] 08/22/2016 for acute on chronic respiratory failure with low-grade fever and hypoxic in the 70s and required intubation. Findings of mild hyponatremia 128. Started on Solu-Medrol and nebulizer treatments. Transferred to [facility] 08/26/2016 for ongoing medical care. Chest x-ray was negative for acute abnormality. CT angiogram of the chest negative for pulmonary embolibut concerning for pneumonia. RSV-B positive. Completed a course of broad-spectrum antibiotics.Pulmonary care service follow-up remain intubated through 09/03/2016.Prednisone Dosepak taper as directed. Physicaland occupationaltherapy evaluationscompleted 09/07/2016 with recommendations of physical medicine rehabilitation consult.Patient was admitted for a comprehensive rehabilitation program 09/09/2016. She was able to propel her wheelchair with moderate cueing as well as ambulate very short distances due to fatigue. A recent CT the chest 09/15/2016 at the discretion of pulmonary services for follow-up of suspect HCAP showed consolidation in upper lobes and left lower lobe and advised to continue antibiotic therapy that she had remained on with cefepime and vancomycin times an 8 day course and completed. On 09/17/2016 with increasing shortness of breath oxygen saturations dipping into the 70s and maintained on Bipap. Chest x-ray showed no new abnormalities. She denied any chest pain. Pulmonary service follow-up and felt patient should  be discharged to acute care services to address pulmonary issues 09/17/2016. Placed on Solu-Medrol and tapered. Latest chest x-ray 09/23/2016 showing stable bilateral lung opacities.  Placed on Solu-Medrol and tapered. Latest chest x-ray 09/23/2016 showing stable bilateral lung opacities. Presently maintained on voriconazole secondary to acute respiratory failure. Tracheal aspirate growing moderate fungus awaiting speciation. Placed on  subcutaneous Lovenox for DVT prophylaxis 09/24/2016. Patient again admitted to resume full inpatient rehabilitation therapies.       Past Medical History  Past Medical History:  Diagnosis Date  . Anxiety   . Bipolar disorder (HCC)   . COPD (chronic obstructive pulmonary disease) (HCC)   . GERD (gastroesophageal reflux disease)   . Hypertension     Family History  family history is not on file.  Prior Rehab/Hospitalizations:  Has the patient had major surgery during 100 days prior to admission? No  Current Medications   Current Facility-Administered Medications:  .  acetaminophen (TYLENOL) tablet 650 mg, 650 mg, Oral, Q6H PRN, Lonia Blood, MD, 650 mg at 09/27/16 1550 .  albuterol (PROVENTIL) (2.5 MG/3ML) 0.083% nebulizer solution 2.5 mg, 2.5 mg, Inhalation, Q2H PRN, Lonia Blood, MD .  amLODipine (NORVASC) tablet 10 mg, 10 mg, Oral, Daily, Lonia Blood, MD, 10 mg at 09/28/16 1610 .  atorvastatin (LIPITOR) tablet 10 mg, 10 mg, Oral, Daily, Lupita Leash, MD, 10 mg at 09/28/16 0921 .  chlorhexidine (PERIDEX) 0.12 % solution 15 mL, 15 mL, Mouth Rinse, BID, Lupita Leash, MD, 15 mL at 09/28/16 0920 .  enoxaparin (LOVENOX) injection 40 mg, 40 mg, Subcutaneous, Q24H, Emi Holes, RPH, 40 mg at 09/27/16 1258 .  feeding supplement (BOOST / RESOURCE BREEZE) liquid 1 Container, 1 Container, Oral, TID BM, Lupita Leash, MD, 1 Container at 09/28/16 0919 .  furosemide (LASIX) tablet 40 mg, 40 mg, Oral, BID, Lonia Blood, MD, 40 mg at 09/28/16 9604 .  Gerhardt's butt cream, , Topical, TID, Lupita Leash, MD .  guaiFENesin-dextromethorphan (ROBITUSSIN DM) 100-10 MG/5ML syrup 5 mL, 5 mL, Oral, Q4H PRN, Lupita Leash, MD .  hydrALAZINE (APRESOLINE) injection 10-20 mg, 10-20 mg, Intravenous, Q4H PRN, Duayne Cal, NP, 10 mg at 09/23/16 0437 .  hydrOXYzine (ATARAX/VISTARIL) tablet 25 mg, 25 mg, Oral, Q6H PRN, Lupita Leash, MD, 25 mg at 09/28/16  5409 .  insulin aspart (novoLOG) injection 0-20 Units, 0-20 Units, Subcutaneous, TID WC, Coralyn Helling, MD, 11 Units at 09/27/16 1820 .  insulin aspart (novoLOG) injection 0-5 Units, 0-5 Units, Subcutaneous, QHS, Coralyn Helling, MD, 5 Units at 09/27/16 2152 .  insulin glargine (LANTUS) injection 16 Units, 16 Units, Subcutaneous, BID, Marinda Elk, MD, 16 Units at 09/28/16 0920 .  ipratropium-albuterol (DUONEB) 0.5-2.5 (3) MG/3ML nebulizer solution 3 mL, 3 mL, Nebulization, TID, Richarda Overlie, MD, 3 mL at 09/28/16 0719 .  labetalol (NORMODYNE) tablet 300 mg, 300 mg, Oral, TID, Lupita Leash, MD, 300 mg at 09/28/16 0921 .  LORazepam (ATIVAN) tablet 0.5 mg, 0.5 mg, Oral, Q8H PRN, Lupita Leash, MD, 0.5 mg at 09/27/16 1958 .  MEDLINE mouth rinse, 15 mL, Mouth Rinse, q12n4p, Lupita Leash, MD, 15 mL at 09/27/16 1628 .  mirtazapine (REMERON) tablet 45 mg, 45 mg, Oral, QHS, Lupita Leash, MD, 45 mg at 09/27/16 2100 .  morphine (MSIR) tablet 15 mg, 15 mg, Oral, BID PRN, Lupita Leash, MD, 15 mg at 09/28/16 0926 .  ondansetron (ZOFRAN) injection 4 mg, 4 mg, Intravenous, Q6H PRN, Riley Lam  Leandrew KoyanagiB McQuaid, MD .  pantoprazole (PROTONIX) EC tablet 40 mg, 40 mg, Oral, Daily, Lupita Leashouglas B McQuaid, MD, 40 mg at 09/28/16 0921 .  predniSONE (DELTASONE) tablet 20 mg, 20 mg, Oral, BID WC, Lonia BloodJeffrey T McClung, MD, 20 mg at 09/28/16 [id number] .  sulfamethoxazole-trimethoprim (BACTRIM DS,SEPTRA DS) 800-160 MG per tablet 1 tablet, 1 tablet, Oral, Q12H, Coralyn HellingVineet Sood, MD, 1 tablet at 09/28/16 0921 .  valproic acid (DEPAKENE) 250 MG capsule 500 mg, 500 mg, Oral, BID, Lupita Leashouglas B McQuaid, MD, 500 mg at 09/28/16 [id number] .  voriconazole (VFEND) tablet 250 mg, 250 mg, Oral, Q12H, Bertram MillardMichael A Maccia, RPH, 250 mg at 09/28/16 [id number]  Patients Current Diet: Diet heart healthy/carb modified Room service appropriate? Yes; Fluid consistency: Thin Diet - low sodium heart healthy Diet Carb Modified  Precautions /  Restrictions Precautions Precautions: Fall Precaution Comments: watch sats Restrictions Weight Bearing Restrictions: No   Has the patient had 2 or more falls or a fall with injury in the past year?No  Prior Activity Level Household: Prior to admission patient was disabled due to breathing issues. Spouse drove and patient left the house about once a month for medical appointments.   Home Assistive Devices / Equipment Home Assistive Devices/Equipment: Wheelchair, Bedside commode/3-in-1, Shower chair without back, Environmental consultantWalker (specify type), BIPAP, Hand-held shower hose, Oxygen, Eyeglasses Home Equipment: Environmental consultantWalker - 2 wheels, Bedside commode, Shower seat, Cane - single point, Wheelchair - manual  Prior Device Use: Indicate devices/aids used by the patient prior to current illness, exacerbation or injury? Walker  Prior Functional Level Prior Function Level of Independence: Needs assistance Gait / Transfers Assistance Needed: Modified independent with rolling walker for household distances. Uses w/c if out ADL's / Homemaking Assistance Needed: mod independent with basic ADL. Husband cooked/cleaned. Became SOB with ADL at times. Used w/c for community distances at baseline  Self Care: Did the patient need help bathing, dressing, using the toilet or eating? Needed some help  Indoor Mobility: Did the patient need assistance with walking from room to room (with or without device)? Independent  Stairs: Did the patient need assistance with internal or external stairs (with or without device)? Independent  Functional Cognition: Did the patient need help planning regular tasks such as shopping or remembering to take medications? Independent  Current Functional Level Cognition  Overall Cognitive Status: Within Functional Limits for tasks assessed Orientation Level: Oriented X4    Extremity Assessment (includes Sensation/Coordination)  Upper Extremity Assessment: RUE deficits/detail, LUE  deficits/detail RUE Deficits / Details: AROM shoulder flexion 0- 90 degrees, AAROM 0-130 degrees,  AROM WFLs for elbow with strength 3+/5 and grip alos 3+/5.  Pt with history of radial nerve palsy demonstrates decreased full digit extension in the middle digit.  LUE Deficits / Details: AROM shoulder flexion 0-90 degrees.  AROM WFLS for elbow and digits, strength 4/5 for elbow flexion and grip strength  Lower Extremity Assessment: Defer to PT evaluation RLE Deficits / Details: stength 2+/5 hip flexion, 3+/5 knee extension and knee flexion.  LLE Deficits / Details: stength 2+/5 hip flexion, 3+/5 knee extension and knee flexion.     ADLs  Overall ADL's : Needs assistance/impaired Eating/Feeding: Independent Grooming: Wash/dry hands, Wash/dry face, Set up, Sitting Upper Body Bathing: Sitting, Minimal assistance Lower Body Bathing: Moderate assistance, Sit to/from stand Upper Body Dressing : Minimal assistance, Sitting Lower Body Dressing: Maximal assistance, Sit to/from stand Toilet Transfer: Min guard, Ambulation, Stand-pivot (bed>recliner) Toileting- Clothing Manipulation and Hygiene: Moderate assistance, Sit to/from stand Toileting - ArchitectClothing Manipulation Details (  indicate cue type and reason): simulated Functional mobility during ADLs: Minimal assistance, Rolling walker General ADL Comments: Pt with DOE 3/4, but sats remained >92% on 4L supplemental 02    Mobility  Overal bed mobility: Needs Assistance Bed Mobility: Supine to Sit Supine to sit: Min guard Sit to supine: Mod assist General bed mobility comments: cues for technique    Transfers  Overall transfer level: Needs assistance Equipment used: Rolling walker (2 wheeled) Transfers: Sit to/from Stand Sit to Stand: Min assist Stand pivot transfers: Min guard General transfer comment: assist for balance up from EOB, then from chair with cues for hand placement    Ambulation / Gait / Stairs / Wheelchair Mobility   Ambulation/Gait Ambulation/Gait assistance: Min assist, +2 safety/equipment Ambulation Distance (Feet): 15 Feet Assistive device: Rolling walker (2 wheeled) Gait Pattern/deviations: Step-to pattern, Trunk flexed, Shuffle, Decreased stride length General Gait Details: poor tolerance to activity on 3L O2 today with SpO2 down to 78%; raised to 4L O2 for consistent improvement to 90%; needed encouragement for walking with pt request to just walk in the room.  Gait velocity: decreased  Gait velocity interpretation: Below normal speed for age/gender    Posture / Balance Dynamic Sitting Balance Sitting balance - Comments: patient sitting independently in chair upon arrival. Sitting with back unsupported on EOB not tested.  Balance Overall balance assessment: Needs assistance Sitting-balance support: Feet supported Sitting balance-Leahy Scale: Good Sitting balance - Comments: patient sitting independently in chair upon arrival. Sitting with back unsupported on EOB not tested.  Postural control: Posterior lean Standing balance support: Bilateral upper extremity supported Standing balance-Leahy Scale: Poor Standing balance comment: needs UE support for balance    Special needs/care consideration BiPAP/CPAP: BiPAP at night CPM: No Continuous Drip IV: No Dialysis: No        Life Vest: No Oxygen: 2L [place] PTA Special Bed: No Trach Size: No Wound Vac (area): No       Skin: Bruises on arms                               Bowel mgmt: 09/26/16 last BM; pt. Reports some incontinence Bladder mgmt: foley catheter Diabetic mgmt: Diet monitoring PTA     Previous Home Environment Living Arrangements: Spouse/significant other  Lives With: Spouse Available Help at Discharge: Family, Available 24 hours/day Type of Home: Mobile home Home Layout: One level Home Access: Stairs to enter Entrance Stairs-Rails: Left Entrance Stairs-Number of Steps: 5  Bathroom Shower/Tub: Health visitorWalk-in shower Bathroom Toilet:  Standard Home Care Services: Yes Type of Home Care Services: Home RN [facility]  Discharge Living Setting Plans for Discharge Living Setting: Patient's home, Lives with (comment) Type of Home at Discharge: Mobile home Discharge Home Layout: One level Discharge Home Access: Stairs to enter Entrance Stairs-Rails: Left Entrance Stairs-Number of Steps: 5 Discharge Bathroom Shower/Tub: Walk-in shower Discharge Bathroom Toilet: Standard Discharge Bathroom Accessibility: Yes How Accessible: Accessible via walker Does the patient have any problems obtaining your medications?: Yes (Describe)  Social/Family/Support Systems Patient Roles: Spouse, Parent Contact Information: Spouse: Christin BachRonnie Blankenburg [phone] Anticipated Caregiver: Spouse  Ability/Limitations of Caregiver: None - pt reports he is also on disability, however, independent and able to assist her. Caregiver Availability: 24/7 Discharge Plan Discussed with Primary Caregiver: Yes Is Caregiver In Agreement with Plan?: Yes Does Caregiver/Family have Issues with Lodging/Transportation while Pt is in Rehab?: No  Goals/Additional Needs Patient/Family Goal for Rehab: PT  Mod I; OT Mod I-Supervision  Expected length of stay: 13-16 days Cultural Considerations: None Dietary Needs: Carb. Mod. Equipment Needs: TBD Special Service Needs: None Additional Information: Patient has been admitted to [facility] for beathing issues in September and October Pt/Family Agrees to Admission and willing to participate: Yes Program Orientation Provided & Reviewed with Pt/Caregiver Including Roles  & Responsibilities: Yes Additional Information Needs: None Information Needs to be Provided By: N/A   Decrease burden of Care through IP rehab admission: No  Possible need for SNF placement upon discharge: No  Patient Condition: This patient's medical and functional status has changed since the consult dated:  09/07/16 in which the Rehabilitation Physician determined and documented that the patient's condition is appropriate for intensive rehabilitative care in an inpatient rehabilitation facility. See "History of Present Illness" (above) for medical update. Functional changes are: Pt.  Ambulating with min assist +2 and rolling walker and drops in O2 sats to  78% on 3L O2 and up to 90% when O2 titrated up to 4L O2.; min guard assist for toilet transfers . Patient's medical and functional status update has been discussed with the Rehabilitation physician and patient remains appropriate for inpatient rehabilitation. Will admit to inpatient rehab today.   Preadmission Screen Completed By:  Fae Pippin, 09/28/2016 12:09 PM ______________________________________________________________________   Discussed status with Dr.  Allena Katz on 09/28/16 at  1209  and received approval for admission today.  Admission Coordinator:  Weldon Picking, time 1209 Dorna Bloom 09/28/16

## 2016-09-23 NOTE — Progress Notes (Signed)
PCCM INTERVAL PROGRESS NOTE  Asked to eval re: HTN. Last BP 170/74 and she has been hypertensive (SBP 160-170) throughout the night despite scheduled labetalol 300mg  and lasix 80mg . Will add PRN hydralazine to keep SBP < 170mmHg and scheduled amlodipine 5mg .   Joneen RoachPaul Hoffman, AGACNP-BC [facility] Pager [phone] or [phone]  09/23/2016 4:15 AM

## 2016-09-23 NOTE — Progress Notes (Signed)
Pharmacy Antibiotic Note  [age] YOF with chronic granulomatous disease and maintained on Bactrim for prophylaxis followed by immunologist outpatient.   Continues on voriconazole secondary to acute respiratory failure with bilateral pulmonary infiltrates. Tracheal aspirate growing mod fungus - awaiting speciation. 12/8 Aspergillus Ag slightly elevated. Respiratory syncytial virus positive 08/28/16. R/O autoimmune granuloma vs infectious process. Remains afeb, WBC WNL. PCT neg on 12/4. Serology and cultures negative.   Plan -Continue voriconazole 250 mg (~4 mg/kg Adj BW) IV every 12 hours  -F/U speciation of fungal culture -Monitor LFTs   Height: 4' 11.5" (151.1 cm) Weight: 178 lb 9.2 oz (81 kg) IBW/kg (Calculated) : 44.35  Temp (24hrs), Avg:98.1 F (36.7 C), Min:97.8 F (36.6 C), Max:98.7 F (37.1 C)   Recent Labs Lab 09/16/16 1430  09/19/16 0307 09/19/16 1201 09/20/16 0316 09/21/16 0304 09/21/16 0307 09/22/16 0235 09/23/16 0337  WBC  --   < > 3.6*  --  3.2* 4.2  --  6.4 5.7  CREATININE  --   < > 0.48  --  0.32*  --  0.37* 0.50 0.40*  VANCOTROUGH 8*  --   --  17  --   --   --   --   --   < > = values in this interval not displayed.  Estimated Creatinine Clearance: 77.5 mL/min (by C-G formula based on SCr of 0.4 mg/dL (L)).    Allergies  Allergen Reactions  . Fentanyl Shortness Of Breath  . Meperidine Other (See Comments)    chest pain-"feels like squeezing heart"  . Azithromycin Other (See Comments)    VRE  . Codeine Nausea Only  . Levofloxacin Other (See Comments)    Unknown  . Other Other (See Comments)    BLACK STICHES=BODY REJECTS THEM  . Penicillins Rash  . Sulfa Antibiotics Rash    Tolerates Bactrim    Antimicrobials this admission:  Vancomycin 12/4 >>12/11 Cefepime 12/4 >>12/11 Fluconazole 12/06 >> 12/8 Voriconazole 12/8 >> On chronic Bactrim      Dose adjustments this admission:  12/07: VT 8 (Goal 15-20) > change to 1g IV q8 12/10: VT 17 > continue  1g IV q8 12/11: Voriconazole 6 mg/kg q12h >> ~4 mg/kg Adj BW q12h   Microbiology results:  12/5 UCx: negF 12/5 Sputum: moderate fungus 12/8 aspergillus Ag: 0.6 - range 0 - 0.49  Thank you for allowing pharmacy to be a part of this patient's care.  Sherle Poeob Vincent, PharmD Clinical Pharmacist 10:52 AM, 09/23/2016

## 2016-09-23 NOTE — Progress Notes (Signed)
Occupational Therapy Treatment Patient Details Name: Cheryl Wheeler MRN: [id number] DOB: Aug 25, 1966 Today's Date: 09/23/2016    History of present illness Pateint is a [age] year old female admitted for acute respiratory failure with hypoxemia on 08/18/16 from CIR. Patient intubated 08/27/16, extubated 09/03/16. CIR 09/09/16-09/17/16. PMH includes advanced COPD with 3LO2 nasal cannula use at home, chronic granulomatous disease, HTN, bipolar disorder, and anxiety.   OT comments  Pt making steady improvements.  She is able to perform functional transfers with min A.  02 sats remained >92% on 4L supplemental 02.   Continue to recommend CIR.  Will follow acutely.   Follow Up Recommendations  CIR;Supervision/Assistance - 24 hour    Equipment Recommendations  None recommended by OT    Recommendations for Other Services Rehab consult    Precautions / Restrictions Precautions Precautions: Fall       Mobility Bed Mobility Overal bed mobility: Needs Assistance Bed Mobility: Sit to Supine       Sit to supine: Mod assist   General bed mobility comments: assist to lift LEs into bed   Transfers Overall transfer level: Needs assistance Equipment used: Rolling walker (2 wheeled) Transfers: Sit to/from UGI CorporationStand;Stand Pivot Transfers Sit to Stand: Min assist Stand pivot transfers: Min assist       General transfer comment: min A to rise from chair and min A for balance     Balance Overall balance assessment: Needs assistance Sitting-balance support: Bilateral upper extremity supported;Feet supported Sitting balance-Leahy Scale: Fair     Standing balance support: Bilateral upper extremity supported Standing balance-Leahy Scale: Poor Standing balance comment: Pt needs UE support                   ADL Overall ADL's : Needs assistance/impaired                         Toilet Transfer: Minimal assistance;Ambulation;BSC;RW   Toileting- Clothing Manipulation and  Hygiene: Moderate assistance;Sit to/from stand       Functional mobility during ADLs: Minimal assistance;Rolling walker General ADL Comments: Pt with DOE 3/4, but sats remained >92% on 4L supplemental 02      Vision                     Perception     Praxis      Cognition   Behavior During Therapy: WFL for tasks assessed/performed;Flat affect Overall Cognitive Status: Within Functional Limits for tasks assessed                       Extremity/Trunk Assessment               Exercises     Shoulder Instructions       General Comments      Pertinent Vitals/ Pain       Pain Assessment: Faces Faces Pain Scale: Hurts little more Pain Location: low back  Pain Descriptors / Indicators: Aching Pain Intervention(s): Monitored during session;Repositioned  Home Living                                          Prior Functioning/Environment              Frequency  Min 2X/week        Progress Toward Goals  OT Goals(current goals can now be found  in the care plan section)  Progress towards OT goals: Progressing toward goals     Plan Discharge plan remains appropriate    Co-evaluation                 End of Session Equipment Utilized During Treatment: Rolling walker;Oxygen   Activity Tolerance Patient tolerated treatment well   Patient Left in bed;with call bell/phone within reach   Nurse Communication Mobility status        Time: [id number] OT Time Calculation (min): 19 min  Charges: OT General Charges $OT Visit: 1 Procedure OT Treatments $Therapeutic Activity: 8-22 mins  Rebekah Zackery M 09/23/2016, 3:54 PM

## 2016-09-23 NOTE — Progress Notes (Signed)
Inpatient Rehabilitation  Met with patient and spouse to discuss opening insurance to request re-authorization for IP Rehab.  Plan to follow along for timing of medical readiness, insurance authorization, and bed availability.  Please call with questions.   Carmelia Roller., CCC/SLP Admission Coordinator  [place]  Cell [phone]

## 2016-09-23 NOTE — Progress Notes (Signed)
PULMONARY / CRITICAL CARE MEDICINE   Name: Cheryl Wheeler MRN: [id number] DOB: 05-02-1966    ADMISSION DATE:  09/17/2016  REFERRING MD:  Dr. Posey Pronto   CHIEF COMPLAINT:  Dyspnea  BRIEF:  50 yo female presented to [facility] 08/22/16 with dyspnea, cough, fever, hypoxia, hyponatremia.  She remained on Bipap and transferred to [facility] for further therapy.  PMHx of COPD on home oxygen and Bipap, Chronic granulomatous disease, HTN, Bipolar, anxiety, GERD  SIGNIFICANT EVENTS: 11/12 - Admitted to [facility] where his acute on chronic COPD exacerbation and hypoosmolar hyponatremia 11/14 - Started on vancomycin and cefepime 11/16 - Transferred to [facility] ICU for possible tracheostomy due to continuous need for BiPAP and increased oxygen requirement.  11/17 - Intubated. CT chest negative for PE.  11/18 - Bronch RML, no DAH, some erthyema blood, BAL sent, tolerated 11/24 - extubated  11/26 - TRH primary  11/30 - Transfer to inpatient rehab   SUBJECTIVE:   Mild diuresis(475 cc) sitting in chair. Dyspnea with any activity but not desaturating. Suspect she can go to SDU and continue O2 as needed, diuresis as tolerated, and nocturnal NIMVS. Continue steroids at current dose for now.   VITAL SIGNS: BP (!) 150/72   Pulse 66   Temp 97.8 F (36.6 C) (Oral)   Resp 19   Ht 4' 11.5" (1.511 m)   Wt 178 lb 9.2 oz (81 kg)   SpO2 97%   BMI 35.46 kg/m   PHYSICAL EXAMINATION: General: adult female,in NAD, appears weak, dyspnea with any excertion Neuro: follows commands but sleepy. Unable to do flutter valve HEENT: On 5L [place]. MMM Cardiac: regular Chest: b/l crackles, decreased bs bases, kyphotic, Abd: soft, non tender Ext: 1+ edema Skin: no rashes  LABS:  BMET  Recent Labs Lab 09/21/16 0307 09/22/16 0235 09/23/16 0337  NA 140 136 134*  K 4.2 4.1 3.8  CL 91* 87* 83*  CO2 39* 39* 41*  BUN 5* 10 9  CREATININE 0.37* 0.50 0.40*  GLUCOSE 144* 169* 160*    Electrolytes  Recent  Labs Lab 09/17/16 2222  09/19/16 0307 09/20/16 0316 09/21/16 0307 09/22/16 0235 09/23/16 0337  CALCIUM 8.7*  < > 8.5* 8.7* 9.1 8.9 8.8*  MG 1.6*  --  1.9  --   --  1.6*  --   PHOS 3.8  --  4.4 3.7 4.3 4.0  --   < > = values in this interval not displayed.  CBC  Recent Labs Lab 09/21/16 0304 09/22/16 0235 09/23/16 0337  WBC 4.2 6.4 5.7  HGB 8.8* 9.1* 9.2*  HCT 30.3* 30.9* 30.9*  PLT 201 253 250    Coag's No results for input(s): APTT, INR in the last 168 hours.  Sepsis Markers  Recent Labs Lab 09/17/16 0533 09/17/16 2222  PROCALCITON <0.10 <0.10    ABG  Recent Labs Lab 09/19/16 0900  PHART 7.246*  PCO2ART 74.9*  PO2ART 65.8*    Liver Enzymes  Recent Labs Lab 09/20/16 0316 09/21/16 0307 09/22/16 0235  AST  --   --  14*  ALT  --   --  11*  ALKPHOS  --   --  39  BILITOT  --   --  0.4  ALBUMIN 2.2* 2.3* 2.4*    Cardiac Enzymes No results for input(s): TROPONINI, PROBNP in the last 168 hours.  Glucose  Recent Labs Lab 09/22/16 1150 09/22/16 1639 09/22/16 1944 09/23/16 0006 09/23/16 0401 09/23/16 0732  GLUCAP 360* 178* 269* 183* 167* 173*  Imaging Dg Chest Port 1 View  Result Date: 09/23/2016 CLINICAL DATA:  Respiratory failure, shortness of breath. EXAM: PORTABLE CHEST 1 VIEW COMPARISON:  Radiograph of September 21, 2016. FINDINGS: The heart size and mediastinal contours are within normal limits. No pneumothorax or pleural effusion is noted. Stable patchy opacities are noted in both upper lobes in left lung base concerning for pneumonia. The visualized skeletal structures are unremarkable. IMPRESSION: Stable bilateral lung opacities are noted concerning for pneumonia. Electronically Signed   By: James  Green Jr, M.D.   On: 09/23/2016 07:50     STUDIES:  Venous Duplex 11/16 >> negative for DVT  Bronch 11/18 >> RML, no DAH, some erythema blood rml  CT Chest 12/6 > consolidation in upper lobes and left lower lobe. Increase interval and  mediastinal lymph nodes, borderline enlarged, maybe reactive   ANTIBIOTICS: Rocephin 11/21 - 11/24 Bactrim BID 11/30 >> Cefepime 11/14 - 11/16; 12/ 4 >> 12/11 Vancomycin 11/14 - 11/16; 12/4 >> 12/11 Diflucan 09/15/2016>> 12/9 Voriconazole 12/9 >  SEROLOGIES: 08/2016 Alpha-1 antitrypsin: 120 ANA: Negative Rheumatoid factor: 10.6 C3: 103 C4: 8 ESR 12/4 >> 70 CRP 12/4>> 16.2 DS DNA 12/4>> neg Histone AB 12/4/>> neg Smith AB 12/4>> neg    Intake/Output Summary (Last 24 hours) at 09/23/16 0817 Last data filed at 09/23/16 0600  Gross per 24 hour  Intake             1600 ml  Output             2075 ml  Net             -475 ml   Assessment:  Acute hypoxic respiratory failure with b/l pulmonary infiltrates with Respiratory syncytial virus positive 08/28/16. Hx of COPD, chronic granulomatous disease.  Serology negative, cultures negative (abx d/c'd 12/11). - continue oxygen and Bipap - moderate fungus in sputum 12/05, speciation still pending >> continue voriconazole - continue albuterol, duonebs, solumedrol - lasix 60mg 12/12 with 500 +i/o, repeat lasix @80mg 12/13, place foley and document strict I&O's - goal negative fluid balance as tolerated. 12/14 repeat lasix. - CXR PRN  Hx of HTN, HLD. - labetalol, lipitor -12/13 prn apresoline and amlodipine 5 mg added on 12/14  Hx of bipolar, anxiety. - risperdal, remeron, valproic acid  Steroid induced hyperglycemia. CBG (last 3)   Recent Labs  09/23/16 0006 09/23/16 0401 09/23/16 0732  GLUCAP 183* 167* 173*   - lantus  Anemia of critical illness. - f/u CBC  Nutrition -  heart healthy / low sodium diet  Steve Minor ACNP [facility] PCCM Pager [phone] till 3 pm If no answer page [phone] 09/23/2016, 8:17 AM  Feels better. Still winded with activity.  Pleasant,  HR regular, better air movement.  CXR - decreased ASD  Assessment/plan:  Acute respiratory failure from RSV, and has fungal elements in sputum  cx. - clinically improved since started voriconazole - continue BDs, solumedrol for COPD/granulomatous disease - lasix - f/u CXR intermittently - oxygen, Bipap  Updated pt's husband at bedside  Transfer to SDU.  Will ask [facility] to assume primary care from 12/15 and PCCM follow as consult.   , MD  Pulmonary/Critical Care 09/23/2016, 10:08 AM Pager:  [phone] After 3pm call: [phone]  

## 2016-09-24 ENCOUNTER — Inpatient Hospital Stay (HOSPITAL_COMMUNITY): Payer: Medicare HMO

## 2016-09-24 DIAGNOSIS — D71 Functional disorders of polymorphonuclear neutrophils: Secondary | ICD-10-CM

## 2016-09-24 DIAGNOSIS — J9600 Acute respiratory failure, unspecified whether with hypoxia or hypercapnia: Secondary | ICD-10-CM

## 2016-09-24 LAB — CBC
HEMATOCRIT: 31.5 % — AB (ref 36.0–46.0)
HEMOGLOBIN: 9.5 g/dL — AB (ref 12.0–15.0)
MCH: 25.8 pg — AB (ref 26.0–34.0)
MCHC: 30.2 g/dL (ref 30.0–36.0)
MCV: 85.6 fL (ref 78.0–100.0)
Platelets: 254 10*3/uL (ref 150–400)
RBC: 3.68 MIL/uL — ABNORMAL LOW (ref 3.87–5.11)
RDW: 17.7 % — ABNORMAL HIGH (ref 11.5–15.5)
WBC: 5 10*3/uL (ref 4.0–10.5)

## 2016-09-24 LAB — BASIC METABOLIC PANEL
Anion gap: 7 (ref 5–15)
BUN: 8 mg/dL (ref 6–20)
CHLORIDE: 82 mmol/L — AB (ref 101–111)
CO2: 46 mmol/L — AB (ref 22–32)
CREATININE: 0.35 mg/dL — AB (ref 0.44–1.00)
Calcium: 9 mg/dL (ref 8.9–10.3)
GFR calc Af Amer: >60 mL/min (ref >60)
GFR calc non Af Amer: >60 mL/min (ref >60)
GLUCOSE: 191 mg/dL — AB (ref 65–99)
Potassium: 3.7 mmol/L (ref 3.5–5.1)
SODIUM: 135 mmol/L (ref 135–145)

## 2016-09-24 LAB — GLUCOSE, CAPILLARY
GLUCOSE-CAPILLARY: 200 mg/dL — AB (ref 65–99)
GLUCOSE-CAPILLARY: 262 mg/dL — AB (ref 65–99)
GLUCOSE-CAPILLARY: 278 mg/dL — AB (ref 65–99)
GLUCOSE-CAPILLARY: 389 mg/dL — AB (ref 65–99)
Glucose-Capillary: 184 mg/dL — ABNORMAL HIGH (ref 65–99)
Glucose-Capillary: 277 mg/dL — ABNORMAL HIGH (ref 65–99)

## 2016-09-24 LAB — MAGNESIUM: Magnesium: 1.9 mg/dL (ref 1.7–2.4)

## 2016-09-24 LAB — PHOSPHORUS: Phosphorus: 3.5 mg/dL (ref 2.5–4.6)

## 2016-09-24 MED ORDER — INSULIN ASPART 100 UNIT/ML ~~LOC~~ SOLN
0.0000 [IU] | Freq: Every day | SUBCUTANEOUS | Status: DC
  Administered 2016-09-24 – 2016-09-27 (×4): 5 [IU] via SUBCUTANEOUS

## 2016-09-24 MED ORDER — ENOXAPARIN SODIUM 40 MG/0.4ML ~~LOC~~ SOLN
40.0000 mg | SUBCUTANEOUS | Status: DC
  Administered 2016-09-24 – 2016-09-28 (×5): 40 mg via SUBCUTANEOUS
  Filled 2016-09-24 (×5): qty 0.4

## 2016-09-24 MED ORDER — SULFAMETHOXAZOLE-TRIMETHOPRIM 800-160 MG PO TABS
1.0000 | ORAL_TABLET | Freq: Two times a day (BID) | ORAL | Status: DC
  Administered 2016-09-24 – 2016-09-28 (×9): 1 via ORAL
  Filled 2016-09-24 (×11): qty 1

## 2016-09-24 MED ORDER — METHYLPREDNISOLONE SODIUM SUCC 125 MG IJ SOLR
40.0000 mg | Freq: Two times a day (BID) | INTRAMUSCULAR | Status: AC
  Administered 2016-09-24 – 2016-09-25 (×3): 40 mg via INTRAVENOUS
  Filled 2016-09-24 (×3): qty 2

## 2016-09-24 MED ORDER — INSULIN ASPART 100 UNIT/ML ~~LOC~~ SOLN
0.0000 [IU] | Freq: Three times a day (TID) | SUBCUTANEOUS | Status: DC
  Administered 2016-09-24: 2 [IU] via SUBCUTANEOUS
  Administered 2016-09-24: 11 [IU] via SUBCUTANEOUS
  Administered 2016-09-25: 7 [IU] via SUBCUTANEOUS
  Administered 2016-09-25: 4 [IU] via SUBCUTANEOUS
  Administered 2016-09-25 – 2016-09-26 (×2): 7 [IU] via SUBCUTANEOUS
  Administered 2016-09-26: 4 [IU] via SUBCUTANEOUS
  Administered 2016-09-26 – 2016-09-27 (×2): 11 [IU] via SUBCUTANEOUS
  Administered 2016-09-27 – 2016-09-28 (×2): 7 [IU] via SUBCUTANEOUS
  Administered 2016-09-28: 11 [IU] via SUBCUTANEOUS

## 2016-09-24 MED ORDER — IPRATROPIUM-ALBUTEROL 0.5-2.5 (3) MG/3ML IN SOLN
3.0000 mL | Freq: Three times a day (TID) | RESPIRATORY_TRACT | Status: DC
  Administered 2016-09-25 – 2016-09-28 (×10): 3 mL via RESPIRATORY_TRACT
  Filled 2016-09-24 (×10): qty 3

## 2016-09-24 NOTE — Progress Notes (Signed)
PCCM Progress Note   Admission date:  09/17/2016 Referring provider:  Dr. Suzan GaribaldiPatel CIR  CC:  Short of breath  HPI:  50 yo female [facility] with dyspnea, cough, fever, hypoxia, hyponatremia.  She remained on Bipap and [facility].  PMHx of COPD on home oxygen and Bipap, Chronic granulomatous disease ([facility]), HTN, Bipolar, anxiety, GERD  Subjective:    Still feels weak.  Breathing better.  Vital signs: BP (!) 147/59 (BP Location: Left Arm)   Pulse 73   Temp 98 F (36.7 C) (Oral)   Resp 14   Ht 4' 11.5" (1.511 m)   Wt 176 lb 5.9 oz (80 kg)   SpO2 97%   BMI 35.03 kg/m    Intake/output: I/O last 3 completed shifts: In: 1330 [P.O.:480; I.V.:350; Other:200; IV Piggyback:300] Out: 4270 [Urine:4270]  General: pleasant, sitting in chair Neuro: follows commands HEENT: no stridor Cardiac: regular, no murmur Chest: no wheeze Abd: soft, non tender Ext: 1+ edema Skin: no rashes   CMP Latest Ref Rng & Units 09/24/2016 09/23/2016 09/22/2016  Glucose 65 - 99 mg/dL 409(W191(H) 119(J160(H) 478(G169(H)  BUN 6 - 20 mg/dL 8 9 10   Creatinine 0.44 - 1.00 mg/dL 9.56(O0.35(L) 1.30(Q0.40(L) 6.570.50  Sodium 135 - 145 mmol/L 135 134(L) 136  Potassium 3.5 - 5.1 mmol/L 3.7 3.8 4.1  Chloride 101 - 111 mmol/L 82(L) 83(L) 87(L)  CO2 22 - 32 mmol/L 46(H) 41(H) 39(H)  Calcium 8.9 - 10.3 mg/dL 9.0 8.4(O8.8(L) 8.9  Total Protein 6.5 - 8.1 g/dL - - 5.6(L)  Total Bilirubin 0.3 - 1.2 mg/dL - - 0.4  Alkaline Phos 38 - 126 U/L - - 39  AST 15 - 41 U/L - - 14(L)  ALT 14 - 54 U/L - - 11(L)    CBC Latest Ref Rng & Units 09/24/2016 09/23/2016 09/22/2016  WBC 4.0 - 10.5 K/uL 5.0 5.7 6.4  Hemoglobin 12.0 - 15.0 g/dL 9.6(E9.5(L) 9.5(M9.2(L) 8.4(X9.1(L)  Hematocrit 36.0 - 46.0 % 31.5(L) 30.9(L) 30.9(L)  Platelets 150 - 400 K/uL 254 250 253    ABG    Component Value Date/Time   PHART 7.246 (L) 09/19/2016 0900   PCO2ART 74.9 (HH) 09/19/2016 0900   PO2ART 65.8 (L) 09/19/2016  0900   HCO3 31.4 (H) 09/19/2016 0900   TCO2 41 08/28/2016 0753   O2SAT 89.0 09/19/2016 0900   CBG (last 3)   Recent Labs  09/24/16 0002 09/24/16 0415 09/24/16 0932  GLUCAP 262* 184* 200*    Imaging: Dg Chest Port 1 View  Result Date: 09/24/2016 CLINICAL DATA:  Respiratory failure with shortness of breath EXAM: PORTABLE CHEST 1 VIEW COMPARISON:  September 23, 2016 FINDINGS: Patchy areas of opacity in the left lung, most notably in the left upper left mid lung regions, persist without appreciable change. There is mild increased opacity in the right upper lobe peripherally. No new areas of opacity are evident. Heart size and pulmonary vascularity are normal. No adenopathy. No bone lesions. IMPRESSION: Persistent patchy opacity bilaterally, more on the left than on the right. Suspect multifocal pneumonia. No appreciable change from 1 day prior. Followup PA and lateral chest radiographs recommended in 3-4 weeks following trial of antibiotic therapy to ensure resolution and exclude underlying malignancy. Electronically Signed   By: Bretta BangWilliam  Woodruff III M.D.   On: 09/24/2016 07:32   Dg Chest Port 1 View  Result Date: 09/23/2016 CLINICAL DATA:  Respiratory failure, shortness of breath. EXAM: PORTABLE CHEST 1  VIEW COMPARISON:  Radiograph of September 21, 2016. FINDINGS: The heart size and mediastinal contours are within normal limits. No pneumothorax or pleural effusion is noted. Stable patchy opacities are noted in both upper lobes in left lung base concerning for pneumonia. The visualized skeletal structures are unremarkable. IMPRESSION: Stable bilateral lung opacities are noted concerning for pneumonia. Electronically Signed   By: Lupita RaiderJames  Green Jr, M.D.   On: 09/23/2016 07:50     Studies:  Venous Duplex 11/16 >> negative for DVT  Bronch 11/18 >> RML, no DAH, some erythema blood rml  CT Chest 12/6 > consolidation in upper lobes and left lower lobe. Increase interval and mediastinal lymph nodes,  borderline enlarged, maybe reactive   Antibiotics: Voriconazole 12/9 >  Cultures: Sputum 12/05 >> moderate fungus >>   Serologies: 08/2016 >> A1AT 120, ANA negative, RF 10.6, anti DS DNA negative, Histone Ab negative, anti Smith Ab negative  Events: 11/12 [facility]25 To SDU 11/30 To CIR 12/08 To ICU with hyoxia, add Vfend 12/14 Transfer to SDU  Lines/tubes: ETT 11/16 >> 11/24  Assessment:  Acute hypoxic respiratory failure with b/l pulmonary infiltrates with Respiratory syncytial virus positive 08/28/16, and recurrent fungal infection. Hx of COPD, chronic granulomatous disease. Presumed OSA. - f/u CXR intermittently - oxygen to keep SpO2 90 to 95% - Bipap qhs and prn - Continue voriconazole - f/u sputum culture from 12/05 - needs to be on life long bactrim and antifungal therapy in setting of CGD - scheduled BDs - wean off solumedrol - goal even to negative fluid balance  Hx of HTN, HLD. - labetalol, lipitor, norvasc  Hx of bipolar, anxiety. - remeron, valproic acid  Steroid induced hyperglycemia. - SSI with lantus  Anemia of critical illness. - f/u CBC  DVT prophylaxis - Lovenox SUP - protonix Nutrition - Carb modified, heart healthy Goals of care - full code  PCCM will f/u on Monday, 09/27/16 >> call if help needed sooner  Coralyn HellingVineet Swanson Farnell, MD [facility] Pulmonary/Critical Care 09/24/2016, 10:24 AM Pager:  [phone] After 3pm call: [phone]

## 2016-09-24 NOTE — Progress Notes (Addendum)
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  She remained on Bipap and transferred to [facility] for further therapy.11/17 - Intubated.11/24 - extubated . [facility] assumed care 12/15    Assessment/Plan: Acute hypoxic respiratory failure with b/l pulmonary infiltrates with Respiratory syncytial virus positive 08/28/16. Hx of COPD, chronic granulomatous disease.  Serology negative, cultures negative (abx d/c'd 12/11). - continue oxygen and Bipap - moderate fungus in sputum 12/05, speciation still pending >> continue voriconazole - continue albuterol, duonebs, solumedrol iv  - lasix 65m 12/12 , repeat lasix 894m12/13, continue foley if intermittent lasix needed  - CXR PRN needs to be on life long bactrim and antifungal therapy in setting of CGD   Hx of HTN, HLD. - labetalol , prn hydralazine , Increase norvasc   HLD  lipitor    Hx of bipolar, anxiety. - risperdal, remeron, valproic acid  Steroid induced hyperglycemia. CBG (last 3)   Recent Labs (last 2 labs)    Recent Labs  09/23/16 0006 09/23/16 0401 09/23/16 0732  GLUCAP 183* 167* 173*     - lantus   Anemia of critical illness. - f/u CBC  Nutrition -  heart healthy / low sodium diet     DVT prophylaxsis  Scd, lovenox   Code Status:  Full code     Family Communication: Discussed in  detail with the patient, all imaging results, lab results explained to the patient   Disposition Plan:  CIR     SIGNIFICANT EVENTS: 11/12 - [facility] COPD exacerbation and hypoosmolar hyponatremia 11/14 - Started on vancomycin and cefepime 11/16 - [facility] due to continuous need for BiPAP and increased oxygen requirement.  11/17 - Intubated. CT chest negative for PE.  11/18 - Bronch RML, no DAH, some erthyema blood, BAL sent, tolerated 11/24 - extubated  11/26 - [facility] primary  11/30 - Transfer to inpatient rehab  Venous Duplex 11/16 >> negative for DVT  Bronch 11/18 >> RML, no DAH, some erythema blood rml  CT Chest 12/6 > consolidation in upper lobes and left lower lobe. Increase interval and mediastinal lymph nodes, borderline enlarged, maybe reactive   ANTIBIOTICS: Rocephin 11/21 - 11/24 Bactrim BID 11/30 >> Cefepime 11/14 - 11/16; 12/ 4 >> 12/11 Vancomycin 11/14 - 11/16; 12/4 >> 12/11 Diflucan 09/15/2016>> 12/9 Voriconazole 12/9 >  SEROLOGIES: 08/2016 Alpha-1 antitrypsin: 120 ANA: Negative Rheumatoid factor: 10.6 C3: 103 C4: 8 ESR 12/4 >> 70 CRP 12/4>> 16.2 DS DNA 12/4>> neg Histone AB 12/4/>> neg Smith AB 12/4>> neg   Antibiotics:      HPI/Subjective: Sitting in the chair, on 5L Has a foley would like to keep it   Objective: Vitals:   09/24/16 0827 09/24/16 0933 09/24/16 1038 09/24/16 1131  BP:   (!) 185/76   Pulse:      Resp:      Temp:  98 F (36.7  C)  98.2 F (36.8 C)  TempSrc:  Oral  Oral  SpO2: 97%     Weight:      Height:        Intake/Output Summary (Last 24 hours) at 09/24/16 1234 Last data filed at 09/24/16 0600  Gross per 24 hour  Intake              745 ml  Output             1950 ml  Net            -1205 ml    Exam:  General: adult female,in NAD, appears weak, dyspnea with any excertion Neuro: follows commands but sleepy. Unable to do  flutter valve HEENT: On 5L [place]. MMM Cardiac: regular Chest: b/l crackles, decreased bs bases, kyphotic, Abd: soft, non tender Ext: 1+ edema Skin: no rashes    Data Reviewed: I have personally reviewed following labs and imaging studies  Micro Results Recent Results (from the past 240 hour(s))  Culture, respiratory (NON-Expectorated)     Status: None (Preliminary result)   Collection Time: 09/14/16  2:20 PM  Result Value Ref Range Status   Specimen Description TRACHEAL ASPIRATE  Final   Special Requests REC'D TASP  Final   Gram Stain   Final    FEW WBC PRESENT, PREDOMINANTLY PMN RARE GRAM POSITIVE RODS RARE GRAM POSITIVE COCCI    Culture   Final    MODERATE FUNGUS ISOLATED SENT TO STATE FOR IDENTIFICATION    Report Status PENDING  Incomplete  Culture, Urine     Status: None   Collection Time: 09/14/16  5:38 PM  Result Value Ref Range Status   Specimen Description URINE, RANDOM  Final   Special Requests NONE  Final   Culture NO GROWTH  Final   Report Status 09/15/2016 FINAL  Final    Radiology Reports Dg Chest 2 View  Result Date: 09/13/2016 CLINICAL DATA:  Chest pain EXAM: CHEST  2 VIEW COMPARISON:  09/09/2016 FINDINGS: Multifocal patchy opacities in the bilateral upper lobes and left lower lobe, suspicious for pneumonia. No pleural effusion or pneumothorax. The heart is top-normal in size. Visualized osseous structures are within normal limits. IMPRESSION: Multifocal patchy opacities in the bilateral upper lobes and left lower lobe, suspicious for pneumonia. Electronically Signed   By: Julian Hy M.D.   On: 09/13/2016 11:46   Ct Chest Wo Contrast  Result Date: 09/14/2016 CLINICAL DATA:  Acute on chronic respiratory failure. EXAM: CT CHEST WITHOUT CONTRAST TECHNIQUE: Multidetector CT imaging of the chest was performed following the standard protocol without IV contrast. COMPARISON:  08/27/2016.  06/22/2016 FINDINGS: Cardiovascular: The heart size is normal. No  pericardial effusion. There is abdominal aortic atherosclerosis without aneurysm. Mediastinum/Nodes: Scattered mediastinal lymph nodes again noted, slightly increased in the interval. 7 mm short axis prevascular lymph node today was 6 mm previously. 8 mm short axis precarinal lymph node was not well seen on prior study. 12 mm short axis subcarinal lymph node is not substantially changed. Hilar regions not well evaluated without intravenous contrast material. The esophagus has normal imaging features. There is no axillary lymphadenopathy. Lungs/Pleura: Interval development of multiple patchy areas of confluent airspace consolidation in both upper lobes in the left lower lobe. In the posterior left lower lobe, there is also new micro nodularity in a peribronchovascular distribution, just inferior to the confluent airspace disease. Changes of underlying emphysema again noted with bronchial wall thickening. A degree of mosaic ground-glass attenuation is seen  in the lung apices, as before. Upper Abdomen: Unremarkable. Musculoskeletal: Bone windows reveal no worrisome lytic or sclerotic osseous lesions. IMPRESSION: 1. Interval development of peripheral areas of confluent airspace consolidation in the upper lobes and left lower lobe. Differential considerations would include multifocal pneumonia, cryptogenic pneumonia and inflammatory etiology. Pattern of airspace disease is not as round and nodular as typically seen for septic emboli. 2. Interval increase and mediastinal lymph nodes, now borderline enlarged. These may be reactive. Electronically Signed   By: Misty Stanley M.D.   On: 09/14/2016 20:21   Ct Angio Chest Pe W Or Wo Contrast  Result Date: 08/27/2016 CLINICAL DATA:  Increasing dyspnea, evaluate for PE EXAM: CT ANGIOGRAPHY CHEST WITH CONTRAST TECHNIQUE: Multidetector CT imaging of the chest was performed using the standard protocol during bolus administration of intravenous contrast. Multiplanar CT image  reconstructions and MIPs were obtained to evaluate the vascular anatomy. CONTRAST:  100 mL Isovue 300 IV COMPARISON:  CT chest dated 06/22/2016 FINDINGS: Cardiovascular: Satisfactory opacification of the bilateral pulmonary arteries to the segmental level. No evidence of pulmonary embolism. No evidence of thoracic aortic aneurysm or dissection. Very mild atherosclerotic calcifications of the aortic root and arch. The heart is normal size.  No pericardial effusion. Mediastinum/Nodes: No suspicious mediastinal lymphadenopathy. Visualized thyroid is grossly unremarkable. Lungs/Pleura: Evaluation of the lung parenchyma is constrained by respiratory motion. Endotracheal tube terminates 2.0 cm above the carina. Mild patchy/ground-glass opacities in the lateral right middle lobe (series 6/ image 71), suspicious for pneumonia. Patchy/platelike opacity in the left upper lobe/ lingula (series 6/ image 81), similar to the prior, favored to reflect scarring. Mild linear scarring/atelectasis in the medial right upper lobe. Calcified granulomata in the left upper lobe (series 6/ image 82) and left lower lobe (series 6/image 93), benign. No suspicious pulmonary nodules. Underlying mild emphysematous changes. No pleural effusion or pneumothorax. Upper Abdomen: Visualized upper abdomen is notable for an enteric tube coursing into the distal gastric body. Musculoskeletal: Degenerative changes of the visualized thoracolumbar spine. Review of the MIP images confirms the above findings. IMPRESSION: No evidence of pulmonary embolism. Mild patchy/ground-glass opacities in the lateral right middle lobe, suspicious for pneumonia. Endotracheal tube terminates 2.0 cm above the carina. Additional ancillary findings as above. Electronically Signed   By: Julian Hy M.D.   On: 08/27/2016 15:06   Dg Chest Port 1 View  Result Date: 09/24/2016 CLINICAL DATA:  Respiratory failure with shortness of breath EXAM: PORTABLE CHEST 1 VIEW  COMPARISON:  September 23, 2016 FINDINGS: Patchy areas of opacity in the left lung, most notably in the left upper left mid lung regions, persist without appreciable change. There is mild increased opacity in the right upper lobe peripherally. No new areas of opacity are evident. Heart size and pulmonary vascularity are normal. No adenopathy. No bone lesions. IMPRESSION: Persistent patchy opacity bilaterally, more on the left than on the right. Suspect multifocal pneumonia. No appreciable change from 1 day prior. Followup PA and lateral chest radiographs recommended in 3-4 weeks following trial of antibiotic therapy to ensure resolution and exclude underlying malignancy. Electronically Signed   By: Lowella Grip III M.D.   On: 09/24/2016 07:32   Dg Chest Port 1 View  Result Date: 09/23/2016 CLINICAL DATA:  Respiratory failure, shortness of breath. EXAM: PORTABLE CHEST 1 VIEW COMPARISON:  Radiograph of September 21, 2016. FINDINGS: The heart size and mediastinal contours are within normal limits. No pneumothorax or pleural effusion is noted. Stable patchy opacities are noted in both upper lobes  in left lung base concerning for pneumonia. The visualized skeletal structures are unremarkable. IMPRESSION: Stable bilateral lung opacities are noted concerning for pneumonia. Electronically Signed   By: Marijo Conception, M.D.   On: 09/23/2016 07:50   Dg Chest Port 1 View  Result Date: 09/21/2016 CLINICAL DATA:  Shortness of breath, respiratory failure EXAM: PORTABLE CHEST 1 VIEW COMPARISON:  09/19/2016 FINDINGS: Heart is normal size. Patchy bilateral airspace opacities are again noted, best seen in the right upper lobe, left upper lobe and left base. No real change. No visible significant effusions. No acute bony abnormality. IMPRESSION: Stable patchy bilateral airspace disease. Electronically Signed   By: Rolm Baptise M.D.   On: 09/21/2016 08:09   Dg Chest Port 1 View  Result Date: 09/19/2016 CLINICAL DATA:   Respiratory failure. EXAM: PORTABLE CHEST 1 VIEW COMPARISON:  September 17, 2016 FINDINGS: The focal infiltrate in the left upper lobe on the previous study has improved. Infiltrate in the right upper lobe is stable. Opacity in the left base is similar to mildly worsened. No other interval changes. IMPRESSION: Multifocal infiltrates, improved in the left upper lobe, stable in the right upper lobe, and mildly worsened in the left base. Electronically Signed   By: Dorise Bullion III M.D   On: 09/19/2016 09:21   Dg Chest Port 1 View  Result Date: 09/17/2016 CLINICAL DATA:  Respiratory distress. EXAM: PORTABLE CHEST 1 VIEW COMPARISON:  Radiographs and CT 09/14/2016 FINDINGS: Airspace opacities in both upper lobes and periphery of the left lower lobe, without significant change from prior exam. No new focal abnormality. Stable heart size and mediastinal contours. No pulmonary edema or evident pleural effusion. IMPRESSION: Unchanged bilateral airspace opacities from recent exams. No new abnormality. Electronically Signed   By: Jeb Levering M.D.   On: 09/17/2016 21:27   Dg Chest Port 1 View  Result Date: 09/14/2016 CLINICAL DATA:  Increasing dyspnea this morning EXAM: PORTABLE CHEST 1 VIEW COMPARISON:  09/13/2016 FINDINGS: Multifocal patchy opacities persist bilaterally without significant interval change. No large effusion. Normal pulmonary vasculature. Hilar and mediastinal contours are unremarkable and unchanged. IMPRESSION: Multifocal airspace opacities persist bilaterally without significant interval change. Electronically Signed   By: Andreas Newport M.D.   On: 09/14/2016 06:04   Dg Chest Port 1 View  Result Date: 09/09/2016 CLINICAL DATA:  Shortness of breath, hypertension, COPD, GERD, diabetes mellitus EXAM: PORTABLE CHEST 1 VIEW COMPARISON:  Portable exam 0816 hours compared 09/03/2016 FINDINGS: Interval removal of endotracheal and nasogastric tubes. Normal heart size mediastinal contours.  Bronchitic changes with hazy LEFT upper lobe infiltrate suspicious for pneumonia. Question minimal RIGHT upper lobe infiltrate as well. Remaining lungs grossly clear. No pleural effusion or pneumothorax. Bones unremarkable. IMPRESSION: Bronchitic changes with probable mild BILATERAL upper lobe infiltrates question pneumonia. Electronically Signed   By: Lavonia Dana M.D.   On: 09/09/2016 08:28   Dg Chest Port 1 View  Result Date: 09/03/2016 CLINICAL DATA:  Intubation. EXAM: PORTABLE CHEST 1 VIEW COMPARISON:  09/02/2016. FINDINGS: Endotracheal tube tip 2 cm above the carina. NG tube tip noted below left hemidiaphragm. Mild left lower lobe infiltrate. Calcified right upper lobe pulmonary nodule noted consistent granuloma. No pleural effusion or pneumothorax. IMPRESSION: 1. Endotracheal tube tip 2 cm above the carina. NG tube tip below left hemidiaphragm. 2. Mild left lower lobe infiltrate. Electronically Signed   By: Marcello Moores  Register   On: 09/03/2016 06:50   Dg Chest Port 1 View  Result Date: 09/02/2016 CLINICAL DATA:  Ventilator dependence. EXAM:  PORTABLE CHEST 1 VIEW COMPARISON:  09/01/2016 and 08/27/2016 FINDINGS: Endotracheal tube has tip 3.6 cm above the carina. Nasogastric tube courses into the region of the stomach and off the inferior portion of the film as tip is not visualized. Lungs are adequately inflated mild stable prominence of the vascular markings in the left infrahilar region. No focal lobar consolidation, effusion or pneumothorax. Small stable calcified granulomas right lung. Cardiomediastinal silhouette and remainder of the exam is unchanged. IMPRESSION: Mild stable prominence of the left infrahilar vascular markings. Tubes and lines as described. Electronically Signed   By: Marin Olp M.D.   On: 09/02/2016 07:10   Dg Chest Port 1 View  Result Date: 09/01/2016 CLINICAL DATA:  Hypoxia EXAM: PORTABLE CHEST 1 VIEW COMPARISON:  August 31, 2016 FINDINGS: Endotracheal tube tip is 2.4 cm  above the carina. Nasogastric tube tip and side port are below the diaphragm. No pneumothorax. There is generalized interstitial thickening. No airspace consolidation. Heart is upper normal in size with mild pulmonary venous hypertension. No adenopathy. No bone lesions. IMPRESSION: Tube positions as described without pneumothorax. There is a degree of pulmonary vascular congestion. Interstitial prominence may reflect mild underlying pulmonary edema but also may be reflective of chronic inflammatory type change. No airspace consolidation or adenopathy. Electronically Signed   By: Lowella Grip III M.D.   On: 09/01/2016 09:40   Dg Chest Port 1 View  Result Date: 08/31/2016 CLINICAL DATA:  50 year old female with a history of acute respiratory failure and shortness of breath EXAM: PORTABLE CHEST 1 VIEW COMPARISON:  08/30/2016, 08/28/2016, CT chest 08/27/2016 FINDINGS: Cardiomediastinal silhouette unchanged in size and contour. No evidence of central vascular congestion. Similar appearance of coarsened interstitial markings with mild reticular pattern opacity bilaterally. No confluent airspace disease or pneumothorax. Unchanged position of the endotracheal tube which terminates approximately 3.2 cm above the carina. Unchanged gastric tube terminating out of the field of view. IMPRESSION: Similar appearance of the chest x-ray with coarsened interstitial markings, potentially representing atelectasis/scarring and/ or persisting airspace disease which was present on the prior CT. Unchanged endotracheal tube and gastric tube Signed, Dulcy Fanny. Earleen Newport, DO [facility] Electronically Signed   By: Corrie Mckusick D.O.   On: 08/31/2016 07:33   Dg Chest Port 1 View  Result Date: 08/30/2016 CLINICAL DATA:  COPD, pneumonia, acute respiratory failure. EXAM: PORTABLE CHEST 1 VIEW COMPARISON:  Portable chest x-ray of August 28, 2016 FINDINGS: The lungs are  hyperinflated. The interstitial markings are coarse in the left lower lung field. The heart is normal in size. The pulmonary vascularity is not engorged. The mediastinum is normal in width. The endotracheal tube tip lies 2.7 cm above the carina. The esophagogastric tube tip projects below the inferior margin of the image. IMPRESSION: COPD. Left lower lobe subsegmental atelectasis or mild pneumonia. No CHF. No significant change since yesterday's study. Electronically Signed   By: David  Martinique M.D.   On: 08/30/2016 07:11   Dg Chest Port 1 View  Result Date: 08/28/2016 CLINICAL DATA:  Acute respiratory failure EXAM: PORTABLE CHEST 1 VIEW COMPARISON:  08/27/2016 FINDINGS: Endotracheal tube and NG tube remain in place, unchanged. There is hyperinflation of the lungs compatible with COPD. Heart borderline in size. Improving left basilar opacity. Right lung is clear. No effusions. IMPRESSION: COPD.  Improving left base atelectasis or infiltrate. Electronically Signed   By: Rolm Baptise M.D.   On: 08/28/2016 07:26   Portable Chest Xray  Result Date: 08/27/2016 CLINICAL DATA:  Respiratory failure, intubated patient; history of COPD EXAM: PORTABLE CHEST 1 VIEW COMPARISON:  Portable chest x-ray of August 27, 2016 12:11 a.m. FINDINGS: The patient is rotated toward the right on this study. The lungs are well-expanded. There is persistent infiltrate or atelectasis at the left lung base. There is no pleural effusion or pneumothorax. The heart and pulmonary vascularity are normal. The endotracheal tube tip lies 2.5 cm above the carina. The esophagogastric tube tip projects below the inferior margin of the image. The observed bony thorax is unremarkable. IMPRESSION: COPD. Left lower lobe atelectasis or pneumonia. The support tubes are in stable position. Electronically Signed   By: David  Martinique M.D.   On: 08/27/2016 09:36   Portable Chest Xray  Result Date: 08/27/2016 CLINICAL DATA:  Respiratory failure EXAM:  PORTABLE CHEST 1 VIEW COMPARISON:  08/26/2016 FINDINGS: Endotracheal tube tip is 2.2 cm above the carina. Nasogastric tube extends into the stomach and beyond the inferior edge of the image. The lungs are clear. No pneumothorax. No large effusion. Normal pulmonary vasculature. Unremarkable hilar and mediastinal contours. IMPRESSION: 1.  Support equipment appears satisfactorily positioned. 2. No consolidation or effusion.  No pneumothorax. Electronically Signed   By: Andreas Newport M.D.   On: 08/27/2016 00:19   Dg Chest Port 1 View  Result Date: 08/26/2016 CLINICAL DATA:  Dyspnea. EXAM: PORTABLE CHEST 1 VIEW COMPARISON:  08/25/2016. FINDINGS: Normal sized heart. Stable linear scar in the right upper lung zone and left perihilar region. Otherwise, clear lungs with normal vascularity. No pleural fluid. Unremarkable bones. IMPRESSION: No acute abnormality. Electronically Signed   By: Claudie Revering M.D.   On: 08/26/2016 18:44   Dg Abd Portable 1v  Result Date: 08/27/2016 CLINICAL DATA:  Orogastric tube placement EXAM: PORTABLE ABDOMEN - 1 VIEW COMPARISON:  07/21/2007 FINDINGS: The orogastric tube extends well into the stomach with tip in the region of the distal gastric body. IMPRESSION: Enteric tube reaches well into the stomach. Electronically Signed   By: Andreas Newport M.D.   On: 08/27/2016 00:21     CBC  Recent Labs Lab 09/19/16 0307 09/20/16 0316 09/21/16 0304 09/22/16 0235 09/23/16 0337 09/24/16 0333  WBC 3.6* 3.2* 4.2 6.4 5.7 5.0  HGB 8.6* 8.6* 8.8* 9.1* 9.2* 9.5*  HCT 28.6* 28.8* 30.3* 30.9* 30.9* 31.5*  PLT 173 186 201 253 250 254  MCV 85.9 86.5 87.6 87.0 85.6 85.6  MCH 25.8* 25.8* 25.4* 25.6* 25.5* 25.8*  MCHC 30.1 29.9* 29.0* 29.4* 29.8* 30.2  RDW 19.2* 18.8* 18.8* 18.3* 17.8* 17.7*  LYMPHSABS 1.0 0.6* 0.6*  --   --   --   MONOABS 0.5 0.1 0.2  --   --   --   EOSABS 0.1 0.0 0.0  --   --   --   BASOSABS 0.0 0.0 0.0  --   --   --     Chemistries   Recent Labs Lab  09/17/16 2222  09/19/16 0307 09/20/16 0316 09/21/16 0307 09/22/16 0235 09/23/16 0337 09/24/16 0333  NA 137  < > 134* 137 140 136 134* 135  K 2.8*  < > 4.3 4.8 4.2 4.1 3.8 3.7  CL 90*  < > 91* 94* 91* 87* 83* 82*  CO2 38*  < > 38* 31 39* 39* 41* 46*  GLUCOSE 113*  < > 136* 132* 144* 169* 160* 191*  BUN 7  < > <5* <5* 5* _0 CREATININE 0.43*  < > 0.48 0.32* 0.37* 0.50 0.40* 0.35*  CALCIUM 8.7*  < > 8.5* 8.7* 9.1 8.9 8.8* 9.0  MG 1.6*  --  1.9  --   --  1.6*  --  1.9  AST  --   --   --   --   --  14*  --   --   ALT  --   --   --   --   --  11*  --   --   ALKPHOS  --   --   --   --   --  39  --   --   BILITOT  --   --   --   --   --  0.4  --   --   < > = values in this interval not displayed. ------------------------------------------------------------------------------------------------------------------ estimated creatinine clearance is 77 mL/min (by C-G formula based on SCr of 0.35 mg/dL (L)). ------------------------------------------------------------------------------------------------------------------ No results for input(s): HGBA1C in the last 72 hours. ------------------------------------------------------------------------------------------------------------------ No results for input(s): CHOL, HDL, LDLCALC, TRIG, CHOLHDL, LDLDIRECT in the last 72 hours. ------------------------------------------------------------------------------------------------------------------ No results for input(s): TSH, T4TOTAL, T3FREE, THYROIDAB in the last 72 hours.  Invalid input(s): FREET3 ------------------------------------------------------------------------------------------------------------------ No results for input(s): VITAMINB12, FOLATE, FERRITIN, TIBC, IRON, RETICCTPCT in the last 72 hours.  Coagulation profile No results for input(s): INR, PROTIME in the last 168 hours.  No results for input(s): DDIMER in the last 72 hours.  Cardiac Enzymes No results for input(s): CKMB,  TROPONINI, MYOGLOBIN in the last 168 hours.  Invalid input(s): CK ------------------------------------------------------------------------------------------------------------------ Invalid input(s): POCBNP   CBG:  Recent Labs Lab 09/23/16 1932 09/24/16 0002 09/24/16 0415 09/24/16 0932 09/24/16 1130  GLUCAP 193* 262* 184* 200* 277*       Studies: Dg Chest Port 1 View  Result Date: 09/24/2016 CLINICAL DATA:  Respiratory failure with shortness of breath EXAM: PORTABLE CHEST 1 VIEW COMPARISON:  September 23, 2016 FINDINGS: Patchy areas of opacity in the left lung, most notably in the left upper left mid lung regions, persist without appreciable change. There is mild increased opacity in the right upper lobe peripherally. No new areas of opacity are evident. Heart size and pulmonary vascularity are normal. No adenopathy. No bone lesions. IMPRESSION: Persistent patchy opacity bilaterally, more on the left than on the right. Suspect multifocal pneumonia. No appreciable change from 1 day prior. Followup PA and lateral chest radiographs recommended in 3-4 weeks following trial of antibiotic therapy to ensure resolution and exclude underlying malignancy. Electronically Signed   By: Lowella Grip III M.D.   On: 09/24/2016 07:32   Dg Chest Port 1 View  Result Date: 09/23/2016 CLINICAL DATA:  Respiratory failure, shortness of breath. EXAM: PORTABLE CHEST 1 VIEW COMPARISON:  Radiograph of September 21, 2016. FINDINGS: The heart size and mediastinal contours are within normal limits. No pneumothorax or pleural effusion is noted. Stable patchy opacities are noted in both upper lobes in left lung base concerning for pneumonia. The visualized skeletal structures are unremarkable. IMPRESSION: Stable bilateral lung opacities are noted concerning for pneumonia. Electronically Signed   By: Marijo Conception, M.D.   On: 09/23/2016 07:50      Lab Results  Component Value Date   HGBA1C 6.5 (H) 08/29/2016    Lab Results  Component Value Date   CREATININE 0.35 (L) 09/24/2016       Scheduled Meds: . amLODipine  5 mg Oral Daily  . atorvastatin  10 mg Oral Daily  . chlorhexidine  15 mL Mouth Rinse BID  . feeding supplement  1  Container Oral TID BM  . Gerhardt's butt cream   Topical TID  . insulin aspart  0-20 Units Subcutaneous TID WC  . insulin aspart  0-5 Units Subcutaneous QHS  . insulin glargine  5 Units Subcutaneous Q24H  . ipratropium-albuterol  3 mL Nebulization Q6H  . labetalol  300 mg Oral TID  . mouth rinse  15 mL Mouth Rinse q12n4p  . methylPREDNISolone (SOLU-MEDROL) injection  40 mg Intravenous Q12H  . mirtazapine  45 mg Oral QHS  . pantoprazole  40 mg Oral Daily  . sulfamethoxazole-trimethoprim  1 tablet Oral Q12H  . valproic acid  500 mg Oral BID  . voriconazole  250 mg Intravenous Q12H   Continuous Infusions:   LOS: 7 days    Time spent: >30 MINS    [facility]  [facility] Pager [phone]. If 7PM-7AM, please contact night-coverage at [email or web address], password [facility] 09/24/2016, 12:34 PM  LOS: 7 days

## 2016-09-24 NOTE — Progress Notes (Signed)
Physical Therapy Treatment Patient Details Name: Cheryl NissenJonce Ann Wheeler MRN: [id number] DOB: 08-07-1966 Today's Date: 09/24/2016    History of Present Illness Pateint is a 50 year old female admitted for acute respiratory failure with hypoxemia on 08/18/16 from CIR. Patient intubated 08/27/16, extubated 09/03/16. CIR 09/09/16-09/17/16. PMH includes advanced COPD with 3LO2 nasal cannula use at home, chronic granulomatous disease, HTN, bipolar disorder, and anxiety.    PT Comments    Patient was tired upon arrival, but willing to ambulate. She doubled her distance walked today versus 09/22/16. Patient had 3 bouts of dyspnea during treatment today, and significant recovery times were needed to resume mobility. She seems eager to progress mobility, and noted that she would have liked to walk further. Will continue to follow.   O2 saturation on 4 L to start, followed by 6 L midambulation for recovery. Sats maintained >90% during session, with one bout of 87% recovered with deep breathing.   Follow Up Recommendations  CIR     Equipment Recommendations       Recommendations for Other Services       Precautions / Restrictions Precautions Precautions: Fall Precaution Comments: watch sats    Mobility  Bed Mobility Overal bed mobility: Needs Assistance Bed Mobility: Sit to Supine       Sit to supine: Mod assist   General bed mobility comments: in chair on arrival, with return to bed required assist to bring bil LE onto surface with cues  Transfers Overall transfer level: Needs assistance   Transfers: Sit to/from Stand Sit to Stand: Min assist Stand pivot transfers: Min guard       General transfer comment: minguard to stand x 3 from chair with cues for hand placement, min assist to sit due to lack of control and reaching for surface despite cues, minguard to pivot chair to bed end of session  Ambulation/Gait Ambulation/Gait assistance: Min assist;+2 safety/equipment Ambulation  Distance (Feet): 15 Feet Assistive device: Rolling walker (2 wheeled) Gait Pattern/deviations: Step-through pattern;Decreased stride length;Trunk flexed   Gait velocity interpretation: Below normal speed for age/gender General Gait Details: min assist for balance, cues for sequence with use of RW, chair to follow for safety. Pt walked 15', 5 min seated rest, 7', 3 min seated rest, 10'   Stairs            Wheelchair Mobility    Modified Rankin (Stroke Patients Only)       Balance Overall balance assessment: Needs assistance   Sitting balance-Leahy Scale: Good       Standing balance-Leahy Scale: Poor                      Cognition Arousal/Alertness: Awake/alert Behavior During Therapy: WFL for tasks assessed/performed Overall Cognitive Status: Within Functional Limits for tasks assessed                      Exercises      General Comments        Pertinent Vitals/Pain Pain Score: 6  Pain Location: low back  Pain Descriptors / Indicators: Aching Pain Intervention(s): Limited activity within patient's tolerance;Monitored during session;Repositioned    Home Living                      Prior Function            PT Goals (current goals can now be found in the care plan section) Progress towards PT goals: Progressing toward goals  Frequency           PT Plan Current plan remains appropriate    Co-evaluation             End of Session Equipment Utilized During Treatment: Gait belt;Oxygen Activity Tolerance: Patient tolerated treatment well Patient left: in bed;with call bell/phone within reach;with bed alarm set     Time: [id number] PT Time Calculation (min) (ACUTE ONLY): 29 min  Charges:  $Gait Training: 8-22 mins $Therapeutic Activity: 8-22 mins                    G Codes:      Rosemary Pentecost 09/24/2016, 2:32 PM  Terrall Bley SPT [phone]

## 2016-09-24 NOTE — Progress Notes (Signed)
Inpatient Rehabilitation  Patient not medically ready for IP Rehab today.  My co-worker, Weldon PickingSusan Blankenship will follow up Monday for medical readiness, and insurance authorization.  Hope to re-admit patient for completion of program in order to maximize her functional abilities prior to discharge home with spouse.     Charlane FerrettiMelissa Tamela Elsayed, M.A., CCC/SLP Admission Coordinator  [facility]  Cell [phone]

## 2016-09-24 NOTE — Progress Notes (Signed)
Inpatient Diabetes Program Recommendations  AACE/ADA: New Consensus Statement on Inpatient Glycemic Control (2015)  Target Ranges:  Prepandial:   less than 140 mg/dL      Peak postprandial:   less than 180 mg/dL (1-2 hours)      Critically ill patients:  140 - 180 mg/dL   Lab Results  Component Value Date   GLUCAP 184 (H) 09/24/2016   HGBA1C 6.5 (H) 08/29/2016    Review of Glycemic Control  Results for Cheryl Wheeler, [facility] (MRN [id number]) as of 09/24/2016 08:20  Ref. Range 09/23/2016 12:23 09/23/2016 16:46 09/23/2016 19:32 09/24/2016 00:02 09/24/2016 04:15  Glucose-Capillary Latest Ref Range: 65 - 99 mg/dL 621281 (H) 308198 (H) 657193 (H) 262 (H) 184 (H)    Diabetes history: Diabetes Mellitus Outpatient Diabetes medications: Lantus 14 units daily Current orders for Inpatient glycemic control: Novolog sensitive q 4 hours, Lantus 5 units q HS, Solumedrol 40 mg IV q 6 hours  Inpatient Diabetes Program Recommendations:  Please consider increasing Lantus to 10 units q HS.  Also consider increasing Novolog correction to moderate q 4 hours.  Susette RacerJulie Cyana Shook, RN, BA, MHA, CDE Diabetes Coordinator Inpatient Diabetes Program  [phone] (Team Pager) [phone] [facility] Office) 09/24/2016 8:21 AM

## 2016-09-25 LAB — COMPREHENSIVE METABOLIC PANEL
ALT: 12 U/L — AB (ref 14–54)
AST: 16 U/L (ref 15–41)
Albumin: 2.7 g/dL — ABNORMAL LOW (ref 3.5–5.0)
Alkaline Phosphatase: 37 U/L — ABNORMAL LOW (ref 38–126)
Anion gap: 13 (ref 5–15)
BUN: 8 mg/dL (ref 6–20)
CHLORIDE: 82 mmol/L — AB (ref 101–111)
CO2: 39 mmol/L — AB (ref 22–32)
CREATININE: 0.38 mg/dL — AB (ref 0.44–1.00)
Calcium: 9.1 mg/dL (ref 8.9–10.3)
GFR calc non Af Amer: >60 mL/min (ref >60)
Glucose, Bld: 237 mg/dL — ABNORMAL HIGH (ref 65–99)
Potassium: 3.6 mmol/L (ref 3.5–5.1)
SODIUM: 134 mmol/L — AB (ref 135–145)
Total Bilirubin: 0.5 mg/dL (ref 0.3–1.2)
Total Protein: 5.7 g/dL — ABNORMAL LOW (ref 6.5–8.1)

## 2016-09-25 LAB — CBC
HCT: 31.9 % — ABNORMAL LOW (ref 36.0–46.0)
Hemoglobin: 9.6 g/dL — ABNORMAL LOW (ref 12.0–15.0)
MCH: 26 pg (ref 26.0–34.0)
MCHC: 30.1 g/dL (ref 30.0–36.0)
MCV: 86.4 fL (ref 78.0–100.0)
PLATELETS: 266 10*3/uL (ref 150–400)
RBC: 3.69 MIL/uL — AB (ref 3.87–5.11)
RDW: 18 % — ABNORMAL HIGH (ref 11.5–15.5)
WBC: 5.3 10*3/uL (ref 4.0–10.5)

## 2016-09-25 LAB — GLUCOSE, CAPILLARY
GLUCOSE-CAPILLARY: 204 mg/dL — AB (ref 65–99)
Glucose-Capillary: 171 mg/dL — ABNORMAL HIGH (ref 65–99)
Glucose-Capillary: 232 mg/dL — ABNORMAL HIGH (ref 65–99)
Glucose-Capillary: 353 mg/dL — ABNORMAL HIGH (ref 65–99)

## 2016-09-25 MED ORDER — AMLODIPINE BESYLATE 10 MG PO TABS
10.0000 mg | ORAL_TABLET | Freq: Every day | ORAL | Status: DC
  Administered 2016-09-26 – 2016-09-28 (×3): 10 mg via ORAL
  Filled 2016-09-25 (×3): qty 1

## 2016-09-25 MED ORDER — PREDNISONE 20 MG PO TABS
20.0000 mg | ORAL_TABLET | Freq: Two times a day (BID) | ORAL | Status: DC
  Administered 2016-09-26 – 2016-09-28 (×6): 20 mg via ORAL
  Filled 2016-09-25 (×6): qty 1

## 2016-09-25 MED ORDER — ACETAMINOPHEN 325 MG PO TABS
650.0000 mg | ORAL_TABLET | Freq: Four times a day (QID) | ORAL | Status: DC | PRN
  Administered 2016-09-25 – 2016-09-28 (×4): 650 mg via ORAL
  Filled 2016-09-25 (×4): qty 2

## 2016-09-25 MED ORDER — ALBUTEROL SULFATE (2.5 MG/3ML) 0.083% IN NEBU
2.5000 mg | INHALATION_SOLUTION | RESPIRATORY_TRACT | Status: DC | PRN
  Filled 2016-09-25: qty 3

## 2016-09-25 MED ORDER — INSULIN GLARGINE 100 UNIT/ML ~~LOC~~ SOLN
10.0000 [IU] | SUBCUTANEOUS | Status: DC
  Administered 2016-09-25: 10 [IU] via SUBCUTANEOUS
  Filled 2016-09-25 (×2): qty 0.1

## 2016-09-25 NOTE — Progress Notes (Signed)
[place] TEAM 1 - Stepdown/ICU TEAM  Cheryl Wheeler  UJW:[id number] DOB: November 28, 1965 DOA: 09/17/2016 PCP: Noni SaupeEDDING II,JOHN F., MD    Brief Narrative:  50 yo female w/ a Hx of COPD on home oxygen and Bipap, Chronic granulomatous disease, HTN, Bipolar, anxiety, and GERD who [facility] 08/22/16 with dyspnea, cough, fever, hypoxia, hyponatremia.  She remained on Bipap and [facility].  Significant Events: 11/12 - [facility] where his acute on chronic COPD exacerbation and hypoosmolar hyponatremia 11/14 - Started on vancomycin and cefepime 11/16 - [facility] due to continuous need for BiPAP and increased oxygen requirement.  11/17 - Intubated. CT chest negative for PE.  11/18 - Bronch RML, no DAH, some erthyema blood, BAL sent, tolerated 11/24 - extubated  11/26 - TRH primary  11/30 - Transfer to inpatient rehab  12/08 - To ICU with hypoxia, add Vfend 12/14 - Transfer to SDU 12/15 - TRH assumed care   Subjective: The patient is resting comfortably in bed.  She denies chest pain shortness of breath fevers chills nausea or vomiting at this time.  She reports a good appetite and good intake.  Assessment & Plan:  Acute hypoxic respiratory failure with b/l pulmonary infiltrates Tolerating nasal cannula oxygen during day and BiPAP at night  RSV+ 08/28/16  Recurrent fungal infection - chronic granulomatous disease - continue voriconazole per PCCM - needs to be on life long bactrim and antifungal therapy in setting of CGD   COPD - wean off steroids - no wheezing on exam today  Presumed OSA - Bipap qhs and prn  HTN - Poorly controlled - adjust treatment and follow  HLD - Continue Lipitor  Hx of bipolar - anxiety - remeron, valproic acid - appears well compensated at this time  Steroid induced hyperglycemia - CBG not at goal - adjust treatment and follow  Anemia of  critical illness - Hemoglobin stable  DVT prophylaxis: lovenox  Code Status: FULL CODE Family Communication: no family present at time of exam  Disposition Plan: CIR   Consultants:  PCCM  Rehab / CIR   Antimicrobials:  Rocephin 11/21 - 11/24 Bactrim BID 11/30 > Cefepime 11/14 - 11/16; 12/ 4 >12/11 Vancomycin 11/14 - 11/16; 12/4 >12/11 Diflucan 09/15/2016> 12/9 Voriconazole 12/9 >  Objective: Blood pressure (!) 163/74, pulse 76, temperature 97.9 F (36.6 C), temperature source Oral, resp. rate 18, height 4' 11.5" (1.511 m), weight 86.7 kg (191 lb 2.2 oz), SpO2 96 %.  Intake/Output Summary (Last 24 hours) at 09/25/16 1338 Last data filed at 09/25/16 1105  Gross per 24 hour  Intake          1190.83 ml  Output             2850 ml  Net         -1659.17 ml   Filed Weights   09/24/16 0500 09/25/16 0400 09/25/16 0809  Weight: 80 kg (176 lb 5.9 oz) 83.2 kg (183 lb 6.8 oz) 86.7 kg (191 lb 2.2 oz)    Examination: General: No acute respiratory distress at rest  Lungs: Clear to auscultation bilaterally without wheezes or crackles Cardiovascular: Regular rate and rhythm without murmur gallop or rub  Abdomen: Nontender, nondistended, soft, bowel sounds positive, no rebound, no ascites, no appreciable mass Extremities: No significant cyanosis, or clubbing - trace edema bilateral lower extremities  CBC:  Recent Labs Lab 09/19/16 0307 09/20/16 0316 09/21/16 0304 09/22/16 0235  09/23/16 0337 09/24/16 0333 09/25/16 0243  WBC 3.6* 3.2* 4.2 6.4 5.7 5.0 5.3  NEUTROABS 2.0 2.5 3.4  --   --   --   --   HGB 8.6* 8.6* 8.8* 9.1* 9.2* 9.5* 9.6*  HCT 28.6* 28.8* 30.3* 30.9* 30.9* 31.5* 31.9*  MCV 85.9 86.5 87.6 87.0 85.6 85.6 86.4  PLT 173 186 201 253 250 254 266   Basic Metabolic Panel:  Recent Labs Lab 09/19/16 0307 09/20/16 0316 09/21/16 0307 09/22/16 0235 09/23/16 0337 09/24/16 0333 09/25/16 0243  NA 134* 137 140 136 134* 135 134*  K 4.3 4.8 4.2 4.1 3.8 3.7 3.6  CL 91*  94* 91* 87* 83* 82* 82*  CO2 38* 31 39* 39* 41* 46* 39*  GLUCOSE 136* 132* 144* 169* 160* 191* 237*  BUN <5* <5* 5* 10 9 8 8   CREATININE 0.48 0.32* 0.37* 0.50 0.40* 0.35* 0.38*  CALCIUM 8.5* 8.7* 9.1 8.9 8.8* 9.0 9.1  MG 1.9  --   --  1.6*  --  1.9  --   PHOS 4.4 3.7 4.3 4.0  --  3.5  --    GFR: Estimated Creatinine Clearance: 80.5 mL/min (by C-G formula based on SCr of 0.38 mg/dL (L)).  Liver Function Tests:  Recent Labs Lab 09/19/16 0307 09/20/16 0316 09/21/16 0307 09/22/16 0235 09/25/16 0243  AST  --   --   --  14* 16  ALT  --   --   --  11* 12*  ALKPHOS  --   --   --  39 37*  BILITOT  --   --   --  0.4 0.5  PROT  --   --   --  5.6* 5.7*  ALBUMIN 2.2* 2.2* 2.3* 2.4* 2.7*    HbA1C: Hgb A1c MFr Bld  Date/Time Value Ref Range Status  08/29/2016 02:49 AM 6.5 (H) 4.8 - 5.6 % Final    Comment:    (NOTE)         Pre-diabetes: 5.7 - 6.4         Diabetes: >6.4         Glycemic control for adults with diabetes: <7.0     CBG:  Recent Labs Lab 09/24/16 1130 09/24/16 1722 09/24/16 2322 09/25/16 0804 09/25/16 1158  GLUCAP 277* 278* 389* 171* 204*     Scheduled Meds: . amLODipine  5 mg Oral Daily  . atorvastatin  10 mg Oral Daily  . chlorhexidine  15 mL Mouth Rinse BID  . enoxaparin (LOVENOX) injection  40 mg Subcutaneous Q24H  . feeding supplement  1 Container Oral TID BM  . Gerhardt's butt cream   Topical TID  . insulin aspart  0-20 Units Subcutaneous TID WC  . insulin aspart  0-5 Units Subcutaneous QHS  . insulin glargine  5 Units Subcutaneous Q24H  . ipratropium-albuterol  3 mL Nebulization TID  . labetalol  300 mg Oral TID  . mouth rinse  15 mL Mouth Rinse q12n4p  . methylPREDNISolone (SOLU-MEDROL) injection  40 mg Intravenous Q12H  . mirtazapine  45 mg Oral QHS  . pantoprazole  40 mg Oral Daily  . sulfamethoxazole-trimethoprim  1 tablet Oral Q12H  . valproic acid  500 mg Oral BID  . voriconazole  250 mg Intravenous Q12H    LOS: 8 days   Lonia Blood, MD [facility] Office  [phone] Pager - Text Page per Loretha Stapler as per below:  On-Call/Text Page:      [email or web address]  password [email or web address]  If 7PM-7AM, please contact night-coverage [email or web address] Password [email or web address] 09/25/2016, 1:38 PM

## 2016-09-26 LAB — GLUCOSE, CAPILLARY
GLUCOSE-CAPILLARY: 192 mg/dL — AB (ref 65–99)
GLUCOSE-CAPILLARY: 256 mg/dL — AB (ref 65–99)
Glucose-Capillary: 226 mg/dL — ABNORMAL HIGH (ref 65–99)
Glucose-Capillary: 419 mg/dL — ABNORMAL HIGH (ref 65–99)

## 2016-09-26 MED ORDER — FUROSEMIDE 10 MG/ML IJ SOLN
80.0000 mg | Freq: Once | INTRAMUSCULAR | Status: AC
  Administered 2016-09-26: 80 mg via INTRAVENOUS
  Filled 2016-09-26: qty 8

## 2016-09-26 MED ORDER — FUROSEMIDE 40 MG PO TABS
40.0000 mg | ORAL_TABLET | Freq: Two times a day (BID) | ORAL | Status: DC
  Administered 2016-09-27 – 2016-09-28 (×4): 40 mg via ORAL
  Filled 2016-09-26 (×4): qty 1

## 2016-09-26 MED ORDER — INSULIN GLARGINE 100 UNIT/ML ~~LOC~~ SOLN
16.0000 [IU] | SUBCUTANEOUS | Status: DC
  Administered 2016-09-26: 16 [IU] via SUBCUTANEOUS
  Filled 2016-09-26 (×2): qty 0.16

## 2016-09-26 NOTE — Progress Notes (Signed)
Pharmacy Antibiotic Note  Cheryl Wheeler is a 50 y.o. female admitted on 09/17/2016 with chronic granulomatous disease.  Pharmacy has been consulted for voriconazole dosing. Patient is also on chronic Bactrim. LFTs remain stable. Patient is afebrile and WBC is wnl.  Spoke with microbiology lab about fungal cx. Expect result to be back sometime this week, was sent to state lab on 12/11. Will continue to follow for speciation.  Plan: - Continue voriconazole 250 mg (~4mg /kg AdjBW) IV q12h - Monitor LFTs  - Follow up speciation of fungal culture  Height: 4' 11.5" (151.1 cm) Weight: 186 lb 8.2 oz (84.6 kg) IBW/kg (Calculated) : 44.35  Temp (24hrs), Avg:98.2 F (36.8 C), Min:97.9 F (36.6 C), Max:98.6 F (37 C)   Recent Labs Lab 09/19/16 1201  09/21/16 0304 09/21/16 0307 09/22/16 0235 09/23/16 0337 09/24/16 0333 09/25/16 0243  WBC  --   < > 4.2  --  6.4 5.7 5.0 5.3  CREATININE  --   < >  --  0.37* 0.50 0.40* 0.35* 0.38*  VANCOTROUGH 17  --   --   --   --   --   --   --   < > = values in this interval not displayed.  Estimated Creatinine Clearance: 79.5 mL/min (by C-G formula based on SCr of 0.38 mg/dL (L)).    Allergies  Allergen Reactions  . Fentanyl Shortness Of Breath  . Meperidine Other (See Comments)    chest pain-"feels like squeezing heart"  . Azithromycin Other (See Comments)    VRE  . Codeine Nausea Only  . Levofloxacin Other (See Comments)    Unknown  . Other Other (See Comments)    BLACK STICHES=BODY REJECTS THEM  . Penicillins Rash  . Sulfa Antibiotics Rash    Tolerates Bactrim    Antimicrobials this admission:  Vancomycin 12/4 >>12/11 Cefepime 12/4 >>12/11 Fluconazole 12/06 >> 12/8 Voriconazole 12/8 >> On chronic Bactrim      Dose adjustments this admission:  12/07: VT 8 (Goal 15-20) > change to 1g IV q8 12/10: VT 17 > continue 1g IV q8 12/11: Voriconazole 6 mg/kg q12h >> ~4 mg/kg Adj BW q12h   Microbiology results:  12/5 UCx: no growth  12/5  Sputum: moderate fungus, awaiting identification 12/8 aspergillus Ag: 0.6 (range 0 - 0.49)  Thank you for allowing pharmacy to be a part of this patient's care.  Casilda Carlsaylor Danique Hartsough, PharmD, BCPS PGY-2 Infectious Diseases Pharmacy Resident Pager: [phone] 09/26/2016 11:20 AM

## 2016-09-26 NOTE — Progress Notes (Signed)
[place] TEAM 1 - Stepdown/ICU TEAM  Cheryl Wheeler  [id number] DOB: April 12, 1966 DOA: 09/17/2016 PCP: Noni SaupeEDDING II,JOHN F., MD    Brief Narrative:  50 yo female w/ a Hx of COPD on home oxygen and Bipap, Chronic granulomatous disease, HTN, Bipolar, anxiety, and GERD who [facility] 08/22/16 with dyspnea, cough, fever, hypoxia, hyponatremia.  She remained on Bipap and [facility].  Upon recovery, she was d/c to CIR.  She then decompensated while on CIR, and required re-admit to the acute units on 12/08.    Significant Events: 11/12 - Admitted [facility] w/ acute on chronic COPD exacerbation and hypoosmolar hyponatremia 11/14 - Started on vancomycin and cefepime 11/16 - [facility] due to continuous need for BiPAP and increased oxygen requirement.  11/17 - Intubated. CT chest negative for PE.  11/18 - Bronch RML, no DAH, some erthyema blood, BAL sent, tolerated 11/24 - extubated  11/26 - TRH primary  11/30 - Transfer to inpatient rehab  12/08 - To ICU with hypoxia, add Vfend 12/14 - Transfer to SDU 12/15 - TRH assumed care   Subjective: The patient states she is having a little more difficulty with her breathing today.  She denies wheezing cough or congestion.  She denies headache or chest pain.  She is somewhat tachypneic at time of visit but is able to carry a conversation.  Assessment & Plan:  Acute hypoxic respiratory failure with b/l pulmonary infiltrates Tolerating nasal cannula oxygen during day and BiPAP at night - somewhat labored today - diurese and follow   Recurrent fungal infection - chronic granulomatous disease - continue voriconazole per PCCM - needs to be on life long bactrim and antifungal therapy in setting of CGD per PCCM  COPD - wean off steroids slowly - no wheezing on exam   Chronic grade 1 Diastolic CHF  - pt appears mildly volume overloaded on exam  today - diurese aggressively and follow   Filed Weights   09/25/16 0400 09/25/16 0809 09/26/16 0645  Weight: 83.2 kg (183 lb 6.8 oz) 86.7 kg (191 lb 2.2 oz) 84.6 kg (186 lb 8.2 oz)    Presumed OSA - Bipap QHS and prn  HTN - BP improving - follow w/o change today   HLD - Continue Lipitor  Hx of bipolar - anxiety - remeron, valproic acid - appears well compensated at this time  Steroid induced hyperglycemia - adjust tx again today as CBG remains elevated   Anemia of critical illness - Hemoglobin stable  DVT prophylaxis: lovenox  Code Status: FULL CODE Family Communication: no family present at time of exam  Disposition Plan: CIR   Consultants:  PCCM  Rehab / CIR   Antimicrobials:  Rocephin 11/21 - 11/24 Bactrim BID 11/30 > 12/10 + 12/15 > Cefepime 11/14 - 11/16; 12/ 4 >12/11 Vancomycin 11/14 - 11/16; 12/4 >12/11 Diflucan 09/15/2016> 12/9 Voriconazole 12/8 >  Objective: Blood pressure (!) 156/71, pulse 74, temperature 98.6 F (37 C), temperature source Oral, resp. rate 15, height 4' 11.5" (1.511 m), weight 84.6 kg (186 lb 8.2 oz), SpO2 98 %.  Intake/Output Summary (Last 24 hours) at 09/26/16 1520 Last data filed at 09/26/16 0653  Gross per 24 hour  Intake              725 ml  Output             1475 ml  Net             -  750 ml   Filed Weights   09/25/16 0400 09/25/16 0809 09/26/16 0645  Weight: 83.2 kg (183 lb 6.8 oz) 86.7 kg (191 lb 2.2 oz) 84.6 kg (186 lb 8.2 oz)    Examination: General: Mild tachypnea today with sats in the lower 90s Lungs: Faint bibasilar crackles with no active wheezing Cardiovascular: Regular rate and rhythm without murmur gallop or rub  Abdomen: Nontender, overweight, soft, bowel sounds positive, no rebound, no ascites, no appreciable mass Extremities: 1+ bilateral lower extremity edema  CBC:  Recent Labs Lab 09/20/16 0316 09/21/16 0304 09/22/16 0235 09/23/16 0337 09/24/16 0333 09/25/16 0243  WBC 3.2* 4.2 6.4 5.7 5.0  5.3  NEUTROABS 2.5 3.4  --   --   --   --   HGB 8.6* 8.8* 9.1* 9.2* 9.5* 9.6*  HCT 28.8* 30.3* 30.9* 30.9* 31.5* 31.9*  MCV 86.5 87.6 87.0 85.6 85.6 86.4  PLT 186 201 253 250 254 266   Basic Metabolic Panel:  Recent Labs Lab 09/20/16 0316 09/21/16 0307 09/22/16 0235 09/23/16 0337 09/24/16 0333 09/25/16 0243  NA 137 140 136 134* 135 134*  K 4.8 4.2 4.1 3.8 3.7 3.6  CL 94* 91* 87* 83* 82* 82*  CO2 31 39* 39* 41* 46* 39*  GLUCOSE 132* 144* 169* 160* 191* 237*  BUN <5* 5* 10 9 8 8   CREATININE 0.32* 0.37* 0.50 0.40* 0.35* 0.38*  CALCIUM 8.7* 9.1 8.9 8.8* 9.0 9.1  MG  --   --  1.6*  --  1.9  --   PHOS 3.7 4.3 4.0  --  3.5  --    GFR: Estimated Creatinine Clearance: 79.5 mL/min (by C-G formula based on SCr of 0.38 mg/dL (L)).  Liver Function Tests:  Recent Labs Lab 09/20/16 0316 09/21/16 0307 09/22/16 0235 09/25/16 0243  AST  --   --  14* 16  ALT  --   --  11* 12*  ALKPHOS  --   --  39 37*  BILITOT  --   --  0.4 0.5  PROT  --   --  5.6* 5.7*  ALBUMIN 2.2* 2.3* 2.4* 2.7*    HbA1C: Hgb A1c MFr Bld  Date/Time Value Ref Range Status  08/29/2016 02:49 AM 6.5 (H) 4.8 - 5.6 % Final    Comment:    (NOTE)         Pre-diabetes: 5.7 - 6.4         Diabetes: >6.4         Glycemic control for adults with diabetes: <7.0     CBG:  Recent Labs Lab 09/25/16 1158 09/25/16 1649 09/25/16 2223 09/26/16 0902 09/26/16 1258  GLUCAP 204* 232* 353* 192* 256*     Scheduled Meds: . amLODipine  10 mg Oral Daily  . atorvastatin  10 mg Oral Daily  . chlorhexidine  15 mL Mouth Rinse BID  . enoxaparin (LOVENOX) injection  40 mg Subcutaneous Q24H  . feeding supplement  1 Container Oral TID BM  . Gerhardt's butt cream   Topical TID  . insulin aspart  0-20 Units Subcutaneous TID WC  . insulin aspart  0-5 Units Subcutaneous QHS  . insulin glargine  10 Units Subcutaneous Q24H  . ipratropium-albuterol  3 mL Nebulization TID  . labetalol  300 mg Oral TID  . mouth rinse  15 mL  Mouth Rinse q12n4p  . mirtazapine  45 mg Oral QHS  . pantoprazole  40 mg Oral Daily  . predniSONE  20 mg Oral BID WC  .  sulfamethoxazole-trimethoprim  1 tablet Oral Q12H  . valproic acid  500 mg Oral BID  . voriconazole  250 mg Intravenous Q12H    LOS: 9 days   Lonia Blood, MD [facility] Office  [phone] Pager - Text Page per Loretha Stapler as per below:  On-Call/Text Page:      [email or web address]      password [email or web address]  If 7PM-7AM, please contact night-coverage [email or web address] Password [email or web address] 09/26/2016, 3:20 PM

## 2016-09-27 DIAGNOSIS — Z9981 Dependence on supplemental oxygen: Secondary | ICD-10-CM

## 2016-09-27 DIAGNOSIS — B449 Aspergillosis, unspecified: Secondary | ICD-10-CM

## 2016-09-27 DIAGNOSIS — J449 Chronic obstructive pulmonary disease, unspecified: Secondary | ICD-10-CM

## 2016-09-27 DIAGNOSIS — F17211 Nicotine dependence, cigarettes, in remission: Secondary | ICD-10-CM

## 2016-09-27 DIAGNOSIS — E46 Unspecified protein-calorie malnutrition: Secondary | ICD-10-CM

## 2016-09-27 DIAGNOSIS — J984 Other disorders of lung: Secondary | ICD-10-CM

## 2016-09-27 DIAGNOSIS — Z888 Allergy status to other drugs, medicaments and biological substances status: Secondary | ICD-10-CM

## 2016-09-27 DIAGNOSIS — Z91048 Other nonmedicinal substance allergy status: Secondary | ICD-10-CM

## 2016-09-27 DIAGNOSIS — Z880 Allergy status to penicillin: Secondary | ICD-10-CM

## 2016-09-27 DIAGNOSIS — B49 Unspecified mycosis: Secondary | ICD-10-CM

## 2016-09-27 DIAGNOSIS — Z882 Allergy status to sulfonamides status: Secondary | ICD-10-CM

## 2016-09-27 DIAGNOSIS — Z885 Allergy status to narcotic agent status: Secondary | ICD-10-CM

## 2016-09-27 DIAGNOSIS — Z881 Allergy status to other antibiotic agents status: Secondary | ICD-10-CM

## 2016-09-27 LAB — GLUCOSE, CAPILLARY
GLUCOSE-CAPILLARY: 105 mg/dL — AB (ref 65–99)
GLUCOSE-CAPILLARY: 370 mg/dL — AB (ref 65–99)
Glucose-Capillary: 204 mg/dL — ABNORMAL HIGH (ref 65–99)
Glucose-Capillary: 287 mg/dL — ABNORMAL HIGH (ref 65–99)

## 2016-09-27 MED ORDER — INSULIN GLARGINE 100 UNIT/ML ~~LOC~~ SOLN
16.0000 [IU] | Freq: Two times a day (BID) | SUBCUTANEOUS | Status: DC
Start: 2016-09-27 — End: 2016-09-28
  Administered 2016-09-27 – 2016-09-28 (×3): 16 [IU] via SUBCUTANEOUS
  Filled 2016-09-27 (×4): qty 0.16

## 2016-09-27 MED ORDER — VORICONAZOLE 200 MG PO TABS
200.0000 mg | ORAL_TABLET | Freq: Two times a day (BID) | ORAL | Status: DC
  Administered 2016-09-27: 200 mg via ORAL
  Filled 2016-09-27 (×2): qty 1

## 2016-09-27 MED ORDER — VORICONAZOLE 50 MG PO TABS
50.0000 mg | ORAL_TABLET | Freq: Two times a day (BID) | ORAL | Status: DC
  Administered 2016-09-27: 50 mg via ORAL
  Filled 2016-09-27 (×2): qty 1

## 2016-09-27 NOTE — Progress Notes (Signed)
Inpatient Rehabilitation  I have insurance authorization to admit patient to IP Rehab today if medically ready.  Please plan for my co-worker, Weldon PickingSusan Blankenship to follow up later today.    Charlane FerrettiMelissa Jullian Previti, M.A., CCC/SLP Admission Coordinator  [facility]  Cell [phone]

## 2016-09-27 NOTE — Progress Notes (Signed)
Inpatient Rehabilitation  I note pt. With potential need for PICC per ID.  I have spoken with Dr. David StallFeliz Ortiz who plans to see pt. but is currently unsure of her medical readiness for CIR today. I will follow along for readiness.  Please call if questions.  Weldon PickingSusan Ara [place] PT Inpatient Rehab Admissions Coordinator Cell [phone] Office [phone]\

## 2016-09-27 NOTE — Progress Notes (Addendum)
[facility] PROGRESS NOTE    Progress Note  Cheryl Wheeler  ZOX:[id number] DOB: 04/13/1966 DOA: 09/17/2016 PCP: Jeanie Sewer Valrie Hart., MD     Brief Narrative:   Cheryl Wheeler is an 50 y.o. female past medical history of COPD oxygen dependent, chronic granulomatosis disease, bipolar disorder who originally presented to [facility] with dyspnea, fever hypoxia and hyponatremia remain on BiPAP and was transferred to [facility].  Assessment/Plan:  Significant Events: 11/12 - Admitted to [facility] w/ acute on chronic COPD exacerbation and hypoosmolar hyponatremia 11/14 - Started on vancomycin and cefepime 11/16 - Transferred to [facility] ICU for possible tracheostomy due to continuous need for BiPAP and increased oxygen requirement.  11/17 - Intubated. CT chest negative for PE.  11/18 - Bronch RML, no DAH, some erthyema blood, BAL sent, tolerated 11/24 - Extubated  11/26 - [facility] primary  11/30 - Transfer to inpatient rehab  12/08 - To ICU with hypoxia, add Vfend 12/14 - Transfer to SDU 12/15 - [facility] assumed care   Acute respiratory failure with hypoxemia with B/L pulmonary infiltrates With positive RSV on 08/29/2016 and her current full infection: Was intermittently on BiPAP. Tolerating nasal cannula, on Bactrim and voriconazole. BAL showed moderate fungus speciation pending.  Recurrent: Infection/chronic granulomatosis disease: Continue her cholesterol percent PCCM. We'll need to follow-up with infectious [facility]. Consult ID for duration of treatment and to recommend oral vs IV.  COPD: Continue to wean off steroids slowly.  Chronic grade 1 diastolic heart failure: He appears to be volume overload and exam  Obesity hypoventilation syndrome (HCC) Bipap at night PRN.  Essential hypertension Blood pressure seems to be stable, will continue to monitor.  Hypoalbuminemia due to protein-calorie malnutrition (HCC)   History of bipolar disorder: Continue  Remeron) facet. Next  Steroid-induced hyperglycemia: Remains elevated will continue to adjust insulin.  Anemia of critical illness: Hemoglobin sensory stable.   Studies: BX duplex on 08/26/2016 was negative for DVT. Bronchoscopy on 08/28/2017 right middle lobe showed no diffuse alveolar hemorrhage some erythema. CT chest on 09/15/2016 show consolidation of both upper lobe and left lower lobe, with increasing cervical mediastinal lymph nodes and which are  borderline Large  DVT prophylaxis: lovenox Family Communication:none Disposition Plan/Barrier to D/C: CIR in am Code Status:     Code Status Orders        Start     Ordered   09/17/16 1939  Full code  Continuous     09/17/16 1938    Code Status History    Date Active Date Inactive Code Status Order ID Comments User Context   09/09/2016  6:10 PM 09/17/2016  4:12 PM Full Code [id number]  Charlton Amor, PA-C Inpatient   09/09/2016  6:10 PM 09/09/2016  6:10 PM Full Code [id number]  Charlton Amor, PA-C Inpatient   08/26/2016  5:34 PM 09/09/2016  5:47 PM Full Code [id number]  Almon Hercules, MD Inpatient    Advance Directive Documentation   Flowsheet Row Most Recent Value  Type of Advance Directive  Healthcare Power of Attorney  Pre-existing out of facility DNR order (yellow form or pink MOST form)  No data  "MOST" Form in Place?  No data        IV Access:    Peripheral IV   Procedures and diagnostic studies:   No results found.   Medical Consultants:    None.  Anti-Infectives:   *Voriconazole started on 09/18/2016 Bactrim  Subjective:    Cheryl Wheeler Relates  her breathing is better.  Objective:    Vitals:   09/27/16 0400 09/27/16 0500 09/27/16 0600 09/27/16 0743  BP: 140/67 (!) 147/70 (!) 155/71 (!) 149/82  Pulse: 63 64 63 74  Resp: 17 16 (!) 22 15  Temp:    98 F (36.7 C)  TempSrc:    Axillary  SpO2: 95% 91% 91% 92%  Weight:      Height:        Intake/Output Summary (Last 24  hours) at 09/27/16 0934 Last data filed at 09/27/16 0552  Gross per 24 hour  Intake              730 ml  Output             3900 ml  Net            -3170 ml   Filed Weights   09/25/16 0809 09/26/16 0645 09/27/16 0339  Weight: 86.7 kg (191 lb 2.2 oz) 84.6 kg (186 lb 8.2 oz) 81.3 kg (179 lb 3.7 oz)    Exam: General exam: In no acute distress. Respiratory system: Good air movement and clear to auscultation. Cardiovascular system: S1 & S2 heard, RRR. No JVD, murmurs, rubs, gallops or clicks.  Gastrointestinal system: Abdomen is nondistended, soft and nontender.  Extremities: No pedal edema. Skin: No rashes, lesions or ulcers Psychiatry: Judgement and insight appear normal. Mood & affect appropriate.    Data Reviewed:    Labs: Basic Metabolic Panel:  Recent Labs Lab 09/21/16 0307 09/22/16 0235 09/23/16 0337 09/24/16 0333 09/25/16 0243  NA 140 136 134* 135 134*  K 4.2 4.1 3.8 3.7 3.6  CL 91* 87* 83* 82* 82*  CO2 39* 39* 41* 46* 39*  GLUCOSE 144* 169* 160* 191* 237*  BUN 5* 10 9 8 8   CREATININE 0.37* 0.50 0.40* 0.35* 0.38*  CALCIUM 9.1 8.9 8.8* 9.0 9.1  MG  --  1.6*  --  1.9  --   PHOS 4.3 4.0  --  3.5  --    GFR Estimated Creatinine Clearance: 77.8 mL/min (by C-G formula based on SCr of 0.38 mg/dL (L)). Liver Function Tests:  Recent Labs Lab 09/21/16 0307 09/22/16 0235 09/25/16 0243  AST  --  14* 16  ALT  --  11* 12*  ALKPHOS  --  39 37*  BILITOT  --  0.4 0.5  PROT  --  5.6* 5.7*  ALBUMIN 2.3* 2.4* 2.7*   No results for input(s): LIPASE, AMYLASE in the last 168 hours. No results for input(s): AMMONIA in the last 168 hours. Coagulation profile No results for input(s): INR, PROTIME in the last 168 hours.  CBC:  Recent Labs Lab 09/21/16 0304 09/22/16 0235 09/23/16 0337 09/24/16 0333 09/25/16 0243  WBC 4.2 6.4 5.7 5.0 5.3  NEUTROABS 3.4  --   --   --   --   HGB 8.8* 9.1* 9.2* 9.5* 9.6*  HCT 30.3* 30.9* 30.9* 31.5* 31.9*  MCV 87.6 87.0 85.6 85.6  86.4  PLT 201 253 250 254 266   Cardiac Enzymes: No results for input(s): CKTOTAL, CKMB, CKMBINDEX, TROPONINI in the last 168 hours. BNP (last 3 results) No results for input(s): PROBNP in the last 8760 hours. CBG:  Recent Labs Lab 09/26/16 0902 09/26/16 1258 09/26/16 1715 09/26/16 2139 09/27/16 0739  GLUCAP 192* 256* 226* 419* 105*   D-Dimer: No results for input(s): DDIMER in the last 72 hours. Hgb A1c: No results for input(s): HGBA1C in the last 72 hours. Lipid  Profile: No results for input(s): CHOL, HDL, LDLCALC, TRIG, CHOLHDL, LDLDIRECT in the last 72 hours. Thyroid function studies: No results for input(s): TSH, T4TOTAL, T3FREE, THYROIDAB in the last 72 hours.  Invalid input(s): FREET3 Anemia work up: No results for input(s): VITAMINB12, FOLATE, FERRITIN, TIBC, IRON, RETICCTPCT in the last 72 hours. Sepsis Labs:  Recent Labs Lab 09/22/16 0235 09/23/16 0337 09/24/16 0333 09/25/16 0243  WBC 6.4 5.7 5.0 5.3   Microbiology No results found for this or any previous visit (from the past 240 hour(s)).   Medications:   . amLODipine  10 mg Oral Daily  . atorvastatin  10 mg Oral Daily  . chlorhexidine  15 mL Mouth Rinse BID  . enoxaparin (LOVENOX) injection  40 mg Subcutaneous Q24H  . feeding supplement  1 Container Oral TID BM  . furosemide  40 mg Oral BID  . Gerhardt's butt cream   Topical TID  . insulin aspart  0-20 Units Subcutaneous TID WC  . insulin aspart  0-5 Units Subcutaneous QHS  . insulin glargine  16 Units Subcutaneous Q24H  . ipratropium-albuterol  3 mL Nebulization TID  . labetalol  300 mg Oral TID  . mouth rinse  15 mL Mouth Rinse q12n4p  . mirtazapine  45 mg Oral QHS  . pantoprazole  40 mg Oral Daily  . predniSONE  20 mg Oral BID WC  . sulfamethoxazole-trimethoprim  1 tablet Oral Q12H  . valproic acid  500 mg Oral BID  . voriconazole  250 mg Intravenous Q12H   Continuous Infusions:  Time spent: 25 min   LOS: 10 days   Marinda ElkFELIZ ORTIZ,  ABRAHAM  [facility] Pager [phone]  *Please refer to [email or web address], password [email or web address] to get updated schedule on who will round on this patient, as hospitalists switch teams weekly. If 7PM-7AM, please contact night-coverage at [email or web address], password [email or web address] for any overnight needs.  09/27/2016, 9:34 AM

## 2016-09-27 NOTE — Progress Notes (Signed)
PCCM Progress Note   Admission date:  09/17/2016 Referring provider:  Dr. Suzan GaribaldiPatel CIR  CC:  Short of breath  HPI:  50 yo [facility] with dyspnea, cough, fever, hypoxia, hyponatremia.  She remained on Bipap and [facility].  PMHx of COPD on home oxygen and Bipap, Chronic granulomatous disease ([facility]), HTN, Bipolar, anxiety, GERD  Subjective:    Still some SOB but feels near baseline.    Vital signs: BP (!) 149/82   Pulse 74   Temp 98 F (36.7 C) (Axillary)   Resp 15   Ht 4' 11.5" (1.511 m)   Wt 81.3 kg (179 lb 3.7 oz)   SpO2 92%   BMI 35.59 kg/m    Intake/output: I/O last 3 completed shifts: In: 1330 [P.O.:1080; IV Piggyback:250] Out: 5125 [Urine:5125]  General: pleasant, NAD in bed eating breakfast  Neuro: awake, alert, appropriate, follows commands HEENT: no stridor Cardiac: regular, no murmur Chest: resps even non labored on [place], no audible wheeze Abd: soft, non tender Ext: 1+ edema Skin: no rashes   CMP Latest Ref Rng & Units 09/25/2016 09/24/2016 09/23/2016  Glucose 65 - 99 mg/dL 161(W237(H) 960(A191(H) 540(J160(H)  BUN 6 - 20 mg/dL 8 8 9   Creatinine 0.44 - 1.00 mg/dL 8.11(B0.38(L) 1.47(W0.35(L) 2.95(A0.40(L)  Sodium 135 - 145 mmol/L 134(L) 135 134(L)  Potassium 3.5 - 5.1 mmol/L 3.6 3.7 3.8  Chloride 101 - 111 mmol/L 82(L) 82(L) 83(L)  CO2 22 - 32 mmol/L 39(H) 46(H) 41(H)  Calcium 8.9 - 10.3 mg/dL 9.1 9.0 2.1(H8.8(L)  Total Protein 6.5 - 8.1 g/dL 0.8(M5.7(L) - -  Total Bilirubin 0.3 - 1.2 mg/dL 0.5 - -  Alkaline Phos 38 - 126 U/L 37(L) - -  AST 15 - 41 U/L 16 - -  ALT 14 - 54 U/L 12(L) - -    CBC Latest Ref Rng & Units 09/25/2016 09/24/2016 09/23/2016  WBC 4.0 - 10.5 K/uL 5.3 5.0 5.7  Hemoglobin 12.0 - 15.0 g/dL 5.7(Q9.6(L) 4.6(N9.5(L) 6.2(X9.2(L)  Hematocrit 36.0 - 46.0 % 31.9(L) 31.5(L) 30.9(L)  Platelets 150 - 400 K/uL 266 254 250    ABG    Component Value Date/Time   PHART 7.246 (L) 09/19/2016 0900   PCO2ART 74.9  (HH) 09/19/2016 0900   PO2ART 65.8 (L) 09/19/2016 0900   HCO3 31.4 (H) 09/19/2016 0900   TCO2 41 08/28/2016 0753   O2SAT 89.0 09/19/2016 0900   CBG (last 3)   Recent Labs  09/26/16 1715 09/26/16 2139 09/27/16 0739  GLUCAP 226* 419* 105*    Imaging: No results found.   Studies:  Venous Duplex 11/16 >> negative for DVT  Bronch 11/18 >> RML, no DAH, some erythema blood rml  CT Chest 12/6 > consolidation in upper lobes and left lower lobe. Increase interval and mediastinal lymph nodes, borderline enlarged, maybe reactive   Antibiotics: Voriconazole 12/9 >  Cultures: Sputum 12/05 >> moderate fungus >>   Serologies: 08/2016 >> A1AT 120, ANA negative, RF 10.6, anti DS DNA negative, Histone Ab negative, anti Smith Ab negative  [facility]: [facility] To [facility] 11/30 To CIR 12/08 To ICU with hyoxia, add Vfend 12/14 Transfer to SDU  Lines/tubes: ETT 11/16 >> 11/24  Assessment:  Acute hypoxic respiratory failure with b/l pulmonary infiltrates with Respiratory syncytial virus positive 08/28/16, and recurrent fungal infection. Hx of COPD, chronic granulomatous disease. Presumed OSA. - f/u CXR intermittently - oxygen to keep  SpO2 90 to 95% - Bipap qhs and prn - Continue voriconazole - f/u sputum culture from 12/05 - needs to be on life long bactrim and antifungal therapy in setting of CGD - scheduled BDs - PO prednisone with slow taper  - goal even to negative fluid balance  Hx of HTN, HLD. - labetalol, lipitor, norvasc  Hx of bipolar, anxiety. - remeron, valproic acid  Steroid induced hyperglycemia. - SSI with lantus  Anemia of critical illness. - f/u CBC  DVT prophylaxis - Lovenox SUP - protonix Nutrition - Carb modified, heart healthy Goals of care - full code   PCCM singing off, please call back if needed.  Will arrange outpt pulm f/u (1/3 @3pm  with Kandice RobinsonsSarah Groce NP).     Dirk DressKaty Whiteheart, NP 09/27/2016  9:21  AM Pager: [phone] or [phone]  STAFF NOTE: I, Rory Percyaniel Naydeen Speirs, MD FACP have personally reviewed patient's available data, including medical history, events of note, physical examination and test results as part of my evaluation. I have discussed with resident/NP and other care providers such as pharmacist, RN and RRT. In addition, I personally evaluated patient and elicited key findings of: awake, no distress, not wheezing, RSV pneumonitis resolving about back to baseline, pred taper should be slow, did well with lasix and neg balance, likely will continue to improve with continued neg balance, will have pulm follow up made Signing off   Mcarthur RossettiDaniel J. Tyson AliasFeinstein, MD, FACP Pgr: [phone] [facility] 09/27/2016 3:02 PM

## 2016-09-27 NOTE — Progress Notes (Signed)
Inpatient Diabetes Program Recommendations  AACE/ADA: New Consensus Statement on Inpatient Glycemic Control (2015)  Target Ranges:  Prepandial:   less than 140 mg/dL      Peak postprandial:   less than 180 mg/dL (1-2 hours)      Critically ill patients:  140 - 180 mg/dL   Results for Cheryl Wheeler, [facility] (MRN [id number]) as of 09/27/2016 09:51  Ref. Range 09/26/2016 09:02 09/26/2016 12:58 09/26/2016 17:15 09/26/2016 21:39 09/27/2016 07:39  Glucose-Capillary Latest Ref Range: 65 - 99 mg/dL 409192 (H) 811256 (H) 914226 (H) 419 (H) 105 (H)   Review of Glycemic Control  Current orders for Inpatient glycemic control: Lantus 16 units Q24H, Novolog 0-20 units TID with meals, Novolog 0-5 units QHS  Inpatient Diabetes Program Recommendations: Insulin - Meal Coverage: If steroids are continued, please consider ordering Novolog 4 units TID with meals for meal coverage.  Thanks, Orlando PennerMarie Maila Dukes, RN, MSN, CDE Diabetes Coordinator Inpatient Diabetes Program [phone] (Team Pager from 8am to 5pm)

## 2016-09-27 NOTE — Progress Notes (Signed)
NP K Schorr made aware of CBG 416. 5 Units of novolog and 16 units lantus given

## 2016-09-27 NOTE — Progress Notes (Signed)
CSW spoke with patient in regards to SNF as back up to CIR.  Patient states that she would not be interested in SNF if she cannot return to CIR and would prefer to go home with home health.  Per patient CIR has already gotten approval from insurance and she is hopeful she will be cleared to return to rehab soon  CSW signing off  Burna SisJenna H. Takela Varden, LCSW Clinical Social Worker [phone]

## 2016-09-27 NOTE — Consult Note (Signed)
Date of Admission:  09/17/2016  Date of Consult:  09/27/2016  Reason for Consult: Fungal Pneumonia/Chronic granulomatous Disease Referring Physician: Dr. David Stall   HPI: Cheryl Wheeler is an 50 y.o. female.  PMHx significant for oxygen dependent COPD, AR chronic granulomatous disease, bipolar disorder, HTN, hx Aspergillus PNA.  Admitted to [facility] on 11/12 with acute COPD exacerbation.  Initially started on Vanc and Cefepime.  On 11/16, transferred to [facility] and intubated on 11/17.  She had a CT chest 11/17 negative for PE but did have mild patchy/ground glass opacities in the lateral right middle lobe, suspicious for pneumonia.  Underwent bronchoscopy 11/18 >> culture consistent with normal flora.  Multiple serologies from 08/2016 >> A1AT 120, ANA negative, RF 10.6, anti DS DNA negative, Histone Ab negative, anti Smith Ab negative.  Repeat CT on 12/5 with continued consolidation in upper lobes and left lower lobe.  Increase interval and mediastinal lymph nodes, maybe reactive.  She was also transferred to CIR on 11/30 and then readmitted to the ICU on 12/8 before being transferred back to step down on 12/14.  Repeat respiratory culture 12/8 with moderate fungus.  Most recent CXR from 12/15 with persistent patchy opacity bilaterally, L > R.  Suspicious for multifocal PNA.    She is followed by Allergy and Immunology at [facility].  Supposed to by on Bactrim 1 DS BID PPX and Itraconazole PPX.  She reports adherence to Bactrim but was not taking her Itraconazole prior to admission due to cost.  She reports also not being on any steroids or other immune modulating agents prior to admission.  Now on Prednisone 20mg  BID.  She reports since starting Voriconazole that she has improved from a respiratory standpoint and feels this was the turning point in her hospital course.  She currently denies cough.  Reports prior to admission having a cough that was mildly productive but cannot  recollect the appearance of her sputum.  She feels her current dyspnea is at baseline (on 2-3L oxygen at home, currently on 3L).  She otherwise denies dysuria, diarrhea, constipation, chest pain.  She reports some bladder incontinence, feeling lightheaded with ambulation, and chronic fibromyalgia pain.  She is looking forward to going back to CIR.  11/18 - Respiratory virus panel (+) RSV 12/5 - Respiratory culture --> moderate amount fungus isolated.  Final report pending and expected back sometime this week.  On Voriconazole since 12/8 12/8 - Aspergillus Ag 0.06  Antibiotics this admission: Vancomycin 12/4 >>12/11 Cefepime 12/4 >>12/11 Fluconazole 12/06 >> 12/8 Voriconazole 12/8 >> On chronic Bactrim    Past Medical History:  Diagnosis Date  . Anxiety   . Bipolar disorder (HCC)   . COPD (chronic obstructive pulmonary disease) (HCC)   . GERD (gastroesophageal reflux disease)   . Hypertension     History reviewed. No pertinent surgical history.  Social History:  reports that she quit smoking about 3 months ago. Her smoking use included Cigarettes. She has never used smokeless tobacco. She reports that she does not drink alcohol or use drugs.   History reviewed. No pertinent family history.  Allergies  Allergen Reactions  . Fentanyl Shortness Of Breath  . Meperidine Other (See Comments)    chest pain-"feels like squeezing heart"  . Azithromycin Other (See Comments)    VRE  . Codeine Nausea Only  . Levofloxacin Other (See Comments)    Unknown  . Other Other (See Comments)    BLACK STICHES=BODY REJECTS THEM  .  Penicillins Rash  . Sulfa Antibiotics Rash    Tolerates Bactrim     Medications: I have reviewed patients current medications as documented in Epic Anti-infectives    Start     Dose/Rate Route Frequency Ordered Stop   09/24/16 1030  sulfamethoxazole-trimethoprim (BACTRIM DS,SEPTRA DS) 800-160 MG per tablet 1 tablet     1 tablet Oral Every 12 hours 09/24/16 1025      09/20/16 1800  voriconazole (VFEND) 250 mg in sodium chloride 0.9 % 100 mL IVPB     250 mg 62.5 mL/hr over 120 Minutes Intravenous Every 12 hours 09/20/16 1401     09/18/16 1800  voriconazole (VFEND) 310 mg in sodium chloride 0.9 % 100 mL IVPB  Status:  Discontinued     4 mg/kg  78.5 kg 65.5 mL/hr over 120 Minutes Intravenous Every 12 hours 09/17/16 1709 09/20/16 1401   09/17/16 2200  sulfamethoxazole-trimethoprim (BACTRIM DS,SEPTRA DS) 800-160 MG per tablet 1 tablet  Status:  Discontinued     1 tablet Oral 2 times daily 09/17/16 1938 09/20/16 0959   09/17/16 2000  vancomycin (VANCOCIN) IVPB 1000 mg/200 mL premix  Status:  Discontinued     1,000 mg 200 mL/hr over 60 Minutes Intravenous Every 8 hours 09/17/16 1703 09/20/16 0959   09/17/16 1800  ceFEPIme (MAXIPIME) 2 g in dextrose 5 % 50 mL IVPB  Status:  Discontinued     2 g 100 mL/hr over 30 Minutes Intravenous Every 12 hours 09/17/16 1703 09/20/16 0959   09/17/16 1800  voriconazole (VFEND) 470 mg in sodium chloride 0.9 % 100 mL IVPB     6 mg/kg  78.5 kg 73.5 mL/hr over 120 Minutes Intravenous Every 12 hours 09/17/16 1709 09/18/16 0808         ROS:  As noted in HPI otherwise remainder of 12 point Review of Systems is negative  Blood pressure (!) 159/66, pulse 79, temperature 98 F (36.7 C), temperature source Axillary, resp. rate 15, height 4' 11.5" (1.511 m), weight 179 lb 3.7 oz (81.3 kg), SpO2 93 %. General: Alert and awake, oriented x3, not in any acute distress. HEENT: anicteric sclera,  EOMI, oropharynx clear and without exudate Cardiovascular: regular rate, normal rhthm,  no murmur Pulmonary: clear to auscultation bilaterally, no wheezing, diminished at bases, wearing Atkins supplemental oxygen Gastrointestinal: soft, nontender, nondistended Musculoskeletal: trace edema, SCD in place GU: Foley in place Skin, soft tissue: no rashes Neuro: nonfocal, strength and sensation intact   Results for orders placed or performed  during the hospital encounter of 09/17/16 (from the past 48 hour(s))  Glucose, capillary     Status: Abnormal   Collection Time: 09/25/16 11:58 AM  Result Value Ref Range   Glucose-Capillary 204 (H) 65 - 99 mg/dL  Glucose, capillary     Status: Abnormal   Collection Time: 09/25/16  4:49 PM  Result Value Ref Range   Glucose-Capillary 232 (H) 65 - 99 mg/dL  Glucose, capillary     Status: Abnormal   Collection Time: 09/25/16 10:23 PM  Result Value Ref Range   Glucose-Capillary 353 (H) 65 - 99 mg/dL  Glucose, capillary     Status: Abnormal   Collection Time: 09/26/16  9:02 AM  Result Value Ref Range   Glucose-Capillary 192 (H) 65 - 99 mg/dL  Glucose, capillary     Status: Abnormal   Collection Time: 09/26/16 12:58 PM  Result Value Ref Range   Glucose-Capillary 256 (H) 65 - 99 mg/dL  Glucose, capillary  Status: Abnormal   Collection Time: 09/26/16  5:15 PM  Result Value Ref Range   Glucose-Capillary 226 (H) 65 - 99 mg/dL  Glucose, capillary     Status: Abnormal   Collection Time: 09/26/16  9:39 PM  Result Value Ref Range   Glucose-Capillary 419 (H) 65 - 99 mg/dL  Glucose, capillary     Status: Abnormal   Collection Time: 09/27/16  7:39 AM  Result Value Ref Range   Glucose-Capillary 105 (H) 65 - 99 mg/dL   @BRIEFLABTABLE (sdes,specrequest,cult,reptstatus)   ) Recent Results (from the past 720 hour(s))  Culture, Urine     Status: Abnormal   Collection Time: 09/01/16  8:51 AM  Result Value Ref Range Status   Specimen Description URINE, CATHETERIZED  Final   Special Requests NONE  Final   Culture 10,000 COLONIES/mL YEAST (A)  Final   Report Status 09/02/2016 FINAL  Final  Culture, respiratory (NON-Expectorated)     Status: None (Preliminary result)   Collection Time: 09/14/16  2:20 PM  Result Value Ref Range Status   Specimen Description TRACHEAL ASPIRATE  Final   Special Requests REC'D TASP  Final   Gram Stain   Final    FEW WBC PRESENT, PREDOMINANTLY PMN RARE GRAM  POSITIVE RODS RARE GRAM POSITIVE COCCI    Culture   Final    MODERATE FUNGUS ISOLATED SENT TO STATE FOR IDENTIFICATION    Report Status PENDING  Incomplete  Culture, Urine     Status: None   Collection Time: 09/14/16  5:38 PM  Result Value Ref Range Status   Specimen Description URINE, RANDOM  Final   Special Requests NONE  Final   Culture NO GROWTH  Final   Report Status 09/15/2016 FINAL  Final     Impression/Recommendation  Active Problems:   Obesity hypoventilation syndrome (HCC)   Essential hypertension   Hypoalbuminemia due to protein-calorie malnutrition (HCC)   Benign essential HTN   Type 2 diabetes mellitus with peripheral neuropathy (HCC)   Chronic granulomatous disease (HCC)   Acute respiratory failure with hypoxemia (HCC)   Fungal infection   Cheryl Wheeler is a 50 y.o. female with  oxygen dependent COPD, AR chronic granulomatous disease, bipolar disorder, HTN, hx Aspergillus PNA.  Here with acute hypoxic respiratory failure, bilateral pulmonary infiltrates, RSV, and recurrent fungal infection.  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  No final identification but Aspergillus Ag from 12/8 non-elevated.  Has been on Voriconazole since 12/8. Feels that she has improved since starting Voriconazole.  Has chronic dyspnea that she feels is at her baseline. - continue Voriconazole.  Will need several weeks of therapy and pending microbiology report but can transition to PO - will subsequently need to be back on Itraconazole PPX as [facility]  AR Chronic Granulomatous Disease - Continue Bactrim PPX - [facility]    09/27/2016, 11:51 AM   Thank you so much for this interesting consult  [facility] [phone] (pager) [phone] (office) 09/27/2016, 11:51  AM  Gwynn BurlyAndrew Albino Bufford 09/27/2016, 11:51 AM

## 2016-09-27 NOTE — Care Management Important Message (Signed)
Important Message  Patient Details  Name: Cheryl Wheeler MRN: [id number] Date of Birth: 1966/09/02   Medicare Important Message Given:  Yes    Cheryl Wheeler, [name] [name] 09/27/2016, 10:56 AM

## 2016-09-27 NOTE — Progress Notes (Signed)
Occupational Therapy Treatment Patient Details Name: Cheryl Wheeler MRN: [id number] DOB: [birth year] Today's Date: 09/27/2016    History of present illness Pateint is a 50 year old female admitted for acute respiratory failure with hypoxemia on 08/18/16 from CIR. Patient intubated 08/27/16, extubated 09/03/16. CIR 09/09/16-09/17/16. PMH includes advanced COPD with 3LO2 nasal cannula use at home, chronic granulomatous disease, HTN, bipolar disorder, and anxiety.   OT comments  This 50 yo female admitted with above presents to acute OT today with intent of working on self care skills with me. Sitting up on EOB pt with decrease in O2 sats to 86% on 3 liters increased O2 to 4 liters and with purse lipped breathing came up to 94%. Pt quite anxious due to her increased work of breathing with activity so really had to get her to focus on breathing techniques to slow it down and be more efficient.   Follow Up Recommendations  CIR;Supervision/Assistance - 24 hour    Equipment Recommendations  None recommended by OT    Recommendations for Other Services Rehab consult    Precautions / Restrictions Precautions Precautions: Fall Precaution Comments: watch sats Restrictions Weight Bearing Restrictions: No       Mobility Bed Mobility Overal bed mobility: Needs Assistance Bed Mobility: Supine to Sit     Supine to sit: Min guard     General bed mobility comments: increased time, HOB up and use of rail, coming out on right hand side  Transfers Overall transfer level: Needs assistance Equipment used: Rolling walker (2 wheeled) Transfers: Sit to/from [employer]Stand;Stand Pivot Transfers Sit to Stand: Min guard Stand pivot transfers: Min guard            Balance Overall balance assessment: Needs assistance Sitting-balance support: No upper extremity supported;Feet supported Sitting balance-Leahy Scale: Good     Standing balance support: Bilateral upper extremity supported;During functional  activity Standing balance-Leahy Scale: Poor Standing balance comment: reliant on use of RW                   ADL Overall ADL's : Needs assistance/impaired                         Toilet Transfer: Min guard;Ambulation;Stand-pivot (bed>recliner)                              Cognition   Behavior During Therapy: Anxious (due to increased work of breathing; VCs for purse lipped breathing) Overall Cognitive Status: Within Functional Limits for tasks assessed                                    Pertinent Vitals/ Pain       Pain Assessment: Faces Faces Pain Scale: Hurts little more Pain Location: low back Pain Descriptors / Indicators: Aching;Sore Pain Intervention(s): Limited activity within patient's tolerance;Monitored during session;Repositioned         Frequency  Min 2X/week        Progress Toward Goals  OT Goals(current goals can now be found in the care plan section)  Progress towards OT goals: Progressing toward goals     Plan Discharge plan remains appropriate       End of Session Equipment Utilized During Treatment: Rolling walker;Oxygen;Gait belt   Activity Tolerance  (limited by anxiousness due to increased work of breathing with activity)  Patient Left in chair;with call bell/phone within reach;with chair alarm set   Nurse Communication  (pt would like her bed to be made--notified tech)        Time: [id number] OT Time Calculation (min): 32 min  Charges: OT General Charges $OT Visit: 1 Procedure OT Treatments $Self Care/Home Management : 23-37 mins  Evette GeorgesLeonard, Shaleena Crusoe Eva [phone] 09/27/2016, 2:25 PM

## 2016-09-28 ENCOUNTER — Inpatient Hospital Stay (HOSPITAL_COMMUNITY)
Admission: RE | Admit: 2016-09-28 | Discharge: 2016-10-16 | DRG: 947 | Disposition: A | Discharge status: Home Health | Payer: Medicare HMO | Source: Intra-hospital | Attending: Physical Medicine & Rehabilitation | Admitting: Physical Medicine & Rehabilitation

## 2016-09-28 ENCOUNTER — Encounter (HOSPITAL_COMMUNITY): Payer: Self-pay | Admitting: *Deleted

## 2016-09-28 DIAGNOSIS — D62 Acute posthemorrhagic anemia: Secondary | ICD-10-CM | POA: Diagnosis present

## 2016-09-28 DIAGNOSIS — I5033 Acute on chronic diastolic (congestive) heart failure: Secondary | ICD-10-CM | POA: Diagnosis present

## 2016-09-28 DIAGNOSIS — J440 Chronic obstructive pulmonary disease with acute lower respiratory infection: Secondary | ICD-10-CM | POA: Diagnosis present

## 2016-09-28 DIAGNOSIS — D71 Functional disorders of polymorphonuclear neutrophils: Secondary | ICD-10-CM | POA: Diagnosis present

## 2016-09-28 DIAGNOSIS — R7309 Other abnormal glucose: Secondary | ICD-10-CM | POA: Diagnosis not present

## 2016-09-28 DIAGNOSIS — F319 Bipolar disorder, unspecified: Secondary | ICD-10-CM | POA: Diagnosis present

## 2016-09-28 DIAGNOSIS — E11649 Type 2 diabetes mellitus with hypoglycemia without coma: Secondary | ICD-10-CM | POA: Diagnosis not present

## 2016-09-28 DIAGNOSIS — E1142 Type 2 diabetes mellitus with diabetic polyneuropathy: Secondary | ICD-10-CM | POA: Diagnosis present

## 2016-09-28 DIAGNOSIS — K219 Gastro-esophageal reflux disease without esophagitis: Secondary | ICD-10-CM | POA: Diagnosis present

## 2016-09-28 DIAGNOSIS — G8929 Other chronic pain: Secondary | ICD-10-CM | POA: Diagnosis present

## 2016-09-28 DIAGNOSIS — Z6833 Body mass index (BMI) 33.0-33.9, adult: Secondary | ICD-10-CM

## 2016-09-28 DIAGNOSIS — I110 Hypertensive heart disease with heart failure: Secondary | ICD-10-CM | POA: Diagnosis present

## 2016-09-28 DIAGNOSIS — E8809 Other disorders of plasma-protein metabolism, not elsewhere classified: Secondary | ICD-10-CM | POA: Diagnosis present

## 2016-09-28 DIAGNOSIS — J9611 Chronic respiratory failure with hypoxia: Secondary | ICD-10-CM

## 2016-09-28 DIAGNOSIS — Z87891 Personal history of nicotine dependence: Secondary | ICD-10-CM

## 2016-09-28 DIAGNOSIS — Y95 Nosocomial condition: Secondary | ICD-10-CM | POA: Diagnosis present

## 2016-09-28 DIAGNOSIS — R0989 Other specified symptoms and signs involving the circulatory and respiratory systems: Secondary | ICD-10-CM

## 2016-09-28 DIAGNOSIS — K5900 Constipation, unspecified: Secondary | ICD-10-CM | POA: Diagnosis present

## 2016-09-28 DIAGNOSIS — E785 Hyperlipidemia, unspecified: Secondary | ICD-10-CM | POA: Diagnosis present

## 2016-09-28 DIAGNOSIS — R32 Unspecified urinary incontinence: Secondary | ICD-10-CM | POA: Diagnosis not present

## 2016-09-28 DIAGNOSIS — Z880 Allergy status to penicillin: Secondary | ICD-10-CM

## 2016-09-28 DIAGNOSIS — R339 Retention of urine, unspecified: Secondary | ICD-10-CM

## 2016-09-28 DIAGNOSIS — F419 Anxiety disorder, unspecified: Secondary | ICD-10-CM | POA: Diagnosis present

## 2016-09-28 DIAGNOSIS — E871 Hypo-osmolality and hyponatremia: Secondary | ICD-10-CM | POA: Diagnosis present

## 2016-09-28 DIAGNOSIS — J189 Pneumonia, unspecified organism: Secondary | ICD-10-CM | POA: Diagnosis present

## 2016-09-28 DIAGNOSIS — Z79899 Other long term (current) drug therapy: Secondary | ICD-10-CM

## 2016-09-28 DIAGNOSIS — K5903 Drug induced constipation: Secondary | ICD-10-CM | POA: Diagnosis not present

## 2016-09-28 DIAGNOSIS — J9621 Acute and chronic respiratory failure with hypoxia: Secondary | ICD-10-CM

## 2016-09-28 DIAGNOSIS — Z794 Long term (current) use of insulin: Secondary | ICD-10-CM

## 2016-09-28 DIAGNOSIS — G894 Chronic pain syndrome: Secondary | ICD-10-CM

## 2016-09-28 DIAGNOSIS — Z7951 Long term (current) use of inhaled steroids: Secondary | ICD-10-CM

## 2016-09-28 DIAGNOSIS — Z888 Allergy status to other drugs, medicaments and biological substances status: Secondary | ICD-10-CM

## 2016-09-28 DIAGNOSIS — Z9981 Dependence on supplemental oxygen: Secondary | ICD-10-CM | POA: Diagnosis not present

## 2016-09-28 DIAGNOSIS — Z881 Allergy status to other antibiotic agents status: Secondary | ICD-10-CM

## 2016-09-28 DIAGNOSIS — M549 Dorsalgia, unspecified: Secondary | ICD-10-CM | POA: Diagnosis present

## 2016-09-28 DIAGNOSIS — R5381 Other malaise: Secondary | ICD-10-CM | POA: Diagnosis present

## 2016-09-28 DIAGNOSIS — Z885 Allergy status to narcotic agent status: Secondary | ICD-10-CM

## 2016-09-28 DIAGNOSIS — R262 Difficulty in walking, not elsewhere classified: Secondary | ICD-10-CM

## 2016-09-28 DIAGNOSIS — E1169 Type 2 diabetes mellitus with other specified complication: Secondary | ICD-10-CM

## 2016-09-28 DIAGNOSIS — J9600 Acute respiratory failure, unspecified whether with hypoxia or hypercapnia: Secondary | ICD-10-CM | POA: Diagnosis present

## 2016-09-28 DIAGNOSIS — E669 Obesity, unspecified: Secondary | ICD-10-CM | POA: Diagnosis present

## 2016-09-28 DIAGNOSIS — I10 Essential (primary) hypertension: Secondary | ICD-10-CM

## 2016-09-28 DIAGNOSIS — R3915 Urgency of urination: Secondary | ICD-10-CM | POA: Diagnosis not present

## 2016-09-28 DIAGNOSIS — E662 Morbid (severe) obesity with alveolar hypoventilation: Secondary | ICD-10-CM

## 2016-09-28 LAB — CBC
HEMATOCRIT: 35.9 % — AB (ref 36.0–46.0)
HEMOGLOBIN: 11 g/dL — AB (ref 12.0–15.0)
MCH: 26.6 pg (ref 26.0–34.0)
MCHC: 30.6 g/dL (ref 30.0–36.0)
MCV: 86.7 fL (ref 78.0–100.0)
Platelets: 318 10*3/uL (ref 150–400)
RBC: 4.14 MIL/uL (ref 3.87–5.11)
RDW: 18.7 % — ABNORMAL HIGH (ref 11.5–15.5)
WBC: 7.6 10*3/uL (ref 4.0–10.5)

## 2016-09-28 LAB — CREATININE, SERUM: Creatinine, Ser: 0.45 mg/dL (ref 0.44–1.00)

## 2016-09-28 LAB — GLUCOSE, CAPILLARY
GLUCOSE-CAPILLARY: 229 mg/dL — AB (ref 65–99)
Glucose-Capillary: 74 mg/dL (ref 65–99)
Glucose-Capillary: 255 mg/dL — ABNORMAL HIGH (ref 65–99)
Glucose-Capillary: 256 mg/dL — ABNORMAL HIGH (ref 65–99)

## 2016-09-28 MED ORDER — INSULIN ASPART 100 UNIT/ML ~~LOC~~ SOLN: 0.0000 [IU] | Freq: Three times a day (TID) | SUBCUTANEOUS | 11 refills | Status: DC

## 2016-09-28 MED ORDER — SULFAMETHOXAZOLE-TRIMETHOPRIM 800-160 MG PO TABS
1.0000 | ORAL_TABLET | Freq: Two times a day (BID) | ORAL | Status: DC
  Administered 2016-09-28 – 2016-10-16 (×36): 1 via ORAL
  Filled 2016-09-28 (×37): qty 1

## 2016-09-28 MED ORDER — VALPROIC ACID 250 MG PO CAPS
500.0000 mg | ORAL_CAPSULE | Freq: Two times a day (BID) | ORAL | Status: DC
  Administered 2016-09-28 – 2016-10-16 (×36): 500 mg via ORAL
  Filled 2016-09-28 (×36): qty 2

## 2016-09-28 MED ORDER — IPRATROPIUM-ALBUTEROL 0.5-2.5 (3) MG/3ML IN SOLN
3.0000 mL | Freq: Three times a day (TID) | RESPIRATORY_TRACT | Status: DC
  Administered 2016-09-28 – 2016-10-15 (×40): 3 mL via RESPIRATORY_TRACT
  Filled 2016-09-28 (×16): qty 3
  Filled 2016-09-28: qty 39
  Filled 2016-09-28 (×22): qty 3
  Filled 2016-09-28: qty 6
  Filled 2016-09-28 (×7): qty 3

## 2016-09-28 MED ORDER — INSULIN ASPART 100 UNIT/ML ~~LOC~~ SOLN: 4.0000 [IU] | Freq: Three times a day (TID) | SUBCUTANEOUS | 11 refills | Status: DC

## 2016-09-28 MED ORDER — GERHARDT'S BUTT CREAM
TOPICAL_CREAM | Freq: Three times a day (TID) | CUTANEOUS | Status: DC
Start: 2016-09-28 — End: 2016-10-16
  Administered 2016-09-28 – 2016-10-01 (×8): via TOPICAL
  Administered 2016-10-01: 1 via TOPICAL
  Administered 2016-10-01 – 2016-10-06 (×15): via TOPICAL
  Administered 2016-10-06: 1 via TOPICAL
  Administered 2016-10-07 – 2016-10-09 (×5): via TOPICAL
  Administered 2016-10-09 (×2): 1 via TOPICAL
  Administered 2016-10-10: 23:00:00 via TOPICAL
  Administered 2016-10-10 (×2): 1 via TOPICAL
  Administered 2016-10-11 – 2016-10-16 (×16): via TOPICAL
  Filled 2016-09-28 (×2): qty 1

## 2016-09-28 MED ORDER — ACETAMINOPHEN 325 MG PO TABS
650.0000 mg | ORAL_TABLET | Freq: Four times a day (QID) | ORAL | Status: DC | PRN
  Administered 2016-09-29 – 2016-10-16 (×11): 650 mg via ORAL
  Filled 2016-09-28 (×12): qty 2

## 2016-09-28 MED ORDER — PANTOPRAZOLE SODIUM 40 MG PO TBEC
40.0000 mg | DELAYED_RELEASE_TABLET | Freq: Every day | ORAL | Status: DC
  Administered 2016-09-29 – 2016-10-16 (×18): 40 mg via ORAL
  Filled 2016-09-28 (×18): qty 1

## 2016-09-28 MED ORDER — PREDNISONE 20 MG PO TABS
20.0000 mg | ORAL_TABLET | Freq: Two times a day (BID) | ORAL | Status: DC
  Administered 2016-09-29 – 2016-10-08 (×19): 20 mg via ORAL
  Filled 2016-09-28 (×19): qty 1

## 2016-09-28 MED ORDER — FUROSEMIDE 40 MG PO TABS
40.0000 mg | ORAL_TABLET | Freq: Two times a day (BID) | ORAL | Status: DC
  Administered 2016-09-29 – 2016-10-16 (×35): 40 mg via ORAL
  Filled 2016-09-28 (×35): qty 1

## 2016-09-28 MED ORDER — ENOXAPARIN SODIUM 40 MG/0.4ML ~~LOC~~ SOLN
40.0000 mg | SUBCUTANEOUS | Status: DC
  Administered 2016-09-29 – 2016-10-16 (×18): 40 mg via SUBCUTANEOUS
  Filled 2016-09-28 (×16): qty 0.4

## 2016-09-28 MED ORDER — SORBITOL 70 % SOLN
30.0000 mL | Freq: Every day | Status: DC | PRN
  Administered 2016-09-28 – 2016-10-08 (×4): 30 mL via ORAL
  Filled 2016-09-28 (×4): qty 30

## 2016-09-28 MED ORDER — MIRTAZAPINE 15 MG PO TABS
45.0000 mg | ORAL_TABLET | Freq: Every day | ORAL | Status: DC
  Administered 2016-09-28 – 2016-10-15 (×18): 45 mg via ORAL
  Filled 2016-09-28 (×19): qty 3

## 2016-09-28 MED ORDER — LORAZEPAM 0.5 MG PO TABS
0.5000 mg | ORAL_TABLET | Freq: Three times a day (TID) | ORAL | Status: DC | PRN
  Administered 2016-09-28 – 2016-10-12 (×7): 0.5 mg via ORAL
  Filled 2016-09-28 (×7): qty 1

## 2016-09-28 MED ORDER — AMLODIPINE BESYLATE 10 MG PO TABS: 10.0000 mg | ORAL_TABLET | Freq: Every day | ORAL | Status: DC

## 2016-09-28 MED ORDER — VORICONAZOLE 50 MG PO TABS
250.0000 mg | ORAL_TABLET | Freq: Two times a day (BID) | ORAL | Status: DC
  Administered 2016-09-28: 250 mg via ORAL
  Filled 2016-09-28 (×2): qty 1

## 2016-09-28 MED ORDER — GERHARDT'S BUTT CREAM: 1.0000 "application " | TOPICAL_CREAM | Freq: Three times a day (TID) | CUTANEOUS | Status: DC

## 2016-09-28 MED ORDER — ONDANSETRON HCL 4 MG PO TABS: 4.0000 mg | ORAL_TABLET | Freq: Four times a day (QID) | ORAL | Status: DC | PRN

## 2016-09-28 MED ORDER — HYDROXYZINE HCL 25 MG PO TABS: 25.0000 mg | ORAL_TABLET | Freq: Four times a day (QID) | ORAL | Status: DC | PRN

## 2016-09-28 MED ORDER — ENOXAPARIN SODIUM 40 MG/0.4ML ~~LOC~~ SOLN: 40.0000 mg | SUBCUTANEOUS | Status: DC

## 2016-09-28 MED ORDER — AMLODIPINE BESYLATE 10 MG PO TABS
10.0000 mg | ORAL_TABLET | Freq: Every day | ORAL | Status: DC
  Administered 2016-09-29 – 2016-10-16 (×18): 10 mg via ORAL
  Filled 2016-09-28 (×18): qty 1

## 2016-09-28 MED ORDER — PREDNISONE 20 MG PO TABS: 20.0000 mg | ORAL_TABLET | Freq: Every day | ORAL | Status: DC

## 2016-09-28 MED ORDER — ALBUTEROL SULFATE (2.5 MG/3ML) 0.083% IN NEBU: 2.5000 mg | INHALATION_SOLUTION | RESPIRATORY_TRACT | Status: DC | PRN

## 2016-09-28 MED ORDER — LABETALOL HCL 300 MG PO TABS
300.0000 mg | ORAL_TABLET | Freq: Three times a day (TID) | ORAL | Status: DC
  Administered 2016-09-28 – 2016-10-16 (×53): 300 mg via ORAL
  Filled 2016-09-28 (×53): qty 1

## 2016-09-28 MED ORDER — MORPHINE SULFATE 15 MG PO TABS
15.0000 mg | ORAL_TABLET | Freq: Two times a day (BID) | ORAL | Status: DC | PRN
  Administered 2016-09-28 – 2016-10-16 (×35): 15 mg via ORAL
  Filled 2016-09-28 (×36): qty 1

## 2016-09-28 MED ORDER — ONDANSETRON HCL 4 MG/2ML IJ SOLN: 4.0000 mg | Freq: Four times a day (QID) | INTRAMUSCULAR | Status: DC | PRN

## 2016-09-28 MED ORDER — INSULIN GLARGINE 100 UNIT/ML ~~LOC~~ SOLN: 16.0000 [IU] | Freq: Every day | SUBCUTANEOUS | 11 refills | Status: DC

## 2016-09-28 MED ORDER — FUROSEMIDE 20 MG PO TABS: 40.0000 mg | ORAL_TABLET | Freq: Two times a day (BID) | ORAL | Status: DC

## 2016-09-28 MED ORDER — INSULIN ASPART 100 UNIT/ML ~~LOC~~ SOLN
0.0000 [IU] | Freq: Three times a day (TID) | SUBCUTANEOUS | Status: DC
  Administered 2016-09-29: 3 [IU] via SUBCUTANEOUS
  Administered 2016-09-30: 7 [IU] via SUBCUTANEOUS
  Administered 2016-10-01: 4 [IU] via SUBCUTANEOUS
  Administered 2016-10-01: 15 [IU] via SUBCUTANEOUS
  Administered 2016-10-02: 20 [IU] via SUBCUTANEOUS
  Administered 2016-10-03: 4 [IU] via SUBCUTANEOUS
  Administered 2016-10-03: 7 [IU] via SUBCUTANEOUS
  Administered 2016-10-04: 4 [IU] via SUBCUTANEOUS
  Administered 2016-10-04: 7 [IU] via SUBCUTANEOUS
  Administered 2016-10-05: 3 [IU] via SUBCUTANEOUS
  Administered 2016-10-05: 4 [IU] via SUBCUTANEOUS
  Administered 2016-10-06: 11 [IU] via SUBCUTANEOUS
  Administered 2016-10-06: 3 [IU] via SUBCUTANEOUS
  Administered 2016-10-07 – 2016-10-08 (×2): 4 [IU] via SUBCUTANEOUS
  Administered 2016-10-08: 7 [IU] via SUBCUTANEOUS
  Administered 2016-10-09: 4 [IU] via SUBCUTANEOUS
  Administered 2016-10-10 – 2016-10-11 (×2): 3 [IU] via SUBCUTANEOUS
  Administered 2016-10-11: 4 [IU] via SUBCUTANEOUS
  Administered 2016-10-12: 7 [IU] via SUBCUTANEOUS
  Administered 2016-10-12: 4 [IU] via SUBCUTANEOUS
  Administered 2016-10-13: 0 [IU] via SUBCUTANEOUS
  Administered 2016-10-13: 7 [IU] via SUBCUTANEOUS
  Administered 2016-10-14: 3 [IU] via SUBCUTANEOUS

## 2016-09-28 MED ORDER — ATORVASTATIN CALCIUM 10 MG PO TABS
10.0000 mg | ORAL_TABLET | Freq: Every day | ORAL | Status: DC
  Administered 2016-09-29 – 2016-10-16 (×18): 10 mg via ORAL
  Filled 2016-09-28 (×19): qty 1

## 2016-09-28 MED ORDER — BOOST / RESOURCE BREEZE PO LIQD
1.0000 | Freq: Three times a day (TID) | ORAL | Status: DC
  Administered 2016-09-28 – 2016-10-15 (×45): 1 via ORAL

## 2016-09-28 MED ORDER — MORPHINE SULFATE 15 MG PO TABS: 15.0000 mg | ORAL_TABLET | Freq: Two times a day (BID) | ORAL | 0 refills | Status: DC | PRN

## 2016-09-28 MED ORDER — INSULIN GLARGINE 100 UNIT/ML ~~LOC~~ SOLN
16.0000 [IU] | Freq: Two times a day (BID) | SUBCUTANEOUS | Status: DC
  Administered 2016-09-28 – 2016-10-03 (×10): 16 [IU] via SUBCUTANEOUS
  Filled 2016-09-28 (×11): qty 0.16

## 2016-09-28 MED ORDER — GUAIFENESIN-DM 100-10 MG/5ML PO SYRP: 5.0000 mL | ORAL_SOLUTION | ORAL | Status: DC | PRN

## 2016-09-28 MED ORDER — VORICONAZOLE 50 MG PO TABS
250.0000 mg | ORAL_TABLET | Freq: Two times a day (BID) | ORAL | Status: DC
  Administered 2016-09-28 – 2016-10-16 (×36): 250 mg via ORAL
  Filled 2016-09-28 (×39): qty 1

## 2016-09-28 MED ORDER — VORICONAZOLE 50 MG PO TABS: 250.0000 mg | ORAL_TABLET | Freq: Two times a day (BID) | ORAL | Status: DC

## 2016-09-28 NOTE — Progress Notes (Signed)
Ankit Karis Juba, MD Physician Signed Physical Medicine and Rehabilitation  PMR Pre-admission Date of Service: 09/23/2016 2:23 PM  Related encounter: Admission (Current) from 09/17/2016 in [facility] 4E CV SURGICAL PROGRESSIVE CARE       [] Hide copied text PMR Admission Coordinator Pre-Admission Assessment  Patient: Cheryl Wheeler is an 50 y.o., female MRN: [id number] DOB: 1965/12/05 Height: 4' 11.5" (151.1 cm) Weight: 82.7 kg (182 lb 5.1 oz)                                                                                                                                                  Insurance Information HMO:     PPO: X     PCP:      IPA:      80/20:      OTHER:  PRIMARY: AETNA Medicare       Policy#: [id number]      Subscriber: Self CM Name: Regino Bellow      Phone#: [phone]     Fax#: [phone] Pre-Cert#: [id number] approved  for 7 days with clinical updates due on or by 10/04/16 to [place], phone [phone], fax [phone]    Employer: Disabled  Benefits:  Phone #: [phone]     Name: reference # [id number] Eff. 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 Per chart review patient lives with spouse. One level homewith 5 steps to entry. Modified independent with rolling walker for household distances prior to admission. Uses a wheelchair outside of the home. Presented to [facility] 08/22/2016 for acute on chronic respiratory failure with low-grade fever and hypoxic in the 70s and required intubation.  Findings of mild hyponatremia 128. Started on Solu-Medrol and nebulizer treatments. Transferred to [facility] 08/26/2016 for ongoing medical care. Chest x-ray was negative for acute abnormality. CT angiogram of the chest negative for pulmonary embolibut concerning for pneumonia. RSV-B positive. Completed a course of broad-spectrum antibiotics.Pulmonary care service follow-up remain intubated through 09/03/2016.Prednisone Dosepak taper as directed. Physicaland occupationaltherapy evaluationscompleted 09/07/2016 with recommendations of physical medicine rehabilitation consult.Patient was admitted for a comprehensive rehabilitation program11/30/2017. She was able to propel her wheelchair with moderate cueing as well as ambulate very short distances due to fatigue. A recent CT the chest 09/15/2016 at the discretion of pulmonary services for follow-up of suspect HCAPshowed consolidation in upper lobes and left lower lobe and advised to continue antibiotic therapy that she had remained on with cefepime and vancomycin times an 8 day courseand completed. On 09/17/2016 with increasing shortness of breath oxygen saturations dipping into the 70s and maintained on Bipap. Chest x-ray showed no new abnormalities. She denied any chest pain. Pulmonary service follow-up and felt patient should be discharged to acute care services to  address pulmonary issues 09/17/2016. Placed on Solu-Medrol and tapered. Latest chest x-ray 09/23/2016 showing stable bilateral lung opacities.  Placed on Solu-Medrol and tapered. Latest chest x-ray 09/23/2016 showing stable bilateral lung opacities.Presently maintained on voriconazole secondary to acute respiratory failure. Tracheal aspirate growing moderate fungus awaiting speciation.Placed on subcutaneous Lovenox for DVT prophylaxis 09/24/2016.Patient again admitted to resume full inpatient rehabilitation therapies.       Past Medical History      Past Medical History:  Diagnosis Date  . Anxiety   . Bipolar disorder (HCC)   . COPD (chronic obstructive pulmonary disease) (HCC)   . GERD (gastroesophageal reflux disease)   . Hypertension     Family History  family history is not on file.  Prior Rehab/Hospitalizations:  Has the patient had major surgery during 100 days prior to admission? No  Current Medications   Current Facility-Administered Medications:  .  acetaminophen (TYLENOL) tablet 650 mg, 650 mg, Oral, Q6H PRN, Lonia Blood, MD, 650 mg at 09/27/16 1550 .  albuterol (PROVENTIL) (2.5 MG/3ML) 0.083% nebulizer solution 2.5 mg, 2.5 mg, Inhalation, Q2H PRN, Lonia Blood, MD .  amLODipine (NORVASC) tablet 10 mg, 10 mg, Oral, Daily, Lonia Blood, MD, 10 mg at 09/28/16 0981 .  atorvastatin (LIPITOR) tablet 10 mg, 10 mg, Oral, Daily, Lupita Leash, MD, 10 mg at 09/28/16 0921 .  chlorhexidine (PERIDEX) 0.12 % solution 15 mL, 15 mL, Mouth Rinse, BID, Lupita Leash, MD, 15 mL at 09/28/16 0920 .  enoxaparin (LOVENOX) injection 40 mg, 40 mg, Subcutaneous, Q24H, Emi Holes, RPH, 40 mg at 09/27/16 1258 .  feeding supplement (BOOST / RESOURCE BREEZE) liquid 1 Container, 1 Container, Oral, TID BM, Lupita Leash, MD, 1 Container at 09/28/16 0919 .  furosemide (LASIX) tablet 40 mg, 40 mg, Oral, BID, Lonia Blood, MD, 40 mg at 09/28/16 1914 .   Gerhardt's butt cream, , Topical, TID, Lupita Leash, MD .  guaiFENesin-dextromethorphan (ROBITUSSIN DM) 100-10 MG/5ML syrup 5 mL, 5 mL, Oral, Q4H PRN, Lupita Leash, MD .  hydrALAZINE (APRESOLINE) injection 10-20 mg, 10-20 mg, Intravenous, Q4H PRN, Duayne Cal, NP, 10 mg at 09/23/16 0437 .  hydrOXYzine (ATARAX/VISTARIL) tablet 25 mg, 25 mg, Oral, Q6H PRN, Lupita Leash, MD, 25 mg at 09/28/16 7829 .  insulin aspart (novoLOG) injection 0-20 Units, 0-20 Units, Subcutaneous, TID WC, Coralyn Helling, MD, 11 Units at 09/27/16  1820 .  insulin aspart (novoLOG) injection 0-5 Units, 0-5 Units, Subcutaneous, QHS, Coralyn HellingVineet Sood, MD, 5 Units at 09/27/16 2152 .  insulin glargine (LANTUS) injection 16 Units, 16 Units, Subcutaneous, BID, Marinda ElkAbraham Feliz Ortiz, MD, 16 Units at 09/28/16 0920 .  ipratropium-albuterol (DUONEB) 0.5-2.5 (3) MG/3ML nebulizer solution 3 mL, 3 mL, Nebulization, TID, Richarda OverlieNayana Abrol, MD, 3 mL at 09/28/16 0719 .  labetalol (NORMODYNE) tablet 300 mg, 300 mg, Oral, TID, Lupita Leashouglas B McQuaid, MD, 300 mg at 09/28/16 0921 .  LORazepam (ATIVAN) tablet 0.5 mg, 0.5 mg, Oral, Q8H PRN, Lupita Leashouglas B McQuaid, MD, 0.5 mg at 09/27/16 1958 .  MEDLINE mouth rinse, 15 mL, Mouth Rinse, q12n4p, Lupita Leashouglas B McQuaid, MD, 15 mL at 09/27/16 1628 .  mirtazapine (REMERON) tablet 45 mg, 45 mg, Oral, QHS, Lupita Leashouglas B McQuaid, MD, 45 mg at 09/27/16 2100 .  morphine (MSIR) tablet 15 mg, 15 mg, Oral, BID PRN, Lupita Leashouglas B McQuaid, MD, 15 mg at 09/28/16 0926 .  ondansetron (ZOFRAN) injection 4 mg, 4 mg, Intravenous, Q6H PRN, Lupita Leashouglas B McQuaid, MD .  pantoprazole (PROTONIX) EC tablet 40 mg, 40 mg, Oral, Daily, Lupita Leashouglas B McQuaid, MD, 40 mg at 09/28/16 0921 .  predniSONE (DELTASONE) tablet 20 mg, 20 mg, Oral, BID WC, Lonia BloodJeffrey T McClung, MD, 20 mg at 09/28/16 [id number] .  sulfamethoxazole-trimethoprim (BACTRIM DS,SEPTRA DS) 800-160 MG per tablet 1 tablet, 1 tablet, Oral, Q12H, Coralyn HellingVineet Sood, MD, 1 tablet at 09/28/16 0921 .  valproic acid  (DEPAKENE) 250 MG capsule 500 mg, 500 mg, Oral, BID, Lupita Leashouglas B McQuaid, MD, 500 mg at 09/28/16 [id number] .  voriconazole (VFEND) tablet 250 mg, 250 mg, Oral, Q12H, Bertram MillardMichael A Maccia, RPH, 250 mg at 09/28/16 [id number]  Patients Current Diet: Diet heart healthy/carb modified Room service appropriate? Yes; Fluid consistency: Thin Diet - low sodium heart healthy Diet Carb Modified  Precautions / Restrictions Precautions Precautions: Fall Precaution Comments: watch sats Restrictions Weight Bearing Restrictions: No   Has the patient had 2 or more falls or a fall with injury in the past year?No  Prior Activity Level Household: Prior to admission patient was disabled due to breathing issues. Spouse drove and patient left the house about once a month for medical appointments.   Home Assistive Devices / Equipment Home Assistive Devices/Equipment: Wheelchair, Bedside commode/3-in-1, Shower chair without back, Environmental consultantWalker (specify type), BIPAP, Hand-held shower hose, Oxygen, Eyeglasses Home Equipment: Environmental consultantWalker - 2 wheels, Bedside commode, Shower seat, Cane - single point, Wheelchair - manual  Prior Device Use: Indicate devices/aids used by the patient prior to current illness, exacerbation or injury? Walker  Prior Functional Level Prior Function Level of Independence: Needs assistance Gait / Transfers Assistance Needed: Modified independent with rolling walker for household distances. Uses w/c if out ADL's / Homemaking Assistance Needed: mod independent with basic ADL. Husband cooked/cleaned. Became SOB with ADL at times. Used w/c for community distances at baseline  Self Care: Did the patient need help bathing, dressing, using the toilet or eating? Needed some help  Indoor Mobility: Did the patient need assistance with walking from room to room (with or without device)? Independent  Stairs: Did the patient need assistance with internal or external stairs (with or without device)?  Independent  Functional Cognition: Did the patient need help planning regular tasks such as shopping or remembering to take medications? Independent  Current Functional Level Cognition Overall Cognitive Status: Within Functional Limits for tasks assessed Orientation Level: Oriented X4    Extremity Assessment (includes Sensation/Coordination) Upper Extremity Assessment: RUE deficits/detail, LUE  deficits/detail RUE Deficits / Details: AROM shoulder flexion 0- 90 degrees, AAROM 0-130 degrees,  AROM WFLs for elbow with strength 3+/5 and grip alos 3+/5.  Pt with history of radial nerve palsy demonstrates decreased full digit extension in the middle digit.  LUE Deficits / Details: AROM shoulder flexion 0-90 degrees.  AROM WFLS for elbow and digits, strength 4/5 for elbow flexion and grip strength  Lower Extremity Assessment: Defer to PT evaluation RLE Deficits / Details: stength 2+/5 hip flexion, 3+/5 knee extension and knee flexion.  LLE Deficits / Details: stength 2+/5 hip flexion, 3+/5 knee extension and knee flexion.    ADLs Overall ADL's : Needs assistance/impaired Eating/Feeding: Independent Grooming: Wash/dry hands, Wash/dry face, Set up, Sitting Upper Body Bathing: Sitting, Minimal assistance Lower Body Bathing: Moderate assistance, Sit to/from stand Upper Body Dressing : Minimal assistance, Sitting Lower Body Dressing: Maximal assistance, Sit to/from stand Toilet Transfer: Min guard, Ambulation, Stand-pivot (bed>recliner) Toileting- Clothing Manipulation and Hygiene: Moderate assistance, Sit to/from stand Toileting - Clothing Manipulation Details (indicate cue type and reason): simulated Functional mobility during ADLs: Minimal assistance, Rolling walker General ADL Comments: Pt with DOE 3/4, but sats remained >92% on 4L supplemental 02   Mobility Overal bed mobility: Needs Assistance Bed Mobility: Supine to Sit Supine to sit: Min guard Sit to supine: Mod assist General bed  mobility comments: cues for technique   Transfers Overall transfer level: Needs assistance Equipment used: Rolling walker (2 wheeled) Transfers: Sit to/from Stand Sit to Stand: Min assist Stand pivot transfers: Min guard General transfer comment: assist for balance up from EOB, then from chair with cues for hand placement   Ambulation / Gait / Stairs / Wheelchair Mobility Ambulation/Gait Ambulation/Gait assistance: Min assist, +2 safety/equipment Ambulation Distance (Feet): 15 Feet Assistive device: Rolling walker (2 wheeled) Gait Pattern/deviations: Step-to pattern, Trunk flexed, Shuffle, Decreased stride length General Gait Details: poor tolerance to activity on 3L O2 today with SpO2 down to 78%; raised to 4L O2 for consistent improvement to 90%; needed encouragement for walking with pt request to just walk in the room.  Gait velocity: decreased  Gait velocity interpretation: Below normal speed for age/gender   Posture / Balance Dynamic Sitting Balance Sitting balance - Comments: patient sitting independently in chair upon arrival. Sitting with back unsupported on EOB not tested.  Balance Overall balance assessment: Needs assistance Sitting-balance support: Feet supported Sitting balance-Leahy Scale: Good Sitting balance - Comments: patient sitting independently in chair upon arrival. Sitting with back unsupported on EOB not tested.  Postural control: Posterior lean Standing balance support: Bilateral upper extremity supported Standing balance-Leahy Scale: Poor Standing balance comment: needs UE support for balance   Special needs/care consideration BiPAP/CPAP: BiPAP at night CPM: No Continuous Drip IV: No Dialysis: No        Life Vest: No Oxygen: 2L [place] PTA Special Bed: No Trach Size: No Wound Vac (area): No       Skin: Bruises on arms                               Bowel mgmt: 09/26/16 last BM; pt. Reports some incontinence Bladder mgmt: foley catheter Diabetic mgmt: Diet  monitoring PTA    Previous Home Environment Living Arrangements: Spouse/significant other  Lives With: Spouse Available Help at Discharge: Family, Available 24 hours/day Type of Home: Mobile home Home Layout: One level Home Access: Stairs to enter Entrance Stairs-Rails: Left Entrance Stairs-Number of Steps: 5  Bathroom  Shower/Tub: Health visitor: Standard Home Care Services: Yes Type of Home Care Services: Home RN Home Care Agency (if known): [facility]  Discharge Living Setting Plans for Discharge Living Setting: Patient's home, Lives with (comment) Type of Home at Discharge: Mobile home Discharge Home Layout: One level Discharge Home Access: Stairs to enter Entrance Stairs-Rails: Left Entrance Stairs-Number of Steps: 5 Discharge Bathroom Shower/Tub: Walk-in shower Discharge Bathroom Toilet: Standard Discharge Bathroom Accessibility: Yes How Accessible: Accessible via walker Does the patient have any problems obtaining your medications?: Yes (Describe)  Social/Family/Support Systems Patient Roles: Spouse, Parent Contact Information: Spouse: Odette Watanabe [phone] Anticipated Caregiver: Spouse  Ability/Limitations of Caregiver: None - pt reports he is also on disability, however, independent and able to assist her. Caregiver Availability: 24/7 Discharge Plan Discussed with Primary Caregiver: Yes Is Caregiver In Agreement with Plan?: Yes Does Caregiver/Family have Issues with Lodging/Transportation while Pt is in Rehab?: No  Goals/Additional Needs Patient/Family Goal for Rehab: PT Mod I; OT Mod I-Supervision  Expected length of stay: 13-16 days Cultural Considerations: None Dietary Needs: Carb. Mod. Equipment Needs: TBD Special Service Needs: None Additional Information: Patient has been admitted to [facility] for beathing issues in September and October Pt/Family Agrees to Admission and willing to participate: Yes Program  Orientation Provided & Reviewed with Pt/Caregiver Including Roles  & Responsibilities: Yes Additional Information Needs: None Information Needs to be Provided By: N/A   Decrease burden of Care through IP rehab admission: No  Possible need for SNF placement upon discharge: No  Patient Condition: This patient's medical and functional status has changed since the consult dated: 09/07/16 in which the Rehabilitation Physician determined and documented that the patient's condition is appropriate for intensive rehabilitative care in an inpatient rehabilitation facility. See "History of Present Illness" (above) for medical update. Functional changes are: Pt.  Ambulating with min assist +2 and rolling walker and drops in O2 sats to  78% on 3L O2 and up to 90% when O2 titrated up to 4L O2.; min guard assist for toilet transfers . Patient's medical and functional status update has been discussed with the Rehabilitation physician and patient remains appropriate for inpatient rehabilitation. Will admit to inpatient rehab today.   Preadmission Screen Completed By:  Fae Pippin, 09/28/2016 12:09 PM ______________________________________________________________________   Discussed status with Dr.  Allena Katz on 09/28/16 at  1209  and received approval for admission today.  Admission Coordinator:  Weldon Picking, time 1209 /Date 09/28/16     Revision History    Date/Time User Provider Type Action  09/28/2016 12:35 PM Ankit Karis Juba, MD Physician Sign  09/28/2016 12:10 PM Weldon Picking Rehab Admission Coordinator Sign  09/28/2016 11:51 AM Weldon Picking Rehab Admission Coordinator Share  09/24/2016 1:27 PM Fae Pippin Rehab Admission Coordinator Share  View Details Report

## 2016-09-28 NOTE — Progress Notes (Signed)
Inpatient Diabetes Program Recommendations  AACE/ADA: New Consensus Statement on Inpatient Glycemic Control (2015)  Target Ranges:  Prepandial:   less than 140 mg/dL      Peak postprandial:   less than 180 mg/dL (1-2 hours)      Critically ill patients:  140 - 180 mg/dL   Results for Cheryl CorkROGERS, [facility] ([facility]N [id number]) as of 09/28/2016 08:40  Ref. Range 09/27/2016 07:39 09/27/2016 11:57 09/27/2016 17:26 09/27/2016 21:43 09/28/2016 08:10  Glucose-Capillary Latest Ref Range: 65 - 99 mg/dL 409105 (H) 811204 (H) 914287 (H) 370 (H) 74   Review of Glycemic Control  Current orders for Inpatient glycemic control:  Lantus 16 units BID, Novolog 0-20 units TID with meals, Novolog 0-5 units QHS  Inpatient Diabetes Program Recommendations: Insulin - Basal: Noted Lantus was increased from 16 units Q24H to 16 units BID on 09/27/16. Fasting glucose 74 mg/dl this morning. Please consider decreasing Levemir to 16 units QHS. Insulin - Meal Coverage: Post prandial glucose is consistently elevated. If steroids are continued, please consider ordering Novolog 4 units TID with meals for meal coverage.  Thanks, Cheryl PennerMarie Chalisa Kobler, RN, MSN, CDE Diabetes Coordinator Inpatient Diabetes Program [phone] (Team Pager from 8am to 5pm)

## 2016-09-28 NOTE — Discharge Summary (Signed)
Physician Discharge Summary  Cheryl [facility] UJW:[id number]:[id number] DOB: [birth year] DOA: 09/17/2016  PCP: Noni SaupeEDDING II,JOHN F., MD  Admit date: 09/17/2016 Discharge date: 09/28/2016  Admitted From:Home Disposition:Inpatient rehab  Recommendations for Outpatient Follow-up:  1. Follow up with PCP in 1-2 weeks 2. Please obtain BMP/CBC in one week   Home Health: Dc to rehab Equipment/Devices:oxygen Discharge Condition:stable CODE STATUS:full Diet recommendation: Carb modified heart healthy diet.  Brief/Interim Summary:Cheryl Wheeler is an 50 y.o. female past medical history of COPD oxygen dependent, chronic granulomatosis disease, bipolar disorder who originally presented to [facility] with dyspnea, fever, hypoxia and hyponatremia remain on BiPAP and was transferred to [facility]. Significant Events: 11/12 - Admitted to [facility] w/ acute on chronic COPD exacerbation and hypoosmolar hyponatremia 11/14 - Started on vancomycin and cefepime 11/16 - Transferred to [facility] for possible tracheostomy due to continuous need for BiPAP and increased oxygen requirement.  11/17 - Intubated. CT chest negative for PE.  11/18 - Bronch RML, no DAH, some erthyema blood, BAL sent, tolerated 11/24 - Extubated  11/26 - [facility] primary  11/30 - Transfer to inpatient rehab  12/08 - To [facility] with hypoxia, add Vfend 12/14 - Transfer to SDU 12/15 - [facility] assumed care   # Acute respiratory failure with hypoxemia with B/L pulmonary infiltrates, With positive RSV on 08/29/2016: -Patient was intermittently on BiPAP. Now weaning to nasal cannula, requiring only 3 L of oxygen.  oxygen saturation acceptable. Patient had bronchoscopy which was positive for RSV virus as well as fungus which is growing en route and culture. Evaluated by infectious disease team. Recommended voriconazole and Bactrim. I advised patient to follow-up with infectious disease outpatient. Patient also has follow-up appointment with  pulmonologist. -BAL showed moderate fungus speciation pending.  #Recurrent: Infection/chronic granulomatosis disease: Prednisone with tapering dose. As per pulmonologist note, recommended to taper prednisone slowly. Advised to follow-up with infectious disease.  #COPD: Stable today. Weaning steroids slowly.Marland Kitchen.  #Chronic grade 1 diastolic heart failure: Euvolemic on physical exam. On oral Lasix.  #Obesity hypoventilation syndrome (HCC) Bipap at night PRN.  #Essential hypertension Blood pressure seems to be stable, will continue to monitor. Continue current medications.  #Hypoalbuminemia due to protein-calorie malnutrition [facility](HCC): Dietary evaluation. Encourage oral intake.   #History of bipolar disorder: Resume home medication. Advised to follow-up with PCP outpatient.  #Steroid-induced hyperglycemia: -Has elevated blood sugar level. Diabetic educator evaluated the patient. Insulin dose adjusted as below. Advised to monitor blood sugar level.  Today, patient reported she is feeling much better. She said she is ready to go to rehabilitation today. Denied fever, chills, nausea, vomiting, chest pain, shortness of breath. Her oxygen saturation acceptable in 2-3 L of oxygen. Discussed with the care management team, nursing staff and rehabilitation. Patient is medically stable to transfer her care to inpatient rehabilitation today. I advised patient to follow-up with her PCP, pulmonologist, infectious disease as an outpatient. Patient verbalized understanding.  Discharge Diagnoses:  Active Problems:   Obesity hypoventilation syndrome (HCC)   Essential hypertension   Hypoalbuminemia due to protein-calorie malnutrition (HCC)   Benign essential HTN   Type 2 diabetes mellitus with peripheral neuropathy (HCC)   Chronic granulomatous disease (HCC)   Acute respiratory failure with hypoxemia (HCC)   Fungal infection   Invasive aspergillosis [facility](HCC)    Discharge Instructions  Discharge  Instructions    Call MD for:  difficulty breathing, headache or visual disturbances    Complete by:  As directed    Call MD for:  hives  Complete by:  As directed    Call MD for:  persistant dizziness or light-headedness    Complete by:  As directed    Call MD for:  persistant nausea and vomiting    Complete by:  As directed    Call MD for:  severe uncontrolled pain    Complete by:  As directed    Call MD for:  temperature >100.4    Complete by:  As directed    Diet - low sodium heart healthy    Complete by:  As directed    Diet Carb Modified    Complete by:  As directed    For home use only DME oxygen    Complete by:  As directed    Mode or (Route):  Nasal cannula   Liters per Minute:  3   Oxygen conserving device:  Yes   Oxygen delivery system:  Gas   Increase activity slowly    Complete by:  As directed      Allergies as of 09/28/2016      Reactions   Fentanyl Shortness Of Breath   Meperidine Other (See Comments)   chest pain-"feels like squeezing heart"   Azithromycin Other (See Comments)   VRE   Codeine Nausea Only   Levofloxacin Other (See Comments)   Unknown   Other Other (See Comments)   BLACK STICHES=BODY REJECTS THEM   Penicillins Rash   Sulfa Antibiotics Rash   Tolerates Bactrim      Medication List    STOP taking these medications   predniSONE 10 MG (21) Tbpk tablet Commonly known as:  STERAPRED UNI-PAK 21 TAB Replaced by:  predniSONE 20 MG tablet     TAKE these medications   acetaminophen 325 MG tablet Commonly known as:  TYLENOL Take 2 tablets (650 mg total) by mouth every 6 (six) hours as needed for mild pain or moderate pain.   albuterol 108 (90 Base) MCG/ACT inhaler Commonly known as:  PROVENTIL HFA;VENTOLIN HFA Inhale 2 puffs into the lungs every 4 (four) hours as needed for wheezing or shortness of breath.   amLODipine 10 MG tablet Commonly known as:  NORVASC Take 1 tablet (10 mg total) by mouth daily. Start taking on:  09/29/2016    atorvastatin 10 MG tablet Commonly known as:  LIPITOR Take 10 mg by mouth daily.   budesonide-formoterol 160-4.5 MCG/ACT inhaler Commonly known as:  SYMBICORT Inhale 2 puffs into the lungs 2 (two) times daily.   furosemide 20 MG tablet Commonly known as:  LASIX Take 2 tablets (40 mg total) by mouth 2 (two) times daily. What changed:  how much to take  when to take this   Gerhardt's butt cream Crea Apply 1 application topically 3 (three) times daily.   guaiFENesin-dextromethorphan 100-10 MG/5ML syrup Commonly known as:  ROBITUSSIN DM Take 5 mLs by mouth every 4 (four) hours as needed for cough.   hydrOXYzine 25 MG capsule Commonly known as:  VISTARIL Take 25 mg by mouth every 6 (six) hours as needed for anxiety.   insulin aspart 100 UNIT/ML injection Commonly known as:  novoLOG Inject 0-20 Units into the skin 3 (three) times daily with meals.   insulin aspart 100 UNIT/ML injection Commonly known as:  novoLOG Inject 4 Units into the skin 3 (three) times daily with meals.   insulin glargine 100 UNIT/ML injection Commonly known as:  LANTUS Inject 0.16 mLs (16 Units total) into the skin at bedtime. What changed:  how much to take  when to take this   ipratropium 0.02 % nebulizer solution Commonly known as:  ATROVENT Take 0.5 mg by nebulization 3 (three) times daily.   ipratropium-albuterol 0.5-2.5 (3) MG/3ML Soln Commonly known as:  DUONEB Take 3 mLs by nebulization every 4 (four) hours as needed.   labetalol 300 MG tablet Commonly known as:  NORMODYNE Take 1 tablet (300 mg total) by mouth 3 (three) times daily.   LORazepam 0.5 MG tablet Commonly known as:  ATIVAN Take 0.5 mg by mouth every 8 (eight) hours as needed for anxiety.   mirtazapine 30 MG tablet Commonly known as:  REMERON Take 45 mg by mouth at bedtime.   morphine 15 MG tablet Commonly known as:  MSIR Take 1 tablet (15 mg total) by mouth every 12 (twelve) hours as needed for severe  pain. What changed:  when to take this  reasons to take this   omeprazole 40 MG capsule Commonly known as:  PRILOSEC Take 40 mg by mouth daily.   predniSONE 20 MG tablet Commonly known as:  DELTASONE Take 1 tablet (20 mg total) by mouth daily with breakfast. For 5 days, then taper to 10 mg for 5 days, then 5 mg daily for 5 days and stop. Replaces:  predniSONE 10 MG (21) Tbpk tablet   risperiDONE 2 MG tablet Commonly known as:  RISPERDAL Take 2 mg by mouth at bedtime.   sulfamethoxazole-trimethoprim 800-160 MG tablet Commonly known as:  BACTRIM DS,SEPTRA DS Take 1 tablet by mouth 2 (two) times daily.   valproic acid 250 MG capsule Commonly known as:  DEPAKENE Take 2 capsules (500 mg total) by mouth 2 (two) times daily.   voriconazole 50 MG tablet Commonly known as:  VFEND Take 5 tablets (250 mg total) by mouth every 12 (twelve) hours.            Durable Medical Equipment        Start     Ordered   09/28/16 0000  For home use only DME oxygen    Question Answer Comment  Mode or (Route) Nasal cannula   Liters per Minute 3   Oxygen conserving device Yes   Oxygen delivery system Gas      09/28/16 1114     Follow-up Information    Bevelyn Ngo, NP Follow up on 10/13/2016.   Specialty:  Pulmonary Disease Why:  3:00pm  Contact information: [address] [place] [address] [phone]        Acey Lav, MD. Schedule an appointment as soon as possible for a visit in 2 week(s).   Specialty:  Infectious Diseases Contact information: [address] [place] [address] [phone]        [facility] Valrie Hart., MD. Schedule an appointment as soon as possible for a visit in 1 week(s).   Specialty:  Family Medicine Contact information: [address] [place] [address] [phone]          Allergies  Allergen Reactions  . Fentanyl Shortness Of Breath  . Meperidine Other (See Comments)    chest pain-"feels like  squeezing heart"  . Azithromycin Other (See Comments)    VRE  . Codeine Nausea Only  . Levofloxacin Other (See Comments)    Unknown  . Other Other (See Comments)    BLACK STICHES=BODY REJECTS THEM  . Penicillins Rash  . Sulfa Antibiotics Rash    Tolerates Bactrim    Consultations: Pulmonologist, infectious disease  Procedures/Studies: BiPAP, bronchoscopy. -BX  duplex on 08/26/2016 was negative for DVT. Bronchoscopy on 08/28/2017 right middle lobe showed no diffuse alveolar hemorrhage some erythema. CT chest on 09/15/2016 show consolidation of both upper lobe and left lower lobe, with increasing cervical mediastinal lymph nodes and which are  borderline Large  Subjective: Patient was seen and examined at bedside. Patient reported doing well. She wants to go to inpatient rehabilitation today. Denied shortness of breath, chest pain, nausea, vomiting, fever or cough.   Discharge Exam: Vitals:   09/28/16 0811 09/28/16 0900  BP: (!) 152/68 (!) 151/68  Pulse: 71 77  Resp: 11 (!) 26  Temp: 98.1 F (36.7 C)    Vitals:   09/28/16 0500 09/28/16 0700 09/28/16 0811 09/28/16 0900  BP:  (!) 147/69 (!) 152/68 (!) 151/68  Pulse:  68 71 77  Resp:  18 11 (!) 26  Temp:   98.1 F (36.7 C)   TempSrc:   Oral   SpO2:  (!) 89% 98% 97%  Weight: 82.7 kg (182 lb 5.1 oz)     Height:        General: Pt is alert, awake, not in acute distress.Speaking full sentences. Cardiovascular: RRR, S1/S2 +, no rubs, no gallops Respiratory: CTA bilaterally, no wheezing, no rhonchi Abdominal: Soft, NT, ND, bowel sounds + Extremities: no edema, no cyanosis Neurologic: Alert, awake, oriented 3. Nonfocal neurologic exam.   The results of significant diagnostics from this hospitalization (including imaging, microbiology, ancillary and laboratory) are listed below for reference.     Microbiology: No results found for this or any previous visit (from the past 240 hour(s)).   Labs: BNP (last 3  results)  Recent Labs  08/26/16 1804 09/17/16 2222  BNP 168.8* 36.2   Basic Metabolic Panel:  Recent Labs Lab 09/22/16 0235 09/23/16 0337 09/24/16 0333 09/25/16 0243  NA 136 134* 135 134*  K 4.1 3.8 3.7 3.6  CL 87* 83* 82* 82*  CO2 39* 41* 46* 39*  GLUCOSE 169* 160* 191* 237*  BUN 10 9 8 8   CREATININE 0.50 0.40* 0.35* 0.38*  CALCIUM 8.9 8.8* 9.0 9.1  MG 1.6*  --  1.9  --   PHOS 4.0  --  3.5  --    Liver Function Tests:  Recent Labs Lab 09/22/16 0235 09/25/16 0243  AST 14* 16  ALT 11* 12*  ALKPHOS 39 37*  BILITOT 0.4 0.5  PROT 5.6* 5.7*  ALBUMIN 2.4* 2.7*   No results for input(s): LIPASE, AMYLASE in the last 168 hours. No results for input(s): AMMONIA in the last 168 hours. CBC:  Recent Labs Lab 09/22/16 0235 09/23/16 0337 09/24/16 0333 09/25/16 0243  WBC 6.4 5.7 5.0 5.3  HGB 9.1* 9.2* 9.5* 9.6*  HCT 30.9* 30.9* 31.5* 31.9*  MCV 87.0 85.6 85.6 86.4  PLT 253 250 254 266   Cardiac Enzymes: No results for input(s): CKTOTAL, CKMB, CKMBINDEX, TROPONINI in the last 168 hours. BNP: Invalid input(s): POCBNP CBG:  Recent Labs Lab 09/27/16 0739 09/27/16 1157 09/27/16 1726 09/27/16 2143 09/28/16 0810  GLUCAP 105* 204* 287* 370* 74   D-Dimer No results for input(s): DDIMER in the last 72 hours. Hgb A1c No results for input(s): HGBA1C in the last 72 hours. Lipid Profile No results for input(s): CHOL, HDL, LDLCALC, TRIG, CHOLHDL, LDLDIRECT in the last 72 hours. Thyroid function studies No results for input(s): TSH, T4TOTAL, T3FREE, THYROIDAB in the last 72 hours.  Invalid input(s): FREET3 Anemia work up No results for input(s): VITAMINB12, FOLATE, FERRITIN, TIBC, IRON, RETICCTPCT  in the last 72 hours. Urinalysis    Component Value Date/Time   COLORURINE YELLOW 09/15/2016 1743   APPEARANCEUR HAZY (A) 09/15/2016 1743   LABSPEC 1.017 09/15/2016 1743   PHURINE 7.0 09/15/2016 1743   GLUCOSEU NEGATIVE 09/15/2016 1743   HGBUR NEGATIVE 09/15/2016  1743   BILIRUBINUR NEGATIVE 09/15/2016 1743   KETONESUR NEGATIVE 09/15/2016 1743   PROTEINUR NEGATIVE 09/15/2016 1743   NITRITE NEGATIVE 09/15/2016 1743   LEUKOCYTESUR SMALL (A) 09/15/2016 1743   Sepsis Labs Invalid input(s): PROCALCITONIN,  WBC,  LACTICIDVEN Microbiology No results found for this or any previous visit (from the past 240 hour(s)).   Time coordinating discharge: Over 30 minutes  SIGNED:   Maxie Barb, MD  [facility] 09/28/2016, 11:15 AM  If 7PM-7AM, please contact night-coverage [email or web address] Password [email or web address]

## 2016-09-28 NOTE — Progress Notes (Signed)
[facility] Patient will be going to room 11 when cleaning is complete.

## 2016-09-28 NOTE — H&P (Signed)
Physical Medicine and Rehabilitation Admission H&P    Chief complaint: Weakness  HPI: Cheryl Baugh Rogersis a 50 y.o.right handed femalewith history of advanced COPD on 2-4L of oxygen as well as BiPAPat home, chronic granulomatous disease and maintained on Bactrim for prophylaxis followed by immunologist as outpatient at [facility], chronic diastolic congestive heart failure, diabetes mellitus, hypertension, bipolar disorder, chronic back pain maintained on MSIR 15 mg twice a day. Per chart review patient lives with spouse. One level homewith 5 steps to entry. Modified independent with rolling walker for household distances prior to admission. Uses a wheelchair outside of the home. Presented to [facility] 08/22/2016 for acute on chronic respiratory failure with low-grade fever and hypoxic in the 70s and required intubation. Findings of mild hyponatremia 128. Started on Solu-Medrol and nebulizer treatments. Transferred to [facility] 08/26/2016 for ongoing medical care. Chest x-ray was negative for acute abnormality. CT angiogram of the chest negative for pulmonary embolibut concerning for pneumonia. RSV-B positive. Completed a course of broad-spectrum antibiotics.Pulmonary care service follow-up remain intubated through 09/03/2016.Prednisone Dosepak taper as directed. Physicaland occupationaltherapy evaluationscompleted 09/07/2016 with recommendations of physical medicine rehabilitation consult.Patient was admitted for a comprehensive rehabilitation program 09/09/2016. She was able to propel her wheelchair with moderate cueing as well as ambulate very short distances due to fatigue. A recent CT the chest 09/15/2016 at the discretion of pulmonary services for follow-up of suspect HCAP showed consolidation in upper lobes and left lower lobe and advised to continue antibiotic therapy that she had remained on with cefepime and vancomycin times an 8 day course and completed. On 09/17/2016  with increasing shortness of breath oxygen saturations dipping into the 70s and maintained on Bipap. Chest x-ray showed no new abnormalities. She denied any chest pain. Pulmonary service follow-up and felt patient should be discharged to acute care services to address pulmonary issues 09/17/2016. Placed on Solu-Medrol and tapered. Latest chest x-ray 09/23/2016 showing stable bilateral lung opacities. Presently maintained on voriconazole secondary to acute respiratory failure. Tracheal aspirate growing moderate fungus awaiting speciation. Placed on subcutaneous Lovenox for DVT prophylaxis 09/24/2016. Patient again admitted to resume full inpatient rehabilitation therapies.  Review of Systems  Constitutional: Negative for chills and fever.  HENT: Negative for hearing loss and tinnitus.   Eyes: Negative for blurred vision, double vision and photophobia.  Respiratory: Positive for shortness of breath.   Cardiovascular: Positive for leg swelling. Negative for chest pain and palpitations.  Gastrointestinal: Positive for constipation. Negative for nausea and vomiting.       GERD  Genitourinary: Negative for dysuria, flank pain and hematuria.  Musculoskeletal: Positive for joint pain and myalgias.  Skin: Negative for rash.  Neurological: Positive for weakness. Negative for seizures and loss of consciousness.  Psychiatric/Behavioral:       Anxiety  All other systems reviewed and are negative.  Past Medical History:  Diagnosis Date  . Anxiety   . Bipolar disorder ([place])   . COPD (chronic obstructive pulmonary disease) ([place])   . GERD (gastroesophageal reflux disease)   . Hypertension    History reviewed. No pertinent surgical history. History reviewed. No pertinent family history. Social History:  reports that she quit smoking about 3 months ago. Her smoking use included Cigarettes. She has never used smokeless tobacco. She reports that she does not drink alcohol or use drugs. Allergies:  Allergies   Allergen Reactions  . Fentanyl Shortness Of Breath  . Meperidine Other (See Comments)    chest pain-"feels like squeezing heart"  .  Azithromycin Other (See Comments)    VRE  . Codeine Nausea Only  . Levofloxacin Other (See Comments)    Unknown  . Other Other (See Comments)    BLACK STICHES=BODY REJECTS THEM  . Penicillins Rash  . Sulfa Antibiotics Rash    Tolerates Bactrim   Medications Prior to Admission  Medication Sig Dispense Refill  . acetaminophen (TYLENOL) 325 MG tablet Take 2 tablets (650 mg total) by mouth every 6 (six) hours as needed for mild pain or moderate pain.    Marland Kitchen albuterol (PROVENTIL HFA;VENTOLIN HFA) 108 (90 Base) MCG/ACT inhaler Inhale 2 puffs into the lungs every 4 (four) hours as needed for wheezing or shortness of breath.    Marland Kitchen atorvastatin (LIPITOR) 10 MG tablet Take 10 mg by mouth daily.    . budesonide-formoterol (SYMBICORT) 160-4.5 MCG/ACT inhaler Inhale 2 puffs into the lungs 2 (two) times daily.    . furosemide (LASIX) 20 MG tablet Take 20 mg by mouth daily.    Marland Kitchen guaiFENesin-dextromethorphan (ROBITUSSIN DM) 100-10 MG/5ML syrup Take 5 mLs by mouth every 4 (four) hours as needed for cough. 118 mL 0  . hydrOXYzine (VISTARIL) 25 MG capsule Take 25 mg by mouth every 6 (six) hours as needed for anxiety.     . insulin glargine (LANTUS) 100 UNIT/ML injection Inject 0.14 mLs (14 Units total) into the skin daily. 10 mL 11  . ipratropium (ATROVENT) 0.02 % nebulizer solution Take 0.5 mg by nebulization 3 (three) times daily.     Marland Kitchen ipratropium-albuterol (DUONEB) 0.5-2.5 (3) MG/3ML SOLN Take 3 mLs by nebulization every 4 (four) hours as needed.     . labetalol (NORMODYNE) 300 MG tablet Take 1 tablet (300 mg total) by mouth 3 (three) times daily.    Marland Kitchen LORazepam (ATIVAN) 0.5 MG tablet Take 0.5 mg by mouth every 8 (eight) hours as needed for anxiety.    . mirtazapine (REMERON) 30 MG tablet Take 45 mg by mouth at bedtime.    Marland Kitchen morphine (MSIR) 15 MG tablet Take 15 mg by mouth  2 (two) times daily.    Marland Kitchen omeprazole (PRILOSEC) 40 MG capsule Take 40 mg by mouth daily.    . predniSONE (STERAPRED UNI-PAK 21 TAB) 10 MG (21) TBPK tablet Take 1 tablet (10 mg total) by mouth taper from 4 doses each day to 1 dose and stop.    Marland Kitchen risperiDONE (RISPERDAL) 2 MG tablet Take 2 mg by mouth at bedtime.     . sulfamethoxazole-trimethoprim (BACTRIM DS,SEPTRA DS) 800-160 MG tablet Take 1 tablet by mouth 2 (two) times daily.    Marland Kitchen valproic acid (DEPAKENE) 250 MG capsule Take 2 capsules (500 mg total) by mouth 2 (two) times daily.      Home: Home Living Family/patient expects to be discharged to:: Inpatient rehab Living Arrangements: Spouse/significant other Available Help at Discharge: Family, Available 24 hours/day Type of Home: Mobile home Home Access: Stairs to enter Entrance Stairs-Number of Steps: 5  Entrance Stairs-Rails: Left Home Layout: One level Bathroom Shower/Tub: Multimedia programmer: Standard Home Equipment: Environmental consultant - 2 wheels, Bedside commode, Shower seat, Radio producer - single point, Wheelchair - manual  Lives With: Spouse   Functional History: Prior Function Level of Independence: Needs assistance Gait / Transfers Assistance Needed: Modified independent with rolling walker for household distances. Uses w/c if out ADL's / Homemaking Assistance Needed: mod independent with basic ADL. Husband cooked/cleaned. Became SOB with ADL at times. Used w/c for community distances at baseline  Functional Status:  Mobility:  Bed Mobility Overal bed mobility: Needs Assistance Sit to supine: Mod assist General bed mobility comments: Needed assist to lift LEs into the bed. Transfers Overall transfer level: Needs assistance Equipment used: Rolling walker (2 wheeled) Transfers: Stand Pivot Transfers Sit to Stand: Min assist Stand pivot transfers: Min assist General transfer comment: Patient required cuing for hand placement on arm rests when rising, after patient had a failed  attempt to power up with one hand on RW. Patient required min assist for trunk elevation and steadying upon standing.  Ambulation/Gait Ambulation/Gait assistance: Min assist, +2 safety/equipment (lines/leads management ) Ambulation Distance (Feet): 15 Feet Assistive device: Rolling walker (2 wheeled) Gait Pattern/deviations: Step-to pattern, Decreased stride length, Trunk flexed General Gait Details: Patient required min assit steadying lines/leads managment. Cues for hand placement.  Gait velocity: decreased  Gait velocity interpretation: Below normal speed for age/gender    ADL: ADL Overall ADL's : Needs assistance/impaired Eating/Feeding: Independent Grooming: Wash/dry hands, Wash/dry face, Set up, Sitting Upper Body Bathing: Sitting, Minimal assistance Lower Body Bathing: Moderate assistance, Sit to/from stand Upper Body Dressing : Minimal assistance, Sitting Lower Body Dressing: Maximal assistance, Sit to/from stand Toilet Transfer: Minimal assistance, RW Toileting- Clothing Manipulation and Hygiene: Moderate assistance, Sit to/from stand Toileting - Clothing Manipulation Details (indicate cue type and reason): simulated Functional mobility during ADLs: Minimal assistance General ADL Comments: Pt used RW for support in static standing and with functional transfers.  Only able to tolerate standing statically for 30-45 seconds during 2 intervals before needing to sit secondary to fatigue.  Oxygen sats fluctuated from 85-93% on 5Ls during session.  Decreased ability to reach either foot for simulated dressing.  Reports that spouse assists with donning socks and shoes at home.    Cognition: Cognition Overall Cognitive Status: Within Functional Limits for tasks assessed Orientation Level: Oriented X4 Cognition Arousal/Alertness: Awake/alert Behavior During Therapy: WFL for tasks assessed/performed, Flat affect Overall Cognitive Status: Within Functional Limits for tasks  assessed  Physical Exam: Blood pressure (!) 150/72, pulse 66, temperature 97.8 F (36.6 C), temperature source Oral, resp. rate 19, height 4' 11.5" (1.511 m), weight 81 kg (178 lb 9.2 oz), SpO2 97 %. Physical Exam  Vitals reviewed. Constitutional: She is oriented to person, place, and time. She appears well-developed and well-nourished.  HENT:  Head: Normocephalic and atraumatic.  Eyes: Conjunctivae and EOM are normal. Left eye exhibits no discharge.  Neck: Normal range of motion. Neck supple. No thyromegaly present.  Cardiovascular: Normal rate, regular rhythm and normal heart sounds.   Respiratory: She has no wheezes.  Decreased breath sounds at the bases with shallow respirations but clear to auscultation +[place]  GI: Soft. Bowel sounds are normal. She exhibits no distension. There is no tenderness.  Musculoskeletal: She exhibits no edema or tenderness.  Neurological: She is alert and oriented to person, place, and time.  Follows commands Motor: 4-4+/5 throughout  Skin: Skin is warm and dry.  Psychiatric: She has a normal mood and affect. Her behavior is normal.   Results for orders placed or performed during the hospital encounter of 09/17/16 (from the past 48 hour(s))  Glucose, capillary     Status: Abnormal   Collection Time: 09/21/16 11:25 AM  Result Value Ref Range   Glucose-Capillary 182 (H) 65 - 99 mg/dL   Comment 1 Notify RN   Glucose, capillary     Status: Abnormal   Collection Time: 09/21/16  3:20 PM  Result Value Ref Range   Glucose-Capillary 346 (H) 65 - 99 mg/dL  Comment 1 Notify RN   Glucose, capillary     Status: Abnormal   Collection Time: 09/21/16  7:18 PM  Result Value Ref Range   Glucose-Capillary 126 (H) 65 - 99 mg/dL   Comment 1 Capillary Specimen    Comment 2 Notify RN    Comment 3 Document in Chart   Glucose, capillary     Status: Abnormal   Collection Time: 09/21/16 11:31 PM  Result Value Ref Range   Glucose-Capillary 271 (H) 65 - 99 mg/dL    Comment 1 Capillary Specimen    Comment 2 Notify RN    Comment 3 Document in Chart   CBC     Status: Abnormal   Collection Time: 09/22/16  2:35 AM  Result Value Ref Range   WBC 6.4 4.0 - 10.5 K/uL   RBC 3.55 (L) 3.87 - 5.11 MIL/uL   Hemoglobin 9.1 (L) 12.0 - 15.0 g/dL   HCT 30.9 (L) 36.0 - 46.0 %   MCV 87.0 78.0 - 100.0 fL   MCH 25.6 (L) 26.0 - 34.0 pg   MCHC 29.4 (L) 30.0 - 36.0 g/dL   RDW 18.3 (H) 11.5 - 15.5 %   Platelets 253 150 - 400 K/uL  Basic metabolic panel     Status: Abnormal   Collection Time: 09/22/16  2:35 AM  Result Value Ref Range   Sodium 136 135 - 145 mmol/L   Potassium 4.1 3.5 - 5.1 mmol/L   Chloride 87 (L) 101 - 111 mmol/L   CO2 39 (H) 22 - 32 mmol/L   Glucose, Bld 169 (H) 65 - 99 mg/dL   BUN 10 6 - 20 mg/dL   Creatinine, Ser 0.50 0.44 - 1.00 mg/dL   Calcium 8.9 8.9 - 10.3 mg/dL   GFR calc non Af Amer >60 >60 mL/min   GFR calc Af Amer >60 >60 mL/min    Comment: (NOTE) The eGFR has been calculated using the CKD EPI equation. This calculation has not been validated in all clinical situations. eGFR's persistently <60 mL/min signify possible Chronic Kidney Disease.    Anion gap 10 5 - 15  Magnesium     Status: Abnormal   Collection Time: 09/22/16  2:35 AM  Result Value Ref Range   Magnesium 1.6 (L) 1.7 - 2.4 mg/dL  Phosphorus     Status: None   Collection Time: 09/22/16  2:35 AM  Result Value Ref Range   Phosphorus 4.0 2.5 - 4.6 mg/dL  Hepatic function panel     Status: Abnormal   Collection Time: 09/22/16  2:35 AM  Result Value Ref Range   Total Protein 5.6 (L) 6.5 - 8.1 g/dL   Albumin 2.4 (L) 3.5 - 5.0 g/dL   AST 14 (L) 15 - 41 U/L   ALT 11 (L) 14 - 54 U/L   Alkaline Phosphatase 39 38 - 126 U/L   Total Bilirubin 0.4 0.3 - 1.2 mg/dL   Bilirubin, Direct <0.1 (L) 0.1 - 0.5 mg/dL   Indirect Bilirubin NOT CALCULATED 0.3 - 0.9 mg/dL  Glucose, capillary     Status: Abnormal   Collection Time: 09/22/16  3:47 AM  Result Value Ref Range    Glucose-Capillary 138 (H) 65 - 99 mg/dL   Comment 1 Capillary Specimen    Comment 2 Notify RN    Comment 3 Document in Chart   Glucose, capillary     Status: Abnormal   Collection Time: 09/22/16  7:54 AM  Result Value Ref Range   Glucose-Capillary  155 (H) 65 - 99 mg/dL   Comment 1 Notify RN   Glucose, capillary     Status: Abnormal   Collection Time: 09/22/16 11:50 AM  Result Value Ref Range   Glucose-Capillary 360 (H) 65 - 99 mg/dL   Comment 1 Arterial Specimen    Comment 2 Notify RN   Glucose, capillary     Status: Abnormal   Collection Time: 09/22/16  4:39 PM  Result Value Ref Range   Glucose-Capillary 178 (H) 65 - 99 mg/dL   Comment 1 Notify RN   Glucose, capillary     Status: Abnormal   Collection Time: 09/22/16  7:44 PM  Result Value Ref Range   Glucose-Capillary 269 (H) 65 - 99 mg/dL   Comment 1 Capillary Specimen    Comment 2 Notify RN    Comment 3 Document in Chart   Glucose, capillary     Status: Abnormal   Collection Time: 09/23/16 12:06 AM  Result Value Ref Range   Glucose-Capillary 183 (H) 65 - 99 mg/dL   Comment 1 Capillary Specimen    Comment 2 Notify RN    Comment 3 Document in Chart   Basic metabolic panel     Status: Abnormal   Collection Time: 09/23/16  3:37 AM  Result Value Ref Range   Sodium 134 (L) 135 - 145 mmol/L   Potassium 3.8 3.5 - 5.1 mmol/L   Chloride 83 (L) 101 - 111 mmol/L   CO2 41 (H) 22 - 32 mmol/L   Glucose, Bld 160 (H) 65 - 99 mg/dL   BUN 9 6 - 20 mg/dL   Creatinine, Ser 0.40 (L) 0.44 - 1.00 mg/dL   Calcium 8.8 (L) 8.9 - 10.3 mg/dL   GFR calc non Af Amer >60 >60 mL/min   GFR calc Af Amer >60 >60 mL/min    Comment: (NOTE) The eGFR has been calculated using the CKD EPI equation. This calculation has not been validated in all clinical situations. eGFR's persistently <60 mL/min signify possible Chronic Kidney Disease.    Anion gap 10 5 - 15  CBC     Status: Abnormal   Collection Time: 09/23/16  3:37 AM  Result Value Ref Range    WBC 5.7 4.0 - 10.5 K/uL   RBC 3.61 (L) 3.87 - 5.11 MIL/uL   Hemoglobin 9.2 (L) 12.0 - 15.0 g/dL   HCT 30.9 (L) 36.0 - 46.0 %   MCV 85.6 78.0 - 100.0 fL   MCH 25.5 (L) 26.0 - 34.0 pg   MCHC 29.8 (L) 30.0 - 36.0 g/dL   RDW 17.8 (H) 11.5 - 15.5 %   Platelets 250 150 - 400 K/uL  Glucose, capillary     Status: Abnormal   Collection Time: 09/23/16  4:01 AM  Result Value Ref Range   Glucose-Capillary 167 (H) 65 - 99 mg/dL   Comment 1 Capillary Specimen    Comment 2 Notify RN    Comment 3 Document in Chart   Glucose, capillary     Status: Abnormal   Collection Time: 09/23/16  7:32 AM  Result Value Ref Range   Glucose-Capillary 173 (H) 65 - 99 mg/dL   Comment 1 Capillary Specimen    Comment 2 Notify RN    Dg Chest Port 1 View  Result Date: 09/23/2016 CLINICAL DATA:  Respiratory failure, shortness of breath. EXAM: PORTABLE CHEST 1 VIEW COMPARISON:  Radiograph of September 21, 2016. FINDINGS: The heart size and mediastinal contours are within normal limits. No pneumothorax or  pleural effusion is noted. Stable patchy opacities are noted in both upper lobes in left lung base concerning for pneumonia. The visualized skeletal structures are unremarkable. IMPRESSION: Stable bilateral lung opacities are noted concerning for pneumonia. Electronically Signed   By: Marijo Conception, M.D.   On: 09/23/2016 07:50       Medical Problem List and Plan: 1.  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 Continue BiPAP and oxygen therapy. Taper prednisone as directed with slow wean 2.  DVT Prophylaxis/Anticoagulation: Subcutaneous Lovenox. Monitor platelet counts and any signs of bleeding Recent vascular studies negative. 3. Pain Management/chronic back pain: MSIR 15 mg twice a day as needed 4. Mood/bipolar disorder: Remeron 45 mg daily at bedtime, valproic acid 500 mg twice a day, Ativan 0.5 mg 3 times a day as  needed 5. Neuropsych: This patient is capable of making decisions on her own behalf. 6. Skin/Wound Care: Routine skin checks 7. Fluids/Electrolytes/Nutrition: Routine I&O with follow-up chemistries 8. Hypertension. Labetalol 300 mg 3 times a day, Norvasc 10 mg daily. Monitor with increased mobility 9. Acute on chronic diastolic congestive heart failure. Lasix 40 mg twice a day. Monitor for any signs of fluid overload 10. Diabetes mellitus with peripheral neuropathy. Hemoglobin A1c 6.5. Lantus insulin 16 units daily. Blood sugars remain variable while on prednisone therapy. Check blood sugars before meals and at bedtime. 11. Chronic granulomatous disease. Patient on chronic Bactrim lifelong prior to admission and continue 12. Hyperlipidemia. Lipitor 13. GERD. Protonix  Post Admission Physician Evaluation: 1. Preadmission assessment reviewed and changes made below. 2. Functional deficits secondary  to debility. 3. Patient is admitted to receive collaborative, interdisciplinary care between the physiatrist, rehab nursing staff, and therapy team. 4. Patient's level of medical complexity and substantial therapy needs in context of that medical necessity cannot be provided at a lesser intensity of care such as a SNF. 5. Patient has experienced substantial functional loss from his/her baseline which was documented above under the "Functional History" and "Functional Status" headings.  Judging by the patient's diagnosis, physical exam, and functional history, the patient has potential for functional progress which will result in measurable gains while on inpatient rehab.  These gains will be of substantial and practical use upon discharge  in facilitating mobility and self-care at the household level. 6. Physiatrist will provide 24 hour management of medical needs as well as oversight of the therapy plan/treatment and provide guidance as appropriate regarding the interaction of the two. 7. The Preadmission  Screening has been reviewed and patient status is unchanged unless otherwise stated above. 8. 24 hour rehab nursing will assist with safety, skin/wound care, disease management, pain management and patient education  and help integrate therapy concepts, techniques,education, etc. 9. PT will assess and treat for/with: Lower extremity strength, range of motion, stamina, balance, functional mobility, safety, adaptive techniques and equipment, coping skills, pain control, education.   Goals are: Mod I/Supervision. 10. OT will assess and treat for/with: ADL's, functional mobility, safety, upper extremity strength, adaptive techniques and equipment, ego support, and community reintegration.   Goals are: Mod I/Supervision. Therapy may proceed with showering this patient. 11. Case Management and Social Worker will assess and treat for psychological issues and discharge planning. 12. Team conference will be held weekly to assess progress toward goals and to determine barriers to discharge. 13. Patient will receive at least 3 hours of therapy per day at least 5 days per week. 14. ELOS: 10-14 days.  15. Prognosis:  good   ANGIULLI,DANIEL J., PA-C 09/23/2016  Delice Lesch, MD, [facility]

## 2016-09-28 NOTE — Progress Notes (Signed)
Patient arrived from [id number] with RN and patient belongings. Patient on 3L O2 [place]. Patient reoriented to room and rehab process. All questions answered and patient ready to resume therapy.

## 2016-09-28 NOTE — Progress Notes (Signed)
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 right handed female with history of advanced COPD on 2-4 L of oxygen as well as BiPAP at home. Per chart review patient lives with spouse. One level home with 5 steps to entry. Modified independent with rolling walker for household distances prior to admission. Uses a wheelchair outside of the home. Presented to [facility] 08/22/2016 for acute on chronic respiratory failure with low-grade fever and hypoxic in the 70s and required intubation. Findings of mild hyponatremia 128. Started on Solu-Medrol and nebulizer treatments. Transferred to [facility] 08/26/2016 for ongoing medical care. Chest x-ray was negative for acute abnormality. CT angiogram of the chest negative for pulmonary emboli. Maintain on broad-spectrum antibiotics. Pulmonary care service follow-up remain intubated through 09/03/2016. Physical therapy evaluation completed 09/07/2016 with recommendations of physical medicine rehabilitation consult   Review of Systems  Constitutional: Positive for fever. Negative for chills.  HENT: Negative for hearing loss and tinnitus.   Eyes: Negative for blurred vision and double vision.  Respiratory: Positive for cough and wheezing.   Cardiovascular: Positive for leg swelling. Negative for chest pain and palpitations.  Gastrointestinal: Positive for constipation and heartburn. Negative for vomiting.       GERD  Genitourinary: Negative for dysuria, flank pain and  hematuria.  Musculoskeletal: Positive for joint pain and myalgias.  Skin: Negative for rash.  Neurological: Positive for weakness. Negative for seizures and loss of consciousness.  Psychiatric/Behavioral:       Anxiety, bipolar disorder  All other systems reviewed and are negative.      Past Medical History:  Diagnosis Date  . Anxiety   . Bipolar disorder (HCC)   . COPD (chronic obstructive pulmonary disease) (HCC)   . GERD (gastroesophageal reflux disease)   . Hypertension    No past surgical history on file. No family history on file. Social History:  has no tobacco, alcohol, and drug history on file. Allergies:       Allergies  Allergen Reactions  . Fentanyl Shortness Of Breath  . Meperidine Other (See Comments)    chest pain-"feels like squeezing heart"  . Azithromycin Other (See Comments)    VRE  . Codeine Nausea Only  . Levofloxacin Other (See Comments)    Unknown  . Other Other (See Comments)    BLACK STICHES=BODY REJECTS THEM  . Penicillins Rash  . Sulfa Antibiotics Rash    Tolerates Bactrim         Medications Prior to Admission  Medication Sig Dispense Refill  . albuterol (PROVENTIL HFA;VENTOLIN HFA) 108 (90 Base) MCG/ACT inhaler Inhale 2 puffs into the lungs every 4 (four) hours as needed for wheezing or shortness of breath.    Marland Kitchen atorvastatin (LIPITOR) 10 MG tablet Take 10 mg by mouth daily.    . budesonide (PULMICORT) 0.5 MG/2ML nebulizer solution Take 0.5 mg by nebulization 2 (two) times daily.    . budesonide-formoterol (SYMBICORT) 160-4.5 MCG/ACT inhaler Inhale 2 puffs into the lungs 2 (two) times daily.    . furosemide (LASIX) 20 MG tablet Take 20  mg by mouth daily.    . hydrOXYzine (VISTARIL) 25 MG capsule Take 25 mg by mouth every 6 (six) hours as needed for anxiety.     Marland Kitchen ipratropium (ATROVENT) 0.02 % nebulizer solution Take 0.5 mg by nebulization 3 (three) times daily.     Marland Kitchen ipratropium-albuterol (DUONEB) 0.5-2.5 (3)  MG/3ML SOLN Take 3 mLs by nebulization every 4 (four) hours as needed.     Marland Kitchen LORazepam (ATIVAN) 0.5 MG tablet Take 0.5 mg by mouth every 8 (eight) hours as needed for anxiety.    . meloxicam (MOBIC) 15 MG tablet Take 15 mg by mouth daily.    . metoprolol succinate (TOPROL-XL) 50 MG 24 hr tablet Take 50 mg by mouth daily. Take with or immediately following a meal.    . mirtazapine (REMERON) 30 MG tablet Take 45 mg by mouth at bedtime.    Marland Kitchen morphine (MSIR) 15 MG tablet Take 15 mg by mouth 2 (two) times daily.    Marland Kitchen omeprazole (PRILOSEC) 40 MG capsule Take 40 mg by mouth daily.    . risperiDONE (RISPERDAL) 2 MG tablet Take 2 mg by mouth at bedtime.     . sulfamethoxazole-trimethoprim (BACTRIM DS,SEPTRA DS) 800-160 MG tablet Take 1 tablet by mouth 2 (two) times daily.    Marland Kitchen valproic acid (DEPAKENE) 250 MG capsule Take 500 mg by mouth 2 (two) times daily.     . cefdinir (OMNICEF) 300 MG capsule Take 300 mg by mouth 2 (two) times daily.      Home: Home Living Family/patient expects to be discharged to:: Private residence Living Arrangements: Spouse/significant other Available Help at Discharge: Family, Available 24 hours/day Type of Home: House Home Access: Stairs to enter [employer] of Steps: 5 Entrance Stairs-Rails: Right Home Layout: One level Bathroom Shower/Tub: Walk-in shower Home Equipment: Environmental consultant - 2 wheels, Bedside commode, Shower seat, Wheelchair - manual  Functional History: Prior Function Level of Independence: Needs assistance Gait / Transfers Assistance Needed: Modified independent with rolling walker for household distances. Uses w/c if out Functional Status:  Mobility: Bed Mobility Overal bed mobility: Needs Assistance Bed Mobility: Supine to Sit Supine to sit: Min assist General bed mobility comments: Pt up in chair Transfers Overall transfer level: Needs assistance Equipment used: Ambulation equipment used Transfer via Lift  Equipment: Stedy Transfers: Sit to/from Stand, Pharmacologist Sit to Stand: Mod assist, +2 safety/equipment, Max assist Stand pivot transfers: Mod assist, +2 physical assistance General transfer comment: Heavy assist to bring hips up from low chair.  Used Stedy to pivot chair to bsc back to chair.  Ambulation/Gait General Gait Details: Pt too weak to amb    ADL:    Cognition: Cognition Overall Cognitive Status: Within Functional Limits for tasks assessed Orientation Level: Oriented X4 Cognition Arousal/Alertness: Awake/alert Behavior During Therapy: WFL for tasks assessed/performed Overall Cognitive Status: Within Functional Limits for tasks assessed  Blood pressure 110/63, pulse 65, temperature 97.7 F (36.5 C), temperature source Oral, resp. rate 15, height 5' (1.524 m), weight 79.7 kg (175 lb 11.3 oz), SpO2 90 %. Physical Exam  Vitals reviewed. Constitutional: She is oriented to person, place, and time.  HENT:  Head: Normocephalic.  Eyes: EOM are normal.  Neck: Normal range of motion. Neck supple. No thyromegaly present.  Cardiovascular: Normal rate and regular rhythm.  Exam reveals no friction rub.   No murmur heard. Respiratory:  Limited inspiratory effort with upper airway congestion. On oxygen via Abie. No shortness of breath with conversation  GI: Soft. Bowel  sounds are normal. She exhibits no distension. There is no tenderness.  Musculoskeletal: She exhibits edema.  Neurological: She is alert and oriented to person, place, and time.  UE: 4-deltoid, bicep, tricep and 4/5 wrist and HI. LE: 2+ to 3-HF, 3 KE and 4-/5 ADF/PF. No obvious sensory deficits. Cognitively intact  Skin: Skin is warm and dry.  Psychiatric: She has a normal mood and affect. Her behavior is normal.    Lab Results Last 24 Hours       Results for orders placed or performed during the hospital encounter of 08/26/16 (from the past 24 hour(s))  Glucose, capillary     Status: Abnormal    Collection Time: 09/06/16  4:52 PM  Result Value Ref Range   Glucose-Capillary 152 (H) 65 - 99 mg/dL  Glucose, capillary     Status: Abnormal   Collection Time: 09/06/16  8:31 PM  Result Value Ref Range   Glucose-Capillary 228 (H) 65 - 99 mg/dL   Comment 1 Notify RN   Glucose, capillary     Status: Abnormal   Collection Time: 09/06/16 10:36 PM  Result Value Ref Range   Glucose-Capillary 195 (H) 65 - 99 mg/dL   Comment 1 Notify RN   CBC     Status: Abnormal   Collection Time: 09/07/16  2:38 AM  Result Value Ref Range   WBC 9.4 4.0 - 10.5 K/uL   RBC 4.14 3.87 - 5.11 MIL/uL   Hemoglobin 10.8 (L) 12.0 - 15.0 g/dL   HCT 16.135.1 (L) 09.636.0 - 04.546.0 %   MCV 84.8 78.0 - 100.0 fL   MCH 26.1 26.0 - 34.0 pg   MCHC 30.8 30.0 - 36.0 g/dL   RDW 40.917.3 (H) 81.111.5 - 91.415.5 %   Platelets 210 150 - 400 K/uL  Basic metabolic panel     Status: Abnormal   Collection Time: 09/07/16  2:38 AM  Result Value Ref Range   Sodium 136 135 - 145 mmol/L   Potassium 4.3 3.5 - 5.1 mmol/L   Chloride 92 (L) 101 - 111 mmol/L   CO2 35 (H) 22 - 32 mmol/L   Glucose, Bld 152 (H) 65 - 99 mg/dL   BUN 15 6 - 20 mg/dL   Creatinine, Ser 7.820.39 (L) 0.44 - 1.00 mg/dL   Calcium 9.1 8.9 - 95.610.3 mg/dL   GFR calc non Af Amer >60 >60 mL/min   GFR calc Af Amer >60 >60 mL/min   Anion gap 9 5 - 15  Glucose, capillary     Status: Abnormal   Collection Time: 09/07/16  8:15 AM  Result Value Ref Range   Glucose-Capillary 107 (H) 65 - 99 mg/dL     Imaging Results (Last 48 hours)  No results found.    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 Does the need for close, 24 hr/day medical supervision in concert with the patient's rehab needs make it unreasonable for this patient to be served in a less intensive setting? Yes 2. Co-Morbidities requiring supervision/potential complications: COPD, chronic back pain, HTN 3. Due to bladder management, bowel  management, safety, skin/wound care, disease management, medication administration, pain management and patient education, does the patient require 24 hr/day rehab nursing? Yes 4. Does the patient require coordinated care of a physician, rehab nurse, PT (1-2 hrs/day, 5 days/week) and OT (1-2 hrs/day, 5 days/week) to address physical and functional deficits in the context of the above medical diagnosis(es)? Yes Addressing deficits in the following areas:  balance, endurance, locomotion, strength, transferring, bowel/bladder control, bathing, dressing, feeding, grooming, toileting and psychosocial support 5. Can the patient actively participate in an intensive therapy program of at least 3 hrs of therapy per day at least 5 days per week? Yes 6. The potential for patient to make measurable gains while on inpatient rehab is excellent 7. Anticipated functional outcomes upon discharge from inpatient rehab are modified independent  with PT, modified independent and supervision with OT, n/a with SLP. 8. Estimated rehab length of stay to reach the above functional goals is: 13-16 days 9. Does the patient have adequate social supports and living environment to accommodate these discharge functional goals? Yes 10. Anticipated D/C setting: Home 11. Anticipated post D/C treatments: HH therapy 12. Overall Rehab/Functional Prognosis: excellent  RECOMMENDATIONS: This patient's condition is appropriate for continued rehabilitative care in the following setting: CIR Patient has agreed to participate in recommended program. Yes Note that insurance prior authorization may be required for reimbursement for recommended care.  Comment: Cheryl Wheeler was independent within her home prior to arrival. She used a wheelchair only for longer distances in the community. Is very motivated and has a supportive husband. Rehab Admissions Coordinator to follow up.  Thanks,  Ranelle OysterZachary T. Swartz, MD, [facility] PartnershipFAAPMR     09/07/2016      Revision History                        Routing History

## 2016-09-28 NOTE — Progress Notes (Signed)
Physical Therapy Treatment Patient Details Name: Cheryl Wheeler MRN: [id number] DOB: [birth year] Today's Date: 09/28/2016    History of Present Illness Pateint is a 50 year old female admitted for acute respiratory failure with hypoxemia on 08/18/16 from CIR. Patient intubated 08/27/16, extubated 09/03/16. CIR 09/09/16-09/17/16. PMH includes advanced COPD with 3LO2 nasal cannula use at home, chronic granulomatous disease, HTN, bipolar disorder, and anxiety.    PT Comments    Patient not tolerating mobility this session with hypoxia on 3L O2 with ambulation down to 78%; was able to recover once titrated up to 4L O2 and with cues for breathing.  Patient stating will not go to SNF, but will go to CIR.  Hopeful to improve activity tolerance and safety as pt at high risk for falls.   Follow Up Recommendations  CIR     Equipment Recommendations  None recommended by PT    Recommendations for Other Services       Precautions / Restrictions Precautions Precautions: Fall Precaution Comments: watch sats    Mobility  Bed Mobility Overal bed mobility: Needs Assistance Bed Mobility: Supine to Sit     Supine to sit: Min guard     General bed mobility comments: cues for technique  Transfers Overall transfer level: Needs assistance Equipment used: Rolling walker (2 wheeled) Transfers: Sit to/from Stand Sit to Stand: Min assist         General transfer comment: assist for balance up from EOB, then from chair with cues for hand placement  Ambulation/Gait Ambulation/Gait assistance: Min assist;+2 safety/equipment Ambulation Distance (Feet): 15 Feet Assistive device: Rolling walker (2 wheeled) Gait Pattern/deviations: Step-to pattern;Trunk flexed;Shuffle;Decreased stride length     General Gait Details: poor tolerance to activity on 3L O2 today with SpO2 down to 78%; raised to 4L O2 for consistent improvement to 90%; needed encouragement for walking with pt request to just walk in  the room.    Stairs            Wheelchair Mobility    Modified Rankin (Stroke Patients Only)       Balance Overall balance assessment: Needs assistance Sitting-balance support: Feet supported Sitting balance-Leahy Scale: Good     Standing balance support: Bilateral upper extremity supported Standing balance-Leahy Scale: Poor Standing balance comment: needs UE support for balance                    Cognition Arousal/Alertness: Awake/alert Behavior During Therapy: Anxious Overall Cognitive Status: Within Functional Limits for tasks assessed                      Exercises      General Comments        Pertinent Vitals/Pain Faces Pain Scale: Hurts little more Pain Location: generalized Pain Descriptors / Indicators: Aching Pain Intervention(s): Monitored during session;Repositioned    Home Living                      Prior Function            PT Goals (current goals can now be found in the care plan section) Progress towards PT goals: Progressing toward goals    Frequency    Min 3X/week      PT Plan Current plan remains appropriate    Co-evaluation             End of Session Equipment Utilized During Treatment: Gait belt;Oxygen Activity Tolerance: Patient limited by fatigue;Treatment limited secondary to  medical complications (Comment) (hypoxia) Patient left: in chair;with call bell/phone within reach;with family/visitor present     Time: [id number] PT Time Calculation (min) (ACUTE ONLY): 17 min  Charges:  $Gait Training: 8-22 mins                    G Codes:      Elray McgregorCynthia Wynn 09/28/2016, 11:49 AM  Sheran Lawlessyndi Wynn, PT [phone] 09/28/2016

## 2016-09-28 NOTE — Progress Notes (Addendum)
Pt' RN Laury DeepShamille has spoken with ID resident.  Pt. has now been switched to PO antifungal and will not be needing PICC line at this time.  I have placed call to Dr. Ronalee BeltsBhandari for medical clearance to admit pt. to CIR today.  Will update the team when I hear back from Dr. Ronalee BeltsBhandari.  Please call if questions.  1057 ADDENDUM:  I have spoken with Dr. Ronalee BeltsBhandari and he gives medical clearance for pt. to admit to CIR today. Pt's RN and Verdis PrimeHenrietta Mayo, [facility] CampusRNCM are aware of the plan.  Pt. And husband are pleased and agreeable.     Weldon PickingSusan Tonisha Silvey PT Inpatient Rehab Admissions Coordinator Cell [phone] Office [phone]

## 2016-09-28 NOTE — Progress Notes (Signed)
Subjective: No new complaints. Going to CIR.   Antibiotics:  Anti-infectives    Start     Dose/Rate Route Frequency Ordered Stop   09/28/16 1000  voriconazole (VFEND) tablet 250 mg     250 mg Oral Every 12 hours 09/28/16 0841     09/28/16 0000  voriconazole (VFEND) 50 MG tablet     250 mg Oral Every 12 hours 09/28/16 1114     09/27/16 1615  voriconazole (VFEND) tablet 200 mg  Status:  Discontinued     200 mg Oral Every 12 hours 09/27/16 1514 09/28/16 0841   09/27/16 1615  voriconazole (VFEND) tablet 50 mg  Status:  Discontinued     50 mg Oral Every 12 hours 09/27/16 1514 09/28/16 0841   09/24/16 1030  sulfamethoxazole-trimethoprim (BACTRIM DS,SEPTRA DS) 800-160 MG per tablet 1 tablet     1 tablet Oral Every 12 hours 09/24/16 1025     09/20/16 1800  voriconazole (VFEND) 250 mg in sodium chloride 0.9 % 100 mL IVPB  Status:  Discontinued     250 mg 62.5 mL/hr over 120 Minutes Intravenous Every 12 hours 09/20/16 1401 09/27/16 1514   09/18/16 1800  voriconazole (VFEND) 310 mg in sodium chloride 0.9 % 100 mL IVPB  Status:  Discontinued     4 mg/kg  78.5 kg 65.5 mL/hr over 120 Minutes Intravenous Every 12 hours 09/17/16 1709 09/20/16 1401   09/17/16 2200  sulfamethoxazole-trimethoprim (BACTRIM DS,SEPTRA DS) 800-160 MG per tablet 1 tablet  Status:  Discontinued     1 tablet Oral 2 times daily 09/17/16 1938 09/20/16 0959   09/17/16 2000  vancomycin (VANCOCIN) IVPB 1000 mg/200 mL premix  Status:  Discontinued     1,000 mg 200 mL/hr over 60 Minutes Intravenous Every 8 hours 09/17/16 1703 09/20/16 0959   09/17/16 1800  ceFEPIme (MAXIPIME) 2 g in dextrose 5 % 50 mL IVPB  Status:  Discontinued     2 g 100 mL/hr over 30 Minutes Intravenous Every 12 hours 09/17/16 1703 09/20/16 0959   09/17/16 1800  voriconazole (VFEND) 470 mg in sodium chloride 0.9 % 100 mL IVPB     6 mg/kg  78.5 kg 73.5 mL/hr over 120 Minutes Intravenous Every 12 hours 09/17/16 1709 09/18/16 0808       Medications: Scheduled Meds: . amLODipine  10 mg Oral Daily  . atorvastatin  10 mg Oral Daily  . chlorhexidine  15 mL Mouth Rinse BID  . enoxaparin (LOVENOX) injection  40 mg Subcutaneous Q24H  . feeding supplement  1 Container Oral TID BM  . furosemide  40 mg Oral BID  . Gerhardt's butt cream   Topical TID  . insulin aspart  0-20 Units Subcutaneous TID WC  . insulin aspart  0-5 Units Subcutaneous QHS  . insulin glargine  16 Units Subcutaneous BID  . ipratropium-albuterol  3 mL Nebulization TID  . labetalol  300 mg Oral TID  . mouth rinse  15 mL Mouth Rinse q12n4p  . mirtazapine  45 mg Oral QHS  . pantoprazole  40 mg Oral Daily  . predniSONE  20 mg Oral BID WC  . sulfamethoxazole-trimethoprim  1 tablet Oral Q12H  . valproic acid  500 mg Oral BID  . voriconazole  250 mg Oral Q12H   Continuous Infusions: PRN Meds:.acetaminophen, albuterol, guaiFENesin-dextromethorphan, hydrALAZINE, hydrOXYzine, LORazepam, morphine, ondansetron (ZOFRAN) IV    Objective: Weight change: 3 lb 1.4 oz (1.4 kg)  Intake/Output Summary (Last 24 hours)  at 09/28/16 1255 Last data filed at 09/28/16 1159  Gross per 24 hour  Intake              720 ml  Output             6650 ml  Net            -5930 ml   Blood pressure (!) 119/59, pulse 70, temperature 98.6 F (37 C), temperature source Oral, resp. rate 19, height 4' 11.5" (1.511 m), weight 182 lb 5.1 oz (82.7 kg), SpO2 91 %. Temp:  [96.9 F (36.1 C)-98.9 F (37.2 C)] 98.6 F (37 C) (12/19 1158) Pulse Rate:  [63-84] 70 (12/19 1200) Resp:  [11-26] 19 (12/19 1200) BP: (119-152)/(59-74) 119/59 (12/19 1200) SpO2:  [89 %-98 %] 91 % (12/19 1200) Weight:  [182 lb 5.1 oz (82.7 kg)] 182 lb 5.1 oz (82.7 kg) (12/19 0500)  Physical Exam: General: Alert and awake, oriented x3, not in any acute distress. HEENT: anicteric sclera,  EOMI, oropharynx clear and without exudate Cardiovascular: regular rate, normal rhthm,  no murmur Pulmonary: clear to  auscultation bilaterally, no wheezing, diminished at bases, wearing Ages supplemental oxygen Gastrointestinal: soft, nontender, nondistended GU: Foley in place Neuro: nonfocal  CBC: @LABBLAST3 (wbc3,Hgb:3,Hct:3,Plt:3,INR:3APTT:3)@   BMET No results for input(s): NA, K, CL, CO2, GLUCOSE, BUN, CREATININE, CALCIUM in the last 72 hours.   Liver Panel  No results for input(s): PROT, ALBUMIN, AST, ALT, ALKPHOS, BILITOT, BILIDIR, IBILI in the last 72 hours.     Sedimentation Rate No results for input(s): ESRSEDRATE in the last 72 hours. C-Reactive Protein No results for input(s): CRP in the last 72 hours.  Micro Results: Recent Results (from the past 720 hour(s))  Culture, Urine     Status: Abnormal   Collection Time: 09/01/16  8:51 AM  Result Value Ref Range Status   Specimen Description URINE, CATHETERIZED  Final   Special Requests NONE  Final   Culture 10,000 COLONIES/mL YEAST (A)  Final   Report Status 09/02/2016 FINAL  Final  Culture, respiratory (NON-Expectorated)     Status: None (Preliminary result)   Collection Time: 09/14/16  2:20 PM  Result Value Ref Range Status   Specimen Description TRACHEAL ASPIRATE  Final   Special Requests REC'D TASP  Final   Gram Stain   Final    FEW WBC PRESENT, PREDOMINANTLY PMN RARE GRAM POSITIVE RODS RARE GRAM POSITIVE COCCI    Culture   Final    MODERATE FUNGUS ISOLATED SENT TO STATE FOR IDENTIFICATION    Report Status PENDING  Incomplete  Culture, Urine     Status: None   Collection Time: 09/14/16  5:38 PM  Result Value Ref Range Status   Specimen Description URINE, RANDOM  Final   Special Requests NONE  Final   Culture NO GROWTH  Final   Report Status 09/15/2016 FINAL  Final    Studies/Results: No results found.    Assessment/Plan:  INTERVAL HISTORY: being transferred to CIR, tranisitioned to PO voriconazole.   Active Problems:   Obesity hypoventilation syndrome (HCC)   Essential hypertension   Hypoalbuminemia due  to protein-calorie malnutrition (HCC)   Benign essential HTN   Type 2 diabetes mellitus with peripheral neuropathy (HCC)   Chronic granulomatous disease (HCC)   Acute respiratory failure with hypoxemia (HCC)   Fungal infection   Invasive aspergillosis (HCC)   Difficulty in walking, not elsewhere classified   Chronic pain syndrome   Gastroesophageal reflux disease without esophagitis   Hyperlipidemia  Cheryl Wheeler is a 50 y.o. female with  oxygen dependent COPD, AR chronic granulomatous disease, bipolar disorder, HTN, hx Aspergillus PNA.  Here with acute hypoxic respiratory failure, bilateral pulmonary infiltrates, RSV, and recurrent fungal infection.  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   - organism ID pending.   - Has been on Voriconazole since 12/8. Transitioned to PO voriconazole 12/18 - Continue Voriconazole 250mg  BID - F/u final fungal cultures to ensure aspergillus vs another fungus - Will need treatment for at least +/- a year.  Plus clinical and radiographic follow up then back to PPX dosing - Monitoring of serum trough concentrations is recommended in invasive aspergillosis treatment (and prolonged prophylaxis) - Hepatic, renal function, and electrolytes (particularly calcium, magnesium and potassium) should be followed during therapy  AR Chronic Granulomatous Disease - Continue Bactrim PPX - [facility] and will need financial assistance with medications.   LOS: 11 days   Cheryl Wheeler 09/28/2016, 12:55 PM

## 2016-09-29 ENCOUNTER — Inpatient Hospital Stay (HOSPITAL_COMMUNITY): Payer: Medicare HMO | Admitting: Occupational Therapy

## 2016-09-29 ENCOUNTER — Inpatient Hospital Stay (HOSPITAL_COMMUNITY): Payer: Medicare HMO | Admitting: Physical Therapy

## 2016-09-29 DIAGNOSIS — E1142 Type 2 diabetes mellitus with diabetic polyneuropathy: Secondary | ICD-10-CM

## 2016-09-29 DIAGNOSIS — E46 Unspecified protein-calorie malnutrition: Secondary | ICD-10-CM

## 2016-09-29 DIAGNOSIS — I10 Essential (primary) hypertension: Secondary | ICD-10-CM

## 2016-09-29 DIAGNOSIS — I5033 Acute on chronic diastolic (congestive) heart failure: Secondary | ICD-10-CM

## 2016-09-29 DIAGNOSIS — R5381 Other malaise: Principal | ICD-10-CM

## 2016-09-29 DIAGNOSIS — D62 Acute posthemorrhagic anemia: Secondary | ICD-10-CM

## 2016-09-29 LAB — CBC WITH DIFFERENTIAL/PLATELET
Basophils Absolute: 0 10*3/uL (ref 0.0–0.1)
Basophils Relative: 0 %
EOS PCT: 0 %
Eosinophils Absolute: 0 10*3/uL (ref 0.0–0.7)
HEMATOCRIT: 37.2 % (ref 36.0–46.0)
Hemoglobin: 10.9 g/dL — ABNORMAL LOW (ref 12.0–15.0)
LYMPHS ABS: 1.2 10*3/uL (ref 0.7–4.0)
LYMPHS PCT: 16 %
MCH: 25.8 pg — AB (ref 26.0–34.0)
MCHC: 29.3 g/dL — ABNORMAL LOW (ref 30.0–36.0)
MCV: 87.9 fL (ref 78.0–100.0)
MONO ABS: 0.8 10*3/uL (ref 0.1–1.0)
MONOS PCT: 10 %
NEUTROS ABS: 5.7 10*3/uL (ref 1.7–7.7)
Neutrophils Relative %: 74 %
PLATELETS: 310 10*3/uL (ref 150–400)
RBC: 4.23 MIL/uL (ref 3.87–5.11)
RDW: 19.2 % — AB (ref 11.5–15.5)
WBC: 7.3 10*3/uL (ref 4.0–10.5)

## 2016-09-29 LAB — COMPREHENSIVE METABOLIC PANEL
ALT: 28 U/L (ref 14–54)
AST: 20 U/L (ref 15–41)
Albumin: 3.3 g/dL — ABNORMAL LOW (ref 3.5–5.0)
Alkaline Phosphatase: 34 U/L — ABNORMAL LOW (ref 38–126)
Anion gap: 11 (ref 5–15)
BILIRUBIN TOTAL: 0.5 mg/dL (ref 0.3–1.2)
BUN: 9 mg/dL (ref 6–20)
CHLORIDE: 90 mmol/L — AB (ref 101–111)
CO2: 34 mmol/L — ABNORMAL HIGH (ref 22–32)
Calcium: 9.1 mg/dL (ref 8.9–10.3)
Glucose, Bld: 107 mg/dL — ABNORMAL HIGH (ref 65–99)
POTASSIUM: 4 mmol/L (ref 3.5–5.1)
Sodium: 135 mmol/L (ref 135–145)
TOTAL PROTEIN: 5.9 g/dL — AB (ref 6.5–8.1)

## 2016-09-29 LAB — MISC LABCORP TEST (SEND OUT): LABCORP TEST CODE: 284526

## 2016-09-29 LAB — GLUCOSE, CAPILLARY
GLUCOSE-CAPILLARY: 116 mg/dL — AB (ref 65–99)
GLUCOSE-CAPILLARY: 138 mg/dL — AB (ref 65–99)
Glucose-Capillary: 117 mg/dL — ABNORMAL HIGH (ref 65–99)
Glucose-Capillary: 180 mg/dL — ABNORMAL HIGH (ref 65–99)

## 2016-09-29 MED ORDER — PRO-STAT SUGAR FREE PO LIQD
30.0000 mL | Freq: Two times a day (BID) | ORAL | Status: DC
  Administered 2016-09-29 – 2016-10-16 (×35): 30 mL via ORAL
  Filled 2016-09-29 (×35): qty 30

## 2016-09-29 NOTE — Progress Notes (Signed)
Patient information reviewed and entered into eRehab system by Tora DuckMarie Kenzey Birkland, RN, CRRN, PPS Coordinator.  Information including medical coding and functional independence measure will be reviewed and updated through discharge.     Per nursing patient was given "Data Collection Information Summary for Patients in Inpatient Rehabilitation Facilities with attached "Privacy Act Statement-Health Care Records" upon admission.  Patient is also a readmission with previous IRF stay 09/09/16 through 09/17/16, ready to resume therapies.

## 2016-09-29 NOTE — IPOC Note (Signed)
Overall Plan of Care [facility](IPOC) Patient Details Name: Cheryl Wheeler MRN: [id number] DOB: [birth year]  Admitting Diagnosis: resp failure  debility   Hospital Problems: Active Problems:   Debilitated     Functional Problem List: Nursing Bladder, Bowel, Edema, Endurance, Medication Management, Pain, Perception, Safety, Skin Integrity  PT Balance, Endurance, Motor, Pain, Sensory  OT Balance, Endurance, Safety, Motor  SLP    TR         Basic ADL's: OT Grooming, Bathing, Dressing, Toileting     Advanced  ADL's: OT       Transfers: PT Bed Mobility, Bed to Chair, Car, Occupational psychologisturniture  OT Toilet, Research scientist (life sciences)Tub/Shower     Locomotion: PT Ambulation, Psychologist, prison and probation servicesWheelchair Mobility, Stairs     Additional Impairments: OT None  SLP        TR      Anticipated Outcomes Item Anticipated Outcome  Self Feeding n/a  Swallowing      Basic self-care  supervision  Toileting  supervision   Bathroom Transfers supervision  Bowel/Bladder  min assist  Transfers  supervision  Locomotion  supervision  Communication     Cognition     Pain  <3 on a 0-10 pain scale  Safety/Judgment  mod assist   Therapy Plan: PT Intensity: Minimum of 1-2 x/day ,45 to 90 minutes PT Frequency: Total of 15 hours over 7 days of combined therapies PT Duration Estimated Length of Stay: 2.5-3 weeks OT Intensity: Minimum of 1-2 x/day, 45 to 90 minutes OT Frequency: Total of 15 hours over 7 days of combined therapies OT Duration/Estimated Length of Stay: 14-17 days         Team Interventions: Nursing Interventions Patient/Family Education, Bladder Management, Bowel Management, Disease Management/Prevention, Pain Management, Medication Management, Skin Care/Wound Management, Discharge Planning, Psychosocial Support  PT interventions Ambulation/gait training, Balance/vestibular training, Discharge planning, Disease management/prevention, DME/adaptive equipment instruction, Functional mobility training, Neuromuscular re-education,  Pain management, Patient/family education, Psychosocial support, Splinting/orthotics, Stair training, Therapeutic Activities, Therapeutic Exercise, UE/LE Strength taining/ROM, Wheelchair propulsion/positioning  OT Interventions Discharge planning, Self Care/advanced ADL retraining, Therapeutic Activities, UE/LE Coordination activities, Functional mobility training, Therapeutic Exercise, Psychosocial support, UE/LE Strength taining/ROM  SLP Interventions    TR Interventions    SW/CM Interventions Discharge Planning, Psychosocial Support, Patient/Family Education    Team Discharge Planning: Destination: PT-Home ,OT- Home , SLP-  Projected Follow-up: PT-Home health PT, 24 hour supervision/assistance, OT-  Home health OT, SLP-  Projected Equipment Needs: PT-None recommended by PT, OT- To be determined, SLP-  Equipment Details: PT-has RW and w/c at home-using PTA, OT-  Patient/family involved in discharge planning: PT- Patient,  OT-Patient, SLP-   MD ELOS: 14-17 days. Medical Rehab Prognosis:  Good Assessment: 50 y.o.right handed femalewith history of advanced COPD on 2-4L of oxygen as well as BiPAPat home, chronic granulomatous disease and maintained on Bactrim for prophylaxis followed by immunologist as outpatient [facility], chronic diastolic congestive heart failure, diabetes mellitus, hypertension, bipolar disorder, chronic back pain maintained on MSIR 15 mg twice a day. Modified independent with rolling walker for household distances prior to admission. Uses a wheelchair outside of the home. Present[facility] 51/09/2016 for acute on chronic respiratory failure with low-grade fever and hypoxic in the 70s and required intubation. Findings of mild hyponatremia 128. Started on Solu-Medrol and nebulizer treatments. Transfer[facility] 08/26/2016 for ongoing medical care. Chest x-ray was negative for acute abnormality. CT angiogram of the chest negative for pulmonary embolibut  concerning for pneumonia. RSV-B positive. Completed a course of  broad-spectrum antibiotics.Pulmonary care service follow-up remain intubated through 09/03/2016.Prednisone Dosepak taper as directed. Physicaland occupationaltherapy evaluationscompleted 09/07/2016 with recommendations of physical medicine rehabilitation consult.Patient was admitted for a comprehensive rehabilitation program 09/09/2016. She was able to propel her wheelchair with moderate cueing as well as ambulate very short distances due to fatigue. A CT the chest 09/15/2016 at the discretion of pulmonary services for follow-up of suspect HCAP showed consolidation in upper lobes and left lower lobe and advised to continue antibiotic therapy that she had remained on with cefepime and vancomycin 8 day course and completed. On 09/17/2016 with increasing shortness of breath oxygen saturations dipping into the 70s and maintained on Bipap. Chest x-ray showed no new abnormalities. She denied any chest pain. Pulmonary service follow-up and felt patient should be discharged to acute care services to address pulmonary issues 09/17/2016. Placed on Solu-Medrol and tapered. Latest chest x-ray 09/23/2016 showing stable bilateral lung opacities. Presently maintained on voriconazole secondary to acute respiratory failure. Tracheal aspirate growing moderate fungus. Pt with resulting functional deficits with mobility, endurance, and strength.  Will set goals for supervision with PT/OT.   See Team Conference Notes for weekly updates to the plan of care

## 2016-09-29 NOTE — Evaluation (Signed)
Occupational Therapy Assessment and Plan  Patient Details  Name: Cheryl Wheeler MRN: [id number] Date of Birth: December 07, 1965  OT Diagnosis: abnormal posture and muscle weakness (generalized) Rehab Potential: Rehab Potential (ACUTE ONLY): Good ELOS: 14-17 days   Today's Date: 09/29/2016 OT Individual Time: 4259-5638 and 1500-1601 OT Individual Time Calculation (min): 59 min and 61 min      Problem List: Patient Active Problem List   Diagnosis Date Noted  . Debilitated 09/28/2016  . Difficulty in walking, not elsewhere classified   . Chronic pain syndrome   . Gastroesophageal reflux disease without esophagitis   . Hyperlipidemia   . Fungal infection 09/27/2016  . Invasive aspergillosis ([place])   . Acute respiratory failure with hypoxemia ([place]) 09/17/2016  . Chronic granulomatous disease ([place])   . HCAP (healthcare-associated pneumonia)   . Benign essential HTN   . Type 2 diabetes mellitus with peripheral neuropathy (HCC)   . Respiratory failure ([place])   . Opacity of lung on imaging study   . Chronic obstructive pulmonary disease ([place])   . Bipolar affective disorder ([place])   . Essential hypertension   . Acute on chronic diastolic heart failure ([place])   . Urinary incontinence   . Acute blood loss anemia   . Hypoalbuminemia due to protein-calorie malnutrition ([place])   . 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 COPD exacerbation ([place]) 09/06/2016  . Granulomatous disease ([place]) 09/06/2016  . Diastolic heart failure ([place]) 09/06/2016  . DM (diabetes mellitus) ([place]) 09/06/2016  . HTN (hypertension) 09/06/2016  . Bipolar disorder ([place]) 09/06/2016  . Anxiety 09/06/2016  . Chronic back pain 09/06/2016  . OSA (obstructive sleep apnea) 09/06/2016  . Obesity hypoventilation syndrome ([place]) 09/06/2016  . Acute pulmonary edema (HCC)   . CAP (community acquired pneumonia)   . Dyspnea   . Acute respiratory failure with hypoxia ([place]) 08/26/2016    Past Medical History:  Past Medical History:   Diagnosis Date  . Anxiety   . Bipolar disorder ([place])   . COPD (chronic obstructive pulmonary disease) ([place])   . GERD (gastroesophageal reflux disease)   . Hypertension    Past Surgical History: History reviewed. No pertinent surgical history.  Assessment & Plan Clinical Impression: Patient is a 50 y.o. year old female with history of advanced COPD on 2-4L of oxygen as well as BiPAPat home, chronic granulomatous disease and maintained on Bactrim for prophylaxis followed by immunologist as outpatient at [facility], chronic diastolic congestive heart failure, diabetes mellitus, hypertension, bipolar disorder, chronic back pain maintained on MSIR 15 mg twice a day. Per chart review patient lives with spouse. One level homewith 5 steps to entry. Modified independent with rolling walker for household distances prior to admission. Uses a wheelchair outside of the home. Presented to [facility] 08/22/2016 for acute on chronic respiratory failure with low-grade fever and hypoxic in the 70s and required intubation. Findings of mild hyponatremia 128. Started on Solu-Medrol and nebulizer treatments. Transferred to [facility] 08/26/2016 for ongoing medical care. Chest x-ray was negative for acute abnormality. CT angiogram of the chest negative for pulmonary embolibut concerning for pneumonia. RSV-B positive. Completed a course of broad-spectrum antibiotics.Pulmonary care service follow-up remain intubated through 09/03/2016.Prednisone Dosepak taper as directed. Physicaland occupationaltherapy evaluationscompleted 09/07/2016 with recommendations of physical medicine rehabilitation consult.Patient was admitted for a comprehensive rehabilitation program 09/09/2016. She was able to propel her wheelchair with moderate cueing as well as ambulate very short distances due to fatigue. A recent CT the chest 09/15/2016 at the discretion of pulmonary services  for follow-up of suspect HCAP showed consolidation  in upper lobes and left lower lobe and advised to continue antibiotic therapy that she had remained on with cefepime and vancomycin times an 8 day course and completed. On 09/17/2016 with increasing shortness of breath oxygen saturations dipping into the 70s and maintained on Bipap. Chest x-ray showed no new abnormalities. She denied any chest pain. Pulmonary service follow-up and felt patient should be discharged to acute care services to address pulmonary issues 09/17/2016. Placed on Solu-Medrol and tapered. Latest chest x-ray 09/23/2016 showing stable bilateral lung opacities. Presently maintained on voriconazole secondary to acute respiratory failure. Tracheal aspirate growing moderate fungus awaiting speciation. Placed on subcutaneous Lovenox for DVT prophylaxis 09/24/2016. Patient again admitted to resume full inpatient rehabilitation therapies. Patient transferred to CIR on 09/28/2016 .    Patient currently requires max with basic self-care skills secondary to muscle weakness, decreased cardiorespiratoy endurance and decreased oxygen support and decreased sitting balance, decreased standing balance and decreased balance strategies.  Prior to hospitalization, patient could complete ADLs  with modified independent .  Patient will benefit from skilled intervention to decrease level of assist with basic self-care skills prior to discharge home with care partner.  Anticipate patient will require 24 hour supervision and follow up home health.  OT - End of Session Activity Tolerance: Decreased this session Endurance Deficit: Yes Endurance Deficit Description: very poor oxygen support/endurance OT Assessment Rehab Potential (ACUTE ONLY): Good Barriers to Discharge: Other (comment) Barriers to Discharge Comments: none known at this time OT Patient demonstrates impairments in the following area(s): Balance;Endurance;Safety;Motor OT Basic ADL's Functional Problem(s):  Grooming;Bathing;Dressing;Toileting OT Transfers Functional Problem(s): Toilet;Tub/Shower OT Additional Impairment(s): None OT Plan OT Intensity: Minimum of 1-2 x/day, 45 to 90 minutes OT Frequency: Total of 15 hours over 7 days of combined therapies OT Duration/Estimated Length of Stay: 14-17 days OT Treatment/Interventions: Discharge planning;Self Care/advanced ADL retraining;Therapeutic Activities;UE/LE Coordination activities;Functional mobility training;Therapeutic Exercise;Psychosocial support;UE/LE Strength taining/ROM OT Self Feeding Anticipated Outcome(s): n/a OT Basic Self-Care Anticipated Outcome(s): supervision OT Toileting Anticipated Outcome(s): supervision OT Bathroom Transfers Anticipated Outcome(s): supervision OT Recommendation Recommendations for Other Services: Neuropsych consult Patient destination: Home Follow Up Recommendations: Home health OT Equipment Recommended: To be determined   Skilled Therapeutic Intervention  Session 1: Upon entering the room, pt seated in wheelchair and just transitioned from PT evaluation. Pt very fatigued at this point and declines dressing but agreeable to bathing from wheelchair at sink. Pt engaged in bathing with increased time needed for rest breaks and mod A for sit <>stand. Pt performed grooming tasks from seated position with set up A. OT educating pt on OT purpose, POC, and goals with pt verbalizing agreement and understanding. Pt transferred to bed with mod A stand pivot transfer from wheelchair >bed. Pt O2 remaining between 92-94% on 3 L O2 via [place]. Call bell and all needed items within reach upon exiting the room.   Session 2: Upon entering the room, pt supine in bed with no c/o pain but agreeable to OT intervention. Supine >sit with min A to EOB. Pt performed stand pivot transfer from bed >wheelchair with mod A. OT propelled pt via wheelchair into dayroom for energy conservation. Pt standing multiple times for less than 1 minute to  hand ornaments on christmas tree. Pt also pacing ornament from seated position and reaching out of base of support with steady assistance. Pt returned to room where pt reports increased fatigue. OT assists pt back into the bed to rest. Call bell and all  needed items within reach.    OT Evaluation Precautions/Restrictions  Precautions Precautions: Fall Precaution Comments: watch sats Pain Pain Assessment Pain Assessment: No/denies pain Home Living/Prior Functioning Home Living Family/patient expects to be discharged to:: Private residence Living Arrangements: Spouse/significant other Available Help at Discharge: Family, Available 24 hours/day Type of Home: Mobile home Home Access: Stairs to enter [employer] of Steps: 5  Entrance Stairs-Rails: Left Home Layout: One level Bathroom Shower/Tub: Multimedia programmer: Programmer, systems: Yes  Lives With: Spouse IADL History Occupation: Retired 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 Prior Function Level of Independence: Independent with gait, Independent with transfers, Requires assistive device for independence, Independent with homemaking with ambulation  Able to Take Stairs?: Yes Leisure: Hobbies-yes (Comment) Vision/Perception  Vision- History Baseline Vision/History: Wears glasses Wears Glasses: Reading only Patient Visual Report: No change from baseline Vision- Assessment Vision Assessment?: No apparent visual deficits  Cognition Overall Cognitive Status: Within Functional Limits for tasks assessed Arousal/Alertness: Awake/alert Orientation Level: Person;Place;Situation Year: 2017 Month: December Day of Week: Correct Memory: Appears intact Immediate Memory Recall: Sock;Blue;Bed Memory Recall: Sock;Blue;Bed Memory Recall Sock: Without Cue Memory Recall Blue: Without Cue Memory Recall Bed: Without Cue Sensation Sensation Light Touch: Impaired Detail Light Touch Impaired  Details: Impaired LUE Additional Comments: 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" Coordination Finger Nose Finger Test: Slight overshooting with R UE Motor  Motor Motor: Abnormal postural alignment and control;Other (comment) Motor - Skilled Clinical Observations: generalized weakness  Trunk/Postural Assessment  Cervical Assessment Cervical Assessment: Exceptions to [facility] (forward head) Thoracic Assessment Thoracic Assessment: Exceptions to [facility] (kyphotic) Lumbar Assessment Lumbar Assessment:  (posterior pelvic tilt) Postural Control Postural Control: Deficits on evaluation  Balance Balance Balance Assessed: Yes Static Standing Balance Static Standing - Balance Support: Right upper extremity supported;Left upper extremity supported Static Standing - Level of Assistance: 4: Min assist Dynamic Standing Balance Dynamic Standing - Balance Support: Right upper extremity supported;Left upper extremity supported Dynamic Standing - Level of Assistance: 3: Mod assist Extremity/Trunk Assessment RUE Assessment RUE Assessment: Exceptions to [facility] (3-/5 gross strength) LUE Assessment LUE Assessment: Exceptions to [facility] (3-/5 gross strength)   See Function Navigator for Current Functional Status.   Refer to Care Plan for Long Term Goals  Recommendations for other services: Neuropsych and Therapeutic Recreation  Pet therapy   Discharge Criteria: Patient will be discharged from OT if patient refuses treatment 3 consecutive times without medical reason, if treatment goals not met, if there is a change in medical status, if patient makes no progress towards goals or if patient is discharged from hospital.  The above assessment, treatment plan, treatment alternatives and goals were discussed and mutually agreed upon: by patient  Gypsy Decant 09/29/2016, 10:15 AM

## 2016-09-29 NOTE — Evaluation (Signed)
Physical Therapy Assessment and Plan  Patient Details  Name: Cheryl Wheeler MRN: [id number] Date of Birth: [birth year]  PT Diagnosis: Abnormal posture, Difficulty walking, Impaired sensation, Low back pain and Muscle weakness Rehab Potential: Good ELOS: 2.5-3 weeks   Today's Date: 09/29/2016 PT Individual Time: 2876-8115; 60 minutes    Problem List:  Patient Active Problem List   Diagnosis Date Noted  . Debilitated 09/28/2016  . Difficulty in walking, not elsewhere classified   . Chronic pain syndrome   . Gastroesophageal reflux disease without esophagitis   . Hyperlipidemia   . Fungal infection 09/27/2016  . Invasive aspergillosis ([place])   . Acute respiratory failure with hypoxemia ([place]) 09/17/2016  . Chronic granulomatous disease ([place])   . HCAP (healthcare-associated pneumonia)   . Benign essential HTN   . Type 2 diabetes mellitus with peripheral neuropathy (HCC)   . Respiratory failure ([place])   . Opacity of lung on imaging study   . Chronic obstructive pulmonary disease ([place])   . Bipolar affective disorder ([place])   . Essential hypertension   . Acute on chronic diastolic heart failure ([place])   . Urinary incontinence   . Acute blood loss anemia   . Hypoalbuminemia due to protein-calorie malnutrition ([place])   . 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 COPD exacerbation ([place]) 09/06/2016  . Granulomatous disease ([place]) 09/06/2016  . Diastolic heart failure ([place]) 09/06/2016  . DM (diabetes mellitus) ([place]) 09/06/2016  . HTN (hypertension) 09/06/2016  . Bipolar disorder ([place]) 09/06/2016  . Anxiety 09/06/2016  . Chronic back pain 09/06/2016  . OSA (obstructive sleep apnea) 09/06/2016  . Obesity hypoventilation syndrome ([place]) 09/06/2016  . Acute pulmonary edema (HCC)   . CAP (community acquired pneumonia)   . Dyspnea   . Acute respiratory failure with hypoxia ([place]) 08/26/2016    Past Medical History:  Past Medical History:  Diagnosis Date  . Anxiety   . Bipolar disorder  ([place])   . COPD (chronic obstructive pulmonary disease) ([place])   . GERD (gastroesophageal reflux disease)   . Hypertension    Past Surgical History: History reviewed. No pertinent surgical history.  Assessment & Plan Clinical Impression: Patient is a 50 y.o.right handed femalewith history of advanced COPD on 2-4L of oxygen as well as BiPAPat home, chronic granulomatous disease and maintained on Bactrim for prophylaxis followed by immunologist as outpatient at [facility], chronic diastolic congestive heart failure, diabetes mellitus, hypertension, bipolar disorder, chronic back pain maintained on MSIR 15 mg twice a day. Per chart review patient lives with spouse. One level homewith 5 steps to entry. Modified independent with rolling walker for household distances prior to admission. Uses a wheelchair outside of the home. Presented to [facility]/22/2016 for acute on chronic respiratory failure with low-grade fever and hypoxic in the 70s and required intubation. Findings of mild hyponatremia 128. Started on Solu-Medrol and nebulizer treatments. Transferred to [facility] 08/26/2016 for ongoing medical care. Chest x-ray was negative for acute abnormality. CT angiogram of the chest negative for pulmonary embolibut concerning for pneumonia. RSV-B positive. Completed a course of broad-spectrum antibiotics.Pulmonary care service follow-up remain intubated through 09/03/2016.Prednisone Dosepak taper as directed. Physicaland occupationaltherapy evaluationscompleted 09/07/2016 with recommendations of physical medicine rehabilitation consult.Patient was admitted for a comprehensive rehabilitation program 09/09/2016. She was able to propel her wheelchair with moderate cueing as well as ambulate very short distances due to fatigue. A recent CT the chest 09/15/2016 at the discretion of pulmonary services for follow-up of suspect HCAP showed consolidation in upper lobes and  left lower lobe and advised  to continue antibiotic therapy that she had remained on with cefepime and vancomycin times an 8 day course and completed. On 09/17/2016 with increasing shortness of breath oxygen saturations dipping into the 70s and maintained on Bipap. Chest x-ray showed no new abnormalities. She denied any chest pain. Pulmonary service follow-up and felt patient should be discharged to acute care services to address pulmonary issues 09/17/2016. Placed on Solu-Medrol and tapered. Latest chest x-ray 09/23/2016 showing stable bilateral lung opacities. Presently maintained on voriconazole secondary to acute respiratory failure. Tracheal aspirate growing moderate fungus awaiting speciation. Placed on subcutaneous Lovenox for DVT prophylaxis 09/24/2016. Patient again admitted to resume full inpatient rehabilitation therapies.  Patient transferred to CIR on 09/28/2016 .   Patient currently requires max with mobility secondary to muscle weakness, decreased cardiorespiratoy endurance and decreased oxygen support and decreased sitting balance, decreased standing balance, decreased postural control and decreased balance strategies.  Prior to hospitalization, patient was modified independent  with mobility and lived with Spouse in a Mobile home home.  Home access is 5 Stairs to enter.  Patient will benefit from skilled PT intervention to maximize safe functional mobility, minimize fall risk and decrease caregiver burden for planned discharge home with 24 hour supervision.  Anticipate patient will benefit from follow up [place] at discharge.  PT - End of Session Activity Tolerance: Decreased this session Endurance Deficit: Yes Endurance Deficit Description: very poor oxygen support/endurance PT Assessment Rehab Potential (ACUTE/IP ONLY): Good Barriers to Discharge: Inaccessible home environment Barriers to Discharge Comments: 5 STE house PT Patient demonstrates impairments in the following area(s):  Balance;Endurance;Motor;Pain;Sensory PT Transfers Functional Problem(s): Bed Mobility;Bed to Chair;Car;Furniture PT Locomotion Functional Problem(s): Ambulation;Wheelchair Mobility;Stairs PT Plan PT Intensity: Minimum of 1-2 x/day ,45 to 90 minutes PT Frequency: Total of 15 hours over 7 days of combined therapies PT Duration Estimated Length of Stay: 2.5-3 weeks PT Treatment/Interventions: Ambulation/gait training;Balance/vestibular training;Discharge planning;Disease management/prevention;DME/adaptive equipment instruction;Functional mobility training;Neuromuscular re-education;Pain management;Patient/family education;Psychosocial support;Splinting/orthotics;Stair training;Therapeutic Activities;Therapeutic Exercise;UE/LE Strength taining/ROM;Wheelchair propulsion/positioning PT Transfers Anticipated Outcome(s): supervision PT Locomotion Anticipated Outcome(s): supervision PT Recommendation Recommendations for Other Services: Neuropsych consult Follow Up Recommendations: Home health PT;24 hour supervision/assistance Patient destination: Home Equipment Recommended: None recommended by PT Equipment Details: has RW and w/c at home-using PTA  Skilled Therapeutic Intervention Pt received in bed; pt now on 3L 02.  Reporting feeling better than her previous admission.  Pt performed supine > sit with HOB elevated and bed rail with min A.  Performed stand pivot from elevated bed with RW and mod A.  Pt participated in PT evaluation including assessment of LE strength, sensation, gait with RW, simulated car transfer and one step negotiation as documented below with multiple seated rest breaks due to fatigue and DOE.  At end of session pt returned to room and handed off to OT; discussed with pt having her be set at 15/7 and to avoid back to back therapies to improve activity tolerance.  Pt verbalized agreement.  Pt left with all items within reach.  PT  Evaluation Precautions/Restrictions Precautions Precautions: Fall Precaution Comments: Monitor 02 sats General Chart Reviewed: Yes Response to Previous Treatment: Patient reporting fatigue but able to participate. Family/Caregiver Present: No Vital SignsTherapy Vitals Pulse Rate: 74 Resp: 18 BP: (!) 111/55 Patient Position (if appropriate): Lying Oxygen Therapy SpO2: 96 % O2 Device: Nasal Cannula O2 Flow Rate (L/min): 2 L/min Pain Pain Assessment Pain Assessment: 0-10 Pain Score: 6  Pain Type: Chronic pain Pain Location: Back Pain Orientation: Lower  Pain Descriptors / Indicators: Aching Pain Frequency: Occasional Pain Onset: On-going Patients Stated Pain Goal: 3 Pain Intervention(s): Medication (See eMAR) Home Living/Prior Functioning Home Living Available Help at Discharge: Family;Available 24 hours/day Type of Home: Mobile home Home Access: Stairs to enter Entrance Stairs-Number of Steps: 5  Entrance Stairs-Rails: Left Home Layout: One level  Lives With: Spouse Prior Function Level of Independence: Independent with gait;Independent with transfers;Requires assistive device for independence;Independent with homemaking with ambulation  Able to Take Stairs?: Yes Sensation Sensation Light Touch: Impaired Detail Light Touch Impaired Details: Impaired RLE;Impaired LLE;Impaired LUE Stereognosis: Not tested Hot/Cold: Not tested Proprioception: Appears Intact Additional Comments: 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" Coordination Gross Motor Movements are Fluid and Coordinated: Not tested Fine Motor Movements are Fluid and Coordinated: Not tested Motor  Motor Motor: Abnormal postural alignment and control;Other (comment) Motor - Skilled Clinical Observations: generalized weakness  Mobility Bed Mobility Bed Mobility: Supine to Sit;Sit to Supine Supine to Sit: 4: Min assist Sit to Supine: 3: Mod assist Transfers Transfers: Yes Stand Pivot Transfers: 2: Max  assist Stand Pivot Transfer Details (indicate cue type and reason): Stand pivot bed > w/c and w/c <> simulated car with RW and max lifting and controlled lowering assistance.  Required multiple attempts to stand from low w/c.  Pt unable to lift and place LE into car due to fatigue and SOB.   Locomotion  Ambulation Ambulation: Yes Ambulation/Gait Assistance: 4: Min assist Ambulation Distance (Feet): 7 Feet Assistive device: Rolling walker Gait Gait Pattern: Impaired Gait Pattern: Step-to pattern;Decreased step length - right;Decreased step length - left;Decreased stride length;Decreased hip/knee flexion - right;Decreased hip/knee flexion - left;Decreased dorsiflexion - right;Decreased dorsiflexion - left;Trunk flexed;Poor foot clearance - left;Poor foot clearance - right Stairs / Additional Locomotion Stairs: Yes Stairs Assistance: 3: Mod assist;2: Max assist Stairs Assistance Details (indicate cue type and reason): Performed negotiation of one step forwards to ascend, backwards to descend with bilat UE support on rails Stair Management Technique: Two rails;Step to pattern;Backwards;Forwards Number of Stairs: 1 Height of Stairs: 3 Product manager Assistance: 1: +1 Total assist Wheelchair Parts Management: Needs assistance Distance: 150  Trunk/Postural Assessment  Cervical Assessment Cervical Assessment: Exceptions to [facility] (decreased neck ROM.  Shoulders elevated) Thoracic Assessment Thoracic Assessment: Exceptions to [facility] (kyphotic) Lumbar Assessment Lumbar Assessment:  (posterior pelvic tilt) Postural Control Postural Control: Deficits on evaluation  Balance Balance Balance Assessed: Yes Static Sitting Balance Static Sitting - Balance Support: Right upper extremity supported;Left upper extremity supported Static Sitting - Level of Assistance: 5: Stand by assistance Dynamic Sitting Balance Dynamic Sitting - Balance Support: Right upper extremity supported;Left upper  extremity supported Dynamic Sitting - Level of Assistance: 4: Min assist Static Standing Balance Static Standing - Balance Support: Right upper extremity supported;Left upper extremity supported Static Standing - Level of Assistance: 4: Min assist Dynamic Standing Balance Dynamic Standing - Balance Support: Right upper extremity supported;Left upper extremity supported Dynamic Standing - Level of Assistance: 3: Mod assist Extremity Assessment  RUE Assessment RUE Assessment: Exceptions to [facility] (3-/5 gross strength) LUE Assessment LUE Assessment: Exceptions to [facility] (3-/5 gross strength) RLE Assessment RLE Assessment: Exceptions to [facility] RLE Strength RLE Overall Strength: Deficits RLE Overall Strength Comments: 3/5 hip flexion, 4/5 knee flexion<>extension LLE Assessment LLE Assessment: Exceptions to WFL LLE Strength LLE Overall Strength: Deficits LLE Overall Strength Comments: 3/5 overall   See Function Navigator for Current Functional Status.   Refer to Care Plan for Long Term Goals  Recommendations for other services:  Neuropsych and Therapeutic Recreation  Pet therapy, Kitchen group and Stress management  Discharge Criteria: Patient will be discharged from PT if patient refuses treatment 3 consecutive times without medical reason, if treatment goals not met, if there is a change in medical status, if patient makes no progress towards goals or if patient is discharged from hospital.  The above assessment, treatment plan, treatment alternatives and goals were discussed and mutually agreed upon: by patient  [name] 09/29/2016, 9:02 PM

## 2016-09-29 NOTE — Progress Notes (Addendum)
[facility] PHYSICAL MEDICINE & REHABILITATION     PROGRESS NOTE  Subjective/Complaints:  Pt sitting up in bed this AM.  She states she slept well overnight and is ready to start therapies all over again.    ROS: Denies CP, SOB, N/V/D.  Objective: Vital Signs: Blood pressure 135/70, pulse 71, temperature 97.6 F (36.4 C), temperature source Axillary, resp. rate 17, height 4' 11.5" (1.511 m), weight 78.5 kg (173 lb 1 oz), SpO2 94 %. No results found.  Recent Labs  09/28/16 1811 09/29/16 0639  WBC 7.6 7.3  HGB 11.0* 10.9*  HCT 35.9* 37.2  PLT 318 310    Recent Labs  09/28/16 1811 09/29/16 0639  NA  --  135  K  --  4.0  CL  --  90*  GLUCOSE  --  107*  BUN  --  9  CREATININE 0.45 <0.30*  CALCIUM  --  9.1   CBG (last 3)   Recent Labs  09/28/16 1544 09/28/16 2045 09/29/16 0641  GLUCAP 255* 256* 116*    Wt Readings from Last 3 Encounters:  09/29/16 78.5 kg (173 lb 1 oz)  09/28/16 82.7 kg (182 lb 5.1 oz)  09/17/16 81.2 kg (179 lb 1.6 oz)    Physical Exam:  BP 135/70 (BP Location: Left Arm)   Pulse 71   Temp 97.6 F (36.4 C) (Axillary)   Resp 17   Ht 4' 11.5" (1.511 m)   Wt 78.5 kg (173 lb 1 oz)   SpO2 94%   BMI 34.37 kg/m  Constitutional: She appears well-developed. NAD. HENT: Normocephalic and atraumatic.  Eyes: EOM are normal. No discharge.  Cardiovascular: Normal rate, regular rhythm. No JVD. Respiratory: Decreased breath sounds at the bases with shallow respirations but clear to auscultation. +[place]  GI: Soft. Bowel sounds are normal.  Musculoskeletal: She exhibits no edema or tenderness.  Neurological: She is alert and oriented.  Follows commands Motor: 4+/5 throughout  Skin: Skin is warm and dry.  Psychiatric: She has a normal mood and affect. Her behavior is normal.   Assessment/Plan: 1. Functional deficits secondary to debility which require 3+ hours per day of interdisciplinary therapy in a comprehensive inpatient rehab setting. Physiatrist  is providing close team supervision and 24 hour management of active medical problems listed below. Physiatrist and rehab team continue to assess barriers to discharge/monitor patient progress toward functional and medical goals.  Function:  Bathing Bathing position      Bathing parts      Bathing assist        Upper Body Dressing/Undressing Upper body dressing                    Upper body assist        Lower Body Dressing/Undressing Lower body dressing                                  Lower body assist        Toileting Toileting          Toileting assist     Transfers Chair/bed transfer             Locomotion Ambulation           Wheelchair          Cognition Comprehension Comprehension assist level: Understands basic 50 - 74% of the time/ requires cueing 25 - 49% of the time  Expression Expression  assist level: Expresses basic 75 - 89% of the time/requires cueing 10 - 24% of the time. Needs helper to occlude trach/needs to repeat words. (she will repeat parts of phrases we say to her)  Social Interaction Social Interaction assist level: Interacts appropriately 50 - 74% of the time - May be physically or verbally inappropriate.  Problem Solving Problem solving assist level: Solves basic 50 - 74% of the time/requires cueing 25 - 49% of the time  Memory Memory assist level: Recognizes or recalls 50 - 74% of the time/requires cueing 25 - 49% of the time    Medical Problem List and Plan: 1.  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 Continue BiPAP and oxygen therapy.   Taper prednisone as directed with slow wean  Begin CIR 2.  DVT Prophylaxis/Anticoagulation: Subcutaneous Lovenox. Monitor platelet counts and any signs of bleeding. Recent vascular studies negative. 3. Pain Management/chronic back pain: MSIR 15 mg twice a day as needed 4.  Mood/bipolar disorder: Remeron 45 mg daily at bedtime, valproic acid 500 mg twice a day, Ativan 0.5 mg 3 times a day as needed 5. Neuropsych: This patient is capable of making decisions on her own behalf. 6. Skin/Wound Care: Routine skin checks 7. Fluids/Electrolytes/Nutrition: Routine I&Os 8. Hypertension. Labetalol 300 mg 3 times a day, Norvasc 10 mg daily.   Controlled 12/20  Monitor with increased mobility 9. Acute on chronic diastolic congestive heart failure.   Lasix 40 mg twice a day.   Monitor for any signs of fluid overload 10. Diabetes mellitus with peripheral neuropathy. Hemoglobin A1c 6.5.   Check blood sugars before meals and at bedtime.  Lantus insulin 16 units daily.   CBGs labile, will monitor with increased activity. 11. Chronic granulomatous disease. Patient on chronic Bactrim lifelong prior to admission and continued 12. Hyperlipidemia. Lipitor 13. GERD. Protonix 14. Hypoalbuminemia  Supplement started 12/20 15. ABLA  Hb 10.9 on 12/20  Cont to monitor 16. GU  D/c foley  Monitor for retention  LOS (Days) 1 A FACE TO FACE EVALUATION WAS PERFORMED  Nisreen Guise Karis Jubanil Laurina Fischl 09/29/2016 9:10 AM

## 2016-09-29 NOTE — Progress Notes (Signed)
Social Work  Social Work Assessment and Plan  Patient Details  Name: Cheryl Wheeler MRN: [id number] Date of Birth: [birth year]  Today's Date: 09/29/2016  Problem List:  Patient Active Problem List   Diagnosis Date Noted  . Debilitated 09/28/2016  . Difficulty in walking, not elsewhere classified   . Chronic pain syndrome   . Gastroesophageal reflux disease without esophagitis   . Hyperlipidemia   . Fungal infection 09/27/2016  . Invasive aspergillosis (HCC)   . Acute respiratory failure with hypoxemia (HCC) 09/17/2016  . Chronic granulomatous disease (HCC)   . HCAP (healthcare-associated pneumonia)   . Benign essential HTN   . Type 2 diabetes mellitus with peripheral neuropathy (HCC)   . Respiratory failure (HCC)   . Opacity of lung on imaging study   . Chronic obstructive pulmonary disease (HCC)   . Bipolar affective disorder (HCC)   . Essential hypertension   . Acute on chronic diastolic heart failure (HCC)   . Urinary incontinence   . Acute blood loss anemia   . Hypoalbuminemia due to protein-calorie malnutrition (HCC)   . 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 COPD exacerbation (HCC) 09/06/2016  . Granulomatous disease (HCC) 09/06/2016  . Diastolic heart failure (HCC) 09/06/2016  . DM (diabetes mellitus) (HCC) 09/06/2016  . HTN (hypertension) 09/06/2016  . Bipolar disorder (HCC) 09/06/2016  . Anxiety 09/06/2016  . Chronic back pain 09/06/2016  . OSA (obstructive sleep apnea) 09/06/2016  . Obesity hypoventilation syndrome (HCC) 09/06/2016  . Acute pulmonary edema (HCC)   . CAP (community acquired pneumonia)   . Dyspnea   . Acute respiratory failure with hypoxia (HCC) 08/26/2016   Past Medical History:  Past Medical History:  Diagnosis Date  . Anxiety   . Bipolar disorder (HCC)   . COPD (chronic obstructive pulmonary disease) (HCC)   . GERD (gastroesophageal reflux disease)   . Hypertension    Past Surgical History: History reviewed. No pertinent surgical  history. Social History:  reports that she quit smoking about 3 months ago. Her smoking use included Cigarettes. She has never used smokeless tobacco. She reports that she does not drink alcohol or use drugs.  Family / Support Systems Marital Status: Married How Long?: 6 yrs Patient Roles: Spouse, Parent Spouse/Significant Other: spouse, Christin BachRonnie Peoples @ [phone](C) [phone] Children: pt has a son but limited contact Anticipated Caregiver: Spouse  Ability/Limitations of Caregiver: None - pt reports he is also on disability, however, independent and able to assist her. Caregiver Availability: 24/7  Social History Preferred language: English Religion: [employer] Cultural Background: NA Education: HS Read: Yes Write: Yes Employment Status: Disabled Date Retired/Disabled/Unemployed: since age 50 Legal Hisotry/Current Legal Issues: None Guardian/Conservator: None - per MD, pt is capable of making decisions on her own behalf   Abuse/Neglect Physical Abuse: Denies Verbal Abuse: Denies Sexual Abuse: Denies Exploitation of patient/patient's resources: Denies Self-Neglect: Denies  Emotional Status Pt's affect, behavior adn adjustment status: Pt up in w/c and admittedly fatigued from  therapies but happy to have returned to CIR.  Notes she is more optimistic about CIR as she feels "stronger" this admit.  Denies any current emotional distress - will monitor and refer for neuropsychology as indicated. Recent Psychosocial Issues: None Pyschiatric History: Bipolar - [facility] at [facility] in [place] q 3 mos for med management.   Substance Abuse History: NA  Patient / Family Perceptions, Expectations & Goals Pt/Family understanding of illness & functional limitations: Pt and family with good understanding of her medical issues, debility and need for CIR.  Premorbid pt/family roles/activities: pt independent PTA, however, not out of the home much except for MD appointments Anticipated changes in  roles/activities/participation: little change anticipated if able to reach supervision goals. Pt/family expectations/goals: Pt has goals for supervision as this is close to baseline.  Community [employer] Community Agencies: None Premorbid Home Care/DME Agencies: Other (Comment) [facility] Resource referrals recommended: Neuropsychology  Discharge Planning Living Arrangements: Spouse/significant other Support Systems: Spouse/significant other Type of Residence: Private residence Insurance Resources: Medicare Financial Resources: SSD Financial Screen Referred: No Living Expenses: Rent Money Management: Patient Does the patient have any problems obtaining your medications?: No Home Management: mostly spouse Patient/Family Preliminary Plans: Pt to return home with spouse able to provide 24/7 assistance. Social Work Anticipated Follow Up Needs: HH/OP Expected length of stay: 13-16 days  Clinical Impression Very pleasant woman returning to CIR from acute and feels "stronger this time". She denies any concerns except with questions about meds and DME.  Husband able to provide 24/7 supervision and very supportive.  Will follow for support and d/c planning needs.  Cope Marte 09/29/2016, 10:13 AM

## 2016-09-30 ENCOUNTER — Inpatient Hospital Stay (HOSPITAL_COMMUNITY): Payer: Medicare HMO | Admitting: Physical Therapy

## 2016-09-30 ENCOUNTER — Inpatient Hospital Stay (HOSPITAL_COMMUNITY): Payer: Medicare HMO | Admitting: Occupational Therapy

## 2016-09-30 DIAGNOSIS — R7309 Other abnormal glucose: Secondary | ICD-10-CM

## 2016-09-30 DIAGNOSIS — J9621 Acute and chronic respiratory failure with hypoxia: Secondary | ICD-10-CM

## 2016-09-30 DIAGNOSIS — R32 Unspecified urinary incontinence: Secondary | ICD-10-CM

## 2016-09-30 DIAGNOSIS — D71 Functional disorders of polymorphonuclear neutrophils: Secondary | ICD-10-CM

## 2016-09-30 DIAGNOSIS — E669 Obesity, unspecified: Secondary | ICD-10-CM

## 2016-09-30 DIAGNOSIS — E1169 Type 2 diabetes mellitus with other specified complication: Secondary | ICD-10-CM

## 2016-09-30 DIAGNOSIS — E119 Type 2 diabetes mellitus without complications: Secondary | ICD-10-CM

## 2016-09-30 LAB — MISC LABCORP TEST (SEND OUT): Labcorp test code: 700353

## 2016-09-30 LAB — GLUCOSE, CAPILLARY
GLUCOSE-CAPILLARY: 118 mg/dL — AB (ref 65–99)
Glucose-Capillary: 111 mg/dL — ABNORMAL HIGH (ref 65–99)
Glucose-Capillary: 237 mg/dL — ABNORMAL HIGH (ref 65–99)
Glucose-Capillary: 224 mg/dL — ABNORMAL HIGH (ref 65–99)

## 2016-09-30 NOTE — Progress Notes (Signed)
Occupational Therapy Session Note  Patient Details  Name: Cheryl NissenJonce Ann Peeks MRN: [id number] Date of Birth: 05-06-1966  Today's Date: 09/30/2016 OT Individual Time: [id number] OT Individual Time Calculation (min): 43 min     Skilled Therapeutic Interventions/Progress Updates:    Pt completed sit to stand at the sink for several intervals with mod assist overall in order to complete toilet hygiene as well as to complete clothing management sit to stand for donning new brief.  She was able to release the RLE in standing to complete peri hygiene and to assist with donning new brief but needed mod assist overall.  Once finished rolled her down to the therapy gym for work on endurance and UE strength using the Ergonometer.  She was able to complete 3 intervals of 3 mins with resistance on level 8 and RPMs maintain at 15-17.  HR maintained at 82-85 BPM with Oxygen sats 93-94% on 3 Ls nasal cannula.  Two minute rest breaks completed after each interval and with conclusion of the final set.  Had her propel her wheelchair to the nurses station to conclude session, with therapist assisting the rest of the way to the room.  Assisted pt with stand pivot transfer to the bed with mod assist including min assist for bringing LEs into the bed when transitioning to supine.  Pt left with call button and phone in reach.  Bed alarm also in place as well.    Therapy Documentation Precautions:  Precautions Precautions: Fall Precaution Comments: Monitor 02 sats Restrictions Weight Bearing Restrictions: No  Pain: Pain Assessment Pain Assessment: No/denies pain ADL: See Function Navigator for Current Functional Status.   Therapy/Group: Individual Therapy  Baneen Wieseler OTR/L 09/30/2016, 3:56 PM

## 2016-09-30 NOTE — Care Management Note (Signed)
[facility] Individual Statement of Services  Patient Name:  Cheryl Wheeler  Date:  09/30/2016  Welcome to the [facility].  Our goal is to provide you with an individualized program based on your diagnosis and situation, designed to meet your specific needs.  With this comprehensive rehabilitation program, you will be expected to participate in at least 3 hours of rehabilitation therapies Monday-Friday, with modified therapy programming on the weekends.  Your rehabilitation program will include the following services:  Physical Therapy (PT), Occupational Therapy (OT), 24 hour per day rehabilitation nursing, Therapeutic Recreaction (TR), Neuropsychology, Case Management (Social Worker), Rehabilitation Medicine, Nutrition Services and Pharmacy Services  Weekly team conferences will be held on Wednesdays to discuss your progress.  Your Social Worker will talk with you frequently to get your input and to update you on team discussions.  Team conferences with you and your family in attendance may also be held.  Expected length of stay: 14-17 days  Overall anticipated outcome: supervision  Depending on your progress and recovery, your program may change. Your Social Worker will coordinate services and will keep you informed of any changes. Your Social Worker's name and contact numbers are listed  below.  The following services may also be recommended but are not provided by the [facility]:   Driving Evaluations  Home Health Rehabiltiation Services  Outpatient Rehabilitation Services    Arrangements will be made to provide these services after discharge if needed.  Arrangements include referral to agencies that provide these services.  Your insurance has been verified to be:  [employer] Your primary doctor is:  Dr. Jeanie Seweredding  Pertinent information will be shared with your doctor and your insurance company.  Social Worker:   [place]opertonLucy Kasch [place], [place] [phone] or ([phone]) [phone]   Information discussed with and copy given to patient by: Amada JupiterHOYLE, Keshawn Sundberg, 09/30/2016, 11:14 AM

## 2016-09-30 NOTE — Progress Notes (Signed)
Occupational Therapy Session Note  Patient Details  Name: Cheryl Wheeler MRN: [id number] Date of Birth: 1966-09-16  Today's Date: 09/30/2016 OT Individual Time: 1350-1420 OT Individual Time Calculation (min): 30 min     Short Term Goals: Week 1:  OT Short Term Goal 1 (Week 1): Pt will complete bathing with Min A and AE PRN OT Short Term Goal 2 (Week 1): Pt will complete LB dressing with Mod A  OT Short Term Goal 3 (Week 1): Pt will stand for 2 minutes while engaged in unilaterally involved task to increase independence with LB self care OT Short Term Goal 4 (Week 1): Pt will complete BSC transfer and LRAD with Min A  Skilled Therapeutic Interventions/Progress Updates:    Treatment session with focus on sit <> stand and standing tolerance.  Pt received seated in w/c with RN present providing medications.  Engaged in sit > stand with attempts to stand from w/c with use of RW.  Pt unable to come into standing with UE support on w/c or even pulling up on RW with max assist from therapist.  Repositioned to allow increased leverage with pt pushing up from sink with pt able to complete x3 with max assist.  Pt tolerated standing 30, 15, and 10 seconds before requiring return to sitting.  Max encouragement for participation and to continue to attempt sit > stand.  Therapy Documentation Precautions:  Precautions Precautions: Fall Precaution Comments: Monitor 02 sats Restrictions Weight Bearing Restrictions: No General:   Vital Signs: Therapy Vitals Temp: 98.3 F (36.8 C) Temp Source: Oral Pulse Rate: 81 Resp: 16 BP: (!) 145/63 Patient Position (if appropriate): Lying Oxygen Therapy SpO2: 95 % O2 Device: Nasal Cannula O2 Flow Rate (L/min): 3 L/min Pain: Pain Assessment Pain Assessment: No/denies pain  See Function Navigator for Current Functional Status.   Therapy/Group: Individual Therapy  Rosalio LoudHOXIE, Darden Flemister 09/30/2016, 3:58 PM

## 2016-09-30 NOTE — Progress Notes (Signed)
Occupational Therapy Session Note  Patient Details  Name: Cheryl Wheeler MRN: [id number] Date of Birth: 20-Mar-1966  Today's Date: 09/30/2016 OT Individual Time: 0930-1030 OT Individual Time Calculation (min): 60 min     Short Term Goals: Week 1:  OT Short Term Goal 1 (Week 1): Pt will complete bathing with Min A and AE PRN OT Short Term Goal 2 (Week 1): Pt will complete LB dressing with Mod A  OT Short Term Goal 3 (Week 1): Pt will stand for 2 minutes while engaged in unilaterally involved task to increase independence with LB self care OT Short Term Goal 4 (Week 1): Pt will complete BSC transfer and LRAD with Min A  Skilled Therapeutic Interventions/Progress Updates:     Upon entering the room, pt supine in bed with RN present giving medication. Pt reports feeling very down and "depressed" as she will be in the hospital during the upcoming holidays. Pt performed supine >sit with min A to EOB. Stand pivot transfer with max A for lifting and lowering. Pt performed grooming tasks at sink with set up A to obtain items. She declined bathing, dressing, and toileting this session. Pt attempting to propel wheelchair with B UE's but unable and needing total A. Pt taken to lobby to look at hospital christmas tree while pt and therapist discussed home set up and recommendations. Pt also discussing energy conservation ideas for home use and explaining daily routine she currently has when at home with husband. Pt returned to room at end of session with call bell and all needed items within reach. Pt on 3 L O2 via [place] with saturation WFL.   Therapy Documentation Precautions:  Precautions Precautions: Fall Precaution Comments: Monitor 02 sats Restrictions Weight Bearing Restrictions: No General:   Vital Signs: Therapy Vitals Temp: 98.3 F (36.8 C) Temp Source: Oral Pulse Rate: 81 Resp: 16 BP: (!) 145/63 Patient Position (if appropriate): Lying Oxygen Therapy SpO2: 95 % O2 Device: Nasal  Cannula O2 Flow Rate (L/min): 3 L/min Pain: Pain Assessment Pain Assessment: No/denies pain  See Function Navigator for Current Functional Status.   Therapy/Group: Individual Therapy  [name], Hollis Tuller P 09/30/2016, 5:08 PM

## 2016-09-30 NOTE — Progress Notes (Signed)
Physical Therapy Session Note  Patient Details  Name: Cheryl Wheeler MRN: [id number] Date of Birth: 01/09/1966  Today's Date: 09/30/2016 PT Individual Time: 1100-1200 PT Individual Time Calculation (min): 60 min    Short Term Goals:Week 1:  PT Short Term Goal 1 (Week 1): Pt will perform bed mobility on flat bed with min A PT Short Term Goal 2 (Week 1): Pt will perform sit <> stand and stand pivot with RW and min A PT Short Term Goal 3 (Week 1): Pt will perform w/c mobility x 100' with supervision for UE strengthening and endurance PT Short Term Goal 4 (Week 1): Pt will perform gait x 25' with RW and mod A PT Short Term Goal 5 (Week 1): Pt will negotiate 4 short stairs with 2 rails and mod A  Skilled Therapeutic Interventions/Progress Updates:    Patient received sitting in WC and agreeable to PT.  PT transported patient to rehab gym with total assist for energy conservation and time management.   Pt instructed patient blocked practice sit<>stand 2 x 3 with intermittent mod-max assist from PT to stand from [facility] height. Mod cues for improved UE postioning to increased success and safety with transfer. Prolonged reset break required after each bout of sit<>stand training.   Gait training instructed by PT with RW 50f x 2 with min assist from PT. Poor Endurance and fatigue limited increased distasnce. Patient noted to report SOB, but Spo2 WNL after each bout.   Nustep x total of 8 minutes with 1 prolonged rest break RPE at [employer] 14/20. Patient noted to require max assist [facility] and min assist to stand [facility]   Sit<>stand stand in room with mod assist with use of RW. Standing tolerance to replace brief after incontinent bladder evacuaiton. PT required to perform perineal hygiene due to required BUE support while standing.   Throughout treatment, Pt assessed SpO2 during breaks; remained >96% on 3L/min supplemental O2.   [facility] and all needs met.       Therapy Documentation Precautions:  Precautions Precautions: Fall Precaution Comments: Monitor 02 sats Restrictions Weight Bearing Restrictions: No General:   Vital Signs: Oxygen Therapy SpO2: 97 % O2 Device: Nasal Cannula O2 Flow Rate (L/min): 3 L/min Pain:   0/10   See Function Navigator for Current Functional Status.   Therapy/Group: Individual Therapy  ALorie Phenix12/21/2017, 11:50 AM

## 2016-09-30 NOTE — Progress Notes (Signed)
[place] PHYSICAL MEDICINE & REHABILITATION     PROGRESS NOTE  Subjective/Complaints:  Pt sitting up in bed this AM.  She slept well overnight.  She request her diet be changed to only a Carb modified diet.    ROS: Denies CP, SOB, N/V/D.  Objective: Vital Signs: Blood pressure 138/67, pulse 64, temperature 98.4 F (36.9 C), temperature source Axillary, resp. rate 17, height 4' 11.5" (1.511 m), weight 77.1 kg (169 lb 15.6 oz), SpO2 97 %. No results found.  Recent Labs  09/28/16 1811 09/29/16 0639  WBC 7.6 7.3  HGB 11.0* 10.9*  HCT 35.9* 37.2  PLT 318 310    Recent Labs  09/28/16 1811 09/29/16 0639  NA  --  135  K  --  4.0  CL  --  90*  GLUCOSE  --  107*  BUN  --  9  CREATININE 0.45 <0.30*  CALCIUM  --  9.1   CBG (last 3)   Recent Labs  09/29/16 2042 09/30/16 0634 09/30/16 1227  GLUCAP 180* 111* 118*    Wt Readings from Last 3 Encounters:  09/30/16 77.1 kg (169 lb 15.6 oz)  09/28/16 82.7 kg (182 lb 5.1 oz)  09/17/16 81.2 kg (179 lb 1.6 oz)    Physical Exam:  BP 138/67 (BP Location: Left Arm)   Pulse 64   Temp 98.4 F (36.9 C) (Axillary)   Resp 17   Ht 4' 11.5" (1.511 m)   Wt 77.1 kg (169 lb 15.6 oz)   SpO2 97%   BMI 33.76 kg/m  Constitutional: She appears well-developed. NAD. HENT: Normocephalic and atraumatic.  Eyes: EOM are normal. No discharge.  Cardiovascular: RRR. No JVD. Respiratory: Unlabored. Clear. +[place]  GI: Soft. Bowel sounds are normal.  Musculoskeletal: She exhibits no edema or tenderness.  Neurological: She is alert and oriented.  Follows commands Motor: 4+/5 throughout (unchanged) Skin: Skin is warm and dry.  Psychiatric: She has a normal mood and affect. Her behavior is normal.   Assessment/Plan: 1. Functional deficits secondary to debility which require 3+ hours per day of interdisciplinary therapy in a comprehensive inpatient rehab setting. Physiatrist is providing close team supervision and 24 hour management of active  medical problems listed below. Physiatrist and rehab team continue to assess barriers to discharge/monitor patient progress toward functional and medical goals.  Function:  Bathing Bathing position   Position: Wheelchair/chair at sink  Bathing parts Body parts bathed by patient: Right arm, Left arm, Chest, Abdomen, Right upper leg, Left upper leg Body parts bathed by helper: Right lower leg, Left lower leg, Back  Bathing assist Assist Level: Touching or steadying assistance(Pt > 75%)      Upper Body Dressing/Undressing Upper body dressing   What is the patient wearing?: Hospital gown                Upper body assist Assist Level: Set up, Supervision or verbal cues   Set up : To obtain clothing/put away  Lower Body Dressing/Undressing Lower body dressing   What is the patient wearing?: Non-skid slipper socks           Non-skid slipper socks- Performed by helper: Don/doff right sock, Don/doff left sock                  Lower body assist        Toileting Toileting Toileting activity did not occur: No continent bowel/bladder event        Toileting assist     Transfers  Chair/bed transfer   Chair/bed transfer method: Stand pivot Chair/bed transfer assist level: Maximal assist (Pt 25 - 49%/lift and lower) Chair/bed transfer assistive device: Patent attorneyWalker     Locomotion Ambulation     Max distance: 7 Assist level: Touching or steadying assistance (Pt > 75%)   Wheelchair   Type: Manual Max wheelchair distance: 150 Assist Level: Total assistance (Pt < 25%)  Cognition Comprehension Comprehension assist level: Understands basic 75 - 89% of the time/ requires cueing 10 - 24% of the time  Expression Expression assist level: Expresses basic 75 - 89% of the time/requires cueing 10 - 24% of the time. Needs helper to occlude trach/needs to repeat words.  Social Interaction Social Interaction assist level: Interacts appropriately 50 - 74% of the time - May be  physically or verbally inappropriate.  Problem Solving Problem solving assist level: Solves basic 75 - 89% of the time/requires cueing 10 - 24% of the time  Memory Memory assist level: Recognizes or recalls 50 - 74% of the time/requires cueing 25 - 49% of the time    Medical Problem List and Plan: 1.  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 Continue BiPAP and oxygen therapy.   Taper prednisone as directed with slow wean  Cont CIR 2. DVT Prophylaxis/Anticoagulation: Subcutaneous Lovenox. Monitor platelet counts and any signs of bleeding. Recent vascular studies negative. 3. Pain Management/chronic back pain: MSIR 15 mg twice a day as needed 4. Mood/bipolar disorder: Remeron 45 mg daily at bedtime, valproic acid 500 mg twice a day, Ativan 0.5 mg 3 times a day as needed 5. Neuropsych: This patient is capable of making decisions on her own behalf. 6. Skin/Wound Care: Routine skin checks 7. Fluids/Electrolytes/Nutrition: Routine I&Os 8. Hypertension. Labetalol 300 mg 3 times a day, Norvasc 10 mg daily.   Controlled 12/21  Monitor with increased mobility 9. Acute on chronic diastolic congestive heart failure.   Lasix 40 mg twice a day.   Monitor for any signs of fluid overload 10. Diabetes mellitus with peripheral neuropathy. Hemoglobin A1c 6.5.   Check blood sugars before meals and at bedtime.  Lantus insulin 16 units daily.   CBGs slightly labile, but overall controlled  Will monitor with increased activity. 11. Chronic granulomatous disease. Patient on chronic Bactrim lifelong prior to admission and continued 12. Hyperlipidemia. Lipitor 13. GERD. Protonix 14. Hypoalbuminemia  Supplement started 12/20 15. ABLA  Hb 10.9 on 12/20  Cont to monitor 16. Urinary Incontinence  D/ced foley 12/20  PVRs ordered  Monitor for retention 17. Obesity  Body mass index is 33.76 kg/m.  Diet and  exercise education  Will cont to encourage weight loss to increase endurance and promote overall health 18. HCAP with acute respiratory failure  Monitor Voriconazole levels, send out pending  LOS (Days) 2 A FACE TO FACE EVALUATION WAS PERFORMED  Quina Wilbourne Karis Jubanil Mohd. Derflinger 09/30/2016 1:05 PM

## 2016-10-01 ENCOUNTER — Inpatient Hospital Stay (HOSPITAL_COMMUNITY): Payer: Medicare HMO | Admitting: Occupational Therapy

## 2016-10-01 ENCOUNTER — Inpatient Hospital Stay (HOSPITAL_COMMUNITY): Payer: Medicare HMO | Admitting: Physical Therapy

## 2016-10-01 DIAGNOSIS — J189 Pneumonia, unspecified organism: Secondary | ICD-10-CM

## 2016-10-01 LAB — GLUCOSE, CAPILLARY
GLUCOSE-CAPILLARY: 200 mg/dL — AB (ref 65–99)
GLUCOSE-CAPILLARY: 305 mg/dL — AB (ref 65–99)
GLUCOSE-CAPILLARY: 190 mg/dL — AB (ref 65–99)
Glucose-Capillary: 108 mg/dL — ABNORMAL HIGH (ref 65–99)

## 2016-10-01 NOTE — Progress Notes (Signed)
Coding clarification: late entry.  Pt with possible recurrent fungal infection, exact type unknown. Has history of aspergillosis. Follow up with Infectious disease. Also had RSV during admission to the hospital.

## 2016-10-01 NOTE — Progress Notes (Signed)
Occupational Therapy Session Note  Patient Details  Name: Cheryl NissenJonce Ann Luscher MRN: [id number] Date of Birth: October 02, 1966  Today's Date: 10/01/2016 OT Individual Time: 7829-56210959-1059 and 3086-57841456-1548 OT Individual Time Calculation (min): 60 min and 52 min   Short Term Goals: Week 1:  OT Short Term Goal 1 (Week 1): Pt will complete bathing with Min A and AE PRN OT Short Term Goal 2 (Week 1): Pt will complete LB dressing with Mod A  OT Short Term Goal 3 (Week 1): Pt will stand for 2 minutes while engaged in unilaterally involved task to increase independence with LB self care OT Short Term Goal 4 (Week 1): Pt will complete BSC transfer and LRAD with Min A  Skilled Therapeutic Interventions/Progress Updates:   Pt was lying in bed at time of arrival with husband Christen BameRonnie present, requesting to complete ADLs. Resting 02 on 3L 96%. Supine<sit completed with supervision. Stand pivot<w/c completed with Mod A and instruction on hand placement. 02 sats 96% after transfer. UB/LB ADLs completed w/c level at sink with overall Min A for threading left pant leg. Pt able to utilize ankle over knee strategies for applying lotion on feet and donning gripper socks. LH sponge was used for pts back. Pt instructed on PLB and energy conservation techniques throughout. She reports concerns regarding incontinence and was educated on timed toileting, which she reported did not help in past. Pt and husband reported preferring briefs with pt educated on changing brief often (whenever she recognizes incontinence episode) for skin integrity. At end of session pt was left in w/c [facility]. 02 sats stable 95-96% throughout session, and were stable at time of departure.   2nd Session 1:1 Tx (52 min) Pt was sitting up in w/c at time of arrival, reported significant fatigue from previous therapy session. Attempted to complete shower transfer in bathroom with simulated home setup with pt unable to achieve full stand with max A and RW. Pt  reported that her legs felt very weak and unsteady. Therefore pt participated in w/c level activities for remainder of session with focus on endurance, w/c mobility, and UB strengthening. Pt propelled to dayroom and completed w/c obstacle course activity involving retrieving items off of floor with use of reacher. Pt required supervision for navigating around tight spaces, changing directions, implementing w/c safety, and 02 mgt. Afterwards pt watered plants located within obstacle course, which appeared to positively affect her psychosocial wellbeing. Pt reported liking plants and was able to name the different greenery in the dayroom. Afterwards pt was taken back to room due to fatigue/time. Pt was transferred back to bed with Mod A and RW. Pt was left in care of nursing at time of departure for brief change. 02 sats throughout tx 95-96% on 3L.   Therapy Documentation Precautions:  Precautions Precautions: Fall Precaution Comments: Monitor 02 sats Restrictions Weight Bearing Restrictions: No General:   Vital Signs: Therapy Vitals Pulse Rate: 73 Resp: 16 Patient Position (if appropriate): Lying Oxygen Therapy SpO2: 94 % O2 Device: Nasal Cannula O2 Flow Rate (L/min): 3 L/min Pain: No c/o pain during sessions  Pain Assessment Pain Assessment: No/denies pain ADL:      See Function Navigator for Current Functional Status.   Therapy/Group: Individual Therapy  Yecheskel Kurek A Aldwin Micalizzi 10/01/2016, 12:49 PM

## 2016-10-01 NOTE — Progress Notes (Signed)
Social Work Patient ID: Cheryl NissenJonce Ann Croke, female   DOB: May 16, 1966, 50 y.o.   MRN: [id number]  Have reviewed team conference with pt and spouse.  Both aware and agreeable with targeted d/c date of 1/6 and supervision goals.  Pt and spouse asking if I can assist with referral for pt to be followed by [place] Pulmonologist after d/c. Will discuss with PA and assist if able.  Both pleased with her better endurance for CIR this time.  Will continue to follow.  Cyan Moultrie, LCSW

## 2016-10-01 NOTE — Progress Notes (Signed)
Physical Therapy Session Note  Patient Details  Name: Cheryl Wheeler MRN: [id number] Date of Birth: [birth year]  Today's Date: 10/01/2016 PT Individual Time: 1135-1205 PT Individual Time Calculation (min): 30 min    Short Term Goals: Week 1:  PT Short Term Goal 1 (Week 1): Pt will perform bed mobility on flat bed with min A PT Short Term Goal 2 (Week 1): Pt will perform sit <> stand and stand pivot with RW and min A PT Short Term Goal 3 (Week 1): Pt will perform w/c mobility x 100' with supervision for UE strengthening and endurance PT Short Term Goal 4 (Week 1): Pt will perform gait x 25' with RW and mod A PT Short Term Goal 5 (Week 1): Pt will negotiate 4 short stairs with 2 rails and mod A  Skilled Therapeutic Interventions/Progress Updates:  Pt received in w/c with husband present.  Pt expressing concerns about home entry/exit and stair negotiation.  Transported pt to gym and demonstrated and verbalized two sequences for performing stair negotiation with L rail-1. Holding rail with one hand + HHA on R or 2. Laterally with bilat UE support on one rail.  Pt attempted to perform laterally with L rail to ascend and descend but unable to perform safely on 3" step.  Performed up/down 3 steps with L rail and HHA with mod A x 2 reps forwards to ascend, backwards to descend.  Pt's stairs at home are 6" tall; will need to progress height on DST to work towards 6" steps.  Husband also considering having land lord install ramp for home entry/exit.  Discussed with pt and husband that even with ramp, would continue to perform stair negotiation training for LE strengthening.  Pt agreed.  Returned to room and pt set up in w/c with tray and all items within reach for lunch.   Therapy Documentation Precautions:  Precautions Precautions: Fall Precaution Comments: Monitor 02 sats Restrictions Weight Bearing Restrictions: No Vital Signs: Therapy Vitals Pulse Rate: 73 Resp: 16 Patient Position (if  appropriate): Lying Oxygen Therapy SpO2: 94 % O2 Device: Nasal Cannula O2 Flow Rate (L/min): 3 L/min Pain: Pain Assessment Pain Assessment: No/denies pain   See Function Navigator for Current Functional Status.   Therapy/Group: Individual Therapy  Edman CircleHall, Lavalle Skoda [facility] CenterFaucette 10/01/2016, 12:13 PM

## 2016-10-01 NOTE — Progress Notes (Signed)
Physical Therapy Note  Patient Details  Name: Cheryl Wheeler MRN: [id number] Date of Birth: 13-Feb-1966 Today's Date: 10/01/2016    Time: 1300-1355 55 minutes  1:1 no c/o pain. Session focused on increasing endurance and activity tolerance.  Stair negotiation on 4'' and 5'' steps with mod A.  Standing tolerance 3 x 60 seconds with prolonged rests in between. Seated therex for bilat LE strengthening and activity tolerance 3 x 10 LAQ, HS curl, hip abd, hip flex, ankle pumps. Pt requires frequent rests but is able to maintain spO2 >95% throughout session on 3LO2.   Juliane Guest 10/01/2016, 1:59 PM

## 2016-10-01 NOTE — Progress Notes (Signed)
PHYSICAL MEDICINE & REHABILITATION     PROGRESS NOTE  Subjective/Complaints:  Pt sitting up in bed this AM.  She states she slept better.  She is more alert and interactive this AM.  ROS: Denies CP, SOB, N/V/D.  Objective: Vital Signs: Blood pressure 137/67, pulse 73, temperature 97.8 F (36.6 C), temperature source Oral, resp. rate 16, height 4' 11.5" (1.511 m), weight 77.1 kg (169 lb 15.6 oz), SpO2 94 %. No results found.  Recent Labs  09/28/16 1811 09/29/16 0639  WBC 7.6 7.3  HGB 11.0* 10.9*  HCT 35.9* 37.2  PLT 318 310    Recent Labs  09/28/16 1811 09/29/16 0639  NA  --  135  K  --  4.0  CL  --  90*  GLUCOSE  --  107*  BUN  --  9  CREATININE 0.45 <0.30*  CALCIUM  --  9.1   CBG (last 3)   Recent Labs  09/30/16 1657 09/30/16 2050 10/01/16 0659  GLUCAP 237* 224* 108*    Wt Readings from Last 3 Encounters:  09/30/16 77.1 kg (169 lb 15.6 oz)  09/28/16 82.7 kg (182 lb 5.1 oz)  09/17/16 81.2 kg (179 lb 1.6 oz)    Physical Exam:  BP 137/67 (BP Location: Left Arm)   Pulse 73   Temp 97.8 F (36.6 C) (Oral)   Resp 16   Ht 4' 11.5" (1.511 m)   Wt 77.1 kg (169 lb 15.6 oz)   SpO2 94%   BMI 33.76 kg/m  Constitutional: She appears well-developed. NAD. HENT: Normocephalic and atraumatic.  Eyes: EOM are normal. No discharge.  Cardiovascular: RRR. No JVD. Respiratory: Unlabored. Clear. +[place]  GI: Soft. Bowel sounds are normal.  Musculoskeletal: She exhibits no edema or tenderness.  Neurological: She is alert and oriented.  Follows commands Motor: 4+/5 throughout (stable) Skin: Skin is warm and dry.  Psychiatric: She has a normal mood and affect. Her behavior is normal.   Assessment/Plan: 1. Functional deficits secondary to debility which require 3+ hours per day of interdisciplinary therapy in a comprehensive inpatient rehab setting. Physiatrist is providing close team supervision and 24 hour management of active medical problems listed  below. Physiatrist and rehab team continue to assess barriers to discharge/monitor patient progress toward functional and medical goals.  Function:  Bathing Bathing position   Position: Wheelchair/chair at sink  Bathing parts Body parts bathed by patient: Right arm, Left arm, Chest, Abdomen, Right upper leg, Left upper leg Body parts bathed by helper: Right lower leg, Left lower leg, Back  Bathing assist Assist Level: Touching or steadying assistance(Pt > 75%)      Upper Body Dressing/Undressing Upper body dressing   What is the patient wearing?: Hospital gown                Upper body assist Assist Level: Set up, Supervision or verbal cues   Set up : To obtain clothing/put away  Lower Body Dressing/Undressing Lower body dressing   What is the patient wearing?: Non-skid slipper socks           Non-skid slipper socks- Performed by helper: Don/doff right sock, Don/doff left sock                  Lower body assist        Toileting Toileting Toileting activity did not occur: No continent bowel/bladder event Toileting steps completed by patient: Adjust clothing prior to toileting Toileting steps completed by helper: Adjust clothing prior to  toileting, Performs perineal hygiene, Adjust clothing after toileting Toileting Assistive Devices: Grab bar or rail  Toileting assist     Transfers Chair/bed transfer   Chair/bed transfer method: Stand pivot Chair/bed transfer assist level: Moderate assist (Pt 50 - 74%/lift or lower) Chair/bed transfer assistive device: Walker, Designer, fashion/clothingArmrests     Locomotion Ambulation     Max distance: 5810ft  Assist level: Touching or steadying assistance (Pt > 75%)   Wheelchair   Type: Manual Max wheelchair distance: 150 Assist Level: Total assistance (Pt < 25%)  Cognition Comprehension Comprehension assist level: Understands basic 75 - 89% of the time/ requires cueing 10 - 24% of the time  Expression Expression assist level: Expresses  basic 75 - 89% of the time/requires cueing 10 - 24% of the time. Needs helper to occlude trach/needs to repeat words.  Social Interaction Social Interaction assist level: Interacts appropriately 50 - 74% of the time - May be physically or verbally inappropriate.  Problem Solving Problem solving assist level: Solves basic 75 - 89% of the time/requires cueing 10 - 24% of the time  Memory Memory assist level: Recognizes or recalls 50 - 74% of the time/requires cueing 25 - 49% of the time    Medical Problem List and Plan: 1.  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 Continue BiPAP and oxygen therapy.   Taper prednisone as directed with slow wean  Cont CIR 2. DVT Prophylaxis/Anticoagulation: Subcutaneous Lovenox. Monitor platelet counts and any signs of bleeding. Recent vascular studies negative. 3. Pain Management/chronic back pain: MSIR 15 mg twice a day as needed 4. Mood/bipolar disorder: Remeron 45 mg daily at bedtime, valproic acid 500 mg twice a day, Ativan 0.5 mg 3 times a day as needed 5. Neuropsych: This patient is capable of making decisions on her own behalf. 6. Skin/Wound Care: Routine skin checks 7. Fluids/Electrolytes/Nutrition: Routine I&Os 8. Hypertension. Labetalol 300 mg 3 times a day, Norvasc 10 mg daily.   Controlled 12/22  Monitor with increased mobility 9. Acute on chronic diastolic congestive heart failure.   Lasix 40 mg twice a day.   Monitor for any signs of fluid overload 10. Diabetes mellitus with peripheral neuropathy. Hemoglobin A1c 6.5.   Check blood sugars before meals and at bedtime.  Lantus insulin 16 units daily.   CBGs slightly labile yesterday, but overall controlled  Will monitor with increased activity. 11. Chronic granulomatous disease. Patient on chronic Bactrim lifelong prior to admission and continued 12. Hyperlipidemia. Lipitor 13. GERD. Protonix 14.  Hypoalbuminemia  Supplement started 12/20 15. ABLA  Hb 10.9 on 12/20  Cont to monitor 16. Urinary Incontinence  D/ced foley 12/20  PVRs reviewed  Timed voiding 17. Obesity  Body mass index is 33.76 kg/m.  Diet and exercise education  Will cont to encourage weight loss to increase endurance and promote overall health 18. HCAP with acute respiratory failure  Monitor Voriconazole levels  LOS (Days) 3 A FACE TO FACE EVALUATION WAS PERFORMED  Ankit Karis Jubanil Patel 10/01/2016 9:51 AM

## 2016-10-01 NOTE — Patient Care Conference (Signed)
Inpatient RehabilitationTeam Conference and Plan of Care Update Date: 09/11/2016   Time: 2:45 PM    Patient Name: Cheryl NissenJonce Ann Mcclane      Medical Record Number: [id number]  Date of Birth: 1966/01/06 Sex: Female         Room/Bed: 4W11C/4W11C-01 Payor Info: Payor: [employer] MEDICARE / Plan: Monia Pouch[employer]ETNA MEDICARE HMO/PPO / Product Type: *No Product type* /    Admitting Diagnosis: resp failure  debility   Admit Date/Time:  09/28/2016  5:05 PM Admission Comments: No comment available   Primary Diagnosis:  <principal problem not specified> Principal Problem: <principal problem not specified>  Patient Active Problem List   Diagnosis Date Noted  . Diabetes mellitus type 2 in obese (HCC)   . Labile blood glucose   . Acute on chronic respiratory failure with hypoxia (HCC)   . Debilitated 09/28/2016  . Difficulty in walking, not elsewhere classified   . Chronic pain syndrome   . Gastroesophageal reflux disease without esophagitis   . Hyperlipidemia   . Fungal infection 09/27/2016  . Invasive aspergillosis (HCC)   . Acute respiratory failure with hypoxemia (HCC) 09/17/2016  . Chronic granulomatous disease (HCC)   . HCAP (healthcare-associated pneumonia)   . Benign essential HTN   . Type 2 diabetes mellitus with peripheral neuropathy (HCC)   . Respiratory failure (HCC)   . Opacity of lung on imaging study   . Chronic obstructive pulmonary disease (HCC)   . Bipolar affective disorder (HCC)   . Essential hypertension   . Acute on chronic diastolic heart failure (HCC)   . Urinary incontinence   . Acute blood loss anemia   . Hypoalbuminemia due to protein-calorie malnutrition (HCC)   . 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 COPD exacerbation (HCC) 09/06/2016  . Granulomatous disease (HCC) 09/06/2016  . Diastolic heart failure (HCC) 09/06/2016  . DM (diabetes mellitus) (HCC) 09/06/2016  . HTN (hypertension) 09/06/2016  . Bipolar disorder (HCC) 09/06/2016  . Anxiety 09/06/2016  . Chronic back pain  09/06/2016  . OSA (obstructive sleep apnea) 09/06/2016  . Obesity hypoventilation syndrome (HCC) 09/06/2016  . Acute pulmonary edema (HCC)   . CAP (community acquired pneumonia)   . Dyspnea   . Acute respiratory failure with hypoxia (HCC) 08/26/2016    Expected Discharge Date: Expected Discharge Date: 10/16/16  Team Members Present: Physician leading conference: Dr. Maryla MorrowAnkit Patel Social Worker Present: Amada JupiterLucy Zillah Alexie, LCSW Nurse Present: Carmie EndAngie Joyce, RN PT Present: Edman CircleAudra Hall, PT OT Present: Callie FieldingKatie Pittman, OT SLP Present: Feliberto Gottronourtney Payne, SLP PPS Coordinator present : Tora DuckMarie Noel, RN, CRRN     Current Status/Progress Goal Weekly Team Focus  Medical   Debilitation with functional deficits secondary to debility after VDRF/COPD exacerbation and CIM and currently maintained on voriconazole.   Improve mobility, endurance, DM, ABLA  See aove   Bowel/Bladder   foley removed 12/20. Voiding trial began. Pt incontienent of bowel. LBM 09/28/16  min assist  Monitor q shift.    Swallow/Nutrition/ Hydration             ADL's   mod - max A overall with self care tasks, mod A stand pivot transfers  overall supervision  actiivty tolerance, strengthening, pt/family education, self care, functional transfers   Mobility   Mod-max A   Supervision  activity tolerance/endurance, LE strengthening for transfers, gait and stairs   Communication             Safety/Cognition/ Behavioral Observations            Pain  complains of headache and occasional back pain. Morphine IR 2x day and tyelenol prn   pain less than or equal to 2  monitor for pain and treat accordingly    Skin   Clean, dry, intact   no new skin breakdown   monitor q shift and prn     Rehab Goals Patient on target to meet rehab goals: Yes *See Care Plan and progress notes for long and short-term goals.  Barriers to Discharge: Mobility, endurance, ABLA, DM, COPD    Possible Resolutions to Barriers:  Cont antifungals, therapies,  optimize DM meds    Discharge Planning/Teaching Needs:  Plan for pt to d/c home with spouse able to provide 24/7 assistance  Teaching to be scheduled with spouse prior to d/c.   Team Discussion:  Returning to CIR.  Medically much better with this admit.  Maintaining O2 on 3L and not as SOB.  Mod-max assist tfs on eval and amb short distances.  Goals set for supervision overall  Revisions to Treatment Plan:  None   Continued Need for Acute Rehabilitation Level of Care: The patient requires daily medical management by a physician with specialized training in physical medicine and rehabilitation for the following conditions: Daily direction of a multidisciplinary physical rehabilitation program to ensure safe treatment while eliciting the highest outcome that is of practical value to the patient.: Yes Daily medical management of patient stability for increased activity during participation in an intensive rehabilitation regime.: Yes Daily analysis of laboratory values and/or radiology reports with any subsequent need for medication adjustment of medical intervention for : Pulmonary problems  Kent Riendeau 10/01/2016, 2:00 PM

## 2016-10-02 ENCOUNTER — Inpatient Hospital Stay (HOSPITAL_COMMUNITY): Payer: Medicare HMO | Admitting: Occupational Therapy

## 2016-10-02 ENCOUNTER — Inpatient Hospital Stay (HOSPITAL_COMMUNITY): Payer: Medicare HMO | Admitting: *Deleted

## 2016-10-02 LAB — GLUCOSE, CAPILLARY
GLUCOSE-CAPILLARY: 107 mg/dL — AB (ref 65–99)
GLUCOSE-CAPILLARY: 118 mg/dL — AB (ref 65–99)
GLUCOSE-CAPILLARY: 388 mg/dL — AB (ref 65–99)
GLUCOSE-CAPILLARY: 177 mg/dL — AB (ref 65–99)
Glucose-Capillary: 82 mg/dL (ref 65–99)

## 2016-10-02 NOTE — Progress Notes (Signed)
Occupational Therapy Session Note  Patient Details  Name: Cheryl Wheeler MRN: [id number] Date of Birth: [birth year]  Today's Date: 10/02/2016 OT Individual Time:  - 8295-6213- 1035-1203 Individual Treatment Time Calculation: 88 min    Short Term Goals: Week 1:  OT Short Term Goal 1 (Week 1): Pt will complete bathing with Min A and AE PRN OT Short Term Goal 2 (Week 1): Pt will complete LB dressing with Mod A  OT Short Term Goal 3 (Week 1): Pt will stand for 2 minutes while engaged in unilaterally involved task to increase independence with LB self care OT Short Term Goal 4 (Week 1): Pt will complete BSC transfer and LRAD with Min A  Skilled Therapeutic Interventions/Progress Updates:   Pt was sitting in w/c at time of arrival. Declined ADLs but agreeable to tx off of unit.02 sats at rest 97% on 3L. Pt self propelled to gift shop with extra time and slow pace in order to increase endurance, psychosocial wellbeing, and UB strength. Pt  navigated w/c around tight spaces and aisles, was provided education on w/c safety when leaning forward to pick up items from shelves. Sit<stands completed at display cases with Min A. Longest standing time without rest 2 minutes with unilateral support on glass while pt used one extremity to touch jewelry and rotate glass displays. Afterwards pt self propelled back to room in manner as written above. She was left in care of nursing for brief change at time of departure. 02 sats stable at 95-97% on 3L throughout tx today.    Therapy Documentation Precautions:  Precautions Precautions: Fall Precaution Comments: Monitor 02 sats Restrictions Weight Bearing Restrictions: No  Pain: No c/o pain during session    ADL:      See Function Navigator for Current Functional Status.   Therapy/Group: Individual Therapy  Amanpreet Delmont A Paddy Walthall 10/02/2016, 7:41 PM

## 2016-10-02 NOTE — Progress Notes (Signed)
PHYSICAL MEDICINE & REHABILITATION     PROGRESS NOTE  Subjective/Complaints:  Pt seen sitting up in bed this AM.  She slept well overnight, but still feels a little tired this AM.   ROS: Denies CP, SOB, N/V/D.  Objective: Vital Signs: Blood pressure 140/68, pulse 74, temperature 98.1 F (36.7 C), temperature source Oral, resp. rate 16, height 4' 11.5" (1.511 m), weight 77.2 kg (170 lb 3.1 oz), SpO2 93 %. No results found. No results for input(s): WBC, HGB, HCT, PLT in the last 72 hours. No results for input(s): NA, K, CL, GLUCOSE, BUN, CREATININE, CALCIUM in the last 72 hours.  Invalid input(s): CO CBG (last 3)   Recent Labs  10/01/16 1736 10/01/16 2202 10/02/16 0625  GLUCAP 305* 190* 107*    Wt Readings from Last 3 Encounters:  10/02/16 77.2 kg (170 lb 3.1 oz)  09/28/16 82.7 kg (182 lb 5.1 oz)  09/17/16 81.2 kg (179 lb 1.6 oz)    Physical Exam:  BP 140/68   Pulse 74   Temp 98.1 F (36.7 C) (Oral)   Resp 16   Ht 4' 11.5" (1.511 m)   Wt 77.2 kg (170 lb 3.1 oz)   SpO2 93%   BMI 33.80 kg/m  Constitutional: She appears well-developed. NAD. HENT: Normocephalic and atraumatic.  Eyes: EOM are normal. No discharge.  Cardiovascular: RRR. No JVD. Respiratory: Unlabored. Clear. +[place]  GI: Soft. Bowel sounds are normal.  Musculoskeletal: She exhibits no edema or tenderness.  Neurological: She is alert and oriented.  Follows commands Motor: 4+/5 throughout (unchanged) Skin: Skin is warm and dry.  Psychiatric: She has a normal mood and affect. Her behavior is normal.   Assessment/Plan: 1. Functional deficits secondary to debility which require 3+ hours per day of interdisciplinary therapy in a comprehensive inpatient rehab setting. Physiatrist is providing close team supervision and 24 hour management of active medical problems listed below. Physiatrist and rehab team continue to assess barriers to discharge/monitor patient progress toward functional and medical  goals.  Function:  Bathing Bathing position   Position: Wheelchair/chair at sink  Bathing parts Body parts bathed by patient: Right arm, Left arm, Chest, Abdomen, Right upper leg, Left upper leg, Right lower leg, Left lower leg, Front perineal area, Back Body parts bathed by helper: Right lower leg, Left lower leg, Back  Bathing assist Assist Level: Touching or steadying assistance(Pt > 75%)      Upper Body Dressing/Undressing Upper body dressing   What is the patient wearing?: Pull over shirt/dress     Pull over shirt/dress - Perfomed by patient: Thread/unthread right sleeve, Thread/unthread left sleeve, Put head through opening, Pull shirt over trunk          Upper body assist Assist Level: Supervision or verbal cues   Set up : To obtain clothing/put away  Lower Body Dressing/Undressing Lower body dressing   What is the patient wearing?: Underwear, Pants, Ted Hose, Non-skid slipper socks Underwear - Performed by patient: Thread/unthread left underwear leg, Thread/unthread right underwear leg, Pull underwear up/down   Pants- Performed by patient: Thread/unthread right pants leg, Pull pants up/down Pants- Performed by helper: Thread/unthread left pants leg Non-skid slipper socks- Performed by patient: Don/doff right sock, Don/doff left sock Non-skid slipper socks- Performed by helper: Don/doff right sock, Don/doff left sock               TED Hose - Performed by helper: Don/doff right TED hose, Don/doff left TED hose  Lower body assist Assist  for lower body dressing: Touching or steadying assistance (Pt > 75%)      Toileting Toileting Toileting activity did not occur: No continent bowel/bladder event Toileting steps completed by patient: Adjust clothing prior to toileting Toileting steps completed by helper: Performs perineal hygiene, Adjust clothing after toileting Toileting Assistive Devices: Grab bar or rail  Toileting assist     Transfers Chair/bed transfer    Chair/bed transfer method: Stand pivot Chair/bed transfer assist level: Moderate assist (Pt 50 - 74%/lift or lower) Chair/bed transfer assistive device: Armrests, Patent attorneyWalker     Locomotion Ambulation     Max distance: 8710ft  Assist level: Touching or steadying assistance (Pt > 75%)   Wheelchair   Type: Manual Max wheelchair distance: 150 Assist Level: Total assistance (Pt < 25%)  Cognition Comprehension Comprehension assist level: Understands basic 75 - 89% of the time/ requires cueing 10 - 24% of the time  Expression Expression assist level: Expresses basic 75 - 89% of the time/requires cueing 10 - 24% of the time. Needs helper to occlude trach/needs to repeat words.  Social Interaction Social Interaction assist level: Interacts appropriately 50 - 74% of the time - May be physically or verbally inappropriate.  Problem Solving Problem solving assist level: Solves basic 75 - 89% of the time/requires cueing 10 - 24% of the time  Memory Memory assist level: Recognizes or recalls 50 - 74% of the time/requires cueing 25 - 49% of the time    Medical Problem List and Plan: 1.  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 Continue BiPAP and oxygen therapy.   Taper prednisone as directed with slow wean  Cont CIR 2. DVT Prophylaxis/Anticoagulation: Subcutaneous Lovenox. Monitor platelet counts and any signs of bleeding. Recent vascular studies negative. 3. Pain Management/chronic back pain: MSIR 15 mg twice a day as needed 4. Mood/bipolar disorder: Remeron 45 mg daily at bedtime, valproic acid 500 mg twice a day, Ativan 0.5 mg 3 times a day as needed 5. Neuropsych: This patient is capable of making decisions on her own behalf. 6. Skin/Wound Care: Routine skin checks 7. Fluids/Electrolytes/Nutrition: Routine I&Os 8. Hypertension. Labetalol 300 mg 3 times a day, Norvasc 10 mg daily.   Controlled  12/23  Monitor with increased mobility 9. Acute on chronic diastolic congestive heart failure.   Lasix 40 mg twice a day.   Monitor for any signs of fluid overload 10. Diabetes mellitus with peripheral neuropathy. Hemoglobin A1c 6.5.    Check blood sugars before meals and at bedtime.  Lantus insulin 16 units daily.   Elevated readings, but will maintain dose until completion of prednisone wean  Will monitor with increased activity. 11. Chronic granulomatous disease. Patient on chronic Bactrim lifelong prior to admission and continued 12. Hyperlipidemia. Lipitor 13. GERD. Protonix 14. Hypoalbuminemia  Supplement started 12/20 15. ABLA  Hb 10.9 on 12/20  Cont to monitor 16. Urinary Incontinence  D/ced foley 12/20  PVRs reviewed  Timed voiding, spoke to nursing, increased time to q4 hours 17. Obesity  Body mass index is 33.8 kg/m.  Diet and exercise education  Will cont to encourage weight loss to increase endurance and promote overall health 18. HCAP with acute respiratory failure  Monitor Voriconazole levels  LOS (Days) 4 A FACE TO FACE EVALUATION WAS PERFORMED  Nana [place]ton Karis Jubanil Mathilde Mcwherter 10/02/2016 10:24 AM

## 2016-10-02 NOTE — Progress Notes (Signed)
Placed patient on BIPAP for the night with IPAP set at 14cm and EPAP set at 6cm. Oxygen set at 3lpm

## 2016-10-02 NOTE — Progress Notes (Signed)
Physical Therapy Session Note  Patient Details  Name: Cheryl Wheeler MRN: [id number] Date of Birth: Apr 06, 1966  Today's Date: 10/02/2016      Skilled Therapeutic Interventions/Progress Updates:  Session I [phone] (60 min)   Patient in bed ,finishing her breakfast , agrees to therapy interventions. Patient complains of all over pain, pain medicine not due yet. Activities in supine to perform hygiene tasks, with max A, rolling with supervision, coming to sit EOB with Supervision, min A for donning pants and Supervision for shirt. Transfer with RW to w/c with min A.  w/c propulsion to therapy gym with no rest break, B UE.  NuStep x 10 min , transfer to machine with min A, activity performed in order to increase strength and faciliatte activity tolerance.  Training in sit to stand from mat table. Static standing x 3 ,able to tolerate up to 2 min standing, w/o fatigue, VC for posture and proprer breathing technique.  Patient was able to propel back to her room with B UE.  Saturation monitored throuout the session with readings >90% on #L/min O2 via [place].  At the end of session left in room witting in w/c with all needs within reach.   Session II 1300-1345 (45min )  Patient sitting in w/c agrees to therapy interventions. Training in stair negotiation 1 x 4 steps , B rails, 4 inch steps with min, step to pattern. Patient very fatigued needs increased rest time to recover.  Strengthening exercises for B LE. SLR 2 x 15 , Knee/hip resisted extension 2 x10, seated LAQ 2 x15, hamstring curls 2 x10. Resistance applied manually and with theraband.  Patient returned to room, transfer to bed with min A. Education on breathing techniques and exercises.  Patient did present with high leel of anxiety during session , admits to fear of falling.   Positioned to comfort, all needs within reach.  Therapy Documentation Precautions:  Precautions Precautions: Fall Precaution Comments: Monitor 02  sats Restrictions Weight Bearing Restrictions: No   See Function Navigator for Current Functional Status.   Therapy/Group: Individual Therapy  Dorna MaiCzajkowska, [name] 10/02/2016, 2:02 PM

## 2016-10-03 ENCOUNTER — Inpatient Hospital Stay (HOSPITAL_COMMUNITY): Payer: Medicare HMO

## 2016-10-03 ENCOUNTER — Inpatient Hospital Stay (HOSPITAL_COMMUNITY): Payer: Medicare HMO | Admitting: Physical Therapy

## 2016-10-03 DIAGNOSIS — K5903 Drug induced constipation: Secondary | ICD-10-CM

## 2016-10-03 LAB — GLUCOSE, CAPILLARY
GLUCOSE-CAPILLARY: 104 mg/dL — AB (ref 65–99)
GLUCOSE-CAPILLARY: 176 mg/dL — AB (ref 65–99)
GLUCOSE-CAPILLARY: 241 mg/dL — AB (ref 65–99)
GLUCOSE-CAPILLARY: 209 mg/dL — AB (ref 65–99)

## 2016-10-03 MED ORDER — INSULIN ASPART 100 UNIT/ML ~~LOC~~ SOLN
4.0000 [IU] | Freq: Three times a day (TID) | SUBCUTANEOUS | Status: DC
  Administered 2016-10-03 (×2): 4 [IU] via SUBCUTANEOUS

## 2016-10-03 MED ORDER — MAGNESIUM HYDROXIDE 400 MG/5ML PO SUSP
30.0000 mL | Freq: Once | ORAL | Status: AC
  Administered 2016-10-03: 30 mL via ORAL
  Filled 2016-10-03 (×2): qty 30

## 2016-10-03 MED ORDER — SENNOSIDES-DOCUSATE SODIUM 8.6-50 MG PO TABS
1.0000 | ORAL_TABLET | Freq: Every day | ORAL | Status: DC
  Administered 2016-10-03 – 2016-10-09 (×7): 1 via ORAL
  Filled 2016-10-03 (×7): qty 1

## 2016-10-03 MED ORDER — BISACODYL 10 MG RE SUPP
10.0000 mg | Freq: Once | RECTAL | Status: AC
  Administered 2016-10-03: 10 mg via RECTAL
  Filled 2016-10-03: qty 1

## 2016-10-03 MED ORDER — BISACODYL 10 MG RE SUPP
10.0000 mg | Freq: Every day | RECTAL | Status: DC | PRN
  Administered 2016-10-06 – 2016-10-08 (×2): 10 mg via RECTAL
  Filled 2016-10-03 (×2): qty 1

## 2016-10-03 MED ORDER — INSULIN GLARGINE 100 UNIT/ML ~~LOC~~ SOLN
13.0000 [IU] | Freq: Two times a day (BID) | SUBCUTANEOUS | Status: DC
  Administered 2016-10-03 – 2016-10-07 (×8): 13 [IU] via SUBCUTANEOUS
  Filled 2016-10-03 (×9): qty 0.13

## 2016-10-03 MED ORDER — POLYETHYLENE GLYCOL 3350 17 G PO PACK
17.0000 g | PACK | Freq: Every day | ORAL | Status: DC
  Administered 2016-10-03 – 2016-10-13 (×11): 17 g via ORAL
  Filled 2016-10-03 (×14): qty 1

## 2016-10-03 NOTE — Progress Notes (Signed)
Physical Therapy Session Note  Patient Details  Name: Cheryl Wheeler MRN: [id number] Date of Birth: 11/08/1965  Today's Date: 10/03/2016 PT Individual Time: 0922-1030 PT Individual Time Calculation (min): 68 min  and Today's Date: 10/03/2016 PT Concurrent Time: 0915-0922 PT Concurrent Time Calculation (min): 7 min    Short Term Goals:Week 1:  PT Short Term Goal 1 (Week 1): Pt will perform bed mobility on flat bed with min A PT Short Term Goal 2 (Week 1): Pt will perform sit <> stand and stand pivot with RW and min A PT Short Term Goal 3 (Week 1): Pt will perform w/c mobility x 100' with supervision for UE strengthening and endurance PT Short Term Goal 4 (Week 1): Pt will perform gait x 25' with RW and mod A PT Short Term Goal 5 (Week 1): Pt will negotiate 4 short stairs with 2 rails and mod A  Therapy Documentation Precautions:  Precautions Precautions: Fall Precaution Comments: Monitor 02 sats Restrictions Weight Bearing Restrictions: No General:   Vital Signs: Therapy Vitals Temp: 97.8 F (36.6 C) Temp Source: Oral Pulse Rate: 73 Resp: 17 BP: 122/67 Patient Position (if appropriate): Sitting Oxygen Therapy SpO2: 98 % O2 Device: Nasal Cannula O2 Flow Rate (L/min): 3 L/min Pain: Pain Assessment Pain Assessment: 0-10 Pain Score: 6  Pain Type: Chronic pain Pain Location: Back Pain Orientation: Lower Pain Descriptors / Indicators: Aching Pain Frequency: Constant Pain Onset: On-going Patients Stated Pain Goal: 0 Pain Intervention(s): Medication (See eMAR) Multiple Pain Sites: No  Sit to and to and from stand transfer with RW mod assist progressed to min assist with proper sequence and technique. Manual facilitation for anterior weight shift.  Patient ambulated 20 feetx2 with RW min assist. Patient ambulated with astep through gait pattern. Verbal cues for pacing breathing strategies and upright posture. Patient limited by fatigue.  Patient up and down 4 steps  with bilateral handrails 4 inch steps min assist. Patient performed stairs with a step to pattern. Patient with increased anxiety during stair negotiation. Patient able to recall proper sequence and technique.   Tai Chi exercises 10 minutes focusing on controlled breathing, coordination, strengthening and cardiovascular endurance.   Seated there ex with 3 pound cuff weight: B LAQ 3x10 B hip flexion 3x10   Patient tolerated session well with frequent and prolonged rest breaks throughout session. Vitals monitored and remained stable throughout session and patient remained on 3LO2 via nasal cannula throughout session. Patient returned to room at end of session with all needs met.     See Function Navigator for Current Functional Status.   Therapy/Group: Individual Therapy and Concurent   Retta Diones 10/03/2016, 9:27 AM

## 2016-10-03 NOTE — Progress Notes (Signed)
Occupational Therapy Session Note  Patient Details  Name: Cheryl Wheeler MRN: [id number] Date of Birth: [birth year]  Today's Date: 10/03/2016 OT Individual Time: 5284-13240800-0915 OT Individual Time Calculation (min): 75 min   Short Term Goals: Week 1:  OT Short Term Goal 1 (Week 1): Pt will complete bathing with Min A and AE PRN OT Short Term Goal 2 (Week 1): Pt will complete LB dressing with Mod A  OT Short Term Goal 3 (Week 1): Pt will stand for 2 minutes while engaged in unilaterally involved task to increase independence with LB self care OT Short Term Goal 4 (Week 1): Pt will complete BSC transfer and LRAD with Min A  Skilled Therapeutic Interventions/Progress Updates:   ADL-retraining at sink with focus on improved endurance, static standing balance, transfers, and re-ed on energy conservation.   Pt received supine in bed after having finished her breakfast.   Pt refuses shower level BADL due to preference to bathe at sink.  With setup to place w/c and RW, pt completes bed mobility using HOB elevated and bedrails unassisted.   Pt completes stand-pivot transfer with standby assist and self-propels w/c to sink.  OT re-educates pt on treatment goals and pt performs bathing, sitting in w/c at sink with extra time, setup, and 2 rest breaks, using LH sponge to wash her back.    Pt reports voiding urine in brief and requests assist with removing brief while she stands.   Pt cleanses periarea; OT washes her buttocks and pt returns to sit to don pants and socks.   Pt stands again to don pants with mod assist to pull up over hips and sustain standing balance.   Pt grooms unassisted and is escorted to gym for physical therapy session at end of session.   02 sats were recorded at end of session, 92% on 3L, HR 87 bpm.  Therapy Documentation Precautions:  Precautions Precautions: Fall Precaution Comments: Monitor 02 sats Restrictions Weight Bearing Restrictions: No   Vital Signs: Therapy Vitals Temp:  97.8 F (36.6 C) Temp Source: Oral Pulse Rate: 73 Resp: 17 BP: 122/67 Patient Position (if appropriate): Sitting Oxygen Therapy SpO2: 98 % O2 Device: Nasal Cannula O2 Flow Rate (L/min): 3 L/min   Pain: Pain Assessment Pain Assessment: 0-10 Pain Score: 6  Pain Type: Chronic pain Pain Location: Back Pain Orientation: Lower Pain Descriptors / Indicators: Aching Pain Frequency: Constant Pain Intervention(s): Medication (See eMAR) Multiple Pain Sites: No  See Function Navigator for Current Functional Status.   Therapy/Group: Individual Therapy   Second session: Time: 1400-1445 Time Calculation (min):   45 min  Pain Assessment: 6/10 low back, continuous, medications   Skilled Therapeutic Interventions: ADL-retraining with focus on toilet transfer, toileting, shower transfers and pt ed on use of DME to improve independence with shower transfers and toileting using bidet and suction cup grab bars.  Pt received in her room seated in her w/c.  Pt was oriented to plan of session to perform seated and standing balance tasks in rehab gym.   Pt propelled w/c 150' independently before requesting assist d/t fatigue.   Pt completed transfer to raised mat but was noted with oder from possible BM.   Pt accepted escort to ADL apartment bathroom where BM was confirmed (soiled brief with BM ongoing).   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   Pt reports that her husband assists  with her hygiene but she was receptive to exploring use of bidet if feasible after OT educated pt on use of device.   Pt rested and then completed simulated transfer to step-on shower as she has at home with OT assist to simulate grab bars.  Pt was then educated on use of suction cup grab bar and wall-mounted grab bar to provide improved stability during transfers.  At pt's request OT printed examples of devices  recommended [facility] which is convenient for her husband to shop.  OT advised use of paid contactors for assist with installation of bidet and wall mounted grab bar, [facility] as pt reports her husband is not handy with home repairs.  Pt was then returned to her room in w/c to await RN tech assist back to bed when needed.   Therapy/Group: Individual Therapy  Letha Mirabal 10/03/2016, 9:11 AM

## 2016-10-03 NOTE — Progress Notes (Signed)
[facility]IC[facility] & RE[facility]ABILITATION     PROGRESS NOTE  Subjective/Complaints:  Pt sitting up in bed this AM.  She slept well overnight.  She complains of constipation this AM.    ROS: +Contsiptaion. Denies CP, SOB, N/V/D.  Objective: Vital Signs: Blood pressure 122/67, pulse 73, temperature 97.8 F (36.6 C), temperature source Oral, resp. rate 17, height 4' 11.5" (1.511 m), weight 77.2 kg (170 lb 3.1 oz), SpO2 98 %. No results found. No results for input(s): WBC, HGB, HCT, PLT in the last 72 hours. No results for input(s): NA, K, CL, GLUCOSE, BUN, CREATININE, CALCIUM in the last 72 hours.  Invalid input(s): CO CBG (last 3)   Recent Labs  10/02/16 1849 10/02/16 2053 10/03/16 0644  GLUCAP 177* 82 104*    Wt Readings from Last 3 Encounters:  10/03/16 77.2 kg (170 lb 3.1 oz)  09/28/16 82.7 kg (182 lb 5.1 oz)  09/17/16 81.2 kg (179 lb 1.6 oz)    Physical Exam:  BP 122/67 (BP Location: Right Arm)   Pulse 73   Temp 97.8 F (36.6 C) (Oral)   Resp 17   Ht 4' 11.5" (1.511 m)   Wt 77.2 kg (170 lb 3.1 oz)   SpO2 98%   BMI 33.80 kg/m  Constitutional: She appears well-developed. NAD. HENT: Normocephalic and atraumatic.  Eyes: EOM are normal. No discharge.  Cardiovascular: RRR. No JVD. Respiratory: Unlabored. Clear. +[place]  GI: Soft. Bowel sounds are normal.  Musculoskeletal: She exhibits no edema or tenderness.  Neurological: She is alert and oriented.  Follows commands Motor: 4+/5 throughout (unchanged) Skin: Skin is warm and dry.  Psychiatric: She has a normal mood and affect. Her behavior is normal.   Assessment/Plan: 1. Functional deficits secondary to debility which require 3+ hours per day of interdisciplinary therapy in a comprehensive inpatient rehab setting. Physiatrist is providing close team supervision and 24 hour management of active medical problems listed below. Physiatrist and rehab team continue to assess barriers to discharge/monitor patient  progress toward functional and medical goals.  Function:  Bathing Bathing position   Position: (P) Wheelchair/chair at sink  Bathing parts Body parts bathed by patient: (P) Right arm, Left arm, Chest, Abdomen, Front perineal area, Right upper leg, Left upper leg, Right lower leg, Left lower leg, Back Body parts bathed by helper: (P) Buttocks  Bathing assist Assist Level: (P) Touching or steadying assistance(Pt > 75%)   Set up : (P) To obtain items (Butt cream)  Upper Body Dressing/Undressing Upper body dressing   What is the patient wearing?: (P) Pull over shirt/dress     Pull over shirt/dress - Perfomed by patient: (P) Thread/unthread right sleeve, Thread/unthread left sleeve, Put head through opening, Pull shirt over trunk          Upper body assist Assist Level: (P) Supervision or verbal cues   Set up : (P) To obtain clothing/put away  Lower Body Dressing/Undressing Lower body dressing   What is the patient wearing?: (P) Underwear, Pants, Non-skid slipper socks, Ted Hose Underwear - Performed by patient: Thread/unthread left underwear leg, Thread/unthread right underwear leg, Pull underwear up/down   Pants- Performed by patient: Thread/unthread right pants leg, Pull pants up/down Pants- Performed by helper: Thread/unthread left pants leg Non-skid slipper socks- Performed by patient: Don/doff right sock, Don/doff left sock Non-skid slipper socks- Performed by helper: Don/doff right sock, Don/doff left sock               TED Hose - Performed by  helper: Don/doff right TED hose, Don/doff left TED hose  Lower body assist Assist for lower body dressing: Touching or steadying assistance (Pt > 75%)      Toileting Toileting Toileting activity did not occur: No continent bowel/bladder event Toileting steps completed by patient: Adjust clothing prior to toileting Toileting steps completed by helper: Performs perineal hygiene, Adjust clothing after toileting Toileting  Assistive Devices: Grab bar or rail  Toileting assist     Transfers Chair/bed transfer   Chair/bed transfer method: Stand pivot Chair/bed transfer assist level: Moderate assist (Pt 50 - 74%/lift or lower) Chair/bed transfer assistive device: Armrests, Patent attorneyWalker     Locomotion Ambulation     Max distance: 6810ft  Assist level: Touching or steadying assistance (Pt > 75%)   Wheelchair   Type: Manual Max wheelchair distance: 150 Assist Level: Total assistance (Pt < 25%)  Cognition Comprehension Comprehension assist level: Understands basic 75 - 89% of the time/ requires cueing 10 - 24% of the time  Expression Expression assist level: Expresses basic 75 - 89% of the time/requires cueing 10 - 24% of the time. Needs helper to occlude trach/needs to repeat words.  Social Interaction Social Interaction assist level: Interacts appropriately with others with medication or extra time (anti-anxiety, antidepressant).  Problem Solving Problem solving assist level: Solves basic 75 - 89% of the time/requires cueing 10 - 24% of the time  Memory Memory assist level: Recognizes or recalls 50 - 74% of the time/requires cueing 25 - 49% of the time    Medical Problem List and Plan: 1.  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 Continue BiPAP and oxygen therapy.   Taper prednisone as directed with slow wean  Cont CIR 2. DVT Prophylaxis/Anticoagulation: Subcutaneous Lovenox. Monitor platelet counts and any signs of bleeding. Recent vascular studies negative. 3. Pain Management/chronic back pain: MSIR 15 mg twice a day as needed 4. Mood/bipolar disorder: Remeron 45 mg daily at bedtime, valproic acid 500 mg twice a day, Ativan 0.5 mg 3 times a day as needed 5. Neuropsych: This patient is capable of making decisions on her own behalf. 6. Skin/Wound Care: Routine skin checks 7. Fluids/Electrolytes/Nutrition: Routine  I&Os 8. Hypertension. Labetalol 300 mg 3 times a day, Norvasc 10 mg daily.   Controlled 12/24  Monitor with increased mobility 9. Acute on chronic diastolic congestive heart failure.   Lasix 40 mg twice a day.   Monitor for any signs of fluid overload 10. Diabetes mellitus with peripheral neuropathy. Hemoglobin A1c 6.5.    Check blood sugars before meals and at bedtime.  Lantus insulin 16 units BID, decreased to 13U on 12/24.   Will add Novolog 4U 12/24  Extremely labile with readings >380  Will monitor with increased activity. 11. Chronic granulomatous disease. Patient on chronic Bactrim lifelong prior to admission and continued 12. Hyperlipidemia. Lipitor 13. GERD. Protonix 14. Hypoalbuminemia  Supplement started 12/20 15. ABLA  Hb 10.9 on 12/20  Cont to monitor 16. Urinary Incontinence  D/ced foley 12/20  PVRs reviewed  Timed voiding, spoke to nursing, increased time to q4 hours 17. Obesity  Body mass index is 33.8 kg/m.  Diet and exercise education  Will cont to encourage weight loss to increase endurance and promote overall health 18. HCAP with acute respiratory failure  Monitor Voriconazole levels 19. Constipation  Bowel reg increased on 12/24  LOS (Days) 5 A FACE TO FACE EVALUATION WAS PERFORMED  Rober Skeels Karis Jubanil Ido Wollman 10/03/2016 9:08 AM

## 2016-10-04 LAB — GLUCOSE, CAPILLARY
GLUCOSE-CAPILLARY: 132 mg/dL — AB (ref 65–99)
Glucose-Capillary: 105 mg/dL — ABNORMAL HIGH (ref 65–99)
Glucose-Capillary: 170 mg/dL — ABNORMAL HIGH (ref 65–99)
Glucose-Capillary: 207 mg/dL — ABNORMAL HIGH (ref 65–99)

## 2016-10-04 MED ORDER — INSULIN ASPART 100 UNIT/ML ~~LOC~~ SOLN
7.0000 [IU] | Freq: Three times a day (TID) | SUBCUTANEOUS | Status: DC
  Administered 2016-10-04 – 2016-10-05 (×3): 7 [IU] via SUBCUTANEOUS

## 2016-10-04 NOTE — Progress Notes (Signed)
[name]IC[facility] RE[facility]ILIT[facility]     PROGRESS NOTE  Subjective/Complaints:  Pt sitting up in bed this AM.  She slept well overnight.  She is looking forward to her husband coming today and spending time with her.  ROS: Denies CP, SOB, N/V/D.  Objective: Vital Signs: Blood pressure 122/66, pulse 72, temperature 97.7 F (36.5 C), temperature source Oral, resp. rate 18, height 4' 11.5" (1.511 m), weight 78 kg (171 lb 15.3 oz), SpO2 95 %. No results found. No results for input(s): WBC, HGB, HCT, PLT in the last 72 hours. No results for input(s): NA, K, CL, GLUCOSE, BUN, CREATININE, CALCIUM in the last 72 hours.  Invalid input(s): CO CBG (last 3)   Recent Labs  10/03/16 1635 10/03/16 2031 10/04/16 0637  GLUCAP 241* 209* 105*    Wt Readings from Last 3 Encounters:  10/04/16 78 kg (171 lb 15.3 oz)  09/28/16 82.7 kg (182 lb 5.1 oz)  09/17/16 81.2 kg (179 lb 1.6 oz)    Physical Exam:  BP 122/66 (BP Location: Left Arm)   Pulse 72   Temp 97.7 F (36.5 C) (Oral)   Resp 18   Ht 4' 11.5" (1.511 m)   Wt 78 kg (171 lb 15.3 oz)   SpO2 95%   BMI 34.15 kg/m  Constitutional: She appears well-developed. NAD. HENT: Normocephalic and atraumatic.  Eyes: EOM are normal. No discharge.  Cardiovascular: RRR. No JVD. Respiratory: Unlabored. Clear. +[place]  GI: Soft. Bowel sounds are normal.  Musculoskeletal: She exhibits no edema or tenderness.  Neurological: She is alert and oriented.  Follows commands Motor: 4+/5 throughout (stable) Skin: Skin is warm and dry.  Psychiatric: She has a normal mood and affect. Her behavior is normal.   Assessment/Plan: 1. Functional deficits secondary to debility which require 3+ hours per day of interdisciplinary therapy in a comprehensive inpatient rehab setting. Physiatrist is providing close team supervision and 24 hour management of active medical problems listed below. Physiatrist and rehab team continue to assess barriers to  discharge/monitor patient progress toward functional and medical goals.  Function:  Bathing Bathing position   Position: Wheelchair/chair at sink  Bathing parts Body parts bathed by patient: Right arm, Left arm, Chest, Abdomen, Front perineal area, Right upper leg, Left upper leg, Right lower leg, Left lower leg, Back Body parts bathed by helper: Buttocks  Bathing assist Assist Level: Touching or steadying assistance(Pt > 75%)   Set up : To obtain items (Butt cream)  Upper Body Dressing/Undressing Upper body dressing   What is the patient wearing?: Pull over shirt/dress     Pull over shirt/dress - Perfomed by patient: Thread/unthread right sleeve, Thread/unthread left sleeve, Put head through opening, Pull shirt over trunk          Upper body assist Assist Level: Supervision or verbal cues   Set up : To obtain clothing/put away  Lower Body Dressing/Undressing Lower body dressing   What is the patient wearing?: Underwear, Pants, Non-skid slipper socks, Ted Hose Underwear - Performed by patient: Thread/unthread right underwear leg, Thread/unthread left underwear leg Underwear - Performed by helper: Pull underwear up/down Pants- Performed by patient: Thread/unthread right pants leg, Thread/unthread left pants leg Pants- Performed by helper: Pull pants up/down Non-skid slipper socks- Performed by patient: Don/doff right sock, Don/doff left sock Non-skid slipper socks- Performed by helper: Don/doff right sock, Don/doff left sock               TED Hose - Performed by helper: Don/doff  right TED hose, Don/doff left TED hose  Lower body assist Assist for lower body dressing: Touching or steadying assistance (Pt > 75%)      Toileting Toileting Toileting activity did not occur: No continent bowel/bladder event Toileting steps completed by patient: Adjust clothing prior to toileting Toileting steps completed by helper: Performs perineal hygiene, Adjust clothing after  toileting Toileting Assistive Devices: Grab bar or rail  Toileting assist Assist level:  (Max assist)   Transfers Chair/bed transfer   Chair/bed transfer method: Stand pivot Chair/bed transfer assist level: Touching or steadying assistance (Pt > 75%) Chair/bed transfer assistive device: Bedrails, Armrests, Patent attorneyWalker     Locomotion Ambulation     Max distance: 1810ft  Assist level: Touching or steadying assistance (Pt > 75%)   Wheelchair   Type: Manual Max wheelchair distance: 150 Assist Level: Total assistance (Pt < 25%)  Cognition Comprehension Comprehension assist level: Understands basic 75 - 89% of the time/ requires cueing 10 - 24% of the time  Expression Expression assist level: Expresses basic 75 - 89% of the time/requires cueing 10 - 24% of the time. Needs helper to occlude trach/needs to repeat words.  Social Interaction Social Interaction assist level: Interacts appropriately with others with medication or extra time (anti-anxiety, antidepressant).  Problem Solving Problem solving assist level: Solves basic 75 - 89% of the time/requires cueing 10 - 24% of the time  Memory Memory assist level: Recognizes or recalls 50 - 74% of the time/requires cueing 25 - 49% of the time    Medical Problem List and Plan: 1.  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 Continue BiPAP and oxygen therapy.   Taper prednisone as directed with slow wean  Cont CIR 2. DVT Prophylaxis/Anticoagulation: Subcutaneous Lovenox. Monitor platelet counts and any signs of bleeding. Recent vascular studies negative. 3. Pain Management/chronic back pain: MSIR 15 mg twice a day as needed 4. Mood/bipolar disorder: Remeron 45 mg daily at bedtime, valproic acid 500 mg twice a day, Ativan 0.5 mg 3 times a day as needed 5. Neuropsych: This patient is capable of making decisions on her own behalf. 6. Skin/Wound Care: Routine  skin checks 7. Fluids/Electrolytes/Nutrition: Routine I&Os 8. Hypertension. Labetalol 300 mg 3 times a day, Norvasc 10 mg daily.   Controlled 12/25  Monitor with increased mobility 9. Acute on chronic diastolic congestive heart failure.   Lasix 40 mg twice a day.   Monitor for any signs of fluid overload 10. Diabetes mellitus with peripheral neuropathy. Hemoglobin A1c 6.5.    Check blood sugars before meals and at bedtime.  Lantus insulin 16 units BID, decreased to 13U on 12/24.   Will add Novolog 4U 12/24, increased to 7U on 12/25  Remains labile  Will monitor with increased activity. 11. Chronic granulomatous disease. Patient on chronic Bactrim lifelong prior to admission and continued 12. Hyperlipidemia. Lipitor 13. GERD. Protonix 14. Hypoalbuminemia  Supplement started 12/20 15. ABLA  Hb 10.9 on 12/20  Cont to monitor 16. Urinary Incontinence  D/ced foley 12/20  PVRs reviewed  Timed voiding, spoke to nursing, increased time to q4 hours 17. Obesity  Body mass index is 34.15 kg/m.  Diet and exercise education  Will cont to encourage weight loss to increase endurance and promote overall health 18. HCAP with acute respiratory failure  Monitor Voriconazole levels 19. Constipation  Bowel reg increased on 12/24  Improving  LOS (Days) 6 A FACE TO FACE EVALUATION WAS PERFORMED  Cheryl Wheeler  Karis Juba 10/04/2016 9:03 AM

## 2016-10-05 ENCOUNTER — Inpatient Hospital Stay (HOSPITAL_COMMUNITY): Payer: Medicare HMO | Admitting: Occupational Therapy

## 2016-10-05 ENCOUNTER — Inpatient Hospital Stay (HOSPITAL_COMMUNITY): Payer: Medicare HMO | Admitting: Physical Therapy

## 2016-10-05 LAB — GLUCOSE, CAPILLARY
GLUCOSE-CAPILLARY: 155 mg/dL — AB (ref 65–99)
GLUCOSE-CAPILLARY: 136 mg/dL — AB (ref 65–99)
Glucose-Capillary: 148 mg/dL — ABNORMAL HIGH (ref 65–99)
Glucose-Capillary: 83 mg/dL (ref 65–99)

## 2016-10-05 LAB — CREATININE, SERUM: Creatinine, Ser: 0.36 mg/dL — ABNORMAL LOW (ref 0.44–1.00)

## 2016-10-05 MED ORDER — INSULIN ASPART 100 UNIT/ML ~~LOC~~ SOLN
10.0000 [IU] | Freq: Three times a day (TID) | SUBCUTANEOUS | Status: DC
  Administered 2016-10-05 – 2016-10-14 (×27): 10 [IU] via SUBCUTANEOUS

## 2016-10-05 MED ORDER — DARIFENACIN HYDROBROMIDE ER 7.5 MG PO TB24
7.5000 mg | ORAL_TABLET | Freq: Every day | ORAL | Status: DC
  Administered 2016-10-05 – 2016-10-11 (×7): 7.5 mg via ORAL
  Filled 2016-10-05 (×8): qty 1

## 2016-10-05 NOTE — Progress Notes (Signed)
[place] PHYSICAL MEDICINE & REHABILITATION     PROGRESS NOTE  Subjective/Complaints:  Pt seen sitting up in bed this AM.  She enjoyed her time with her husband yesterday.  She slept well overnight.   ROS: Denies CP, SOB, N/V/D.  Objective: Vital Signs: Blood pressure (!) 121/48, pulse 75, temperature 98.2 F (36.8 C), temperature source Oral, resp. rate 18, height 4' 11.5" (1.511 m), weight 80.7 kg (178 lb), SpO2 98 %. No results found. No results for input(s): WBC, HGB, HCT, PLT in the last 72 hours.  Recent Labs  10/05/16 0458  CREATININE 0.36*   CBG (last 3)   Recent Labs  10/04/16 1654 10/04/16 2045 10/05/16 0657  GLUCAP 207* 132* 148*    Wt Readings from Last 3 Encounters:  10/05/16 80.7 kg (178 lb)  09/28/16 82.7 kg (182 lb 5.1 oz)  09/17/16 81.2 kg (179 lb 1.6 oz)    Physical Exam:  BP (!) 121/48 (BP Location: Right Arm)   Pulse 75   Temp 98.2 F (36.8 C) (Oral)   Resp 18   Ht 4' 11.5" (1.511 m)   Wt 80.7 kg (178 lb)   SpO2 98%   BMI 35.35 kg/m  Constitutional: She appears well-developed. NAD. HENT: Normocephalic and atraumatic.  Eyes: EOM are normal. No discharge.  Cardiovascular: RRR. No JVD. Respiratory: Unlabored. Clear. +Carey  GI: Soft. Bowel sounds are normal.  Musculoskeletal: She exhibits no edema or tenderness.  Neurological: She is alert and oriented.  Follows commands Motor: 4+/5 throughout (unchanged) Skin: Skin is warm and dry.  Psychiatric: She has a normal mood and affect. Her behavior is normal.   Assessment/Plan: 1. Functional deficits secondary to debility which require 3+ hours per day of interdisciplinary therapy in a comprehensive inpatient rehab setting. Physiatrist is providing close team supervision and 24 hour management of active medical problems listed below. Physiatrist and rehab team continue to assess barriers to discharge/monitor patient progress toward functional and medical goals.  Function:  Bathing Bathing  position   Position: Wheelchair/chair at sink  Bathing parts Body parts bathed by patient: Right arm, Left arm, Chest, Abdomen, Front perineal area, Right upper leg, Left upper leg, Right lower leg, Left lower leg, Back Body parts bathed by helper: Buttocks  Bathing assist Assist Level: Touching or steadying assistance(Pt > 75%)   Set up : To obtain items (Butt cream)  Upper Body Dressing/Undressing Upper body dressing   What is the patient wearing?: Pull over shirt/dress     Pull over shirt/dress - Perfomed by patient: Thread/unthread right sleeve, Thread/unthread left sleeve, Put head through opening, Pull shirt over trunk          Upper body assist Assist Level: Supervision or verbal cues   Set up : To obtain clothing/put away  Lower Body Dressing/Undressing Lower body dressing   What is the patient wearing?: Underwear, Pants, Non-skid slipper socks, Ted Hose Underwear - Performed by patient: Thread/unthread right underwear leg, Thread/unthread left underwear leg Underwear - Performed by helper: Pull underwear up/down Pants- Performed by patient: Thread/unthread right pants leg, Thread/unthread left pants leg Pants- Performed by helper: Pull pants up/down Non-skid slipper socks- Performed by patient: Don/doff right sock, Don/doff left sock Non-skid slipper socks- Performed by helper: Don/doff right sock, Don/doff left sock               TED Hose - Performed by helper: Don/doff right TED hose, Don/doff left TED hose  Lower body assist Assist for lower body dressing: Touching  or steadying assistance (Pt > 75%)      Toileting Toileting Toileting activity did not occur: No continent bowel/bladder event Toileting steps completed by patient: Adjust clothing prior to toileting Toileting steps completed by helper: Performs perineal hygiene, Adjust clothing after toileting Toileting Assistive Devices: Grab bar or rail  Toileting assist Assist level:  (Max assist)    Transfers Chair/bed transfer   Chair/bed transfer method: Stand pivot Chair/bed transfer assist level: Touching or steadying assistance (Pt > 75%) Chair/bed transfer assistive device: Bedrails, Armrests, Patent attorneyWalker     Locomotion Ambulation     Max distance: 4410ft  Assist level: Touching or steadying assistance (Pt > 75%)   Wheelchair   Type: Manual Max wheelchair distance: 150 Assist Level: Total assistance (Pt < 25%)  Cognition Comprehension Comprehension assist level: Understands basic 90% of the time/cues < 10% of the time  Expression Expression assist level: Expresses basic 90% of the time/requires cueing < 10% of the time.  Social Interaction Social Interaction assist level: Interacts appropriately with others - No medications needed.  Problem Solving Problem solving assist level: Solves basic problems with no assist  Memory Memory assist level: Recognizes or recalls 75 - 89% of the time/requires cueing 10 - 24% of the time    Medical Problem List and Plan: 1.  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 Continue BiPAP and oxygen therapy.   Taper prednisone as directed with slow wean  Cont CIR 2. DVT Prophylaxis/Anticoagulation: Subcutaneous Lovenox. Monitor platelet counts and any signs of bleeding. Recent vascular studies negative. 3. Pain Management/chronic back pain: MSIR 15 mg twice a day as needed 4. Mood/bipolar disorder: Remeron 45 mg daily at bedtime, valproic acid 500 mg twice a day, Ativan 0.5 mg 3 times a day as needed 5. Neuropsych: This patient is capable of making decisions on her own behalf. 6. Skin/Wound Care: Routine skin checks 7. Fluids/Electrolytes/Nutrition: Routine I&Os 8. Hypertension. Labetalol 300 mg 3 times a day, Norvasc 10 mg daily.   Controlled 12/26  Monitor with increased mobility 9. Acute on chronic diastolic congestive heart failure.   Lasix 40 mg  twice a day, Cr. Stable 12/26  Monitor for any signs of fluid overload 10. Diabetes mellitus with peripheral neuropathy. Hemoglobin A1c 6.5.    Check blood sugars before meals and at bedtime.  Lantus insulin 16 units BID, decreased to 13U on 12/24.   Added Novolog 4U 12/24, increased to 7U on 12/25, increased to 10U on 12/26  Overall improving  Will monitor with increased activity. 11. Chronic granulomatous disease. Patient on chronic Bactrim lifelong prior to admission and continued 12. Hyperlipidemia. Lipitor 13. GERD. Protonix 14. Hypoalbuminemia  Supplement started 12/20 15. ABLA  Hb 10.9 on 12/20  Cont to monitor 16. Urinary Incontinence  D/ced foley 12/20  PVRs reviewed  Timed voiding, spoke to nursing, increased time to q4 hours  Darifenacin 7.5 started on 12/26 17. Obesity  Body mass index is 35.35 kg/m.  Diet and exercise education  Will cont to encourage weight loss to increase endurance and promote overall health 18. HCAP with acute respiratory failure  Monitor Voriconazole levels 19. Constipation  Bowel reg increased on 12/24  Improving  LOS (Days) 7 A FACE TO FACE EVALUATION WAS PERFORMED  Ankit Karis Jubanil Patel 10/05/2016 8:59 AM

## 2016-10-05 NOTE — Progress Notes (Signed)
Therapy alerted nursing that that patient had c/o pain to right knee, pt stated it felt like it "twisted a little." Upon assessment, R knee appears normal with no redness or pain to touch. Deatra Inaan Angiulli, PA notified at 1345. No new orders at this time, will continue to monitor. Cheryl Wheeler, Phill MutterMelissa Rebecca

## 2016-10-05 NOTE — Progress Notes (Signed)
Occupational Therapy Session Note  Patient Details  Name: Cheryl Wheeler MRN: [id number] Date of Birth: Jun 16, 1966  Today's Date: 10/05/2016 OT Individual Time: [id number] and 1500-1600 OT Individual Time Calculation (min): 70 min and 60 min     Short Term Goals: Week 1:  OT Short Term Goal 1 (Week 1): Pt will complete bathing with Min A and AE PRN OT Short Term Goal 2 (Week 1): Pt will complete LB dressing with Mod A  OT Short Term Goal 3 (Week 1): Pt will stand for 2 minutes while engaged in unilaterally involved task to increase independence with LB self care OT Short Term Goal 4 (Week 1): Pt will complete BSC transfer and LRAD with Min A  Skilled Therapeutic Interventions/Progress Updates:     Session 1:Upon entering the room, pt supine in bed and reports brief is wet. OT discussed importance of alerting staff secondary to concerns for skin breakdown. Pt with no c/o pain this session. Pt rolled L <> R in bed with bed rails and min assistance. Pt able to wash her own buttocks and peri area for hygiene. Pt seated on EOB and applied lotion to body and was able to cross ankle to opposite knee in order to apply lotion and don socks on B LEs. Pt needing close supervision - steady assistance for balance. She was able to remain engaged in 10 minutes of functional task with 1 rest break needed. Pt performed sit <>stand from bed with supervision. Stand pivot transfer with min A and use of RW. Pt seated in wheelchair at sink for grooming tasks and UB self care with set up A. Pt remained in wheelchair with call bell and all needed items within reach.   Session 2: Upon entering the room, pt seated in wheelchair with no c/o pain. Pt propelled wheelchair 75' to day room with supervision and OT managed O2. Pt engaged in B UE strengthening exercises with use of 2.2 lb resistive ball. Pt returned demonstrations with min verbal cues for proper technique. Pt returned to room in same manner. Pt requesting to  return to bed at end of session.  Min A stand pivot transfer into bed. Pt again reporting brief is wet. This OT asks pt every session about toileting in which she declines each time. Pt rolling L <> R in order to doff soiled brief. Pt performed hygiene with set up A to obtain items. Pt remained in bed at end of session with call bell and all needed items within reach.   Therapy Documentation Precautions:  Precautions Precautions: Fall Precaution Comments: Monitor 02 sats Restrictions Weight Bearing Restrictions: No General:   Vital Signs: Therapy Vitals Pulse Rate: 75 BP: (!) 141/66 Oxygen Therapy SpO2: 96 % O2 Device: Nasal Cannula O2 Flow Rate (L/min): 2 L/min Pain: Pain Assessment Pain Assessment: 0-10 Pain Score: 6  Pain Type: Chronic pain Pain Location: Back Pain Orientation: Lower Pain Descriptors / Indicators: Aching Pain Frequency: Intermittent Pain Intervention(s): Medication (See eMAR) ADL:   Exercises:   Other Treatments:   See Function Navigator for Current Functional Status.   Therapy/Group: Individual Therapy  [name], Joylene Wescott P 10/05/2016, 12:31 PM

## 2016-10-05 NOTE — Progress Notes (Signed)
Physical Therapy Note  Patient Details  Name: Adella NissenJonce Ann Marcos MRN: [id number] Date of Birth: 10-17-1965 Today's Date: 10/05/2016    Time: 1300-1341 41 minutes  1:1 No c/o pain.  Pt performed gait 50', 15' with prolonged rest break between with RW and close supervision.  Sit to stand multiple attempts with supervision initially, min A when fatigued.  Stair negotiation to 6'' step initially with bilat handrails with min A x 2 attempts.  Then attempt with 1 handrail with min A x 1 attempt.  On 2nd attempt of 6'' step with 1 handrail pt's Rt knee gave way and pt was eased down by PT to sit on step. Pt c/o feeling "twisting" in her Rt knee but no pain. Pt assisted back to w/c with +2 mod A.  Pt returned to room and ice applied to Rt knee, RN made aware.   Leonor Darnell 10/05/2016, 1:53 PM

## 2016-10-06 ENCOUNTER — Inpatient Hospital Stay (HOSPITAL_COMMUNITY): Payer: Medicare HMO | Admitting: Physical Therapy

## 2016-10-06 ENCOUNTER — Inpatient Hospital Stay (HOSPITAL_COMMUNITY): Payer: Medicare HMO | Admitting: Occupational Therapy

## 2016-10-06 LAB — GLUCOSE, CAPILLARY
GLUCOSE-CAPILLARY: 124 mg/dL — AB (ref 65–99)
GLUCOSE-CAPILLARY: 72 mg/dL (ref 65–99)
GLUCOSE-CAPILLARY: 267 mg/dL — AB (ref 65–99)
Glucose-Capillary: 154 mg/dL — ABNORMAL HIGH (ref 65–99)

## 2016-10-06 NOTE — Progress Notes (Signed)
Physical Therapy Weekly Progress Note  Patient Details  Name: Cheryl Wheeler MRN: [id number] Date of Birth: [birth year]  Beginning of progress report period: September 29, 2016 End of progress report period: October 06, 2016  Today's Date: 10/06/2016  Patient has met 5 of 5 short term goals.  Pt is making progress towards all functional mobility but is still limited by poor endurance, activity tolerance, and decreased cardiopulmonary support. Pt would benefit from continued therapy to address the deficits stated below as well as for family training for ambulation, stairs, and transfers.   Patient continues to demonstrate the following deficits muscle weakness, decreased cardiorespiratoy endurance and decreased oxygen support, and decreased sitting balance, decreased standing balance, decreased postural control and decreased balance strategies and therefore will continue to benefit from skilled PT intervention to increase functional independence with mobility.  Patient progressing toward long term goals..  Continue plan of care.  PT Short Term Goals Week 1:  PT Short Term Goal 1 (Week 1): Pt will perform bed mobility on flat bed with min A PT Short Term Goal 1 - Progress (Week 1): Met (per OT) PT Short Term Goal 2 (Week 1): Pt will perform sit <> stand and stand pivot with RW and min A PT Short Term Goal 2 - Progress (Week 1): Met PT Short Term Goal 3 (Week 1): Pt will perform w/c mobility x 100' with supervision for UE strengthening and endurance PT Short Term Goal 3 - Progress (Week 1): Met PT Short Term Goal 4 (Week 1): Pt will perform gait x 25' with RW and mod A PT Short Term Goal 4 - Progress (Week 1): Met PT Short Term Goal 5 (Week 1): Pt will negotiate 4 short stairs with 2 rails and mod A PT Short Term Goal 5 - Progress (Week 1): Met Week 2:  PT Short Term Goal 1 (Week 2): STG = LTG due to estimated d/c date.    Precautions:  Precautions Precautions: Fall Precaution  Comments: Monitor 02 sats Restrictions Weight Bearing Restrictions: No   Waunita Schooner 10/06/2016, 5:50 PM

## 2016-10-06 NOTE — Progress Notes (Signed)
Physical Therapy Session Note  Patient Details  Name: Cheryl Wheeler MRN: [id number] Date of Birth: 1966/03/05  Today's Date: 10/06/2016 PT Individual Time: 1105-1200 PT Individual Time Calculation (min): 55 min    Short Term Goals: Week 2:     Skilled Therapeutic Interventions/Progress Updates:   Pt in chair agreeable to therapy. C/o increased fatigue this am.  Pt propelled to rehab gym. Gait 53f with RW min guard with PTA pushing O2 tank.  Practiced sit to/from stand from 19",18",17" respectively.  Instructed in rocking motion to facilitate transfer. Pt able to demonstrate and performed 17" sit to/from stand x 5 with short break between each rep for energy conservation.  Performed standing tolerance activities with RW for static balance.  Standing tolerance max 2:05 with pt c/o increased fatigue in legs requesting no further standing. Pt agreeable to NuStep which she was able to perform L1 x5 min at self selected speed with no rest breaks. Pt able to propel self back to room and left in w/c with all needs met.   Therapy Documentation Precautions:  Precautions Precautions: Fall Precaution Comments: Monitor 02 sats Restrictions Weight Bearing Restrictions: No General:   Vital Signs: Oxygen Therapy SpO2: 93 % O2 Device: Not Delivered Pain:   Mobility:   Locomotion :    Trunk/Postural Assessment :    Balance:   Exercises:   Other Treatments:     See Function Navigator for Current Functional Status.   Therapy/Group: Individual Therapy  Jefrey Raburn  Ayianna Darnold, PTA  10/06/2016, 12:31 PM

## 2016-10-06 NOTE — Progress Notes (Signed)
Occupational Therapy Session Note  Patient Details  Name: Cheryl Wheeler MRN: [id number] Date of Birth: 07/12/1966  Today's Date: 10/06/2016 OT Individual Time: 0900-1013 OT Individual Time Calculation (min): 73 min     Short Term Goals: Week 1:  OT Short Term Goal 1 (Week 1): Pt will complete bathing with Min A and AE PRN OT Short Term Goal 2 (Week 1): Pt will complete LB dressing with Mod A  OT Short Term Goal 3 (Week 1): Pt will stand for 2 minutes while engaged in unilaterally involved task to increase independence with LB self care OT Short Term Goal 4 (Week 1): Pt will complete BSC transfer and LRAD with Min A  Skilled Therapeutic Interventions/Progress Updates:    Upon entering the room, pt supine in bed upon entering the room. Pt reporting she felt very fatigued this morning but was willing to attempt OT intervention. Pt also reports, "I'm wet" as therapist entering the room. Pt rolled L <> R with supervision and handed wash cloth in which pt was able to perform hygiene herself. OT assisted pt with donning of brief. Pt performed supine > sit on EOB with supervision. Pants donned while seated EOB with steady assistance for balance.  Pt donning socks as well from EOB by crossing leg over opposite knee. Pt performed stand pivot transfer into wheelchair from bed with steady assistance. Grooming tasks performed at sink with mod I. Pt propelled wheelchair to dayroom with supervision and increased time. Pt engaged in table top activity while therapist educated pt on progress towards goals secondary to pt fatigue. Pt propelled wheelchair back to room and remained in chair with call bell and all needed items within reach upon exiting the room.    Therapy Documentation Precautions:  Precautions Precautions: Fall Precaution Comments: Monitor 02 sats Restrictions Weight Bearing Restrictions: No General:   Vital Signs: Therapy Vitals Temp: 97.7 F (36.5 C) Temp Source: Oral Pulse  Rate: 79 Resp: 17 BP: 127/65 Patient Position (if appropriate): Sitting Oxygen Therapy SpO2: 98 % O2 Device: Nasal Cannula O2 Flow Rate (L/min): 2 L/min Pain: Pain Assessment Pain Assessment: No/denies pain Pain Score: 4  ADL:   Exercises:   Other Treatments:    See Function Navigator for Current Functional Status.   Therapy/Group: Individual Therapy  [name], Nadiah Corbit P 10/06/2016, 4:48 PM

## 2016-10-06 NOTE — Progress Notes (Signed)
Physical Therapy Session Note  Patient Details  Name: Cheryl Wheeler MRN: [id number] Date of Birth: [birth year]  Today's Date: 10/06/2016 PT Individual Time: [id number] PT Individual Time Calculation (min): 71 min    Short Term Goals: Week 1:  PT Short Term Goal 1 (Week 1): Pt will perform bed mobility on flat bed with min A PT Short Term Goal 2 (Week 1): Pt will perform sit <> stand and stand pivot with RW and min A PT Short Term Goal 3 (Week 1): Pt will perform w/c mobility x 100' with supervision for UE strengthening and endurance PT Short Term Goal 4 (Week 1): Pt will perform gait x 25' with RW and mod A PT Short Term Goal 5 (Week 1): Pt will negotiate 4 short stairs with 2 rails and mod A  Skilled Therapeutic Interventions/Progress Updates:    Pt received in w/c reporting she is "tuckered out" today. Pt noted 7/10 pain in low back & legs but reports being premedicated. Session focused on stair negotiation, BLE strengthening & improving overall activity tolerance. Pt voiced anxiety regarding stair negotiation 2/2 loss of balance yesterday; therapist provided encouragement to attempt stairs again & pt agreeable. Transported pt to gym via w/c total assist for time management. Pt negotiated 4 steps with B rails & min assist with pt negotiating with compensatory pattern. Pt reports she does not plan to enter house by laterally negotiating stairs, instead she plans to hold to husband & single rail. Pt & husband would benefit from hands on training of this prior to pt d/c. Pt propelled w/c to day room with BUE & supervision with 1 rest break 2/2 fatigue. Pt tolerated standing 2 minutes 25 seconds + 2 minutes 5 seconds + 2 minutes 10 seconds with single UE support while engaging in game. Educated pt on pursed lip breathing during task. Pt notes her BLE get "shaky" with activity. Pt ambulated 20 ft with RW & supervision for endurance/strength training. At end of session pt requested to lie down; pt  completed stand pivot w/c>bed with steady assist. Pt left in bed with all needs within reach & respiratory therapist present.   Pt on 3L/min supplemental oxygen via nasal cannula during session. SpO2 remained WNL.  Therapy Documentation Precautions:  Precautions Precautions: Fall Precaution Comments: Monitor 02 sats Restrictions Weight Bearing Restrictions: No   See Function Navigator for Current Functional Status.   Therapy/Group: Individual Therapy  Sandi MariscalVictoria M Asiyah Pineau 10/06/2016, 3:45 PM

## 2016-10-06 NOTE — Progress Notes (Signed)
Placed patient on  BIPAP for the night with oxygen set at 3lpm  

## 2016-10-06 NOTE — Progress Notes (Signed)
[facility]     PROGRESS NOTE  Subjective/Complaints:  Pt seen sitting up in bed this AM.  She slept well overnight.  She does not have any complaints this AM.  ROS: Denies CP, SOB, N/V/D.  Objective: Vital Signs: Blood pressure (!) 129/55, pulse 73, temperature 97.6 F (36.4 C), temperature source Oral, resp. rate 18, height 4' 11.5" (1.511 m), weight 77.1 kg (169 lb 14.4 oz), SpO2 93 %. No results found. No results for input(s): WBC, HGB, HCT, PLT in the last 72 hours.  Recent Labs  10/05/16 0458  CREATININE 0.36*   CBG (last 3)   Recent Labs  10/05/16 1625 10/05/16 2109 10/06/16 0634  GLUCAP 155* 136* 124*    Wt Readings from Last 3 Encounters:  10/06/16 77.1 kg (169 lb 14.4 oz)  09/28/16 82.7 kg (182 lb 5.1 oz)  09/17/16 81.2 kg (179 lb 1.6 oz)    Physical Exam:  BP (!) 129/55 (BP Location: Right Arm)   Pulse 73   Temp 97.6 F (36.4 C) (Oral)   Resp 18   Ht 4' 11.5" (1.511 m)   Wt 77.1 kg (169 lb 14.4 oz)   SpO2 93%   BMI 33.74 kg/m  Constitutional: She appears well-developed. NAD. HENT: Normocephalic and atraumatic.  Eyes: EOM are normal. No discharge.  Cardiovascular: RRR. No JVD. Respiratory: Unlabored. Clear. +[place]  GI: Soft. Bowel sounds are normal.  Musculoskeletal: She exhibits no edema or tenderness.  Neurological: She is alert and oriented.  Follows commands Motor: 4+/5 throughout (stable) Skin: Skin is warm and dry.  Psychiatric: She has a normal mood and affect. Her behavior is normal.   Assessment/Plan: 1. Functional deficits secondary to debility which require 3+ hours per day of interdisciplinary therapy in a comprehensive inpatient rehab setting. Physiatrist is providing close team supervision and 24 hour management of active medical problems listed below. Physiatrist and rehab team continue to assess barriers to discharge/monitor patient progress toward functional and medical  goals.  Function:  Bathing Bathing position   Position: Sitting EOB  Bathing parts Body parts bathed by patient: Right arm, Left arm, Chest, Abdomen, Front perineal area, Left upper leg, Right lower leg, Left lower leg, Back, Buttocks, Right upper leg Body parts bathed by helper: Buttocks  Bathing assist Assist Level: Set up, Touching or steadying assistance(Pt > 75%)   Set up : To obtain items  Upper Body Dressing/Undressing Upper body dressing   What is the patient wearing?: Pull over shirt/dress     Pull over shirt/dress - Perfomed by patient: Thread/unthread right sleeve, Thread/unthread left sleeve, Put head through opening, Pull shirt over trunk          Upper body assist Assist Level: Set up   Set up : To obtain clothing/put away  Lower Body Dressing/Undressing Lower body dressing   What is the patient wearing?: Pants, Non-skid slipper socks Underwear - Performed by patient: Thread/unthread right underwear leg, Thread/unthread left underwear leg Underwear - Performed by helper: Pull underwear up/down Pants- Performed by patient: Thread/unthread right pants leg, Thread/unthread left pants leg Pants- Performed by helper: Pull pants up/down Non-skid slipper socks- Performed by patient: Don/doff right sock, Don/doff left sock Non-skid slipper socks- Performed by helper: Don/doff right sock, Don/doff left sock               TED Hose - Performed by helper: Don/doff right TED hose, Don/doff left TED hose  Lower body assist Assist for lower body dressing: Touching  or steadying assistance (Pt > 75%)      Toileting Toileting Toileting activity did not occur: No continent bowel/bladder event Toileting steps completed by patient: Adjust clothing prior to toileting Toileting steps completed by helper: Performs perineal hygiene, Adjust clothing after toileting Toileting Assistive Devices: Grab bar or rail  Toileting assist Assist level:  (Max assist)    Transfers Chair/bed transfer   Chair/bed transfer method: Stand pivot Chair/bed transfer assist level: Touching or steadying assistance (Pt > 75%) Chair/bed transfer assistive device: Bedrails, Armrests, Patent attorneyWalker     Locomotion Ambulation     Max distance: 1610ft  Assist level: Touching or steadying assistance (Pt > 75%)   Wheelchair   Type: Manual Max wheelchair distance: 150 Assist Level: Total assistance (Pt < 25%)  Cognition Comprehension Comprehension assist level: Understands basic 90% of the time/cues < 10% of the time  Expression Expression assist level: Expresses basic 90% of the time/requires cueing < 10% of the time.  Social Interaction Social Interaction assist level: Interacts appropriately with others - No medications needed.  Problem Solving Problem solving assist level: Solves basic problems with no assist  Memory Memory assist level: Recognizes or recalls 75 - 89% of the time/requires cueing 10 - 24% of the time    Medical Problem List and Plan: 1.  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 Continue BiPAP and oxygen therapy.   Taper prednisone as directed with slow wean  Cont CIR 2. DVT Prophylaxis/Anticoagulation: Subcutaneous Lovenox. Monitor platelet counts and any signs of bleeding. Recent vascular studies negative. 3. Pain Management/chronic back pain: MSIR 15 mg twice a day as needed 4. Mood/bipolar disorder: Remeron 45 mg daily at bedtime, valproic acid 500 mg twice a day, Ativan 0.5 mg 3 times a day as needed 5. Neuropsych: This patient is capable of making decisions on her own behalf. 6. Skin/Wound Care: Routine skin checks 7. Fluids/Electrolytes/Nutrition: Routine I&Os 8. Hypertension. Labetalol 300 mg 3 times a day, Norvasc 10 mg daily.   Controlled 12/27  Monitor with increased mobility 9. Acute on chronic diastolic congestive heart failure.   Lasix 40 mg  twice a day, Cr. Stable 12/26  Monitor for any signs of fluid overload 10. Diabetes mellitus with peripheral neuropathy. Hemoglobin A1c 6.5.    Check blood sugars before meals and at bedtime.  Lantus insulin 16 units BID, decreased to 13U on 12/24.   Added Novolog 4U 12/24, increased to 7U on 12/25, increased to 10U on 12/26  Overall continues to improve  Will monitor with increased activity. 11. Chronic granulomatous disease. Patient on chronic Bactrim lifelong prior to admission and continued 12. Hyperlipidemia. Lipitor 13. GERD. Protonix 14. Hypoalbuminemia  Supplement started 12/20 15. ABLA  Hb 10.9 on 12/20  Cont to monitor 16. Urinary Incontinence  D/ced foley 12/20  PVRs reviewed  Timed voiding, spoke to nursing, increased time to q4 hours  Darifenacin 7.5 started on 12/26 17. Obesity  Body mass index is 33.74 kg/m.  Diet and exercise education  Will cont to encourage weight loss to increase endurance and promote overall health 18. HCAP with acute respiratory failure  Monitor Voriconazole levels, still pending 19. Constipation  Bowel reg increased on 12/24  Improving  LOS (Days) 8 A FACE TO FACE EVALUATION WAS PERFORMED  Jatorian Renault Karis Jubanil Aijalon Demuro 10/06/2016 9:15 AM

## 2016-10-06 NOTE — Progress Notes (Signed)
Occupational Therapy Weekly Progress Note  Patient Details  Name: Cheryl Wheeler MRN: [id number] Date of Birth: 23-Jan-1966  Beginning of progress report period: September 29, 2016 End of progress report period: October 06, 2016    Patient has met 4 of 4 short term goals.  Pt is making steady progress towards OT goals this week. Pt performing transfers and overall self care tasks at min A level. Pt continues to need increased rest breaks secondary to fatigue. Pt will continue to benefit from OT intervention.   Patient continues to demonstrate the following deficits: muscle weakness, decreased cardiorespiratoy endurance and decreased oxygen support and decreased standing balance and decreased balance strategies and therefore will continue to benefit from skilled OT intervention to enhance overall performance with BADL.  Patient progressing toward long term goals..  Continue plan of care.  OT Short Term Goals Week 1:  OT Short Term Goal 1 (Week 1): Pt will complete bathing with Min A and AE PRN OT Short Term Goal 1 - Progress (Week 1): Met OT Short Term Goal 2 (Week 1): Pt will complete LB dressing with Mod A  OT Short Term Goal 2 - Progress (Week 1): Met OT Short Term Goal 3 (Week 1): Pt will stand for 2 minutes while engaged in unilaterally involved task to increase independence with LB self care OT Short Term Goal 3 - Progress (Week 1): Met OT Short Term Goal 4 (Week 1): Pt will complete BSC transfer and LRAD with Min A OT Short Term Goal 4 - Progress (Week 1): Met Week 2:  OT Short Term Goal 1 (Week 2): STGs=LTGs secondary to upcoming discharge      Therapy Documentation Precautions: Precautions Precautions: Fall Precaution Comments: Monitor 02 sats Restrictions Weight Bearing Restrictions: No General:   Vital Signs: Oxygen Therapy SpO2: 98 % O2 Device: Nasal Cannula O2 Flow Rate (L/min): 2 L/min Pain: Pain Assessment Pain Assessment: 0-10 Pain Score: 3  Pain  Location: Back Pain Intervention(s): Medication (See eMAR);Emotional support;Environmental changes   Gypsy Decant 10/06/2016, 5:56 PM

## 2016-10-07 ENCOUNTER — Inpatient Hospital Stay (HOSPITAL_COMMUNITY): Payer: Medicare HMO

## 2016-10-07 ENCOUNTER — Inpatient Hospital Stay (HOSPITAL_COMMUNITY): Payer: Medicare HMO | Admitting: Physical Therapy

## 2016-10-07 LAB — GLUCOSE, CAPILLARY
GLUCOSE-CAPILLARY: 112 mg/dL — AB (ref 65–99)
Glucose-Capillary: 87 mg/dL (ref 65–99)
Glucose-Capillary: 81 mg/dL (ref 65–99)
Glucose-Capillary: 154 mg/dL — ABNORMAL HIGH (ref 65–99)

## 2016-10-07 MED ORDER — INSULIN GLARGINE 100 UNIT/ML ~~LOC~~ SOLN
10.0000 [IU] | Freq: Two times a day (BID) | SUBCUTANEOUS | Status: DC
  Administered 2016-10-07 – 2016-10-12 (×11): 10 [IU] via SUBCUTANEOUS
  Filled 2016-10-07 (×12): qty 0.1

## 2016-10-07 NOTE — Progress Notes (Signed)
Physical Therapy Session Note  Patient Details  Name: Cheryl Wheeler MRN: [id number] Date of Birth: 07/21/1966  Today's Date: 10/07/2016 PT Individual Time: 0900-1000 PT Individual Time Calculation (min): 60 min & 52mn    Short Term Goals: Week 2:  PT Short Term Goal 1 (Week 2): STG = LTG due to estimated d/c date.   Skilled Therapeutic Interventions/Progress Updates:   Session 1: Pt in bed agreeable to therapy. Pt with episode of incontinence, pt able to roll L/R for peri cleaning, mod I with use of bed rails. Supine to sit supervision with use of bed rails and HOB elevated. Propelled to rehab gym, gait 440fwith RW x 1 standing rest. No SOB however pt required extended seated rest for recovery. Practiced sit to/from stand from varied heights and surfaces. Pt able to perform sit to stand from 18in, with supervision and 16in with minA. Continued encouragement of rocking for momentum.  Standing tolerance activities after sit to stand trials, 62m65m 1:15 respectively. Pt propelled to room via day room and left in chair with all needs met.    Session 2: Continued efforts of sit to stand trials from couch level (in apt). Pt able to perform with min guard and demonstrating good safety. Performed stair training ascend/decend x 4 steps 2 rails with step to pattern.  Pt initially demonstrating increased anxiety however able to perform with encouragement. Seated therex LAQ, hip abd/add, hip flexion x 10 bilaterally with 1.5lb wts.  Pt requiring extended breaks throughout session 2/2 fatigue. Pt propelled self back to room and returned to bed via squat pivot transfer.  Pt left in bed with alarm within reach and all needs met.   Therapy Documentation Precautions:  Precautions Precautions: Fall Precaution Comments: Monitor 02 sats Restrictions Weight Bearing Restrictions: No   See Function Navigator for Current Functional Status.   Therapy/Group: Individual Therapy  Rosamund Nyland  Laterica Matarazzo, PTA  10/07/2016, 2:55 PM

## 2016-10-07 NOTE — Progress Notes (Signed)
Social Work Patient ID: Cheryl Wheeler, female   DOB: [birth year], 50 y.o.   MRN: [id number]   Have reviewed team conference information with pt and will speak with spouse when here tomorrow.  Pt pleased that targeted d/c date still set for 10/16/16.  She asks about her medical follow up with pulmonary - very concerned that she wants to be seen by [facility].  Have explained to her that the chart indicates she already has follow up appointments set for them.  Will continue to follow.  Cheryl Marinaro, LCSW

## 2016-10-07 NOTE — Progress Notes (Signed)
Occupational Therapy Session Note  Patient Details  Name: Cheryl Wheeler MRN: [id number] Date of Birth: 10/11/1966  Today's Date: 10/07/2016 OT Individual Time: 1100-1200 OT Individual Time Calculation (min): 60 min     Short Term Goals: Week 2:  OT Short Term Goal 1 (Week 2): STGs=LTGs secondary to upcoming discharge  Skilled Therapeutic Interventions/Progress Updates:    Pt resting in w/c upon arrival.  Pt initially engaged in bathing/dressing tasks with sit<>stand from w/c at sink.  Pt required assistance bathing buttocks and donning hospital provided brief.  Pt performed sit<>stand from w/c at supervision level and stood at sink for approx 2 mins to facilitate donning of brief.  Pt requires more than a reasonable amount of time to complete tasks with multiple rest breaks.  Pt transitioned to standing tasks with peg board.  Pt performed sit<>stand x 6 with extended rest breaks.  Pt completed tasks at table requiring use of RUE to complete task.  Pt required LUE for support while standing. Pt propelled back to room and remained in w/c with all needs within reach.  Therapy Documentation Precautions:  Precautions Precautions: Fall Precaution Comments: Monitor 02 sats Restrictions Weight Bearing Restrictions: No  Pain: Pain Assessment Pain Assessment: 0-10 Pain Score: 3  Pain Type: Chronic pain Pain Location: Back Pain Orientation: Lower Pain Descriptors / Indicators: Aching Pain Onset: On-going Patients Stated Pain Goal: 3 Pain Intervention(s): Meds admin prior to therapy See Function Navigator for Current Functional Status.   Therapy/Group: Individual Therapy  Rich BraveLanier, Adriella Essex Chappell 10/07/2016, 12:21 PM

## 2016-10-07 NOTE — Patient Care Conference (Signed)
Inpatient RehabilitationTeam Conference and Plan of Care Update Date: 10/06/2016   Time: 11:40 AM    Patient Name: Cheryl Wheeler      Medical Record Number: [id number]  Date of Birth: 1965/12/02 Sex: Female         Room/Bed: 4W11C/4W11C-01 Payor Info: Payor: [employer] MEDICARE / Plan: Monia Pouch[employer]ETNA MEDICARE HMO/PPO / Product Type: *No Product type* /    Admitting Diagnosis: resp failure  debility   Admit Date/Time:  09/28/2016  5:05 PM Admission Comments: No comment available   Primary Diagnosis:  <principal problem not specified> Principal Problem: <principal problem not specified>  Patient Active Problem List   Diagnosis Date Noted  . Constipation due to pain medication   . Diabetes mellitus type 2 in obese (HCC)   . Labile blood glucose   . Acute on chronic respiratory failure with hypoxia (HCC)   . Debilitated 09/28/2016  . Difficulty in walking, not elsewhere classified   . Chronic pain syndrome   . Gastroesophageal reflux disease without esophagitis   . Hyperlipidemia   . Fungal infection 09/27/2016  . Invasive aspergillosis (HCC)   . Acute respiratory failure with hypoxemia (HCC) 09/17/2016  . Chronic granulomatous disease (HCC)   . HCAP (healthcare-associated pneumonia)   . Benign essential HTN   . Type 2 diabetes mellitus with peripheral neuropathy (HCC)   . Respiratory failure (HCC)   . Opacity of lung on imaging study   . Chronic obstructive pulmonary disease (HCC)   . Bipolar affective disorder (HCC)   . Essential hypertension   . Acute on chronic diastolic heart failure (HCC)   . Urinary incontinence   . Acute blood loss anemia   . Hypoalbuminemia due to protein-calorie malnutrition (HCC)   . 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 COPD exacerbation (HCC) 09/06/2016  . Granulomatous disease (HCC) 09/06/2016  . Diastolic heart failure (HCC) 09/06/2016  . DM (diabetes mellitus) (HCC) 09/06/2016  . HTN (hypertension) 09/06/2016  . Bipolar disorder (HCC) 09/06/2016   . Anxiety 09/06/2016  . Chronic back pain 09/06/2016  . OSA (obstructive sleep apnea) 09/06/2016  . Obesity hypoventilation syndrome (HCC) 09/06/2016  . Acute pulmonary edema (HCC)   . CAP (community acquired pneumonia)   . Dyspnea   . Acute respiratory failure with hypoxia (HCC) 08/26/2016    Expected Discharge Date: Expected Discharge Date: 10/16/16  Team Members Present: Physician leading conference: Dr. Maryla MorrowAnkit Patel Social Worker Present: Amada JupiterLucy Stancil Deisher, LCSW Nurse Present: Chana Bodeeborah Sharp, RN PT Present: Bayard Huggerebecca Varner, PT OT Present: Roney MansJennifer Smith, Domenic SchwabT;Katie Pittman, OT SLP Present: Feliberto Gottronourtney Payne, SLP PPS Coordinator present : Tora DuckMarie Noel, RN, CRRN     Current Status/Progress Goal Weekly Team Focus  Medical   Debilitation with functional deficits secondary to debility after VDRF/COPD exacerbation and CIM and currently maintained on voriconazole.   Improve endurance, DM, HCAP  See above   Bowel/Bladder   Incontinent of bowel and bladder; Preadmission baseline?  LBM 12/28- small; has issues with constipation; timed toileting while awake  Mod assist  Assess and treat for constipation as needed   Swallow/Nutrition/ Hydration             ADL's   min A overall  overall supervision  activity tolerance, self care retraining, transfers, dynamic balance, pt/family education   Mobility   min A transfers and gait short distances with RW  Supervision  activity tolerance, strengthening, stairs   Communication             Safety/Cognition/ Behavioral Observations  Pain   C/o chronic back pain; MSIR BID PRN effective  < 5  Assess and treat for pain q shift and prn   Skin   MASD buttocks  No new skin breakdown or infection with min assist  Assess skin q shift and prn    Rehab Goals Patient on target to meet rehab goals: Yes *See Care Plan and progress notes for long and short-term goals.  Barriers to Discharge: Mobility, endurance, DM, HCAP, urinary incontinence     Possible Resolutions to Barriers:  Cont antifungals, therapies, optimize DM meds, urinary meds    Discharge Planning/Teaching Needs:  Plan for pt to d/c home with spouse able to provide 24/7 assistance  Teaching to be scheduled with spouse prior to d/c.   Team Discussion:  Medically still adjusting DM meds;  Still on anti-fungal for HCAP.  incont b/b and to ensure timed toileting.  Poor endurance but much better than prior CIR.  Min assist with rw.  Revisions to Treatment Plan:  None   Continued Need for Acute Rehabilitation Level of Care: The patient requires daily medical management by a physician with specialized training in physical medicine and rehabilitation for the following conditions: Daily direction of a multidisciplinary physical rehabilitation program to ensure safe treatment while eliciting the highest outcome that is of practical value to the patient.: Yes Daily medical management of patient stability for increased activity during participation in an intensive rehabilitation regime.: Yes Daily analysis of laboratory values and/or radiology reports with any subsequent need for medication adjustment of medical intervention for : Pulmonary problems;Urological problems  Evelyna Folker 10/07/2016, 3:23 PM

## 2016-10-07 NOTE — Progress Notes (Signed)
[facility] PHYS[facility] MEDICINE & REHABILITATION     PROGRESS NOTE  Subjective/Complaints:  Pt seen laying in bed this AM.  She slept well overnight.  She believes she is progressing in therapies.   ROS: Denies CP, SOB, N/V/D.  Objective: Vital Signs: Blood pressure (!) 149/67, pulse 84, temperature 97.5 F (36.4 C), temperature source Oral, resp. rate 16, height 4' 11.5" (1.511 m), weight 77.5 kg (170 lb 13.7 oz), SpO2 95 %. No results found. No results for input(s): WBC, HGB, HCT, PLT in the last 72 hours.  Recent Labs  10/05/16 0458  CREATININE 0.36*   CBG (last 3)   Recent Labs  10/06/16 2102 10/07/16 0636 10/07/16 1204  GLUCAP 154* 87 81    Wt Readings from Last 3 Encounters:  10/07/16 77.5 kg (170 lb 13.7 oz)  09/28/16 82.7 kg (182 lb 5.1 oz)  09/17/16 81.2 kg (179 lb 1.6 oz)    Physical Exam:  BP (!) 149/67 (BP Location: Left Arm)   Pulse 84   Temp 97.5 F (36.4 C) (Oral)   Resp 16   Ht 4' 11.5" (1.511 m)   Wt 77.5 kg (170 lb 13.7 oz)   SpO2 95%   BMI 33.93 kg/m  Constitutional: She appears well-developed. NAD. HENT: Normocephalic and atraumatic.  Eyes: EOM are normal. No discharge.  Cardiovascular: RRR. No JVD. Respiratory: Unlabored. Clear. +Kelly  GI: Soft. Bowel sounds are normal.  Musculoskeletal: She exhibits no edema or tenderness.  Neurological: She is alert and oriented.  Follows commands Motor: 4+/5 throughout (unchanged) Skin: Skin is warm and dry.  Psychiatric: She has a normal mood and affect. Her behavior is normal.   Assessment/Plan: 1. Functional deficits secondary to debility which require 3+ hours per day of interdisciplinary therapy in a comprehensive inpatient rehab setting. Physiatrist is providing close team supervision and 24 hour management of active medical problems listed below. Physiatrist and rehab team continue to assess barriers to discharge/monitor patient progress toward functional and medical  goals.  Function:  Bathing Bathing position   Position: Wheelchair/chair at sink  Bathing parts Body parts bathed by patient: Right arm, Left arm, Chest, Abdomen, Front perineal area, Right upper leg, Left upper leg Body parts bathed by helper: Buttocks  Bathing assist Assist Level: Touching or steadying assistance(Pt > 75%)   Set up : To obtain items  Upper Body Dressing/Undressing Upper body dressing   What is the patient wearing?: Pull over shirt/dress     Pull over shirt/dress - Perfomed by patient: Thread/unthread right sleeve, Thread/unthread left sleeve, Put head through opening, Pull shirt over trunk          Upper body assist Assist Level: Set up   Set up : To obtain clothing/put away  Lower Body Dressing/Undressing Lower body dressing   What is the patient wearing?: Pants, Non-skid slipper socks Underwear - Performed by patient: Thread/unthread right underwear leg, Thread/unthread left underwear leg Underwear - Performed by helper: Pull underwear up/down Pants- Performed by patient: Thread/unthread right pants leg, Thread/unthread left pants leg, Pull pants up/down Pants- Performed by helper: Pull pants up/down Non-skid slipper socks- Performed by patient: Don/doff right sock, Don/doff left sock Non-skid slipper socks- Performed by helper: Don/doff right sock, Don/doff left sock               TED Hose - Performed by helper: Don/doff right TED hose, Don/doff left TED hose  Lower body assist Assist for lower body dressing: Touching or steadying assistance (Pt > 75%)  Toileting Toileting Toileting activity did not occur: No continent bowel/bladder event Toileting steps completed by patient: Adjust clothing prior to toileting Toileting steps completed by helper: Adjust clothing prior to toileting, Performs perineal hygiene, Adjust clothing after toileting Toileting Assistive Devices: Grab bar or rail  Toileting assist Assist level:  (Max assist)    Transfers Chair/bed transfer   Chair/bed transfer method: Stand pivot Chair/bed transfer assist level: Touching or steadying assistance (Pt > 75%) Chair/bed transfer assistive device: Armrests     Locomotion Ambulation     Max distance: 3740ft Assist level: Supervision or verbal cues   Wheelchair   Type: Manual Max wheelchair distance: 150 ft Assist Level: Supervision or verbal cues  Cognition Comprehension Comprehension assist level: Understands complex 90% of the time/cues 10% of the time  Expression Expression assist level: Expresses complex 90% of the time/cues < 10% of the time  Social Interaction Social Interaction assist level: Interacts appropriately with others with medication or extra time (anti-anxiety, antidepressant).  Problem Solving Problem solving assist level: Solves basic problems with no assist  Memory Memory assist level: Recognizes or recalls 75 - 89% of the time/requires cueing 10 - 24% of the time    Medical Problem List and Plan: 1.  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 Continue BiPAP and oxygen therapy.   Taper prednisone as directed with slow wean  Cont CIR 2. DVT Prophylaxis/Anticoagulation: Subcutaneous Lovenox. Monitor platelet counts and any signs of bleeding. Recent vascular studies negative. 3. Pain Management/chronic back pain: MSIR 15 mg twice a day as needed 4. Mood/bipolar disorder: Remeron 45 mg daily at bedtime, valproic acid 500 mg twice a day, Ativan 0.5 mg 3 times a day as needed 5. Neuropsych: This patient is capable of making decisions on her own behalf. 6. Skin/Wound Care: Routine skin checks 7. Fluids/Electrolytes/Nutrition: Routine I&Os 8. Hypertension. Labetalol 300 mg 3 times a day, Norvasc 10 mg daily.   Slightly labile, overall controlled 12/28  Monitor with increased mobility 9. Acute on chronic diastolic congestive heart  failure.   Lasix 40 mg twice a day, Cr. Stable 12/26  Monitor for any signs of fluid overload 10. Diabetes mellitus with peripheral neuropathy. Hemoglobin A1c 6.5.    Check blood sugars before meals and at bedtime.  Lantus insulin 16 units BID, decreased to 13U on 12/24, decreased to 10U on 12/28.   Added Novolog 4U 12/24, increased to 7U on 12/25, increased to 10U on 12/26  Will monitor with increased activity. 11. Chronic granulomatous disease. Patient on chronic Bactrim lifelong prior to admission and continued 12. Hyperlipidemia. Lipitor 13. GERD. Protonix 14. Hypoalbuminemia  Supplement started 12/20 15. ABLA  Hb 10.9 on 12/20  Cont to monitor 16. Urinary Incontinence  D/ced foley 12/20  PVRs reviewed  Timed voiding, spoke to nursing, increased time to q4 hours  Darifenacin 7.5 started on 12/26  ?Improving 17. Obesity  Body mass index is 33.93 kg/m.  Diet and exercise education  Will cont to encourage weight loss to increase endurance and promote overall health 18. HCAP with acute respiratory failure  Monitor Voriconazole levels, still pending 19. Constipation  Bowel reg increased on 12/24  Improving  LOS (Days) 9 A FACE TO FACE EVALUATION WAS PERFORMED  Ankit Karis Jubanil Patel 10/07/2016 12:39 PM

## 2016-10-07 NOTE — Plan of Care (Signed)
Problem: RH BOWEL ELIMINATION Goal: RH STG MANAGE BOWEL W/MEDICATION W/ASSISTANCE STG Manage Bowel with Medication with min Assistance.  Outcome: Not Progressing Timed toileting with incontinent periods

## 2016-10-08 ENCOUNTER — Inpatient Hospital Stay (HOSPITAL_COMMUNITY): Payer: Medicare HMO | Admitting: Physical Therapy

## 2016-10-08 ENCOUNTER — Inpatient Hospital Stay (HOSPITAL_COMMUNITY): Payer: Medicare HMO | Admitting: Occupational Therapy

## 2016-10-08 DIAGNOSIS — R0989 Other specified symptoms and signs involving the circulatory and respiratory systems: Secondary | ICD-10-CM

## 2016-10-08 LAB — GLUCOSE, CAPILLARY
GLUCOSE-CAPILLARY: 162 mg/dL — AB (ref 65–99)
GLUCOSE-CAPILLARY: 229 mg/dL — AB (ref 65–99)
GLUCOSE-CAPILLARY: 97 mg/dL (ref 65–99)
Glucose-Capillary: 95 mg/dL (ref 65–99)

## 2016-10-08 MED ORDER — PREDNISONE 5 MG PO TABS
15.0000 mg | ORAL_TABLET | Freq: Two times a day (BID) | ORAL | Status: DC
  Administered 2016-10-08 – 2016-10-12 (×9): 15 mg via ORAL
  Filled 2016-10-08 (×9): qty 3

## 2016-10-08 NOTE — Progress Notes (Signed)
Occupational Therapy Session Note  Patient Details  Name: Cheryl Wheeler MRN: [id number] Date of Birth: 1966/09/06  Today's Date: 10/08/2016 OT Individual Time: 5284-1324 and 1256-1355 OT Individual Time Calculation (min): 58 min and 59 min  Short Term Goals: Week 1:  OT Short Term Goal 1 (Week 1): Pt will complete bathing with Min A and AE PRN OT Short Term Goal 1 - Progress (Week 1): Met OT Short Term Goal 2 (Week 1): Pt will complete LB dressing with Mod A  OT Short Term Goal 2 - Progress (Week 1): Met OT Short Term Goal 3 (Week 1): Pt will stand for 2 minutes while engaged in unilaterally involved task to increase independence with LB self care OT Short Term Goal 3 - Progress (Week 1): Met OT Short Term Goal 4 (Week 1): Pt will complete BSC transfer and LRAD with Min A OT Short Term Goal 4 - Progress (Week 1): Met Week 2:  OT Short Term Goal 1 (Week 2): STGs=LTGs secondary to upcoming discharge  Skilled Therapeutic Interventions/Progress Updates:    Pt was lying in bed with husband Cheryl Wheeler present at time of arrival, agreeable to BADLs due to soiled brief. 02 sats 97% at rest. Pt completed supine<sit with close supervision and ambulated to w/c that was set up at sink with RW and steady assist. Bathing completed with overall supervision standing as needed at sink and using LH sponge for back. Pt able to manage 02 without cues. Dressing completed with overall supervision with pt able to utilize ankle over knee strategies for applying lotion and donning socks. Hair washing was then completed with Total A. Pt completed hair brushing/oral care with extra time. 02 sats monitored throughout tx and remained between 95-97% on 3L. At end of session pt was left in w/c with all needs within reach and husband present.   2nd Session 1:1 tx (59 min) Pt participated in skilled OT session focusing on 02 mgt during self care tasks and utilization of energy conservation techniques in daily routine. Pt was  sitting in w/c at time of arrival, agreeable to tx. Pt self propelled to therapy apartment with extra time and supervision. Portable 02 set up in middle of apartment as makeshift 02 concentrator with extra extension tubing. Pt completed simple meal prep with RW with cues for adaptive technique for cord mgt. Pt also educated on energy conservation techniques and kitchen modifications to increase ease of access. Pt issued with walker bag for transporting kitchen items. Afterwards pt ambulated into bathroom with steady assist and completed toilet transfer (per set up at home). Pt then ambulated back to w/c and self propelled back to room utilizing good safety with 02 mgt. Pt self initiated multiple rest breaks throughout session for energy conservation, 02 sats 94-96% on 3L today. Pt was left in w/c with all needs within reach at time of departure.    Therapy Documentation Precautions:  Precautions Precautions: Fall Precaution Comments: Monitor 02 sats Restrictions Weight Bearing Restrictions: No  Pain: No c/o pain during session  Pain Assessment Pain Assessment: 0-10 Pain Score: 3  Pain Type: Chronic pain Pain Location: Back Pain Orientation: Lower Pain Descriptors / Indicators: Aching Pain Frequency: Intermittent Pain Onset: On-going Pain Intervention(s): Medication (See eMAR) ADL:  :    See Function Navigator for Current Functional Status.   Therapy/Group: Individual Therapy  Egan Berkheimer A Uri Covey 10/08/2016, 12:45 PM

## 2016-10-08 NOTE — Progress Notes (Signed)
[name] PHYSICAL MEDICINE & REHABILITATION     PROGRESS NOTE  Subjective/Complaints:  Pt seen laying in bed this AM.  She slept well overnight and is in good spirits. She inquires about discharge date.   ROS: Denies CP, SOB, N/V/D.  Objective: Vital Signs: Blood pressure (!) 158/71, pulse 77, temperature 98.5 F (36.9 C), temperature source Oral, resp. rate 16, height 4' 11.5" (1.511 m), weight 76.2 kg (167 lb 15.9 oz), SpO2 97 %. No results found. No results for input(s): WBC, HGB, HCT, PLT in the last 72 hours. No results for input(s): NA, K, CL, GLUCOSE, BUN, CREATININE, CALCIUM in the last 72 hours.  Invalid input(s): CO CBG (last 3)   Recent Labs  10/07/16 2126 10/08/16 0643 10/08/16 1131  GLUCAP 112* 95 162*    Wt Readings from Last 3 Encounters:  10/08/16 76.2 kg (167 lb 15.9 oz)  09/28/16 82.7 kg (182 lb 5.1 oz)  09/17/16 81.2 kg (179 lb 1.6 oz)    Physical Exam:  BP (!) 158/71 (BP Location: Left Arm)   Pulse 77   Temp 98.5 F (36.9 C) (Oral)   Resp 16   Ht 4' 11.5" (1.511 m)   Wt 76.2 kg (167 lb 15.9 oz)   SpO2 97%   BMI 33.36 kg/m  Constitutional: She appears well-developed. NAD. HENT: Normocephalic and atraumatic.  Eyes: EOM are normal. No discharge.  Cardiovascular: RRR. No JVD. Respiratory: Unlabored. Clear. +Cheryl Wheeler  GI: Soft. Bowel sounds are normal.  Musculoskeletal: She exhibits no edema or tenderness.  Neurological: She is alert and oriented.  Follows commands Motor: 4+/5 throughout (stable) Skin: Skin is warm and dry.  Psychiatric: She has a normal mood and affect. Her behavior is normal.   Assessment/Plan: 1. Functional deficits secondary to debility which require 3+ hours per day of interdisciplinary therapy in a comprehensive inpatient rehab setting. Physiatrist is providing close team supervision and 24 hour management of active medical problems listed below. Physiatrist and rehab team continue to assess barriers to discharge/monitor  patient progress toward functional and medical goals.  Function:  Bathing Bathing position   Position: Wheelchair/chair at sink  Bathing parts Body parts bathed by patient: Right arm, Left arm, Chest, Abdomen, Front perineal area, Right upper leg, Left upper leg Body parts bathed by helper: Buttocks  Bathing assist Assist Level: Touching or steadying assistance(Pt > 75%)   Set up : To obtain items  Upper Body Dressing/Undressing Upper body dressing   What is the patient wearing?: Pull over shirt/dress     Pull over shirt/dress - Perfomed by patient: Thread/unthread right sleeve, Thread/unthread left sleeve, Put head through opening, Pull shirt over trunk          Upper body assist Assist Level: Set up   Set up : To obtain clothing/put away  Lower Body Dressing/Undressing Lower body dressing   What is the patient wearing?: Pants, Non-skid slipper socks Underwear - Performed by patient: Thread/unthread right underwear leg, Thread/unthread left underwear leg Underwear - Performed by helper: Pull underwear up/down Pants- Performed by patient: Thread/unthread right pants leg, Thread/unthread left pants leg, Pull pants up/down Pants- Performed by helper: Pull pants up/down Non-skid slipper socks- Performed by patient: Don/doff right sock, Don/doff left sock Non-skid slipper socks- Performed by helper: Don/doff right sock, Don/doff left sock               TED Hose - Performed by helper: Don/doff right TED hose, Don/doff left TED hose  Lower body assist Assist for  lower body dressing: Touching or steadying assistance (Pt > 75%)      Toileting Toileting Toileting activity did not occur: No continent bowel/bladder event Toileting steps completed by patient: Adjust clothing prior to toileting Toileting steps completed by helper: Adjust clothing prior to toileting, Performs perineal hygiene, Adjust clothing after toileting Toileting Assistive Devices: Grab bar or rail  Toileting  assist Assist level: Touching or steadying assistance (Pt.75%)   Transfers Chair/bed transfer   Chair/bed transfer method: Stand pivot Chair/bed transfer assist level: Touching or steadying assistance (Pt > 75%) Chair/bed transfer assistive device: Armrests     Locomotion Ambulation     Max distance: 2940ft Assist level: Supervision or verbal cues   Wheelchair   Type: Manual Max wheelchair distance: 150 ft Assist Level: Supervision or verbal cues  Cognition Comprehension Comprehension assist level: Understands complex 90% of the time/cues 10% of the time  Expression Expression assist level: Expresses complex 90% of the time/cues < 10% of the time  Social Interaction Social Interaction assist level: Interacts appropriately with others with medication or extra time (anti-anxiety, antidepressant).  Problem Solving Problem solving assist level: Solves basic problems with no assist  Memory Memory assist level: Recognizes or recalls 75 - 89% of the time/requires cueing 10 - 24% of the time    Medical Problem List and Plan: 1.  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 Continue BiPAP and oxygen therapy.   Tapering prednisone with slow wean  Cont CIR 2. DVT Prophylaxis/Anticoagulation: Subcutaneous Lovenox. Monitor platelet counts and any signs of bleeding. Recent vascular studies negative. 3. Pain Management/chronic back pain: MSIR 15 mg twice a day as needed 4. Mood/bipolar disorder: Remeron 45 mg daily at bedtime, valproic acid 500 mg twice a day, Ativan 0.5 mg 3 times a day as needed 5. Neuropsych: This patient is capable of making decisions on her own behalf. 6. Skin/Wound Care: Routine skin checks 7. Fluids/Electrolytes/Nutrition: Routine I&Os 8. Hypertension. Labetalol 300 mg 3 times a day, Norvasc 10 mg daily.   Slightly labile over last 48 hours, otherwise controlled 12/29  Monitor  with increased mobility 9. Acute on chronic diastolic congestive heart failure.   Lasix 40 mg twice a day, Cr. Stable 12/26  Labs ordered for Monday  Monitor for any signs of fluid overload 10. Diabetes mellitus with peripheral neuropathy. Hemoglobin A1c 6.5.    Check blood sugars before meals and at bedtime.  Lantus insulin 16 units BID, decreased to 13U on 12/24, decreased to 10U on 12/28.   Added Novolog 4U 12/24, increased to 7U on 12/25, increased to 10U on 12/26  Overall controlled 12/29. 11. Chronic granulomatous disease. Patient on chronic Bactrim lifelong prior to admission and continued 12. Hyperlipidemia. Lipitor 13. GERD. Protonix 14. Hypoalbuminemia  Supplement started 12/20 15. ABLA  Hb 10.9 on 12/20  Labs ordered for Monday  Cont to monitor 16. Urinary Incontinence  D/ced foley 12/20  PVRs reviewed  Timed voiding, spoke to nursing, increased time to q4 hours  Darifenacin 7.5 started on 12/26  ?Improving 17. Obesity  Body mass index is 33.36 kg/m.  Diet and exercise education  Will cont to encourage weight loss to increase endurance and promote overall health 18. HCAP with acute respiratory failure  Monitor Voriconazole levels, still pending 19. Constipation  Bowel reg increased on 12/24  Improving  LOS (Days) 10 A FACE TO FACE EVALUATION WAS PERFORMED  Wassim Kirksey Karis Jubanil Sunil Hue 10/08/2016 1:14 PM

## 2016-10-08 NOTE — Progress Notes (Signed)
Physical Therapy Session Note  Patient Details  Name: Cheryl Wheeler MRN: [id number] Date of Birth: [birth year]  Today's Date: 10/08/2016 PT Individual Time: 1620-1700 PT Individual Time Calculation (min): 40 min    Short Term Goals:Week 2:  PT Short Term Goal 1 (Week 2): STG = LTG due to estimated d/c date.   Skilled Therapeutic Interventions/Progress Updates:   Pt received sitting in WC and agreeable to PT. NT present for glucose assessment.   WC mobility to and from rehab gym with supervision assist from PT with min cues for doorway management and improved posture. Cues for improved pursed lip breathing throughout WC mobility. Patient SpO2 remained>90 throughout WC mobility.   Gait training for 14f +214fwith supervision assist from PT and management of supplemental O2. Cues for improved breathing through gait as well as decreased WB through UE as tolerated.   Patient returned to room and left in bed following transfer to supine with supervision assist from PT; min cues for positioning in bed and decreased use of rail as tolerated. Call bell in reach and all needs met.        Therapy Documentation Precautions:  Precautions Precautions: Fall Precaution Comments: Monitor 02 sats Restrictions Weight Bearing Restrictions: No General:   Vital Signs: Therapy Vitals Temp: 99.9 F (37.7 C) Temp Source: Oral Pulse Rate: 88 Resp: 18 BP: (!) 109/54 Patient Position (if appropriate): Sitting Oxygen Therapy SpO2: 96 % O2 Device: Nasal Cannula O2 Flow Rate (L/min): 3 L/min Pain: 0/10   See Function Navigator for Current Functional Status.   Therapy/Group: Individual Therapy  AuLorie Phenix2/29/2017, 4:45 PM

## 2016-10-09 ENCOUNTER — Inpatient Hospital Stay (HOSPITAL_COMMUNITY): Payer: Medicare HMO

## 2016-10-09 LAB — GLUCOSE, CAPILLARY
GLUCOSE-CAPILLARY: 78 mg/dL (ref 65–99)
GLUCOSE-CAPILLARY: 112 mg/dL — AB (ref 65–99)
Glucose-Capillary: 195 mg/dL — ABNORMAL HIGH (ref 65–99)
Glucose-Capillary: 135 mg/dL — ABNORMAL HIGH (ref 65–99)

## 2016-10-09 NOTE — Progress Notes (Signed)
[place] PHYSICAL MEDICINE & REHABILITATION     PROGRESS NOTE  Subjective/Complaints:  Complained of constipation when I rounded this morning. Had large BM after rounds.   ROS: pt denies nausea, vomiting, diarrhea, cough, shortness of breath or chest pain   Objective: Vital Signs: Blood pressure (!) 123/57, pulse 82, temperature 98 F (36.7 C), temperature source Oral, resp. rate 18, height 4' 11.5" (1.511 m), weight 76.8 kg (169 lb 4.8 oz), SpO2 95 %. No results found. No results for input(s): WBC, HGB, HCT, PLT in the last 72 hours. No results for input(s): NA, K, CL, GLUCOSE, BUN, CREATININE, CALCIUM in the last 72 hours.  Invalid input(s): CO CBG (last 3)   Recent Labs  10/08/16 1625 10/08/16 2049 10/09/16 0644  GLUCAP 229* 97 78    Wt Readings from Last 3 Encounters:  10/09/16 76.8 kg (169 lb 4.8 oz)  09/28/16 82.7 kg (182 lb 5.1 oz)  09/17/16 81.2 kg (179 lb 1.6 oz)    Physical Exam:  BP (!) 123/57   Pulse 82   Temp 98 F (36.7 C) (Oral)   Resp 18   Ht 4' 11.5" (1.511 m)   Wt 76.8 kg (169 lb 4.8 oz)   SpO2 95%   BMI 33.62 kg/m  Constitutional: She appears well-developed. NAD. HENT: Normocephalic and atraumatic.  Eyes: EOM are normal. No discharge.  Cardiovascular: RRR. Respiratory: clear, nasal cannula in place GI: Soft. Bowel sounds are normal.  Musculoskeletal: She exhibits no edema or tenderness.  Neurological: She is alert and oriented.  Follows commands Motor: 4+/5 throughout all 4 limbs Skin: Skin is warm and dry.  Psychiatric: She has a normal mood and affect. Her behavior is normal.   Assessment/Plan: 1. Functional deficits secondary to debility which require 3+ hours per day of interdisciplinary therapy in a comprehensive inpatient rehab setting. Physiatrist is providing close team supervision and 24 hour management of active medical problems listed below. Physiatrist and rehab team continue to assess barriers to discharge/monitor patient  progress toward functional and medical goals.  Function:  Bathing Bathing position   Position: Wheelchair/chair at sink  Bathing parts Body parts bathed by patient: Right arm, Left arm, Chest, Abdomen, Front perineal area, Right upper leg, Left upper leg, Buttocks, Right lower leg, Left lower leg, Back Body parts bathed by helper: Buttocks  Bathing assist Assist Level: Supervision or verbal cues   Set up : To obtain items  Upper Body Dressing/Undressing Upper body dressing   What is the patient wearing?: Pull over shirt/dress     Pull over shirt/dress - Perfomed by patient: Thread/unthread right sleeve, Thread/unthread left sleeve, Put head through opening, Pull shirt over trunk          Upper body assist Assist Level: Set up   Set up : To obtain clothing/put away  Lower Body Dressing/Undressing Lower body dressing   What is the patient wearing?: Pants, Non-skid slipper socks, Ted Hose, Underwear Underwear - Performed by patient: Thread/unthread right underwear leg, Thread/unthread left underwear leg Underwear - Performed by helper: Pull underwear up/down Pants- Performed by patient: Thread/unthread right pants leg, Thread/unthread left pants leg, Pull pants up/down Pants- Performed by helper: Pull pants up/down Non-skid slipper socks- Performed by patient: Don/doff right sock, Don/doff left sock Non-skid slipper socks- Performed by helper: Don/doff right sock, Don/doff left sock               TED Hose - Performed by helper: Don/doff right TED hose, Don/doff left TED hose  Lower body assist Assist for lower body dressing: Supervision or verbal cues      Toileting Toileting Toileting activity did not occur: No continent bowel/bladder event Toileting steps completed by patient: Adjust clothing prior to toileting Toileting steps completed by helper: Adjust clothing prior to toileting, Performs perineal hygiene, Adjust clothing after toileting Toileting Assistive Devices:  Grab bar or rail  Toileting assist Assist level: Touching or steadying assistance (Pt.75%)   Transfers Chair/bed transfer   Chair/bed transfer method: Stand pivot Chair/bed transfer assist level: Touching or steadying assistance (Pt > 75%) Chair/bed transfer assistive device: Armrests, Patent attorneyWalker     Locomotion Ambulation     Max distance: 7635ft  Assist level: Supervision or verbal cues   Wheelchair   Type: Manual Max wheelchair distance: 11250ft  Assist Level: Supervision or verbal cues  Cognition Comprehension Comprehension assist level: Understands complex 90% of the time/cues 10% of the time  Expression Expression assist level: Expresses complex 90% of the time/cues < 10% of the time  Social Interaction Social Interaction assist level: Interacts appropriately with others with medication or extra time (anti-anxiety, antidepressant).  Problem Solving Problem solving assist level: Solves basic problems with no assist  Memory Memory assist level: Recognizes or recalls 75 - 89% of the time/requires cueing 10 - 24% of the time    Medical Problem List and Plan: 1.  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 Continue BiPAP and oxygen therapy.   Tapering prednisone with slow wean  Cont CIR therapies 2. DVT Prophylaxis/Anticoagulation: Subcutaneous Lovenox. Monitor platelet counts and any signs of bleeding. Recent vascular studies negative. 3. Pain Management/chronic back pain: MSIR 15 mg twice a day as needed 4. Mood/bipolar disorder: Remeron 45 mg daily at bedtime, valproic acid 500 mg twice a day, Ativan 0.5 mg 3 times a day as needed 5. Neuropsych: This patient is capable of making decisions on her own behalf. 6. Skin/Wound Care: Routine skin checks 7. Fluids/Electrolytes/Nutrition: Routine I&Os 8. Hypertension. Labetalol 300 mg 3 times a day, Norvasc 10 mg daily.   Improving  control  Monitor with increased mobility 9. Acute on chronic diastolic congestive heart failure.   Lasix 40 mg twice a day, Cr. Stable 12/26  Labs ordered for Monday  Monitor for any signs of fluid overload 10. Diabetes mellitus with peripheral neuropathy. Hemoglobin A1c 6.5.    Check blood sugars before meals and at bedtime.  Lantus insulin 16 units BID, decreased to 13U on 12/24, decreased to 10U on 12/28.   Added Novolog 4U 12/24, increased to 7U on 12/25, increased to 10U on 12/26  Improving control. 11. Chronic granulomatous disease. Patient on chronic Bactrim lifelong prior to admission and continued 12. Hyperlipidemia. Lipitor 13. GERD. Protonix 14. Hypoalbuminemia  Supplement started 12/20 15. ABLA  Hb 10.9 on 12/20  Labs ordered for Monday  Cont to monitor 16. Urinary Incontinence  D/ced foley 12/20  PVRs reviewed  Timed voiding, spoke to nursing, increased time to q4 hours  Darifenacin 7.5 started on 12/26  ?Improving 17. Obesity  Body mass index is 33.62 kg/m.  Diet and exercise education  Will cont to encourage weight loss to increase endurance and promote overall health 18. HCAP with acute respiratory failure  Monitor Voriconazole levels, 12/21 lab therapeutic 19. Constipation  Bowel reg increased on 12/24  Had LARGE BM this morning after I saw her on rounds---no further changes made today  LOS (Days) 11 A FACE TO FACE EVALUATION  WAS PERFORMED  Cheryl Wheeler 10/09/2016 9:28 AM

## 2016-10-09 NOTE — Progress Notes (Signed)
Occupational Therapy Session Note  Patient Details  Name: Cheryl Wheeler MRN: [id number] Date of Birth: [birth year]  Today's Date: 10/09/2016 OT Individual Time:1005-1100  Short Term Goals: Week 2:  OT Short Term Goal 1 (Week 2): STGs=LTGs secondary to upcoming discharge  Skilled Therapeutic Interventions/Progress Updates:   ADL-retraining seated at sink with focus on improved activity tolerance, static and dynamic standing balance, and energy conservation.   Pt received supine in bed awaiting therapist with plan to continue bathing at sink.   Pt required only set-up and standby assist to sit at EOB and ambulate to w/c placed at sink.   Pt advised on her use of hospital bed HOB elevation and rails but reports that she does not have hospital bed at home (OT recommends hospital bed d/t observed pt weakness and chronic COPD).   Pt proceeds through BADL unassisted, resting as needed and requesting assist only to change brief and wash her buttocks (plus apply cream).   Pt is able to sustain supported standing balance for approximately 2:40 min during task and requires rest break after assist with brief.   Pt rejects use of reacher for lower body dressing as she is able to lace pants and don socks d/t good flexibility at hips/knees.   Pt defers washing her hair and requests check of 02 sats (94% on 3 L flow rate).   Pt left in w/c at end of session with all needs within reach.  Therapy Documentation Precautions:  Precautions Precautions: Fall Precaution Comments: Monitor 02 sats Restrictions Weight Bearing Restrictions: No  Vital Signs: Therapy Vitals Temp: 98 F (36.7 C) Temp Source: Oral Pulse Rate: 82 Resp: 18 BP: 120/66 Patient Position (if appropriate): Lying Oxygen Therapy SpO2: 98 % O2 Device: Nasal Cannula O2 Flow Rate (L/min): 3 L/min   Pain: No/denies pain  See Function Navigator for Current Functional Status.   Therapy/Group: Individual Therapy   Second session: Time:   1330-1445 Time Calculation (min):  75 min  Pain Assessment: 5/10 back and legs, fatigued   Skilled Therapeutic Interventions: Therapeutic activity with focus on improved activity tolerance, dynamic sitting balance, LE strengthening, transfers, and w/c management.    Pt received long sitting in bed after finishing her lunch.   With setup to place w/c pt completes bed mobility and transfer with extra time, vc to manage DME (bed, w/c, and 02 lines), and standby assist for safety.   Pt requests setup is escorted to rehab gym and completes stand-pivot transfer to raised mat to participate in seated activity, playing 1 game of Wii bowling.   Pt requires rest break prior to bowling; 02 sats were 94%, and enjoys game seated.    Pt then transfers back to w/c to transfer to NuStep.    Pt performs BLE strengthening using NuStep for 5:18 min, level 1, approx 168 steps completed (sustaining average rate, 32 steps/min). Pt returns to her w/c and to bed, requesting change of brief again at end of session while supine in bed.   See FIM for current functional status  Therapy/Group: Individual Therapy  Anyla Israelson 10/09/2016, 7:30 AM

## 2016-10-09 NOTE — Plan of Care (Signed)
Problem: RH BLADDER ELIMINATION Goal: RH STG MANAGE BLADDER WITH EQUIPMENT WITH ASSISTANCE STG Manage Bladder With Equipment With max Assistance  Outcome: Progressing Does not have foley, but checking PVRs

## 2016-10-10 ENCOUNTER — Inpatient Hospital Stay (HOSPITAL_COMMUNITY): Payer: Medicare HMO | Admitting: Occupational Therapy

## 2016-10-10 ENCOUNTER — Inpatient Hospital Stay (HOSPITAL_COMMUNITY): Payer: Medicare HMO | Admitting: *Deleted

## 2016-10-10 LAB — GLUCOSE, CAPILLARY
GLUCOSE-CAPILLARY: 72 mg/dL (ref 65–99)
GLUCOSE-CAPILLARY: 126 mg/dL — AB (ref 65–99)
GLUCOSE-CAPILLARY: 85 mg/dL (ref 65–99)
Glucose-Capillary: 110 mg/dL — ABNORMAL HIGH (ref 65–99)

## 2016-10-10 MED ORDER — SENNOSIDES-DOCUSATE SODIUM 8.6-50 MG PO TABS
2.0000 | ORAL_TABLET | Freq: Every day | ORAL | Status: DC
  Administered 2016-10-10 – 2016-10-15 (×6): 2 via ORAL
  Filled 2016-10-10 (×6): qty 2

## 2016-10-10 NOTE — Progress Notes (Signed)
PHYSICAL MEDICINE & REHABILITATION     PROGRESS NOTE  Subjective/Complaints:  No new complaints. Had BM yesterday. Feels fairly well today.   ROS: pt denies nausea, vomiting, diarrhea, cough, shortness of breath or chest pain   Objective: Vital Signs: Blood pressure (!) 146/65, pulse 76, temperature 98 F (36.7 C), temperature source Oral, resp. rate 17, height 4' 11.5" (1.511 m), weight 79.3 kg (174 lb 14.4 oz), SpO2 96 %. No results found. No results for input(s): WBC, HGB, HCT, PLT in the last 72 hours. No results for input(s): NA, K, CL, GLUCOSE, BUN, CREATININE, CALCIUM in the last 72 hours.  Invalid input(s): CO CBG (last 3)   Recent Labs  10/09/16 1631 10/09/16 2120 10/10/16 0651  GLUCAP 195* 135* 72    Wt Readings from Last 3 Encounters:  10/10/16 79.3 kg (174 lb 14.4 oz)  09/28/16 82.7 kg (182 lb 5.1 oz)  09/17/16 81.2 kg (179 lb 1.6 oz)    Physical Exam:  BP (!) 146/65 (BP Location: Left Arm)   Pulse 76   Temp 98 F (36.7 C) (Oral)   Resp 17   Ht 4' 11.5" (1.511 m)   Wt 79.3 kg (174 lb 14.4 oz)   SpO2 96%   BMI 34.73 kg/m  Constitutional: She appears well-developed. NAD. HENT: Normocephalic and atraumatic.  Eyes: EOM are normal. No discharge.  Cardiovascular: RRR. Respiratory: CTA B, [place] GI: Soft. Bowel sounds are normal.  Musculoskeletal: She exhibits no edema or tenderness.  Neurological: She is alert and oriented.  Follows commands Motor: 4+/5 throughout all 4 limbs Skin: Skin is warm and dry.  Psychiatric: She has a normal mood and affect. Her behavior is normal.   Assessment/Plan: 1. Functional deficits secondary to debility which require 3+ hours per day of interdisciplinary therapy in a comprehensive inpatient rehab setting. Physiatrist is providing close team supervision and 24 hour management of active medical problems listed below. Physiatrist and rehab team continue to assess barriers to discharge/monitor patient progress  toward functional and medical goals.  Function:  Bathing Bathing position   Position: Wheelchair/chair at sink  Bathing parts Body parts bathed by patient: Right arm, Left arm, Chest, Abdomen, Front perineal area, Right upper leg, Left upper leg, Right lower leg, Left lower leg Body parts bathed by helper: Back, Buttocks  Bathing assist Assist Level: Touching or steadying assistance(Pt > 75%)   Set up : To obtain items  Upper Body Dressing/Undressing Upper body dressing   What is the patient wearing?: Button up shirt     Pull over shirt/dress - Perfomed by patient: Thread/unthread right sleeve, Thread/unthread left sleeve, Put head through opening, Pull shirt over trunk          Upper body assist Assist Level: Set up   Set up : To obtain clothing/put away  Lower Body Dressing/Undressing Lower body dressing   What is the patient wearing?: Underwear, Pants, Ted Hose, Non-skid slipper socks Underwear - Performed by patient: Thread/unthread right underwear leg, Thread/unthread left underwear leg Underwear - Performed by helper: Thread/unthread right underwear leg, Thread/unthread left underwear leg, Pull underwear up/down (briefs) Pants- Performed by patient: Thread/unthread right pants leg, Thread/unthread left pants leg, Pull pants up/down Pants- Performed by helper: Pull pants up/down Non-skid slipper socks- Performed by patient: Don/doff right sock, Don/doff left sock Non-skid slipper socks- Performed by helper: Don/doff right sock, Don/doff left sock               TED Hose - Performed by  helper: Don/doff right TED hose, Don/doff left TED hose  Lower body assist Assist for lower body dressing: Touching or steadying assistance (Pt > 75%)      Toileting Toileting Toileting activity did not occur: No continent bowel/bladder event Toileting steps completed by patient: Adjust clothing prior to toileting Toileting steps completed by helper: Adjust clothing prior to  toileting, Performs perineal hygiene, Adjust clothing after toileting Toileting Assistive Devices: Grab bar or rail  Toileting assist Assist level: Touching or steadying assistance (Pt.75%)   Transfers Chair/bed transfer   Chair/bed transfer method: Stand pivot Chair/bed transfer assist level: Touching or steadying assistance (Pt > 75%) Chair/bed transfer assistive device: Armrests, Patent attorneyWalker     Locomotion Ambulation     Max distance: 6235ft  Assist level: Supervision or verbal cues   Wheelchair   Type: Manual Max wheelchair distance: 18550ft  Assist Level: Supervision or verbal cues  Cognition Comprehension Comprehension assist level: Understands complex 90% of the time/cues 10% of the time  Expression Expression assist level: Expresses complex 90% of the time/cues < 10% of the time  Social Interaction Social Interaction assist level: Interacts appropriately with others with medication or extra time (anti-anxiety, antidepressant).  Problem Solving Problem solving assist level: Solves basic problems with no assist  Memory Memory assist level: Recognizes or recalls 75 - 89% of the time/requires cueing 10 - 24% of the time    Medical Problem List and Plan: 1.  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 Continue BiPAP and oxygen therapy.   Tapering prednisone with slow wean  Cont CIR therapies 2. DVT Prophylaxis/Anticoagulation: Subcutaneous Lovenox. Monitor platelet counts and any signs of bleeding. Recent vascular studies negative. 3. Pain Management/chronic back pain: MSIR 15 mg twice a day as needed 4. Mood/bipolar disorder: Remeron 45 mg daily at bedtime, valproic acid 500 mg twice a day, Ativan 0.5 mg 3 times a day as needed 5. Neuropsych: This patient is capable of making decisions on her own behalf. 6. Skin/Wound Care: Routine skin checks 7. Fluids/Electrolytes/Nutrition: Routine  I&Os 8. Hypertension. Labetalol 300 mg 3 times a day, Norvasc 10 mg daily.   Improving control  Monitor with increased mobility 9. Acute on chronic diastolic congestive heart failure.   Lasix 40 mg twice a day, Cr. Stable 12/26  Labs ordered for Monday  Monitor for any signs of fluid overload 10. Diabetes mellitus with peripheral neuropathy. Hemoglobin A1c 6.5.    Check blood sugars before meals and at bedtime.  Lantus insulin 16 units BID, decreased to 13U on 12/24, decreased to 10U on 12/28.   Added Novolog 4U 12/24, increased to 7U on 12/25, increased to 10U on 12/26  Improved control overall. Consider reducing PM lantus and increasing AM lantus moving forward 11. Chronic granulomatous disease. Patient on chronic Bactrim lifelong prior to admission and continued 12. Hyperlipidemia. Lipitor 13. GERD. Protonix 14. Hypoalbuminemia  Supplement started 12/20 15. ABLA  Hb 10.9 on 12/20  Labs ordered for Monday  Cont to monitor 16. Urinary Incontinence  D/ced foley 12/20  PVRs reviewed  Timed voiding, spoke to nursing, increased time to q4 hours  Darifenacin 7.5 started on 12/26  ?Improving 17. Obesity  Body mass index is 34.73 kg/m.  Diet and exercise education  Will cont to encourage weight loss to increase endurance and promote overall health 18. HCAP with acute respiratory failure  Monitor Voriconazole levels, 12/21 lab therapeutic 19. Constipation  Increased senna-s to 2 tabs QHS  -  moved bowels yesterday and again this morning   LOS (Days) 12 A FACE TO FACE EVALUATION WAS PERFORMED  Cheryl Wheeler T 10/10/2016 8:45 AM

## 2016-10-10 NOTE — Progress Notes (Signed)
Occupational Therapy Session Note  Patient Details  Name: Cheryl Wheeler MRN: [id number] Date of Birth: 08/31/1966  Today's Date: 10/10/2016 OT Individual Time: 1191-47821515-1625 OT Individual Time Calculation (min): 70 min     Short Term Goals: Week 2:  OT Short Term Goal 1 (Week 2): STGs=LTGs secondary to upcoming discharge  Skilled Therapeutic Interventions/Progress Updates:   Patient seen for dynamic standing tolerance and energy conservation and work simplification.   As well, she was educated on use of toilet aide (reach extender) and difficulty releasing the paper in a similated trial.    Also patient assisted to bed to rest at end of session with w/c to bed transfer=close S and bed mobility=max A  Therapy Documentation Precautions:  Precautions Precautions: Fall Precaution Comments: Monitor 02 sats Restrictions Weight Bearing Restrictions: No  Pain:denied    See Function Navigator for Current Functional Status.   Therapy/Group: Individual Therapy  Bud Faceickett, Cheryl Wheeler 10/10/2016, 6:02 PM

## 2016-10-10 NOTE — Progress Notes (Signed)
Physical Therapy Session Note  Patient Details  Name: Cheryl NissenJonce Ann Fellenz MRN: [id number] Date of Birth: Dec 07, 1965  Today's Date: 10/10/2016 PT Individual Time: 1002-1035 PT Individual Time Calculation (min): 33 min    Short Term Goals: Week 2:  PT Short Term Goal 1 (Week 2): STG = LTG due to estimated d/c date.  Week 3:     Skilled Therapeutic Interventions/Progress Updates:    Tx focused on gait, WC mobility, and [place] HEP therex for strengthening and activity tolerance. [facility], assisted by NT for changing briefs. Pt able to stand x712min with close S and RW, cues for nose breathing, as though she's "smelling." Pt's VSS throughout tx on 3L.   Pt propelled WC 2x150' with distant S for strengthening, cues for stoke efficiency.   Gait training with RW 2x45' with close S and cues for breathing, distance limited by fatigue. Pt encouraged to continue to make small gains each day, rather than continuing to do what she has always done.   Pt instructed in HEP program via [place] program. Pt limited by fatigue, so rest breaks needed between each task and only completed 3 of the ex: LAQ x10; standing HIP ABD x5 each; heel/toe raises x5 bil.   [facility] needs in reach.   Therapy Documentation Precautions:  Precautions Precautions: Fall Precaution Comments: Monitor 02 sats Restrictions Weight Bearing Restrictions: No General:   Vital Signs: Therapy Vitals Pulse Rate: 84 BP: 130/71 Pain: none   See Function Navigator for Current Functional Status.   Therapy/Group: Individual Therapy  Clydene Lamingole Noga Fogg, PT, DPT   10/10/2016, 11:01 AM

## 2016-10-10 NOTE — Progress Notes (Addendum)
Occupational Therapy Session Note  Patient Details  Name: Cheryl Wheeler MRN: [id number] Date of Birth: 20-Jan-1966  Today's Date: 10/10/2016 OT Individual Time: 6759-1638 OT Individual Time Calculation (min): 60 min    Short Term Goals: Week 1:  OT Short Term Goal 1 (Week 1): Pt will complete bathing with Min A and AE PRN OT Short Term Goal 1 - Progress (Week 1): Met OT Short Term Goal 2 (Week 1): Pt will complete LB dressing with Mod A  OT Short Term Goal 2 - Progress (Week 1): Met OT Short Term Goal 3 (Week 1): Pt will stand for 2 minutes while engaged in unilaterally involved task to increase independence with LB self care OT Short Term Goal 3 - Progress (Week 1): Met OT Short Term Goal 4 (Week 1): Pt will complete BSC transfer and LRAD with Min A OT Short Term Goal 4 - Progress (Week 1): Met Week 2:  OT Short Term Goal 1 (Week 2): STGs=LTGs secondary to upcoming discharge  Skilled Therapeutic Interventions/Progress Updates:   Pt was lying in bed at time of arrival, just finishing her coffee. Pt was agreeable to get dressed due to soiled brief but declined bathing. Supine>sit completed with HOB flat with supervision without use of bedrails to simulate home. She ambulated short distance to w/c set up at sink with supervision and RW. Hygiene completed with pt standing at sink with  pt able to assist with 1/2 of task. Pt stood for 3 minutes without rest during task. Dressing completed with overall Min A for lifting pants over hips due to pt having "shakes" early in morning. Min cues for 02 mgt during LB dressing. Pt then completed grooming/oral care with setup. Afterwards pt self propelled to dayroom. She participated in meaningful coloring activity while seated and standing (for 3 minutes) on supported surface to increase standing tolerance and endurance. Discussed DME needs, pts plans for incontinence mgt at home, and sequence of grad day/discharge this upcoming week. At end of tx pt self  propelled in manner as written above and was left with all needs within reach at time of departure. 02 sats >95% on 3L during session.    Therapy Documentation Precautions:  Precautions Precautions: Fall Precaution Comments: Monitor 02 sats Restrictions Weight Bearing Restrictions: No General:   Vital Signs: Therapy Vitals Pulse Rate: 84 BP: 130/71 Pain: No c/o pain during session    ADL:    See Function Navigator for Current Functional Status.   Therapy/Group: Individual Therapy  Manika Hast A Shakia Sebastiano 10/10/2016, 12:32 PM

## 2016-10-11 LAB — GLUCOSE, CAPILLARY
Glucose-Capillary: 113 mg/dL — ABNORMAL HIGH (ref 65–99)
Glucose-Capillary: 139 mg/dL — ABNORMAL HIGH (ref 65–99)
Glucose-Capillary: 190 mg/dL — ABNORMAL HIGH (ref 65–99)
Glucose-Capillary: 189 mg/dL — ABNORMAL HIGH (ref 65–99)

## 2016-10-11 LAB — BASIC METABOLIC PANEL
ANION GAP: 12 (ref 5–15)
BUN: 16 mg/dL (ref 6–20)
CO2: 31 mmol/L (ref 22–32)
Calcium: 9.1 mg/dL (ref 8.9–10.3)
Chloride: 91 mmol/L — ABNORMAL LOW (ref 101–111)
Creatinine, Ser: 0.72 mg/dL (ref 0.44–1.00)
GFR calc Af Amer: >60 mL/min (ref >60)
GFR calc non Af Amer: >60 mL/min (ref >60)
GLUCOSE: 159 mg/dL — AB (ref 65–99)
POTASSIUM: 4.2 mmol/L (ref 3.5–5.1)
Sodium: 134 mmol/L — ABNORMAL LOW (ref 135–145)

## 2016-10-11 LAB — CBC WITH DIFFERENTIAL/PLATELET
Basophils Absolute: 0 10*3/uL (ref 0.0–0.1)
Basophils Relative: 0 %
Eosinophils Absolute: 0 10*3/uL (ref 0.0–0.7)
Eosinophils Relative: 0 %
HEMATOCRIT: 38.1 % (ref 36.0–46.0)
Hemoglobin: 11.8 g/dL — ABNORMAL LOW (ref 12.0–15.0)
LYMPHS PCT: 11 %
Lymphs Abs: 0.8 10*3/uL (ref 0.7–4.0)
MCH: 27.3 pg (ref 26.0–34.0)
MCHC: 31 g/dL (ref 30.0–36.0)
MCV: 88.2 fL (ref 78.0–100.0)
MONO ABS: 0.2 10*3/uL (ref 0.1–1.0)
MONOS PCT: 3 %
NEUTROS ABS: 6.3 10*3/uL (ref 1.7–7.7)
Neutrophils Relative %: 86 %
Platelets: 156 10*3/uL (ref 150–400)
RBC: 4.32 MIL/uL (ref 3.87–5.11)
RDW: 18.9 % — AB (ref 11.5–15.5)
WBC: 7.3 10*3/uL (ref 4.0–10.5)

## 2016-10-11 NOTE — Progress Notes (Signed)
[place] PHYSICAL MEDICINE & REHABILITATION     PROGRESS NOTE  Subjective/Complaints:  Pt laying in bed this AM.  She slept well overnight and had a good weekend.  She is looking forward to rest today.   ROS: Denies CP, SOB, N/V/D.  Objective: Vital Signs: Blood pressure 135/61, pulse 84, temperature 98.5 F (36.9 C), temperature source Oral, resp. rate 18, height 4' 11.5" (1.511 m), weight 79.3 kg (174 lb 14.4 oz), SpO2 99 %. No results found. No results for input(s): WBC, HGB, HCT, PLT in the last 72 hours. No results for input(s): NA, K, CL, GLUCOSE, BUN, CREATININE, CALCIUM in the last 72 hours.  Invalid input(s): CO CBG (last 3)   Recent Labs  10/10/16 1618 10/10/16 2041 10/11/16 0706  GLUCAP 126* 85 113*    Wt Readings from Last 3 Encounters:  10/10/16 79.3 kg (174 lb 14.4 oz)  09/28/16 82.7 kg (182 lb 5.1 oz)  09/17/16 81.2 kg (179 lb 1.6 oz)    Physical Exam:  BP 135/61 (BP Location: Right Arm)   Pulse 84   Temp 98.5 F (36.9 C) (Oral)   Resp 18   Ht 4' 11.5" (1.511 m)   Wt 79.3 kg (174 lb 14.4 oz)   SpO2 99%   BMI 34.73 kg/m  Constitutional: She appears well-developed. NAD. HENT: Normocephalic and atraumatic.  Eyes: EOM are normal. No discharge.  Cardiovascular: RRR. No JVD. Respiratory: +[place]. CTA B, Unlabored GI: Soft. Bowel sounds are normal.  Musculoskeletal: She exhibits no edema or tenderness.  Neurological: She is alert and oriented.  Motor: 4+/5 grossly throughout Skin: Skin is warm and dry.  Psychiatric: She has a normal mood and affect. Her behavior is normal.   Assessment/Plan: 1. Functional deficits secondary to debility which require 3+ hours per day of interdisciplinary therapy in a comprehensive inpatient rehab setting. Physiatrist is providing close team supervision and 24 hour management of active medical problems listed below. Physiatrist and rehab team continue to assess barriers to discharge/monitor patient progress toward  functional and medical goals.  Function:  Bathing Bathing position   Position: Wheelchair/chair at sink  Bathing parts Body parts bathed by patient: Right arm, Left arm, Chest, Abdomen, Front perineal area, Right upper leg, Left upper leg, Right lower leg, Left lower leg Body parts bathed by helper: Back, Buttocks  Bathing assist Assist Level: Touching or steadying assistance(Pt > 75%)   Set up : To obtain items  Upper Body Dressing/Undressing Upper body dressing   What is the patient wearing?: Pull over shirt/dress     Pull over shirt/dress - Perfomed by patient: Thread/unthread right sleeve, Thread/unthread left sleeve, Put head through opening, Pull shirt over trunk          Upper body assist Assist Level: Set up   Set up : To obtain clothing/put away  Lower Body Dressing/Undressing Lower body dressing   What is the patient wearing?: Underwear, Pants, Non-skid slipper socks, Ted Hose Underwear - Performed by patient: Thread/unthread right underwear leg, Thread/unthread left underwear leg Underwear - Performed by helper: Pull underwear up/down Pants- Performed by patient: Thread/unthread right pants leg, Thread/unthread left pants leg Pants- Performed by helper: Pull pants up/down Non-skid slipper socks- Performed by patient: Don/doff right sock, Don/doff left sock Non-skid slipper socks- Performed by helper: Don/doff right sock, Don/doff left sock               TED Hose - Performed by helper: Don/doff right TED hose, Don/doff left TED hose  Lower body assist Assist for lower body dressing: Touching or steadying assistance (Pt > 75%)      Toileting Toileting Toileting activity did not occur: No continent bowel/bladder event Toileting steps completed by patient: Adjust clothing prior to toileting Toileting steps completed by helper: Adjust clothing prior to toileting, Performs perineal hygiene, Adjust clothing after toileting Toileting Assistive Devices: Grab bar or  rail  Toileting assist Assist level: Touching or steadying assistance (Pt.75%)   Transfers Chair/bed transfer   Chair/bed transfer method: Ambulatory Chair/bed transfer assist level: Supervision or verbal cues Chair/bed transfer assistive device: Armrests, Patent attorney     Max distance: 45 Assist level: Supervision or verbal cues   Wheelchair   Type: Manual Max wheelchair distance: 161ft  Assist Level: Supervision or verbal cues  Cognition Comprehension Comprehension assist level: Understands complex 90% of the time/cues 10% of the time  Expression Expression assist level: Expresses complex 90% of the time/cues < 10% of the time  Social Interaction Social Interaction assist level: Interacts appropriately with others with medication or extra time (anti-anxiety, antidepressant).  Problem Solving Problem solving assist level: Solves basic problems with no assist  Memory Memory assist level: Recognizes or recalls 75 - 89% of the time/requires cueing 10 - 24% of the time    Medical Problem List and Plan: 1.  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 Continue BiPAP and oxygen therapy.   Tapering prednisone with slow wean  Cont CIR therapies 2. DVT Prophylaxis/Anticoagulation: Subcutaneous Lovenox. Monitor platelet counts and any signs of bleeding. Recent vascular studies negative. 3. Pain Management/chronic back pain: MSIR 15 mg twice a day as needed 4. Mood/bipolar disorder: Remeron 45 mg daily at bedtime, valproic acid 500 mg twice a day, Ativan 0.5 mg 3 times a day as needed 5. Neuropsych: This patient is capable of making decisions on her own behalf. 6. Skin/Wound Care: Routine skin checks 7. Fluids/Electrolytes/Nutrition: Routine I&Os 8. Hypertension. Labetalol 300 mg 3 times a day, Norvasc 10 mg daily.   Overall controlled 1/1  Monitor with increased  mobility 9. Acute on chronic diastolic congestive heart failure.   Lasix 40 mg twice a day, Cr. Stable 12/26  Labs pending  Monitor for any signs of fluid overload 10. Diabetes mellitus with peripheral neuropathy. Hemoglobin A1c 6.5.    Check blood sugars before meals and at bedtime.  Lantus insulin 16 units BID, decreased to 13U on 12/24, decreased to 10U on 12/28.   Added Novolog 4U 12/24, increased to 7U on 12/25, increased to 10U on 12/26  Overall controlled 1/1 11. Chronic granulomatous disease. Patient on chronic Bactrim lifelong prior to admission and continued 12. Hyperlipidemia. Lipitor 13. GERD. Protonix 14. Hypoalbuminemia  Supplement started 12/20 15. ABLA  Hb 10.9 on 12/20  Labs pending  Cont to monitor 16. Urinary Incontinence  D/ced foley 12/20  PVRs reviewed  Timed voiding, spoke to nursing, increased time to q4 hours  Darifenacin 7.5 started on 12/26  Cont to monitor 17. Obesity  Body mass index is 34.73 kg/m.  Diet and exercise education  Will cont to encourage weight loss to increase endurance and promote overall health 18. HCAP with acute respiratory failure  Monitor Voriconazole levels, 12/21 lab therapeutic  Will consult with Pulm for meds at d/c 19. Constipation  Increased senna-s to 2 tabs QHS  moved bowels yesterday and again this morning   LOS (Days) 13 A FACE TO FACE  EVALUATION WAS PERFORMED  Ankit Karis Jubanil Patel 10/11/2016 8:11 AM

## 2016-10-11 NOTE — Plan of Care (Signed)
Problem: RH SKIN INTEGRITY Goal: RH STG SKIN FREE OF INFECTION/BREAKDOWN Skin to remain free from infection and breakdown while on rehab with min assist  Outcome: Not Progressing MASD, healing, managed with Butt cream  Problem: RH PAIN MANAGEMENT Goal: RH STG PAIN MANAGED AT OR BELOW PT'S PAIN GOAL <4 on a 0-10 pain scale  Outcome: Progressing Pain at 6 at times and reduced to 3 after medication

## 2016-10-12 ENCOUNTER — Inpatient Hospital Stay (HOSPITAL_COMMUNITY): Payer: Medicare HMO | Admitting: Physical Therapy

## 2016-10-12 ENCOUNTER — Inpatient Hospital Stay (HOSPITAL_COMMUNITY): Payer: Medicare HMO

## 2016-10-12 ENCOUNTER — Inpatient Hospital Stay (HOSPITAL_COMMUNITY): Payer: Medicare HMO | Admitting: Occupational Therapy

## 2016-10-12 DIAGNOSIS — E871 Hypo-osmolality and hyponatremia: Secondary | ICD-10-CM

## 2016-10-12 DIAGNOSIS — R3915 Urgency of urination: Secondary | ICD-10-CM

## 2016-10-12 LAB — CREATININE, SERUM
CREATININE: 0.5 mg/dL (ref 0.44–1.00)
GFR calc Af Amer: >60 mL/min (ref >60)
GFR calc non Af Amer: >60 mL/min (ref >60)

## 2016-10-12 LAB — GLUCOSE, CAPILLARY
GLUCOSE-CAPILLARY: 109 mg/dL — AB (ref 65–99)
GLUCOSE-CAPILLARY: 201 mg/dL — AB (ref 65–99)
GLUCOSE-CAPILLARY: 193 mg/dL — AB (ref 65–99)
Glucose-Capillary: 190 mg/dL — ABNORMAL HIGH (ref 65–99)

## 2016-10-12 NOTE — Progress Notes (Addendum)
Physical Therapy Session Note  Patient Details  Name: Cheryl Wheeler MRN: [id number] Date of Birth: 08-12-1966  Today's Date: 10/12/2016 PT Individual Time: [id number] PT Individual Time Calculation (min): 26 min    Short Term Goals: Week 2:  PT Short Term Goal 1 (Week 2): STG = LTG due to estimated d/c date.   Skilled Therapeutic Interventions/Progress Updates:    Pt received in w/c & agreeable to tx, noting 5/10 back & leg pain but reports it is not time for her next dose of pain meds. Transported pt to gym via w/c total assist for time management. Pt ambulated 45 ft + 45 ft with RW & supervision assist for endurance training. Pt requires significantly long seated rest break between ambulation trials 2/2 fatigue. Pt agreeable to sit up in w/c for dinner. Pt left in room in w/c with all needs within reach.   Pt on 3L/min supplemental oxygen via nasal cannula throughout session & SpO2 remained 98%.  Therapy Documentation Precautions:  Precautions Precautions: Fall Precaution Comments: Monitor 02 sats Restrictions Weight Bearing Restrictions: No   See Function Navigator for Current Functional Status.   Therapy/Group: Individual Therapy  Sandi MariscalVictoria M Dalessandro Baldyga 10/12/2016, 5:17 PM

## 2016-10-12 NOTE — Progress Notes (Signed)
Physical Therapy Session Note  Patient Details  Name: Cheryl NissenJonce Ann Lloyd MRN: [id number] Date of Birth: 11/23/1965  Today's Date: 10/12/2016 PT Individual Time: 1300-1353 PT Individual Time Calculation (min): 53 min    Short Term Goals: Week 2:  PT Short Term Goal 1 (Week 2): STG = LTG due to estimated d/c date.   Skilled Therapeutic Interventions/Progress Updates:    Patient in wheelchair upon arrival on 3 L 02 via [place] with 02 sats WFL throughout session. Patient declined propelling wheelchair due to pain. Performed transfers using RW with supervision. Performed NuStep using BUE/BLE at level 5, 2 x 4 min with rest break between. Gait using RW x 10 ft + 15 ft with supervision/assist for 02 tank management. Instructed in OTAGO HEP for falls prevention and BLE strengthening using RW for BUE support: LAQ x 10 each LE, standing hip abduction x 10 each LE, standing knee flexion x 10 each LE, standing heel raises x 10, standing alt hip flexion x 10 each LE. Patient required multiple rest break throughout session due to fatigue and SOB. Patient left sitting in wheelchair with all needs within reach.   Therapy Documentation Precautions:  Precautions Precautions: Fall Precaution Comments: Monitor 02 sats Restrictions Weight Bearing Restrictions: No Pain: Pain Assessment Pain Assessment: 0-10 Pain Score: 6  Pain Type: Chronic pain Pain Location: Leg Pain Orientation: Right;Left Pain Descriptors / Indicators: Aching;Constant;Discomfort Pain Onset: On-going Pain Intervention(s): Repositioned;Rest   See Function Navigator for Current Functional Status.   Therapy/Group: Individual Therapy  Kysen Wetherington, Prudencio PairRebecca A 10/12/2016, 2:00 PM

## 2016-10-12 NOTE — Progress Notes (Signed)
Physical Therapy Session Note  Patient Details  Name: Cheryl Wheeler MRN: [id number] Date of Birth: [birth year]  Today's Date: 10/12/2016 PT Individual Time: 1115-1200 PT Individual Time Calculation (min): 45 min    Short Term Goals: Week 2:  PT Short Term Goal 1 (Week 2): STG = LTG due to estimated d/c date.   Skilled Therapeutic Interventions/Progress Updates:    Session focused on d/c planning, overall endurance/activity tolerance, functional transfers with RW, simulated car transfer to SUV height (completed at 27" height), stair negotiation for home entry simulation, and w/c mobility for general strengthening and endurance. Pt completed transfers and gait at supervision level with RW; required min assist for stair negotiation (4 steps with 6" height). Pt reports she plans to hold onto her husband on R side with L rail for support (practiced this with 1 step with min assist) - stairs are too narrow in the gym to go all the way up. Education during rest breaks on ways to incorporate increased activity in home life including her hobbies. Pt reports her husband may be in tomorrow for family education - recommend to focus on stairs and SUV transfer.   3L O2 via [place] during session and sats WNL.  Therapy Documentation Precautions:  Precautions Precautions: Fall Precaution Comments: Monitor 02 sats Restrictions Weight Bearing Restrictions: No  Pain: Pain Assessment Pain Assessment: 0-10 Pain Score: 6  Pain Type: Chronic pain Pain Location: Back Pain Orientation: Lower Pain Descriptors / Indicators: Aching;Constant;Discomfort Pain Frequency: Constant Pain Onset: On-going Patients Stated Pain Goal: 3 Pain Intervention(s): Medication (See eMAR)   See Function Navigator for Current Functional Status.   Therapy/Group: Individual Therapy  Karolee StampsGray, Cheryl Wheeler  Cheryl Wheeler, PT, DPT  10/12/2016, 12:07 PM

## 2016-10-12 NOTE — Progress Notes (Signed)
Occupational Therapy Session Note  Patient Details  Name: Cheryl Wheeler MRN: [id number] Date of Birth: Jun 20, 1966  Today's Date: 10/12/2016 OT Individual Time: [id number] OT Individual Time Calculation (min): 58 min     Short Term Goals: Week 2:  OT Short Term Goal 1 (Week 2): STGs=LTGs secondary to upcoming discharge  Skilled Therapeutic Interventions/Progress Updates:     Upon entering the room, pt supine in bed with reports of fatigue but agreeable to OT intervention. No pain reported. Pt performed bed mobility with supervision and transferred from bed >wheelchair with supervision and use of RW for stand pivot. Pt engaged in bathing and dressing with sit <>stand from wheelchair with supervision -set up A to obtain materials. Steady assistance needed for balance with LB dressing. Pt with several questions regarding discharge and answered to pt's expectations. Pt transitioned easily to PT session as therapist exited the room.   Therapy Documentation Precautions:  Precautions Precautions: Fall Precaution Comments: Monitor 02 sats Restrictions Weight Bearing Restrictions: No General:   Vital Signs: Oxygen Therapy SpO2: 100 % O2 Device: Nasal Cannula O2 Flow Rate (L/min): 3 L/min Pain: Pain Assessment Pain Assessment: 0-10 Pain Score: 6  Pain Type: Chronic pain Pain Location: Back Pain Orientation: Lower Pain Descriptors / Indicators: Aching;Constant;Discomfort Pain Frequency: Constant Pain Onset: On-going Patients Stated Pain Goal: 3 Pain Intervention(s): Medication (See eMAR)  See Function Navigator for Current Functional Status.   Therapy/Group: Individual Therapy  [name], Jaymes Hang P 10/12/2016, 12:13 PM

## 2016-10-12 NOTE — Plan of Care (Signed)
Problem: RH Ambulation Goal: LTG Patient will ambulate in controlled environment (PT) LTG: Patient will ambulate in a controlled environment, # of feet with assistance (PT).  Downgraded distance due to endurance. ABG  Problem: RH Stairs Goal: LTG Patient will ambulate up and down stairs w/assist (PT) LTG: Patient will ambulate up and down # of stairs with assistance (PT)  Downgraded to min assist due to only has 1 rail for home entry. ABG

## 2016-10-12 NOTE — Progress Notes (Signed)
[place] PHYSICAL MEDICINE & REHABILITATION     PROGRESS NOTE  Subjective/Complaints:  Pt sitting up in bed this AM.  She slept well.  She complains of urinary urgency.    ROS: +Urinary urgency. Denies CP, SOB, N/V/D.  Objective: Vital Signs: Blood pressure (!) 122/58, pulse 79, temperature 98 F (36.7 C), temperature source Oral, resp. rate 18, height 4' 11.5" (1.511 m), weight 75.8 kg (167 lb 1.7 oz), SpO2 94 %. No results found.  Recent Labs  10/11/16 1354  WBC 7.3  HGB 11.8*  HCT 38.1  PLT 156    Recent Labs  10/11/16 1354  NA 134*  K 4.2  CL 91*  GLUCOSE 159*  BUN 16  CREATININE 0.72  CALCIUM 9.1   CBG (last 3)   Recent Labs  10/11/16 1627 10/11/16 2120 10/12/16 0626  GLUCAP 190* 189* 109*    Wt Readings from Last 3 Encounters:  10/12/16 75.8 kg (167 lb 1.7 oz)  09/28/16 82.7 kg (182 lb 5.1 oz)  09/17/16 81.2 kg (179 lb 1.6 oz)    Physical Exam:  BP (!) 122/58 (BP Location: Right Arm)   Pulse 79   Temp 98 F (36.7 C) (Oral)   Resp 18   Ht 4' 11.5" (1.511 m)   Wt 75.8 kg (167 lb 1.7 oz)   SpO2 94%   BMI 33.19 kg/m  Constitutional: She appears well-developed. NAD. HENT: Normocephalic and atraumatic.  Eyes: EOM are normal. No discharge.  Cardiovascular: RRR. No JVD. Respiratory: +[place]. Clear, Unlabored GI: Soft. Bowel sounds are normal.  Musculoskeletal: She exhibits no edema or tenderness.  Neurological: She is alert and oriented.  Motor: 4+/5 grossly throughout (unchanged) Skin: Skin is warm and dry.  Psychiatric: She has a normal mood and affect. Her behavior is normal.   Assessment/Plan: 1. Functional deficits secondary to debility which require 3+ hours per day of interdisciplinary therapy in a comprehensive inpatient rehab setting. Physiatrist is providing close team supervision and 24 hour management of active medical problems listed below. Physiatrist and rehab team continue to assess barriers to discharge/monitor patient progress  toward functional and medical goals.  Function:  Bathing Bathing position   Position: Wheelchair/chair at sink  Bathing parts Body parts bathed by patient: Right arm, Left arm, Chest, Abdomen, Front perineal area, Right upper leg, Left upper leg, Right lower leg, Left lower leg Body parts bathed by helper: Back, Buttocks  Bathing assist Assist Level: Touching or steadying assistance(Pt > 75%)   Set up : To obtain items  Upper Body Dressing/Undressing Upper body dressing   What is the patient wearing?: Pull over shirt/dress     Pull over shirt/dress - Perfomed by patient: Thread/unthread right sleeve, Thread/unthread left sleeve, Put head through opening, Pull shirt over trunk          Upper body assist Assist Level: Set up   Set up : To obtain clothing/put away  Lower Body Dressing/Undressing Lower body dressing   What is the patient wearing?: Underwear, Pants, Non-skid slipper socks, Ted Hose Underwear - Performed by patient: Thread/unthread right underwear leg, Thread/unthread left underwear leg Underwear - Performed by helper: Pull underwear up/down Pants- Performed by patient: Thread/unthread right pants leg, Thread/unthread left pants leg Pants- Performed by helper: Pull pants up/down Non-skid slipper socks- Performed by patient: Don/doff right sock, Don/doff left sock Non-skid slipper socks- Performed by helper: Don/doff right sock, Don/doff left sock               TED  Hose - Performed by helper: Don/doff right TED hose, Don/doff left TED hose  Lower body assist Assist for lower body dressing: Touching or steadying assistance (Pt > 75%)      Toileting Toileting Toileting activity did not occur: No continent bowel/bladder event Toileting steps completed by patient: Adjust clothing prior to toileting Toileting steps completed by helper: Adjust clothing prior to toileting, Performs perineal hygiene, Adjust clothing after toileting Toileting Assistive Devices: Grab  bar or rail  Toileting assist Assist level: Touching or steadying assistance (Pt.75%)   Transfers Chair/bed transfer   Chair/bed transfer method: Ambulatory Chair/bed transfer assist level: Supervision or verbal cues Chair/bed transfer assistive device: Armrests, Patent attorneyWalker     Locomotion Ambulation     Max distance: 45 Assist level: Supervision or verbal cues   Wheelchair   Type: Manual Max wheelchair distance: 18350ft  Assist Level: Supervision or verbal cues  Cognition Comprehension Comprehension assist level: Understands complex 90% of the time/cues 10% of the time  Expression Expression assist level: Expresses complex 90% of the time/cues < 10% of the time  Social Interaction Social Interaction assist level: Interacts appropriately with others with medication or extra time (anti-anxiety, antidepressant).  Problem Solving Problem solving assist level: Solves basic problems with no assist  Memory Memory assist level: Recognizes or recalls 75 - 89% of the time/requires cueing 10 - 24% of the time    Medical Problem List and Plan: 1.  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 Continue BiPAP and oxygen therapy.   Tapering prednisone with slow wean  Cont CIR therapies 2. DVT Prophylaxis/Anticoagulation: Subcutaneous Lovenox. Monitor platelet counts and any signs of bleeding. Recent vascular studies negative. 3. Pain Management/chronic back pain: MSIR 15 mg twice a day as needed 4. Mood/bipolar disorder: Remeron 45 mg daily at bedtime, valproic acid 500 mg twice a day, Ativan 0.5 mg 3 times a day as needed 5. Neuropsych: This patient is capable of making decisions on her own behalf. 6. Skin/Wound Care: Routine skin checks 7. Fluids/Electrolytes/Nutrition: Routine I&Os 8. Hypertension. Labetalol 300 mg 3 times a day, Norvasc 10 mg daily.   Controlled 1/2  Monitor with increased mobility 9.  Acute on chronic diastolic congestive heart failure.   Lasix 40 mg twice a day, Cr. Stable 1/1  Monitor for any signs of fluid overload 10. Diabetes mellitus with peripheral neuropathy. Hemoglobin A1c 6.5.    Check blood sugars before meals and at bedtime.  Lantus insulin 16 units BID, decreased to 13U on 12/24, decreased to 10U on 12/28.   Added Novolog 4U 12/24, increased to 7U on 12/25, increased to 10U on 12/26  Slightly elevated in last 24 hours 11. Chronic granulomatous disease. Patient on chronic Bactrim lifelong prior to admission and continued 12. Hyperlipidemia. Lipitor 13. GERD. Protonix 14. Hypoalbuminemia  Supplement started 12/20 15. ABLA  Hb 11.8 on 1/1  Cont to monitor 16. Urinary Incontinence due to urgency  D/ced foley 12/20  PVRs reviewed  Timed voiding, spoke to nursing, increased time to q4 hours  Darifenacin 7.5 started on 12/26, d/ced 1/2  Cont to monitor 17. Obesity  Body mass index is 33.19 kg/m.  Diet and exercise education  Will cont to encourage weight loss to increase endurance and promote overall health 18. HCAP with acute respiratory failure  Monitor Voriconazole levels, 12/21 lab therapeutic  Will consult with Pulm for meds at d/c 19. Constipation  Increased senna-s to 2 tabs QHS 20. Mild  hyponatremia  Na+ 134 on 1/1  Cont to monitor  LOS (Days) 14 A FACE TO FACE EVALUATION WAS PERFORMED  Cheryl Wheeler Karis Juba 10/12/2016 8:01 AM

## 2016-10-12 NOTE — Plan of Care (Signed)
Problem: RH BOWEL ELIMINATION Goal: RH STG MANAGE BOWEL W/MEDICATION W/ASSISTANCE STG Manage Bowel with Medication with min Assistance.  Outcome: Progressing Patient is timed toileted however, remains incontinent of bowel. Calls for assistance after brief is soiled.  Problem: RH BLADDER ELIMINATION Goal: RH STG MANAGE BLADDER WITH EQUIPMENT WITH ASSISTANCE STG Manage Bladder With Equipment With max Assistance  Outcome: Progressing Patient is time toileted however, patient remains incontinent of bladder. Calls for assistance after brief is soiled even when offered bathroom assistance.

## 2016-10-12 NOTE — Plan of Care (Signed)
Problem: RH SKIN INTEGRITY Goal: RH STG ABLE TO PERFORM INCISION/WOUND CARE W/ASSISTANCE STG Able To Perform incontinence Care With mod Assistance.  Outcome: Not Progressing Patient calls for assistance after brief is soiled. Patient is on a timed toileting order however, does not call for assistance or use the bathroom when offered by staff.

## 2016-10-13 ENCOUNTER — Inpatient Hospital Stay: Payer: Medicare HMO | Admitting: Acute Care

## 2016-10-13 ENCOUNTER — Inpatient Hospital Stay (HOSPITAL_COMMUNITY): Payer: Medicare HMO | Admitting: Physical Therapy

## 2016-10-13 ENCOUNTER — Inpatient Hospital Stay (HOSPITAL_COMMUNITY): Payer: Medicare HMO | Admitting: Occupational Therapy

## 2016-10-13 LAB — GLUCOSE, CAPILLARY
GLUCOSE-CAPILLARY: 60 mg/dL — AB (ref 65–99)
Glucose-Capillary: 95 mg/dL (ref 65–99)
Glucose-Capillary: 83 mg/dL (ref 65–99)
Glucose-Capillary: 240 mg/dL — ABNORMAL HIGH (ref 65–99)

## 2016-10-13 MED ORDER — PREDNISONE 5 MG PO TABS
10.0000 mg | ORAL_TABLET | Freq: Two times a day (BID) | ORAL | Status: DC
  Administered 2016-10-13 – 2016-10-14 (×4): 10 mg via ORAL
  Filled 2016-10-13 (×4): qty 2

## 2016-10-13 MED ORDER — INSULIN GLARGINE 100 UNIT/ML ~~LOC~~ SOLN
5.0000 [IU] | Freq: Two times a day (BID) | SUBCUTANEOUS | Status: DC
  Administered 2016-10-13 – 2016-10-14 (×3): 5 [IU] via SUBCUTANEOUS
  Filled 2016-10-13 (×4): qty 0.05

## 2016-10-13 NOTE — Progress Notes (Signed)
Physical Therapy Session Note  Patient Details  Name: Cheryl Wheeler MRN: [id number] Date of Birth: May 22, 1966  Today's Date: 10/13/2016 PT Individual Time: 1104-1200 and [id number] PT Individual Time Calculation (min): 56 min and 88 min   Short Term Goals: Week 2:  PT Short Term Goal 1 (Week 2): STG = LTG due to estimated d/c date.   Skilled Therapeutic Interventions/Progress Updates:    Treatment 1: Pt received in w/c with husband [name]([name]) present for session. Pt agreeable to tx noting 5/10 soreness in B calves and pain when therapist squeezed R calf; RN made aware. Session focused on family education/caregiver training. Discussed car transfer with pt & Christen BameRonnie return demonstrating transfer with RW & supervision at small SUV simulated height. Discussed stair negotiation with single rail; pt very fearful of negotiating stairs laterally but able to negotiate stairs facing forwards with B hands on L rail only. Pt negotiated 4 steps + 4 steps with min assist from therapist then husband return demonstrating. [name] able to demonstrate proper positioning and assistance when pt negotiated stairs. Pt ambulated 30 ft with RW & supervision from husband with therapist managing oxygen tubing. At end of session pt left sitting in w/c in room with all needs within reach & husband present. Discussed energy conservation techniques with stair negotiation; pt's husband verbalizes ability to manage oxygen tubing with transfers, ambulation & stair negotiation and understanding of pt's need for seated rest breaks. Pt on 3L/min supplemental oxygen throughout session with SpO2 remaining >95%.  Treatment 2: Pt received in w/c & agreeable to tx. RN administering pain medications and pt noting 4/10 soreness in B calves (R>L). Session focused on standing/activity tolerance, balance and BLE strengthening. Pt tolerated standing 30-40 seconds x 4 trials without BUE support without any LOB. Pt performed BLE HEP (long arc quads,  hip abduction, hamstring curls, mini squats, heel/toe raises, and marching) with BUE support and cuing for proper technique. Pt demonstrates decreased ability to shift weight posteriorly to perform toe raises & fatigues quickly with mini squats. Pt requires extended seated rest breaks between all activities/exercises 2/2 fatigue & SOB. Pt on 3L/min supplemental oxygen via nasal cannula throughout session. At end of session pt left in bed with all needs within reach & bed alarm set.   Pt demonstrates significant reliance on BUE throughout all mobility (sit<>stand, standing).   Therapy Documentation Precautions:  Precautions Precautions: Fall Precaution Comments: Monitor 02 sats Restrictions Weight Bearing Restrictions: No   See Function Navigator for Current Functional Status.   Therapy/Group: Individual Therapy  Sandi MariscalVictoria M Rosemary Mossbarger 10/13/2016, 6:09 PM

## 2016-10-13 NOTE — Progress Notes (Signed)
Called to room at 1818.  Pt noted to have difficulty breathing after an episode of n/v.   Placed O2 at 2L via n/c. Encouraged pt to cough but cough is weak.  Called respiratory, placed re-breather on pt.  Suggested to go ahead and contact rapid. Called rapid and unable to come at this time. Called on call, NO for chest xray.  Pt seen by critical care.  NG tube placed.  Pt moved to ICU. Wife request call back for room number when moved.

## 2016-10-13 NOTE — Progress Notes (Signed)
Occupational Therapy Session Note  Patient Details  Name: Cheryl Wheeler MRN: [id number] Date of Birth: 16-Jun-1966  Today's Date: 10/13/2016 OT Individual Time: [id number] OT Individual Time Calculation (min): 56 min     Short Term Goals: Week 2:  OT Short Term Goal 1 (Week 2): STGs=LTGs secondary to upcoming discharge  Skilled Therapeutic Interventions/Progress Updates:     Upon entering the room, pt supine in bed with RN present giving medications. Pt reports feeling SOB since she has not had scheduled breathing treatment prior to this session. Pt with no c/o pain this session. O2 saturation WFLs on 3 L and breathing treatment given while pt seated on EOB washing. Pt needing multiple rest breaks this session secondary to feeling SOB. Pt able to perform self care tasks with overall supervision and set up A. Steady assistance for LB dressing. Pt seated in wheelchair at sink for grooming tasks. Pt remaining seated in wheelchair at end of session with call bell and all needed items within reach.   Therapy Documentation Precautions:  Precautions Precautions: Fall Precaution Comments: Monitor 02 sats Restrictions Weight Bearing Restrictions: No General:   Vital Signs:  Pain: Pain Assessment Pain Assessment: 0-10 Pain Score: 3  Pain Type: Chronic pain Pain Location: Back Pain Orientation: Lower Pain Radiating Towards: bilateral legs Pain Descriptors / Indicators: Aching;Discomfort Pain Frequency: Constant Pain Onset: On-going Patients Stated Pain Goal: 3 Pain Intervention(s): Medication (See eMAR) ADL:   Exercises:   Other Treatments:    See Function Navigator for Current Functional Status.   Therapy/Group: Individual Therapy  [name], Vahan Wadsworth P 10/13/2016, 10:56 AM

## 2016-10-13 NOTE — Progress Notes (Signed)
[facility]ICAL [facility]IC[facility] & REHABILITATION     PROGRESS NOTE  Subjective/Complaints:  Pt sitting up in bed this AM.  She slept well.  She notes no difference with change in urinary meds.  ROS: +Urinary urgency. Denies CP, SOB, N/V/D.  Objective: Vital Signs: Blood pressure (!) 142/63, pulse 83, temperature 98.1 F (36.7 C), temperature source Oral, resp. rate 18, height 4' 11.5" (1.511 m), weight 81.3 kg (179 lb 4.8 oz), SpO2 100 %. No results found.  Recent Labs  10/11/16 1354  WBC 7.3  HGB 11.8*  HCT 38.1  PLT 156    Recent Labs  10/11/16 1354 10/12/16 0840  NA 134*  --   K 4.2  --   CL 91*  --   GLUCOSE 159*  --   BUN 16  --   CREATININE 0.72 0.50  CALCIUM 9.1  --    CBG (last 3)   Recent Labs  10/12/16 1653 10/12/16 2059 10/13/16 0744  GLUCAP 190* 193* 95    Wt Readings from Last 3 Encounters:  10/13/16 81.3 kg (179 lb 4.8 oz)  09/28/16 82.7 kg (182 lb 5.1 oz)  09/17/16 81.2 kg (179 lb 1.6 oz)    Physical Exam:  BP (!) 142/63 (BP Location: Left Arm)   Pulse 83   Temp 98.1 F (36.7 C) (Oral)   Resp 18   Ht 4' 11.5" (1.511 m)   Wt 81.3 kg (179 lb 4.8 oz)   SpO2 100%   BMI 35.61 kg/m  Constitutional: She appears well-developed. NAD. HENT: Normocephalic and atraumatic.  Eyes: EOM are normal. No discharge.  Cardiovascular: RRR. No JVD. Respiratory: +[place]. Clear, Unlabored GI: Soft. Bowel sounds are normal.  Musculoskeletal: She exhibits no edema or tenderness.  Neurological: She is alert and oriented.  Motor: 4+/5 grossly throughout (stable) Skin: Skin is warm and dry.  Psychiatric: She has a normal mood and affect. Her behavior is normal.   Assessment/Plan: 1. Functional deficits secondary to debility which require 3+ hours per day of interdisciplinary therapy in a comprehensive inpatient rehab setting. Physiatrist is providing close team supervision and 24 hour management of active medical problems listed below. Physiatrist and rehab team  continue to assess barriers to discharge/monitor patient progress toward functional and medical goals.  Function:  Bathing Bathing position   Position: Wheelchair/chair at sink  Bathing parts Body parts bathed by patient: Right arm, Left arm, Chest, Abdomen, Front perineal area, Right upper leg, Left upper leg, Right lower leg, Left lower leg Body parts bathed by helper: Buttocks, Back  Bathing assist Assist Level: Touching or steadying assistance(Pt > 75%)   Set up : To obtain items  Upper Body Dressing/Undressing Upper body dressing   What is the patient wearing?: Pull over shirt/dress     Pull over shirt/dress - Perfomed by patient: Thread/unthread right sleeve, Thread/unthread left sleeve, Put head through opening, Pull shirt over trunk          Upper body assist Assist Level: Set up   Set up : To obtain clothing/put away  Lower Body Dressing/Undressing Lower body dressing   What is the patient wearing?: Pants, Non-skid slipper socks, Ted Hose Underwear - Performed by patient: Thread/unthread right underwear leg, Thread/unthread left underwear leg Underwear - Performed by helper: Pull underwear up/down Pants- Performed by patient: Thread/unthread right pants leg, Thread/unthread left pants leg, Pull pants up/down Pants- Performed by helper: Pull pants up/down Non-skid slipper socks- Performed by patient: Don/doff right sock, Don/doff left sock Non-skid slipper  socks- Performed by helper: Don/doff right sock, Don/doff left sock               TED Hose - Performed by helper: Don/doff right TED hose, Don/doff left TED hose  Lower body assist Assist for lower body dressing: Supervision or verbal cues, Set up      Toileting Toileting Toileting activity did not occur: No continent bowel/bladder event Toileting steps completed by patient: Adjust clothing prior to toileting Toileting steps completed by helper: Performs perineal hygiene, Adjust clothing after  toileting Toileting Assistive Devices: Grab bar or rail  Toileting assist Assist level: Touching or steadying assistance (Pt.75%)   Transfers Chair/bed transfer   Chair/bed transfer method: Ambulatory, Stand pivot Chair/bed transfer assist level: Supervision or verbal cues Chair/bed transfer assistive device: Armrests, Patent attorney     Max distance: 45 ft Assist level: Supervision or verbal cues   Wheelchair   Type: Manual Max wheelchair distance: 177ft  Assist Level: Supervision or verbal cues  Cognition Comprehension Comprehension assist level: Understands complex 90% of the time/cues 10% of the time  Expression Expression assist level: Expresses complex 90% of the time/cues < 10% of the time  Social Interaction Social Interaction assist level: Interacts appropriately with others with medication or extra time (anti-anxiety, antidepressant).  Problem Solving Problem solving assist level: Solves basic problems with no assist  Memory Memory assist level: Recognizes or recalls 75 - 89% of the time/requires cueing 10 - 24% of the time    Medical Problem List and Plan: 1.  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 Continue BiPAP and oxygen therapy.   Tapering prednisone with slow wean  Cont CIR therapies 2. DVT Prophylaxis/Anticoagulation: Subcutaneous Lovenox. Monitor platelet counts and any signs of bleeding. Recent vascular studies negative. 3. Pain Management/chronic back pain: MSIR 15 mg twice a day as needed 4. Mood/bipolar disorder: Remeron 45 mg daily at bedtime, valproic acid 500 mg twice a day, Ativan 0.5 mg 3 times a day as needed 5. Neuropsych: This patient is capable of making decisions on her own behalf. 6. Skin/Wound Care: Routine skin checks 7. Fluids/Electrolytes/Nutrition: Routine I&Os 8. Hypertension. Labetalol 300 mg 3 times a day, Norvasc 10 mg  daily.   Overall controlled 1/3  Monitor with increased mobility 9. Acute on chronic diastolic congestive heart failure.   Lasix 40 mg twice a day, Cr. Stable 1/2  Monitor for any signs of fluid overload 10. Diabetes mellitus with peripheral neuropathy. Hemoglobin A1c 6.5.    Check blood sugars before meals and at bedtime.  Lantus insulin 16 units BID, decreased to 13U on 12/24, decreased to 10U on 12/28, decreased to 5U on 1/3.   Added Novolog 4U 12/24, increased to 7U on 12/25, increased to 10U on 12/26  Labile 1/3 11. Chronic granulomatous disease. Patient on chronic Bactrim lifelong prior to admission and continued 12. Hyperlipidemia. Lipitor 13. GERD. Protonix 14. Hypoalbuminemia  Supplement started 12/20 15. ABLA  Hb 11.8 on 1/1  Cont to monitor 16. Urinary Incontinence due to urgency  D/ced foley 12/20  PVRs reviewed  Timed voiding, spoke to nursing, increased time to q4 hours  Darifenacin 7.5 started on 12/26, d/ced 1/2  Cont to monitor 17. Obesity  Body mass index is 35.61 kg/m.  Diet and exercise education  Will cont to encourage weight loss to increase endurance and promote overall health 18. HCAP with acute respiratory failure  Monitor Voriconazole  levels, 12/21 lab therapeutic  No changes in meds per Pulm/ID 19. Constipation  Increased senna-s to 2 tabs QHS 20. Mild hyponatremia  Na+ 134 on 1/1  Cont to monitor  LOS (Days) 15 A FACE TO FACE EVALUATION WAS PERFORMED  Bonner Larue Karis Juba 10/13/2016 8:25 AM

## 2016-10-14 ENCOUNTER — Inpatient Hospital Stay (HOSPITAL_COMMUNITY): Payer: Medicare HMO | Admitting: Occupational Therapy

## 2016-10-14 ENCOUNTER — Inpatient Hospital Stay (HOSPITAL_COMMUNITY): Payer: Medicare HMO

## 2016-10-14 ENCOUNTER — Inpatient Hospital Stay (HOSPITAL_COMMUNITY): Payer: Medicare HMO | Admitting: Physical Therapy

## 2016-10-14 ENCOUNTER — Telehealth: Payer: Self-pay | Admitting: Pharmacist

## 2016-10-14 DIAGNOSIS — R339 Retention of urine, unspecified: Secondary | ICD-10-CM

## 2016-10-14 LAB — GLUCOSE, CAPILLARY
GLUCOSE-CAPILLARY: 130 mg/dL — AB (ref 65–99)
GLUCOSE-CAPILLARY: 83 mg/dL (ref 65–99)
GLUCOSE-CAPILLARY: 114 mg/dL — AB (ref 65–99)
Glucose-Capillary: 79 mg/dL (ref 65–99)
Glucose-Capillary: 234 mg/dL — ABNORMAL HIGH (ref 65–99)

## 2016-10-14 MED ORDER — BETHANECHOL CHLORIDE 25 MG PO TABS
25.0000 mg | ORAL_TABLET | Freq: Three times a day (TID) | ORAL | Status: DC
  Administered 2016-10-14 – 2016-10-16 (×7): 25 mg via ORAL
  Filled 2016-10-14 (×7): qty 1

## 2016-10-14 NOTE — Progress Notes (Signed)
Pt incontinent of bladder with PVR of . I&O cath for at 1230am.

## 2016-10-14 NOTE — Progress Notes (Signed)
Physical Therapy Session Note  Patient Details  Name: Cheryl Wheeler MRN: [id number] Date of Birth: 12-Jan-1966  Today's Date: 10/14/2016 PT Individual Time: 1303-1403 PT Individual Time Calculation (min): 60 min    Short Term Goals: Week 2:  PT Short Term Goal 1 (Week 2): STG = LTG due to estimated d/c date.   Skilled Therapeutic Interventions/Progress Updates:    Session 2: Pt propelling w/c from/to room during session, pt reports fatigue but no rest break needed. Gait training performed at 40 ft X2 and 50 ft X1. SpO2 at or above 95% after ambulation. Pt reports LE fatigue and SOB as primary limitations with gait. NRE- standing balance activities as tolerated with staggered stance and narrowing base (no UE support). Pt requiring seated rest between balance activities. TEx: LAQ, seated marching, ball squeeze, hip abduction - each exercise bilateral 1X10 reps. Pt using 3L O2 throughout session, continues to require seated rest breaks throughout session.   Therapy Documentation Precautions:  Precautions Precautions: Fall Precaution Comments: Monitor 02 sats Restrictions Weight Bearing Restrictions: No General:   Vital Signs: Oxygen Therapy SpO2: 95-97% O2 Device: Nasal Cannula O2 Flow Rate (L/min): 3 L/min Pain: 4-5 /10 in knees, aching. Monitored during session.     See Function Navigator for Current Functional Status.   Therapy/Group: Individual Therapy  Christiane HaBenjamin J. Graycee Greeson, PT, CSCS Pager [phone] Office [phone]  10/14/2016, 3:26 PM

## 2016-10-14 NOTE — Progress Notes (Signed)
Occupational Therapy Session Note  Patient Details  Name: Cheryl NissenJonce Ann Wheeler MRN: [id number] Date of Birth: 1966-01-22  Today's Date: 10/14/2016 OT Individual Time: 1022-1200 OT Individual Time Calculation (min): 98 min     Short Term Goals: Week 2:  OT Short Term Goal 1 (Week 2): STGs=LTGs secondary to upcoming discharge   Skilled Therapeutic Interventions/Progress Updates:     Upon entering the room, pt seated in wheelchair with no c/o pain this session. Pt reporting fatigue from prior PT session but agreeable to OT intervention. Pt propelled wheelchair 150' towards elevator with supervision and increased time. OT assisted pt the rest of the way to gift shop. Pt propelled wheelchair throughout gift shop over thresholds, maneuvering tight turns, and down aisle with increased time. Pt reports feeling slightly SOB with activity as she hasn't had breathing treatment yet but O2 remains above 90% throughout session with rest breaks as needed. Pt verbalizing having BM in brief and OT assisted pt back to room. OT assisted pt with hygiene and clothing management for thoroughness. Pt performed stand pivot transfer with RW and supervision back to wheelchair and call bell and all needed items within reach upon exiting the room.   Therapy Documentation Precautions:  Precautions Precautions: Fall Precaution Comments: Monitor 02 sats Restrictions Weight Bearing Restrictions: No General:   Vital Signs:   Pain:   ADL:   Exercises:   Other Treatments:    See Function Navigator for Current Functional Status.   Therapy/Group: Individual Therapy  [name], Hani Patnode P 10/14/2016, 12:28 PM

## 2016-10-14 NOTE — Patient Care Conference (Signed)
\Inpatient RehabilitationTeam Conference and Plan of Care Update Date: 10/13/2016   Time: 2:30 PM2    Patient Name: Cheryl Wheeler      Medical Record Number: [id number]  Date of Birth: [birth year] Sex: Female         Room/Bed: 4W11C/4W11C-01 Payor Info: Payor: [employer] MEDICARE / Plan: [employer] MEDICARE HMO/PPO / Product Type: *No Product type* /    Admitting Diagnosis: resp failure  debility   Admit Date/Time:  09/28/2016  5:05 PM Admission Comments: No comment available   Primary Diagnosis:  <principal problem not specified> Principal Problem: <principal problem not specified>  Patient Active Problem List   Diagnosis Date Noted  . Urinary retention   . Hyponatremia   . Urinary urgency   . Labile blood pressure   . Constipation due to pain medication   . Diabetes mellitus type 2 in obese (HCC)   . Labile blood glucose   . Acute on chronic respiratory failure with hypoxia (HCC)   . Debilitated 09/28/2016  . Difficulty in walking, not elsewhere classified   . Chronic pain syndrome   . Gastroesophageal reflux disease without esophagitis   . Hyperlipidemia   . Fungal infection 09/27/2016  . Invasive aspergillosis (HCC)   . Acute respiratory failure with hypoxemia (HCC) 09/17/2016  . Chronic granulomatous disease (HCC)   . HCAP (healthcare-associated pneumonia)   . Benign essential HTN   . Type 2 diabetes mellitus with peripheral neuropathy (HCC)   . Respiratory failure (HCC)   . Opacity of lung on imaging study   . Chronic obstructive pulmonary disease (HCC)   . Bipolar affective disorder (HCC)   . Essential hypertension   . Acute on chronic diastolic heart failure (HCC)   . Urinary incontinence   . Acute blood loss anemia   . Hypoalbuminemia due to protein-calorie malnutrition (HCC)   . 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 COPD exacerbation (HCC) 09/06/2016  . Granulomatous disease (HCC) 09/06/2016  . Diastolic heart failure (HCC) 09/06/2016  . DM (diabetes mellitus)  (HCC) 09/06/2016  . HTN (hypertension) 09/06/2016  . Bipolar disorder (HCC) 09/06/2016  . Anxiety 09/06/2016  . Chronic back pain 09/06/2016  . OSA (obstructive sleep apnea) 09/06/2016  . Obesity hypoventilation syndrome (HCC) 09/06/2016  . Acute pulmonary edema (HCC)   . CAP (community acquired pneumonia)   . Dyspnea   . Acute respiratory failure with hypoxia (HCC) 08/26/2016    Expected Discharge Date: Expected Discharge Date: 10/16/16  Team Members Present: Physician leading conference: Dr. Maryla Morrow Social Worker Present: Amada Jupiter, LCSW Nurse Present: Carmie End, RN PT Present: Katherine Mantle, PT OT Present: Callie Fielding, OT PPS Coordinator present : Tora Duck, RN, CRRN     Current Status/Progress Goal Weekly Team Focus  Medical   Debilitation with functional deficits secondary to debility after VDRF/COPD exacerbation and CIM and currently maintained on voriconazole.   Improve endurance, mobility  See above   Bowel/Bladder   Incontinent of bowel and bladder: LBM 10-12-15  Mod assist  2-4 Timed tolieting while awake    Swallow/Nutrition/ Hydration             ADL's   supervision - min A overall  overall supervision  endurance, self care, functional transfers, B UE strengthening, balance, pt/family edu   Mobility   supervision to min assist overall  supervision transfers and short distance gait; min assist stairs  family education, d/c planning, endurance, strengthening, stairs, car transfer   Communication  Safety/Cognition/ Behavioral Observations            Pain   c/o chronic back pain managed with msir prn bid effective  pain < or= 5   Assess pain q shift and prn   Skin   MASD buttocks gerdharts cream in use   no new skin breakdown/infection with mod assist   Assess skin q shirt and prn, keep patient dry      *See Care Plan and progress notes for long and short-term goals.  Barriers to Discharge: Mobility, endurance, DM, HCAP, urinary  urgency    Possible Resolutions to Barriers:  Cont antifungals, therapies, adjust DM meds, wean steroids    Discharge Planning/Teaching Needs:  Plan for pt to d/c home with spouse able to provide 24/7 assistance  teaching completed   Team Discussion:  Still weaning steroids;  Urinary incont.  Supervision to set-up with ADLs and occ. Min assist.  Still poor endurance but improved overall.  FAmily ed done.  Revisions to Treatment Plan:  none   Continued Need for Acute Rehabilitation Level of Care: The patient requires daily medical management by a physician with specialized training in physical medicine and rehabilitation for the following conditions: Daily direction of a multidisciplinary physical rehabilitation program to ensure safe treatment while eliciting the highest outcome that is of practical value to the patient.: Yes Daily medical management of patient stability for increased activity during participation in an intensive rehabilitation regime.: Yes Daily analysis of laboratory values and/or radiology reports with any subsequent need for medication adjustment of medical intervention for : Pulmonary problems;Urological problems  Cheryl Wheeler 10/14/2016, 2:51 PM

## 2016-10-14 NOTE — Progress Notes (Signed)
[facility] PHYSICAL MEDICINE & REHABILITATION     PROGRESS NOTE  Subjective/Complaints:  Pt sitting up in bed this AM.  She asks about discharge date and wants to ensure it has remained for Saturday. No issues overnight.   ROS: +Urinary urgency. Denies CP, SOB, N/V/D.  Objective: Vital Signs: Blood pressure 138/72, pulse 90, temperature 97.9 F (36.6 C), temperature source Oral, resp. rate 18, height 4' 11.5" (1.511 m), weight 76.9 kg (169 lb 8 oz), SpO2 96 %. No results found.  Recent Labs  10/11/16 1354  WBC 7.3  HGB 11.8*  HCT 38.1  PLT 156    Recent Labs  10/11/16 1354 10/12/16 0840  NA 134*  --   K 4.2  --   CL 91*  --   GLUCOSE 159*  --   BUN 16  --   CREATININE 0.72 0.50  CALCIUM 9.1  --    CBG (last 3)   Recent Labs  10/13/16 1652 10/13/16 2134 10/14/16 0646  GLUCAP 240* 60* 130*    Wt Readings from Last 3 Encounters:  10/14/16 76.9 kg (169 lb 8 oz)  09/28/16 82.7 kg (182 lb 5.1 oz)  09/17/16 81.2 kg (179 lb 1.6 oz)    Physical Exam:  BP 138/72 (BP Location: Right Arm)   Pulse 90   Temp 97.9 F (36.6 C) (Oral)   Resp 18   Ht 4' 11.5" (1.511 m)   Wt 76.9 kg (169 lb 8 oz)   SpO2 96%   BMI 33.66 kg/m  Constitutional: She appears well-developed. NAD. HENT: Normocephalic and atraumatic.  Eyes: EOM are normal. No discharge.  Cardiovascular: RRR. No JVD. Respiratory: +[place]. Clear, Unlabored GI: Soft. Bowel sounds are normal.  Musculoskeletal: She exhibits no edema or tenderness.  Neurological: She is alert and oriented.  Motor: 4+/5 grossly throughout (unchanged) Skin: Skin is warm and dry.  Psychiatric: She has a normal mood and affect. Her behavior is normal.   Assessment/Plan: 1. Functional deficits secondary to debility which require 3+ hours per day of interdisciplinary therapy in a comprehensive inpatient rehab setting. Physiatrist is providing close team supervision and 24 hour management of active medical problems listed  below. Physiatrist and rehab team continue to assess barriers to discharge/monitor patient progress toward functional and medical goals.  Function:  Bathing Bathing position   Position: Sitting EOB  Bathing parts Body parts bathed by patient: Right arm, Left arm, Chest, Abdomen, Front perineal area, Right upper leg, Left upper leg, Right lower leg, Left lower leg Body parts bathed by helper: Buttocks, Back  Bathing assist Assist Level: Touching or steadying assistance(Pt > 75%), Set up   Set up : To obtain items  Upper Body Dressing/Undressing Upper body dressing   What is the patient wearing?: Pull over shirt/dress     Pull over shirt/dress - Perfomed by patient: Thread/unthread right sleeve, Thread/unthread left sleeve, Put head through opening, Pull shirt over trunk          Upper body assist Assist Level: Set up, Supervision or verbal cues   Set up : To obtain clothing/put away  Lower Body Dressing/Undressing Lower body dressing   What is the patient wearing?: Pants, Non-skid slipper socks Underwear - Performed by patient: Thread/unthread right underwear leg, Thread/unthread left underwear leg Underwear - Performed by helper: Pull underwear up/down Pants- Performed by patient: Thread/unthread right pants leg, Thread/unthread left pants leg, Pull pants up/down, Fasten/unfasten pants Pants- Performed by helper: Pull pants up/down Non-skid slipper socks- Performed by patient:  Don/doff right sock, Don/doff left sock Non-skid slipper socks- Performed by helper: Don/doff right sock, Don/doff left sock               TED Hose - Performed by helper: Don/doff right TED hose, Don/doff left TED hose  Lower body assist Assist for lower body dressing: Supervision or verbal cues, Set up   Set up : To obtain clothing/put away  Toileting Toileting Toileting activity did not occur: No continent bowel/bladder event Toileting steps completed by patient: Adjust clothing prior to  toileting Toileting steps completed by helper: Performs perineal hygiene, Adjust clothing after toileting Toileting Assistive Devices: Grab bar or rail  Toileting assist Assist level: Touching or steadying assistance (Pt.75%)   Transfers Chair/bed transfer   Chair/bed transfer method: Ambulatory, Stand pivot Chair/bed transfer assist level: Supervision or verbal cues Chair/bed transfer assistive device: Armrests, Patent attorneyWalker     Locomotion Ambulation     Max distance: 30 ft Assist level: Supervision or verbal cues   Wheelchair   Type: Manual Max wheelchair distance: 50 ft Assist Level: Supervision or verbal cues  Cognition Comprehension Comprehension assist level: Understands complex 90% of the time/cues 10% of the time  Expression Expression assist level: Expresses complex 90% of the time/cues < 10% of the time  Social Interaction Social Interaction assist level: Interacts appropriately with others with medication or extra time (anti-anxiety, antidepressant).  Problem Solving Problem solving assist level: Solves basic problems with no assist  Memory Memory assist level: Recognizes or recalls 75 - 89% of the time/requires cueing 10 - 24% of the time    Medical Problem List and Plan: 1.  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 Continue BiPAP and oxygen therapy.   Tapering prednisone with slow wean  Cont CIR therapies 2. DVT Prophylaxis/Anticoagulation: Subcutaneous Lovenox. Monitor platelet counts and any signs of bleeding. Recent vascular studies negative. 3. Pain Management/chronic back pain: MSIR 15 mg twice a day as needed 4. Mood/bipolar disorder: Remeron 45 mg daily at bedtime, valproic acid 500 mg twice a day, Ativan 0.5 mg 3 times a day as needed 5. Neuropsych: This patient is capable of making decisions on her own behalf. 6. Skin/Wound Care: Routine skin checks 7.  Fluids/Electrolytes/Nutrition: Routine I&Os 8. Hypertension. Labetalol 300 mg 3 times a day, Norvasc 10 mg daily.   Overall controlled 1/4  Monitor with increased mobility 9. Acute on chronic diastolic congestive heart failure.   Lasix 40 mg twice a day, Cr. Stable 1/2  Monitor for any signs of fluid overload 10. Diabetes mellitus with peripheral neuropathy. Hemoglobin A1c 6.5.    Check blood sugars before meals and at bedtime.  Lantus insulin 16 units BID, decreased to 13U on 12/24, decreased to 10U on 12/28, decreased to 5U on 1/3.   Added Novolog 4U 12/24, increased to 7U on 12/25, increased to 10U on 12/26  Extremely labile with hypoglycemia 1/4 11. Chronic granulomatous disease. Patient on chronic Bactrim lifelong prior to admission and continued 12. Hyperlipidemia. Lipitor 13. GERD. Protonix 14. Hypoalbuminemia  Supplement started 12/20 15. ABLA  Hb 11.8 on 1/1  Cont to monitor 16. Urinary Incontinence due to urgency  D/ced foley 12/20  PVRs reviewed  Timed voiding, spoke to nursing, increased time to q4 hours  Darifenacin 7.5 started on 12/26, d/ced 1/2  Now appears to be retaining, will trial bethanochol 25 TID  Cont to monitor 17. Obesity  Body mass index is 33.66 kg/m.  Diet and exercise education  Will cont to encourage weight loss to increase endurance and promote overall health 18. HCAP with acute respiratory failure  Monitor Voriconazole levels, 12/21 lab therapeutic  No changes in meds per Pulm/ID 19. Constipation  Increased senna-s to 2 tabs QHS 20. Mild hyponatremia  Na+ 134 on 1/1  Cont to monitor  LOS (Days) 16 A FACE TO FACE EVALUATION WAS PERFORMED  Germani Gavilanes Karis Juba 10/14/2016 8:24 AM

## 2016-10-14 NOTE — Discharge Summary (Signed)
Discharge summary job [id number]

## 2016-10-14 NOTE — Telephone Encounter (Signed)
Called and [facility] Calh[facility] to get identification on the fungus growing on Cheryl Wheeler's trach aspirate culture.  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

## 2016-10-14 NOTE — Plan of Care (Signed)
Problem: RH Wheelchair Mobility Goal: LTG Patient will propel w/c in community environment (PT) LTG: Patient will propel wheelchair in community environment, # of feet with assist (PT)  Outcome: Not Applicable Date Met: 75/33/91 DC due to not a priority. ABG

## 2016-10-14 NOTE — Telephone Encounter (Signed)
Perfect thanks so much Cassie! 

## 2016-10-14 NOTE — Progress Notes (Signed)
Physical Therapy Session Note  Patient Details  Name: Cheryl Wheeler MRN: [id number] Date of Birth: 29-Aug-1966  Today's Date: 10/14/2016 PT Individual Time: [id number] PT Individual Time Calculation (min): 60 min    Short Term Goals: Week 2:  PT Short Term Goal 1 (Week 2): STG = LTG due to estimated d/c date.   Skilled Therapeutic Interventions/Progress Updates:    Pt donned clothing EOB with set-up assistance to prepare for therapy session. Focused on functional transfers, gait training for endurance/strengthening and functional mobility simulating home environment and obstacle negotiation, and stair negotiation for home entry practice. Pt completing transfers and gait at overall supervision level with RW with cues for upright posture and encouragement for increasing distance. Stair negotiation with bilateral hands on L rail going forward with min assist. Nustep for endurance training and overall BUE/BLE strengthening x 8 min on level 3.   Pt requires frequent rest breaks due to low endurance and increased effort of breathing though sats WNL on supplemental O2. Discussed safety and fall risk due to having a dog at home with recommendation for initial meeting to occur seated instead of meeting him at the door. Pt in agreement and verbalized understanding. Pt reports family education went well yesterday and denies concerns in regards to d/c on 1/6.  Therapy Documentation Precautions:  Precautions Precautions: Fall Precaution Comments: Monitor 02 sats Restrictions Weight Bearing Restrictions: No Vital Signs: SpO2: 96 % O2 Device: Nasal Cannula O2 Flow Rate (L/min): 3 L/min  Pain:  Denies pain.   See Function Navigator for Current Functional Status.   Therapy/Group: Individual Therapy  Karolee StampsGray, Nieko Clarin Darrol PokeBrescia  Yanisa Goodgame B. Lyriq Finerty, PT, DPT  10/14/2016, 10:09 AM

## 2016-10-15 ENCOUNTER — Inpatient Hospital Stay (HOSPITAL_COMMUNITY): Payer: Medicare HMO | Admitting: Occupational Therapy

## 2016-10-15 ENCOUNTER — Inpatient Hospital Stay (HOSPITAL_COMMUNITY): Payer: Medicare HMO

## 2016-10-15 ENCOUNTER — Other Ambulatory Visit: Payer: Self-pay | Admitting: Pharmacist

## 2016-10-15 DIAGNOSIS — B49 Unspecified mycosis: Secondary | ICD-10-CM

## 2016-10-15 LAB — GLUCOSE, CAPILLARY
GLUCOSE-CAPILLARY: 142 mg/dL — AB (ref 65–99)
Glucose-Capillary: 102 mg/dL — ABNORMAL HIGH (ref 65–99)
Glucose-Capillary: 74 mg/dL (ref 65–99)
Glucose-Capillary: 70 mg/dL (ref 65–99)

## 2016-10-15 MED ORDER — ATORVASTATIN CALCIUM 10 MG PO TABS: 10.0000 mg | ORAL_TABLET | Freq: Every day | ORAL | 0 refills | Status: AC

## 2016-10-15 MED ORDER — PREDNISONE 5 MG PO TABS
10.0000 mg | ORAL_TABLET | Freq: Every day | ORAL | Status: DC
  Administered 2016-10-15 – 2016-10-16 (×2): 10 mg via ORAL
  Filled 2016-10-15 (×2): qty 2

## 2016-10-15 MED ORDER — VALPROIC ACID 250 MG PO CAPS: 500.0000 mg | ORAL_CAPSULE | Freq: Two times a day (BID) | ORAL | 0 refills | Status: AC

## 2016-10-15 MED ORDER — AMLODIPINE BESYLATE 10 MG PO TABS: 10.0000 mg | ORAL_TABLET | Freq: Every day | ORAL | 1 refills | Status: AC

## 2016-10-15 MED ORDER — MORPHINE SULFATE 15 MG PO TABS: 15.0000 mg | ORAL_TABLET | Freq: Two times a day (BID) | ORAL | 0 refills | Status: AC | PRN

## 2016-10-15 MED ORDER — MIRTAZAPINE 30 MG PO TABS: 45.0000 mg | ORAL_TABLET | Freq: Every day | ORAL | 0 refills | Status: AC

## 2016-10-15 MED ORDER — FUROSEMIDE 20 MG PO TABS: 40.0000 mg | ORAL_TABLET | Freq: Two times a day (BID) | ORAL | 0 refills | Status: AC

## 2016-10-15 MED ORDER — LORAZEPAM 0.5 MG PO TABS: 0.5000 mg | ORAL_TABLET | Freq: Three times a day (TID) | ORAL | 0 refills | Status: AC | PRN

## 2016-10-15 MED ORDER — BETHANECHOL CHLORIDE 25 MG PO TABS: 25.0000 mg | ORAL_TABLET | Freq: Three times a day (TID) | ORAL | 0 refills | Status: AC

## 2016-10-15 MED ORDER — BUDESONIDE-FORMOTEROL FUMARATE 160-4.5 MCG/ACT IN AERO: 2.0000 | INHALATION_SPRAY | Freq: Two times a day (BID) | RESPIRATORY_TRACT | 12 refills | Status: AC

## 2016-10-15 MED ORDER — VORICONAZOLE 50 MG PO TABS: 250.0000 mg | ORAL_TABLET | Freq: Two times a day (BID) | ORAL | 0 refills | Status: DC

## 2016-10-15 MED ORDER — VORICONAZOLE 200 MG PO TABS: 200.0000 mg | ORAL_TABLET | Freq: Two times a day (BID) | ORAL | 0 refills | Status: DC

## 2016-10-15 MED ORDER — POLYETHYLENE GLYCOL 3350 17 G PO PACK: 17.0000 g | PACK | Freq: Every day | ORAL | 0 refills | Status: AC

## 2016-10-15 MED ORDER — LABETALOL HCL 300 MG PO TABS: 300.0000 mg | ORAL_TABLET | Freq: Three times a day (TID) | ORAL | 0 refills | Status: AC

## 2016-10-15 MED ORDER — GUAIFENESIN-DM 100-10 MG/5ML PO SYRP: 5.0000 mL | ORAL_SOLUTION | ORAL | 0 refills | Status: AC | PRN

## 2016-10-15 MED ORDER — OMEPRAZOLE 40 MG PO CPDR: 40.0000 mg | DELAYED_RELEASE_CAPSULE | Freq: Every day | ORAL | 0 refills | Status: AC

## 2016-10-15 MED ORDER — PREDNISONE 10 MG PO TABS: ORAL_TABLET | ORAL | 0 refills | Status: AC

## 2016-10-15 MED ORDER — GERHARDT'S BUTT CREAM: 1.0000 "application " | TOPICAL_CREAM | Freq: Three times a day (TID) | CUTANEOUS | 0 refills | Status: AC

## 2016-10-15 MED FILL — VORICONAZOLE 200 MG TABLET: 200 | 30 days supply | Qty: 60 | Fill #0

## 2016-10-15 NOTE — Discharge Summary (Signed)
NAMEKENETHA, Cheryl Wheeler NO.:  [id number]  MEDICAL RECORD NO.:  [id number]  LOCATION:  4W11C                        FACILITY:  [facility]  PHYSICIAN:  Maryla Morrow, MD        DATE OF BIRTH:  1966-07-09  DATE OF ADMISSION:  09/28/2016 DATE OF DISCHARGE:  10/16/2016                              DISCHARGE SUMMARY   DISCHARGE DIAGNOSES: 1. Debilitation with functional deficits secondary to chronic     obstructive pulmonary disease exacerbation. 2. Subcutaneous Lovenox for deep venous thrombosis prophylaxis. 3. Pain management. 4. Bipolar disorder. 5. Hypertension. 6. Acute-on-chronic diastolic congestive heart failure. 7. Steroid-induced hyperglycemia 8. Chronic granulomatous disease. 9. Hyperlipidemia. 10.Decreased nutritional storage. 11.Acute blood loss anemia. 12.History of urinary incontinence. 13.Obesity with BMI of 33.66. 14.Hospital community-acquired pneumonia. 15.Constipation.  HISTORY OF PRESENT ILLNESS:  This is a 51 year old right-handed female, history of COPD, on chronic oxygen therapy as well as BiPAP.  Chronic granulomatous disease, maintained on Bactrim for prophylaxis, chronic diastolic congestive heart failure, diabetes mellitus, hypertension, and chronic back pain.  She lives with spouse, modified independent prior to admission.  She used a wheelchair outside of the home.  Presented to [facility] on August 22, 2016, for acute-on-chronic respiratory failure, low-grade fever; hypoxic, required intubation.  Findings of hyponatremia, started on Solu-Medrol.  Transferred to [facility] for ongoing care, August 26, 2016.  Chest x-ray was negative. CT angiogram of the chest negative for pulmonary emboli, but concerning for pneumonia.  RSV-B was positive.  Completed course of broad-spectrum antibiotics.  Followed Pulmonary Services.  Required intubation. Physical and occupational therapy evaluations completed on September 07, 2016, for  recommendations of Physical Medicine and Rehab consult was admitted to Inpatient Rehab Services on September 09, 2016.  The patient with slow progressive gains.  A recent CT of the chest, September 15, 2016, at the discretion of Pulmonary Services for followup of suspect hospital community-acquired pneumonia, showed consolidation of upper lobes, left lower lobe, advised to continue antibiotic therapies.  On September 17, 2016, increasing shortness of breath, saturations dipping into the 70s.  Chest x-ray, no new abnormalities.  Pulmonary Services felt the patient should be discharged to Acute Care Services, September 17, 2016, for ongoing care.  Placed on Solu-Medrol and tapered.  Chest x- ray, September 23, 2016, stable bilateral lung opacities.  Maintained on voriconazole per Respiratory Services with plan to follow up Infectious Disease.  Tracheal aspirate, growing moderate fungus.  Maintained on subcutaneous Lovenox for DVT prophylaxis.  The patient was admitted for comprehensive rehab program.  PAST MEDICAL HISTORY:  See discharge diagnoses.  SOCIAL HISTORY:  Lives with spouse, modified independent prior to hospital admission.  FUNCTIONAL STATUS:  Upon admission to [facility], was minimal assist, 15 feet rolling walker; minimal assist, stand pivot transfers; mod-to-max assist, activities of daily living.  PHYSICAL EXAMINATION:  VITAL SIGNS:  Blood pressure 150/72, pulse 66, temperature 97, respirations 19. GENERAL:  This was an alert female, oriented to person, place, and time. HEENT:  Normocephalic.  EOMs intact.  Pupils round and reactive to light. NECK:  Supple.  Nontender.  No JVD. CARDIAC:  Regular rate and rhythm.  Normal heart sounds. RESPIRATORY:  She had no wheezes, decreased breath sounds with shallow respirations. ABDOMEN:  Soft, nontender.  No distention. MUSCULOSKELETAL:  Motor strength 4- to 4/5.  REHABILITATION HOSPITAL COURSE:  The patient was admitted to  Inpatient Rehab Services with therapies initiated on a 3-hour daily basis consisting of physical therapy, occupational therapy and rehabilitation nursing.  The following issues were addressed during the patient's rehabilitation stay.  Pertaining to Ms. Conly debilitation related to COPD exacerbation, she had since been extubated, remained on BiPAP. Prednisone taper as advised.  Chronic oxygen therapy arranged. Subcutaneous Lovenox for DVT prophylaxis.  Venous Doppler studies negative.  The patient with history of chronic back pain, using MSIR 15 mg twice daily as needed.  Acute-on-chronic congestive heart failure. Diuresis as advised.  She continued with Lasix 40 mg twice daily.  Close monitoring of blood sugars while on prednisone therapy.  Insulin was adjusted with slow taper and steroids.  Maintained on chronic Bactrim lifelong for chronic granulomatous disease as well as currently on voriconazole for hospital community-acquired pneumonia, which she would follow up Outpatient Pulmonary Services for ongoing recommendations. Bouts of constipation resolved with laxative assistance.  She initially had some urinary continence, Foley catheter tube removed on September 29, 2016.  Intermittent bouts of retaining urine, trial of Urecholine was initiated.  The patient received weekly collaborative interdisciplinary team conferences to discuss estimated length of stay, family teaching, any barriers to discharge.  Sessions focused on functional transfers, gait training, endurance strengthening, functional mobility.  She completed transfers and ambulation at the supervision level with rolling walker.  Requiring frequent rest breaks, monitoring of oxygen saturations.  She could gather her belongings for activities of daily living and homemaking with occasional rest breaks.  Full family teaching was completed.  Plan, discharge to home.  DISCHARGE MEDICATIONS: 1. Norvasc 10 mg p.o. daily. 2.  Lipitor 10 mg p.o. daily. 3. Urecholine 25 mg p.o. t.i.d., taper as directed. 4. Lasix 40 mg p.o. b.i.d. 5.Nebulizer treatments as directed: 6. Labetalol 300 mg p.o. t.i.d. 7. Remeron 45 mg p.o. at bedtime. 8. Protonix 40 mg p.o. daily. 9. MiraLax daily, hold for loose stool. 10. Prednisone 10 mg p.o. daily, taper as directed. 11. Bactrim 1 tablet p.o. every 12 hours. 12. Valproic acid 500 mg p.o. b.i.d. 13. Voriconazole 250 mg p.o. every 12 hours. 14. Ativan 0.5 mg p.o. every 8 hours as needed. 15.MSIR 15 mg p.o. b.i.d. as needed.  DIET:  Diabetic diet.  FOLLOWUP:  The patient would follow up with Pulmonary Services as directed.  Dr. Maryla MorrowAnkit Patel, appointment to be made.  Dr. Paulette Blanchornelius Van Dam, Infectious Disease, 2 weeks; Dr. Gwendlyn DeutscherJohn Redding, follow up medical management.  SPECIAL INSTRUCTIONS:  Continue chronic oxygen therapy, BiPAP as directed.     Mariam Dollaraniel Angiulli, P.A.   ______________________________ Maryla MorrowAnkit Patel, MD    DA/MEDQ  D:  10/14/2016  T:  10/15/2016  Job:  [id number]  cc:   Kandice RobinsonsSarah Groce, PA-C Durenda HurtJohn Redding II, M.D. Acey Lavornelius Van Dam, MD

## 2016-10-15 NOTE — Progress Notes (Signed)
Occupational Therapy Discharge Summary  Patient Details  Name: Cheryl Wheeler MRN: [id number] Date of Birth: [birth year]  Today's Date: 10/15/2016 OT Individual Time:  -  [id number] and [id number]   Individual Treatment Time Calculation: 64 min and 65 min  Patient has met 8  of 9 long term goals due to improved activity tolerance, improved balance, and improved awareness.  Patient to discharge at overall Supervision level.  Patient's care partner is independent to provide the necessary physical and cognitive assistance at discharge.    Reasons goals not met: Pt still requires Min A for toileting tasks.Spouse can assist at time of discharge.    Recommendation:  Patient will benefit from ongoing skilled OT services in home health setting to continue to advance functional skills in the area of BADL.  Equipment: No equipment provided  Reasons for discharge: treatment goals met  Patient/family agrees with progress made and goals achieved: Yes   Skilled Therapeutic Intervention: Pt was supine in bed at time of arrival, requesting to complete ADLs. Supine<sit without bedrails and HOB flat completed with supervision. Pt ambulated with RW to drawers to collect clothing items with supervision for min cues with 02 mgt. UB/LB bathing/dressing completed w/c level at sink with supervision for cues for energy conservation and safe hand placement during sit<stands. Pt educated on goal achievement and overall progress with verbalized feelings of pride. Pt then participated in room clean up w/c level, gathering clothing into bags w/c level for discharge tomorrow. Pt was left in w/c with spouse [name] at end of session. No DME needs or further d/c questions at this time.   2nd Session 1:1 Tx (65 min) Pt participated in skilled OT session focusing on activity tolerance, standing endurance, and discharge planning. Pt completed shower transfer in simulated home setup. Pt completed transfer with supervision and  instruction on technique to tub bench with use of RW. Pt reported that her husband Edd Arbour will purchase a back to her tub bench at home. Discussed methods for ventilating bathroom during showers to increase ease with breathing with verbalized understanding. Afterwards pt self propelled to dayroom. FM coloring/jewelry making activities completed in sitting and standing as tolerated to improve endurance and balance. At end of session pt self propelled back to her room and transferred back to bed in manner as written above. She was left with all needs within reach at time of departure.   OT Discharge Precautions/Restrictions Precautions Precautions: Fall Precaution Comments: Monitor 02 sats Restrictions Weight Bearing Restrictions: No General   Vital Signs Oxygen Therapy SpO2: 98 % O2 Device: Nasal Cannula O2 Flow Rate (L/min): 3 L/min Pain Pain Assessment Pain Assessment: 0-10 Pain Score: 2  Pain Type: Chronic pain Pain Location: Back Pain Orientation: Upper;Mid Pain Descriptors / Indicators: Aching Patients Stated Pain Goal: 2 Pain Intervention(s): Medication (See eMAR) Multiple Pain Sites: No ADL ADL ADL Comments: Please see functional navigator for ADL status Vision/Perception  Vision- History Baseline Vision/History: Wears glasses Wears Glasses: Reading only Patient Visual Report: No change from baseline Vision- Assessment Vision Assessment?: No apparent visual deficits  Cognition Overall Cognitive Status: Within Functional Limits for tasks assessed Arousal/Alertness: Awake/alert Orientation Level: Oriented X4 Attention: Sustained Sustained Attention: Appears intact Memory: Appears intact Awareness: Appears intact Problem Solving: Appears intact Safety/Judgment: Appears intact Sensation Sensation Light Touch: Impaired Detail Light Touch Impaired Details: Impaired RLE;Impaired LLE;Impaired LUE Stereognosis: Not tested Hot/Cold: Appears Intact Proprioception:  Appears Intact Coordination Gross Motor Movements are Fluid and Coordinated: Yes Fine Motor Movements are Fluid and  Coordinated: Yes Motor  Motor Motor: Within Functional Limits Motor - Discharge Observations: Global weakness Mobility  Transfers Transfers: Sit to Stand;Stand to Sit Sit to Stand: 5: Supervision;From toilet Stand to Sit: 5: Supervision  Trunk/Postural Assessment  Cervical Assessment Cervical Assessment: Exceptions to [facility] Thoracic Assessment Thoracic Assessment: Exceptions to [facility] (kyphotic) Lumbar Assessment Lumbar Assessment: Exceptions to [facility] (posterior pelvic tilt) Postural Control Postural Control: Within Functional Limits  Balance Balance Balance Assessed: Yes Dynamic Sitting Balance Dynamic Sitting - Balance Support: Right upper extremity supported;Left upper extremity supported Dynamic Sitting - Level of Assistance: 7: Independent Dynamic Standing Balance Dynamic Standing - Balance Support: No upper extremity supported Dynamic Standing - Level of Assistance: 5: Stand by assistance Dynamic Standing - Balance Activities: Other (comment) (during ADLs) Extremity/Trunk Assessment RUE Assessment RUE Assessment: Exceptions to WFL (3/5 MMT) LUE Assessment LUE Assessment: Exceptions to [facility] (3/5 MMT)   See Function Navigator for Current Functional Status.  Norvil Martensen A Leonore Frankson 10/15/2016, 8:27 PM

## 2016-10-15 NOTE — Discharge Instructions (Signed)
Inpatient Rehab Discharge Instructions  Cheryl NissenJonce Ann Wheeler Discharge date and time: No discharge date for patient encounter.   Activities/Precautions/ Functional Status: Activity: activity as tolerated Diet: diabetic diet Wound Care: none needed Functional status:  ___ No restrictions     ___ Walk up steps independently ___ 24/7 supervision/assistance   ___ Walk up steps with assistance ___ Intermittent supervision/assistance  ___ Bathe/dress independently ___ Walk with walker     _x__ Bathe/dress with assistance ___ Walk Independently    ___ Shower independently ___ Walk with assistance    ___ Shower with assistance ___ No alcohol     ___ Return to work/school ________    COMMUNITY REFERRALS UPON DISCHARGE:    Home Health:   PT     OT     RN                      Agency[facility]  [facility] Phone: [phone]       Special Instructions: Continue oxygen therapy as directed   My questions have been answered and I understand these instructions. I will adhere to these goals and the provided educational materials after my discharge from the hospital.  Patient/Caregiver Signature _______________________________ Date __________  Clinician Signature _______________________________________ Date __________  Please bring this form and your medication list with you to all your follow-up doctor's appointments.

## 2016-10-15 NOTE — Progress Notes (Signed)
Physical Therapy Session Note  Patient Details  Name: Cheryl Wheeler MRN: [id number] Date of Birth: Feb 05, 1966  Today's Date: 10/15/2016 PT Individual Time: 1300-1356 PT Individual Time Calculation (min): 56 min    Short Term Goals: Week 2:  PT Short Term Goal 1 (Week 2): STG = LTG due to estimated d/c date.   Skilled Therapeutic Interventions/Progress Updates:    Pt with bowel movement in brief upon arrival. Total A for hygiene and changing of brief - supervision in standing to complete. w/c mobility at supervision level on unit for general mobility and overall strengthening/endurance. Session focused on grad day activities to prepare for discharge tomorrow. Reviewed simulated SUV transfers with RW with supervision, bed mobility on flat bed to simulate home, gait over uneven surfaces and controlled/home environments (max of 56' today due to low endurance) with RW, stair negotiation to prepare for home entry with L rail with min assist, and education to prepare for discharge.   Pt on 3L O2 via [place] during session.   Therapy Documentation Precautions:  Precautions Precautions: Fall Precaution Comments: Monitor 02 sats Restrictions Weight Bearing Restrictions: No   Pain:  Denies pain.   See Function Navigator for Current Functional Status.   Therapy/Group: Individual Therapy  Karolee StampsGray, Elvi Leventhal Darrol PokeBrescia  Toluwani Ruder B. Donnavan Covault, PT, DPT  10/15/2016, 3:14 PM

## 2016-10-15 NOTE — Progress Notes (Signed)
Physical Therapy Discharge Summary  Patient Details  Name: Cheryl Wheeler MRN: [id number] Date of Birth: January 21, 1966    Patient has met 7 of 8 long term goals due to improved activity tolerance, improved balance, improved postural control, increased strength, decreased pain and ability to compensate for deficits.  Patient to discharge at supervision household ambulation with rw. Pt using w/c for longer distances.   Patient's husband is independent to provide the necessary physical assistance at discharge.  Reasons goals not met:  Max distance average 50 ft due to low endurance.   Recommendation:  Patient will benefit from ongoing skilled PT services in home health setting to continue to advance safe functional mobility, address ongoing impairments in endurance, strength, balance, and minimize fall risk.  Equipment: No equipment provided, pt already owns rw and w/c.   Reasons for discharge: treatment goals met and discharge from hospital  Patient/family agrees with progress made and goals achieved: Yes  PT Discharge Precautions/Restrictions Precautions Precautions: Fall Restrictions Weight Bearing Restrictions: No Cognition Overall Cognitive Status: Within Functional Limits for tasks assessed Arousal/Alertness: Awake/alert Orientation Level: Oriented X4 Sensation Coordination Gross Motor Movements are Fluid and Coordinated: Yes Fine Motor Movements are Fluid and Coordinated: Yes Motor  Motor Motor: Within Functional Limits Motor - Discharge Observations: generalized weakness and deconditioning  Balance Static Sitting Balance Static Sitting - Level of Assistance: 7: Independent Dynamic Sitting Balance Dynamic Sitting - Level of Assistance: 7: Independent Static Standing Balance Static Standing - Level of Assistance: 5: Stand by assistance Dynamic Standing Balance Dynamic Standing - Level of Assistance: 5: Stand by assistance Extremity Assessment      RLE  Assessment RLE Assessment: Exceptions to [facility] RLE Strength RLE Overall Strength: Deficits RLE Overall Strength Comments: grossly WFL, decreased muscular strength and endurance LLE Strength LLE Overall Strength Comments: grossly WFL, decreased muscular strength and endurance   See Function Navigator for Current Functional Status.  Cassell Clement, PT, CSCS Pager [phone] Office [phone]  10/15/2016, 3:32 PM

## 2016-10-15 NOTE — Plan of Care (Signed)
Problem: RH Ambulation Goal: LTG Patient will ambulate in controlled environment (PT) LTG: Patient will ambulate in a controlled environment, # of feet with assistance (PT).  Outcome: Adequate for Discharge Max distance average 50 ft due to low endurance.

## 2016-10-15 NOTE — Plan of Care (Signed)
Problem: RH Toileting Goal: LTG Patient will perform toileting w/assist, cues/equip (OT) LTG: Patient will perform toiletiing (clothes management/hygiene) with assist, with/without cues using equipment (OT)  Outcome: Not Met (add Reason) Pt still requires min A for hygiene. Spouse Edd Arbour has agreed to provide assistance at home

## 2016-10-15 NOTE — Progress Notes (Signed)
[place] PHYSICAL MEDICINE & REHABILITATION     PROGRESS NOTE  Subjective/Complaints:  Pt seen laying in bed this AM.  She slept well overnight.  She is looking forward to going home tomorrow.   ROS: Denies CP, SOB, N/V/D.  Objective: Vital Signs: Blood pressure (!) 147/74, pulse 94, temperature 98 F (36.7 C), temperature source Oral, resp. rate 18, height 4' 11.5" (1.511 m), weight 79.1 kg (174 lb 4.8 oz), SpO2 96 %. No results found. No results for input(s): WBC, HGB, HCT, PLT in the last 72 hours.  Recent Labs  10/12/16 0840  CREATININE 0.50   CBG (last 3)   Recent Labs  10/14/16 2106 10/14/16 2208 10/15/16 0630  GLUCAP 79 234* 102*    Wt Readings from Last 3 Encounters:  10/15/16 79.1 kg (174 lb 4.8 oz)  09/28/16 82.7 kg (182 lb 5.1 oz)  09/17/16 81.2 kg (179 lb 1.6 oz)    Physical Exam:  BP (!) 147/74 (BP Location: Left Arm)   Pulse 94   Temp 98 F (36.7 C) (Oral)   Resp 18   Ht 4' 11.5" (1.511 m)   Wt 79.1 kg (174 lb 4.8 oz)   SpO2 96%   BMI 34.62 kg/m  Constitutional: She appears well-developed. NAD. HENT: Normocephalic and atraumatic.  Eyes: EOM are normal. No discharge.  Cardiovascular: RRR. No JVD. Respiratory: +[place]. Clear, Unlabored GI: Soft. Bowel sounds are normal.  Musculoskeletal: She exhibits no edema or tenderness.  Neurological: She is alert and oriented.  Motor: 4+/5 grossly throughout (stable) Skin: Skin is warm and dry.  Psychiatric: She has a normal mood and affect. Her behavior is normal.   Assessment/Plan: 1. Functional deficits secondary to debility which require 3+ hours per day of interdisciplinary therapy in a comprehensive inpatient rehab setting. Physiatrist is providing close team supervision and 24 hour management of active medical problems listed below. Physiatrist and rehab team continue to assess barriers to discharge/monitor patient progress toward functional and medical goals.  Function:  Bathing Bathing  position   Position: Sitting EOB  Bathing parts Body parts bathed by patient: Right arm, Left arm, Chest, Abdomen, Front perineal area, Right upper leg, Left upper leg, Right lower leg, Left lower leg Body parts bathed by helper: Buttocks, Back  Bathing assist Assist Level: Touching or steadying assistance(Pt > 75%), Set up   Set up : To obtain items  Upper Body Dressing/Undressing Upper body dressing   What is the patient wearing?: Pull over shirt/dress     Pull over shirt/dress - Perfomed by patient: Thread/unthread right sleeve, Thread/unthread left sleeve, Put head through opening, Pull shirt over trunk          Upper body assist Assist Level: Set up, Supervision or verbal cues   Set up : To obtain clothing/put away  Lower Body Dressing/Undressing Lower body dressing   What is the patient wearing?: Pants, Non-skid slipper socks Underwear - Performed by patient: Thread/unthread right underwear leg, Thread/unthread left underwear leg Underwear - Performed by helper: Pull underwear up/down Pants- Performed by patient: Thread/unthread right pants leg, Thread/unthread left pants leg, Pull pants up/down, Fasten/unfasten pants Pants- Performed by helper: Pull pants up/down Non-skid slipper socks- Performed by patient: Don/doff right sock, Don/doff left sock Non-skid slipper socks- Performed by helper: Don/doff right sock, Don/doff left sock               TED Hose - Performed by helper: Don/doff right TED hose, Don/doff left TED hose  Lower body assist  Assist for lower body dressing: Supervision or verbal cues, Set up   Set up : To obtain clothing/put away  Toileting Toileting Toileting activity did not occur: No continent bowel/bladder event Toileting steps completed by patient: Adjust clothing prior to toileting Toileting steps completed by helper: Performs perineal hygiene, Adjust clothing after toileting Toileting Assistive Devices: Grab bar or rail  Toileting assist  Assist level: Touching or steadying assistance (Pt.75%)   Transfers Chair/bed transfer   Chair/bed transfer method: Ambulatory Chair/bed transfer assist level: Supervision or verbal cues Chair/bed transfer assistive device: Armrests, Patent attorneyWalker     Locomotion Ambulation     Max distance: 50 ft Assist level: Supervision or verbal cues   Wheelchair   Type: Manual Max wheelchair distance: 150 ft Assist Level: Supervision or verbal cues  Cognition Comprehension Comprehension assist level: Understands complex 90% of the time/cues 10% of the time  Expression Expression assist level: Expresses complex 90% of the time/cues < 10% of the time  Social Interaction Social Interaction assist level: Interacts appropriately with others with medication or extra time (anti-anxiety, antidepressant).  Problem Solving Problem solving assist level: Solves basic problems with no assist  Memory Memory assist level: Recognizes or recalls 75 - 89% of the time/requires cueing 10 - 24% of the time    Medical Problem List and Plan: 1.  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 Continue BiPAP and oxygen therapy.   Cont tapering prednisone with slow wean  Cont CIR therapies, plan to d/c tomorrow  Will see pt for transitional care management in 1-2 weeks. 2. DVT Prophylaxis/Anticoagulation: Subcutaneous Lovenox. Monitor platelet counts and any signs of bleeding. Recent vascular studies negative. 3. Pain Management/chronic back pain: MSIR 15 mg twice a day as needed 4. Mood/bipolar disorder: Remeron 45 mg daily at bedtime, valproic acid 500 mg twice a day, Ativan 0.5 mg 3 times a day as needed 5. Neuropsych: This patient is capable of making decisions on her own behalf. 6. Skin/Wound Care: Routine skin checks 7. Fluids/Electrolytes/Nutrition: Routine I&Os 8. Hypertension. Labetalol 300 mg 3 times a day, Norvasc 10 mg  daily.   Overall controlled 1/5  Monitor with increased mobility 9. Acute on chronic diastolic congestive heart failure.   Lasix 40 mg twice a day, Cr. Stable 1/2  Monitor for any signs of fluid overload 10. Diabetes mellitus with peripheral neuropathy. Hemoglobin A1c 6.5.    Check blood sugars before meals and at bedtime.  Lantus insulin 16 units BID, decreased to 13U on 12/24, decreased to 10U on 12/28, decreased to 5U on 1/3, d/ced 1/5.   Added Novolog 4U 12/24, increased to 7U on 12/25, increased to 10U on 12/26, d/ced 1/5  Extremely labile 1/5, will cont to wean steroids and will not discharge on medications as further adjustments will need to be made.  Pt was not on insulin PTA.   11. Chronic granulomatous disease. Patient on chronic Bactrim lifelong prior to admission and continued 12. Hyperlipidemia. Lipitor 13. GERD. Protonix 14. Hypoalbuminemia  Supplement started 12/20 15. ABLA  Hb 11.8 on 1/1  Cont to monitor 16. Urinary Incontinence due to urgency  D/ced foley 12/20  PVRs reviewed  Timed voiding, spoke to nursing, increased time to q4 hours  Darifenacin 7.5 started on 12/26, d/ced 1/2  Now appears to be retaining, will trial bethanochol 25 TID  ?Improving  UA/Ucx ordered  Cont to monitor 17. Obesity  Body mass index is 34.62 kg/m.  Diet  and exercise education  Will cont to encourage weight loss to increase endurance and promote overall health 18. HCAP with acute respiratory failure  Monitor Voriconazole levels, 12/21 lab therapeutic  No changes in meds per Pulm/ID 19. Constipation  Increased senna-s to 2 tabs QHS 20. Mild hyponatremia  Na+ 134 on 1/1  Cont to monitor  LOS (Days) 17 A FACE TO FACE EVALUATION WAS PERFORMED  Cheryl Wheeler 10/15/2016 8:11 AM

## 2016-10-16 LAB — GLUCOSE, CAPILLARY: Glucose-Capillary: 114 mg/dL — ABNORMAL HIGH (ref 65–99)

## 2016-10-16 NOTE — Progress Notes (Signed)
[place] PHYSICAL MEDICINE & REHABILITATION     PROGRESS NOTE  Subjective/Complaints:    ROS: Denies CP, SOB, N/V/D.  Objective: Vital Signs: Blood pressure 138/71, pulse (!) 103, temperature 98.8 F (37.1 C), temperature source Oral, resp. rate 18, height 4' 11.5" (1.511 m), weight 80.1 kg (176 lb 9.4 oz), SpO2 95 %. No results found. No results for input(s): WBC, HGB, HCT, PLT in the last 72 hours. No results for input(s): NA, K, CL, GLUCOSE, BUN, CREATININE, CALCIUM in the last 72 hours.  Invalid input(s): CO CBG (last 3)   Recent Labs  10/15/16 1658 10/15/16 2108 10/16/16 0634  GLUCAP 70 142* 114*    Wt Readings from Last 3 Encounters:  10/16/16 80.1 kg (176 lb 9.4 oz)  09/28/16 82.7 kg (182 lb 5.1 oz)  09/17/16 81.2 kg (179 lb 1.6 oz)    Physical Exam:  BP 138/71   Pulse (!) 103   Temp 98.8 F (37.1 C) (Oral)   Resp 18   Ht 4' 11.5" (1.511 m)   Wt 80.1 kg (176 lb 9.4 oz)   SpO2 95%   BMI 35.07 kg/m  Constitutional: She appears well-developed. NAD. HENT: Normocephalic and atraumatic.  Eyes: EOM are normal. No discharge.  Cardiovascular: RRR. No JVD. Respiratory: +[place]. Clear, Unlabored GI: Soft. Bowel sounds are normal.  Musculoskeletal: She exhibits no edema or tenderness.  Neurological: She is alert and oriented.  Motor: 4+/5 grossly throughout (stable) Skin: Skin is warm and dry.  Psychiatric: She has a normal mood and affect. Her behavior is normal.   Assessment/Plan: 1. Functional deficits secondary to debility  Stable for D/C today F/u PCP in 3-4 weeks F/u PM&R 2 weeks See D/C summary See D/C instructions Function:  Bathing Bathing position   Position: Wheelchair/chair at sink  Bathing parts Body parts bathed by patient: Right arm, Left arm, Chest, Abdomen, Front perineal area, Right upper leg, Left upper leg, Right lower leg, Left lower leg, Buttocks, Back Body parts bathed by helper: Buttocks, Back  Bathing assist Assist Level:  Supervision or verbal cues   Set up : To obtain items  Upper Body Dressing/Undressing Upper body dressing   What is the patient wearing?: Pull over shirt/dress     Pull over shirt/dress - Perfomed by patient: Thread/unthread right sleeve, Thread/unthread left sleeve, Put head through opening, Pull shirt over trunk          Upper body assist Assist Level: Set up   Set up : To obtain clothing/put away  Lower Body Dressing/Undressing Lower body dressing   What is the patient wearing?: Underwear, Pants, Ted Hose, Non-skid slipper socks Underwear - Performed by patient: Thread/unthread right underwear leg, Thread/unthread left underwear leg, Pull underwear up/down Underwear - Performed by helper: Pull underwear up/down Pants- Performed by patient: Thread/unthread right pants leg, Thread/unthread left pants leg, Pull pants up/down, Fasten/unfasten pants Pants- Performed by helper: Pull pants up/down Non-skid slipper socks- Performed by patient: Don/doff right sock, Don/doff left sock Non-skid slipper socks- Performed by helper: Don/doff right sock, Don/doff left sock               TED Hose - Performed by helper: Don/doff right TED hose, Don/doff left TED hose  Lower body assist Assist for lower body dressing: Supervision or verbal cues   Set up : To obtain clothing/put away  Toileting Toileting Toileting activity did not occur: No continent bowel/bladder event Toileting steps completed by patient: Adjust clothing prior to toileting, Adjust clothing after toileting  Toileting steps completed by helper: Performs perineal hygiene Toileting Assistive Devices: Grab bar or rail  Toileting assist Assist level: Touching or steadying assistance (Pt.75%)   Transfers Chair/bed transfer   Chair/bed transfer method: Ambulatory, Stand pivot Chair/bed transfer assist level: Supervision or verbal cues Chair/bed transfer assistive device: Armrests, Patent attorney      Max distance: 109' Assist level: Supervision or verbal cues   Wheelchair   Type: Manual Max wheelchair distance: 150 ft Assist Level: Supervision or verbal cues  Cognition Comprehension Comprehension assist level: Understands complex 90% of the time/cues 10% of the time  Expression Expression assist level: Expresses complex 90% of the time/cues < 10% of the time  Social Interaction Social Interaction assist level: Interacts appropriately with others with medication or extra time (anti-anxiety, antidepressant).  Problem Solving Problem solving assist level: Solves basic problems with no assist  Memory Memory assist level: Recognizes or recalls 75 - 89% of the time/requires cueing 10 - 24% of the time    Medical Problem List and Plan: 1.  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 Continue BiPAP and oxygen therapy.   Cont tapering prednisone with slow wean  plan to d/c today  Will see pt for transitional care management in 1-2 weeks. 2. DVT Prophylaxis/Anticoagulation: Subcutaneous Lovenox. Monitor platelet counts and any signs of bleeding. Recent vascular studies negative. 3. Pain Management/chronic back pain: MSIR 15 mg twice a day as needed 4. Mood/bipolar disorder: Remeron 45 mg daily at bedtime, valproic acid 500 mg twice a day, Ativan 0.5 mg 3 times a day as needed 5. Neuropsych: This patient is capable of making decisions on her own behalf. 6. Skin/Wound Care: Routine skin checks 7. Fluids/Electrolytes/Nutrition: Routine I&Os 8. Hypertension. Labetalol 300 mg 3 times a day, Norvasc 10 mg daily.   Overall controlled 1/5  Monitor with increased mobility 9. Acute on chronic diastolic congestive heart failure.   Lasix 40 mg twice a day, Cr. Stable 1/2  Monitor for any signs of fluid overload 10. Diabetes mellitus with peripheral neuropathy. Hemoglobin A1c 6.5.    Check blood sugars  before meals and at bedtime.  Lantus insulin 16 units BID, decreased to 13U on 12/24, decreased to 10U on 12/28, decreased to 5U on 1/3, d/ced 1/5.   Added Novolog 4U 12/24, increased to 7U on 12/25, increased to 10U on 12/26, d/ced 1/5  Improved CBG (last 3)   Recent Labs  10/15/16 1658 10/15/16 2108 10/16/16 0634  GLUCAP 70 142* 114*      11. Chronic granulomatous disease. Patient on chronic Bactrim lifelong prior to admission and continued 12. Hyperlipidemia. Lipitor 13. GERD. Protonix 14. Hypoalbuminemia  Supplement started 12/20 15. ABLA  Hb 11.8 on 1/1  Cont to monitor 16. Urinary Incontinence due to urgency   17. Obesity  Body mass index is 35.07 kg/m.  Diet and exercise education  Will cont to encourage weight loss to increase endurance and promote overall health 18. HCAP with acute respiratory failure  Monitor Voriconazole levels, 12/21 lab therapeutic  No changes in meds per Pulm/ID 19. Constipation  Increased senna-s to 2 tabs QHS 20. Mild hyponatremia  Na+ 134 on 1/1  Cont to monitor  LOS (Days) 18 A FACE TO FACE EVALUATION WAS PERFORMED  Claudette Laws E 10/16/2016 12:07 PM

## 2016-10-16 NOTE — Progress Notes (Signed)
Discharged via wheelchair to care of husband.  Denies having any questions.  States she has all needed equipment at home.  Husband brought oxygen in car for trip home.  Denies any discomfort at present.

## 2016-10-18 ENCOUNTER — Telehealth: Payer: Self-pay

## 2016-10-18 NOTE — Telephone Encounter (Signed)
  Appointment time was canceled due to transportation. Asked patient was there anything we could do to help with transportation to and from her appointments she said not at this time.  She will give us a call back later this week with a date and time that is more convenient for her and her ride.    Transitional Care call- Cheryl Wheeler 1. Are you/is patient experiencing any problems since coming home? No Are there any questions regarding any aspect of care? No 2. Are there any questions regarding medications administration/dosing? No Are meds being taken as prescribed? Patient should review meds with caller to confirm 3. Have there been any falls? no 4. Has Home Health been to the house and/or have they contacted you? Yes, will be there between 1pm-2pm today 10/18/2016    5. Are bowels and bladder emptying properly? Yes  6. Any fevers, problems with breathing, unexpected pain? No 7. Are there any skin problems or new areas of breakdown? No 8. Has the patient/family member arranged specialty MD follow up (ie cardiology/neurology/renal/surgical/etc)? Yes Can we help arrange? No 9. Does the patient need any other services or support that we can help arrange? Not at this time, poss. transportation 10. Are caregivers following through as expected in assisting the patient? Yes 11. Has the patient quit smoking, drinking alcohol, or using drugs as recommended? Patient doesn't use tobacco products, illicit drugs or alcohol.  Appointment time was canceled due to transportation. Asked patient was there anything we could do to help with transportation to and from her appointments she said not at this time. She will give us a call back later this week with a date and time that is more

## 2016-10-18 NOTE — Progress Notes (Signed)
Note on 10/13/16 at 9pm charted in error.

## 2016-10-18 NOTE — Progress Notes (Signed)
Social Work  Discharge Note  The overall goal for the admission was met for:   Discharge location: Yes - home with husband who is providing 24/7 assistance  Length of Stay: Yes - 17 days  Discharge activity level: Yes - supervision overall  Home/community participation: Yes  Services provided included: MD, RD, PT, OT, RN, TR, Pharmacy and [employer] [place] [employer]  Follow-up services arranged: Home Health: RN, PT, OT via [facility], Other: referred pt to the [employer] med assist program and discussed case with Cassie in pharmacy who will meet with pt next week to complete more assistance program applications and Patient/Family has no preference for HH/DME agencies  Comments (or additional information):  Patient/Family verbalized understanding of follow-up arrangements: Yes  Individual responsible for coordination of the follow-up plan: pt  Confirmed correct DME delivered: NA    Analia Zuk

## 2016-10-21 ENCOUNTER — Encounter: Payer: Self-pay | Admitting: Pharmacist

## 2016-10-21 ENCOUNTER — Encounter: Payer: Medicare HMO | Admitting: Physical Medicine & Rehabilitation

## 2016-10-21 LAB — CULTURE, RESPIRATORY

## 2016-10-21 LAB — CULTURE, RESPIRATORY W GRAM STAIN

## 2016-10-21 NOTE — Progress Notes (Signed)
Completed an application for [employer] patient assistance for Cheryl Wheeler's voriconazole.  Faxed the application 1/10.  Called today and they have completed application and need 24-48 hours to process and come up with decision.  Will f/u tomorrow before going home.  She has enough to last her until Tuesday.

## 2016-10-25 ENCOUNTER — Encounter: Payer: Self-pay | Admitting: Pharmacist

## 2016-10-25 MED ORDER — VORICONAZOLE 50 MG PO TABS: ORAL_TABLET | ORAL | 11 refills | Status: AC

## 2016-10-25 MED ORDER — VORICONAZOLE 200 MG PO TABS: ORAL_TABLET | ORAL | 11 refills | Status: AC

## 2016-10-25 NOTE — Progress Notes (Signed)
Received approval from [employer] to get Emi a year supply of voriconazole.  She is approved until Oct 10 2017. She will receive 60 day supplies with 11 refills.  Her dose is 300 mg PO BID. They will ship the medicine to her each month.

## 2016-10-28 ENCOUNTER — Other Ambulatory Visit: Payer: Self-pay | Admitting: Pharmacist

## 2016-12-08 ENCOUNTER — Ambulatory Visit: Payer: Medicare HMO | Admitting: Infectious Disease

## 2017-01-11 ENCOUNTER — Telehealth: Payer: Self-pay | Admitting: Pulmonary Disease

## 2017-01-11 NOTE — Telephone Encounter (Signed)
Spoke with Drue Flirt- she states Dr Jeanie Sewer has already agreed to be the attending for Hospice  Nothing further needed

## 2017-01-12 ENCOUNTER — Telehealth: Payer: Self-pay | Admitting: Pulmonary Disease

## 2017-01-12 NOTE — Telephone Encounter (Signed)
Left detailed message for Lynden Ang at [facility] stating that pt has never been seen in clinic so we have no records to send- if she is requesting medical records from the hospital she can call [phone].

## 2017-01-25 ENCOUNTER — Ambulatory Visit: Payer: Medicare HMO | Admitting: Infectious Disease

## 2017-02-08 DEATH — deceased

## 2017-03-14 ENCOUNTER — Ambulatory Visit: Payer: Medicare HMO | Admitting: Infectious Disease

## 2018-04-23 IMAGING — CR DG ABD PORTABLE 1V
2 series · 2 of 2 positions shown · non-contrast
Comparison: 07/21/2007

CLINICAL DATA: Orogastric tube placement

EXAM:
PORTABLE ABDOMEN - 1 VIEW

[AP (1 of 2)]
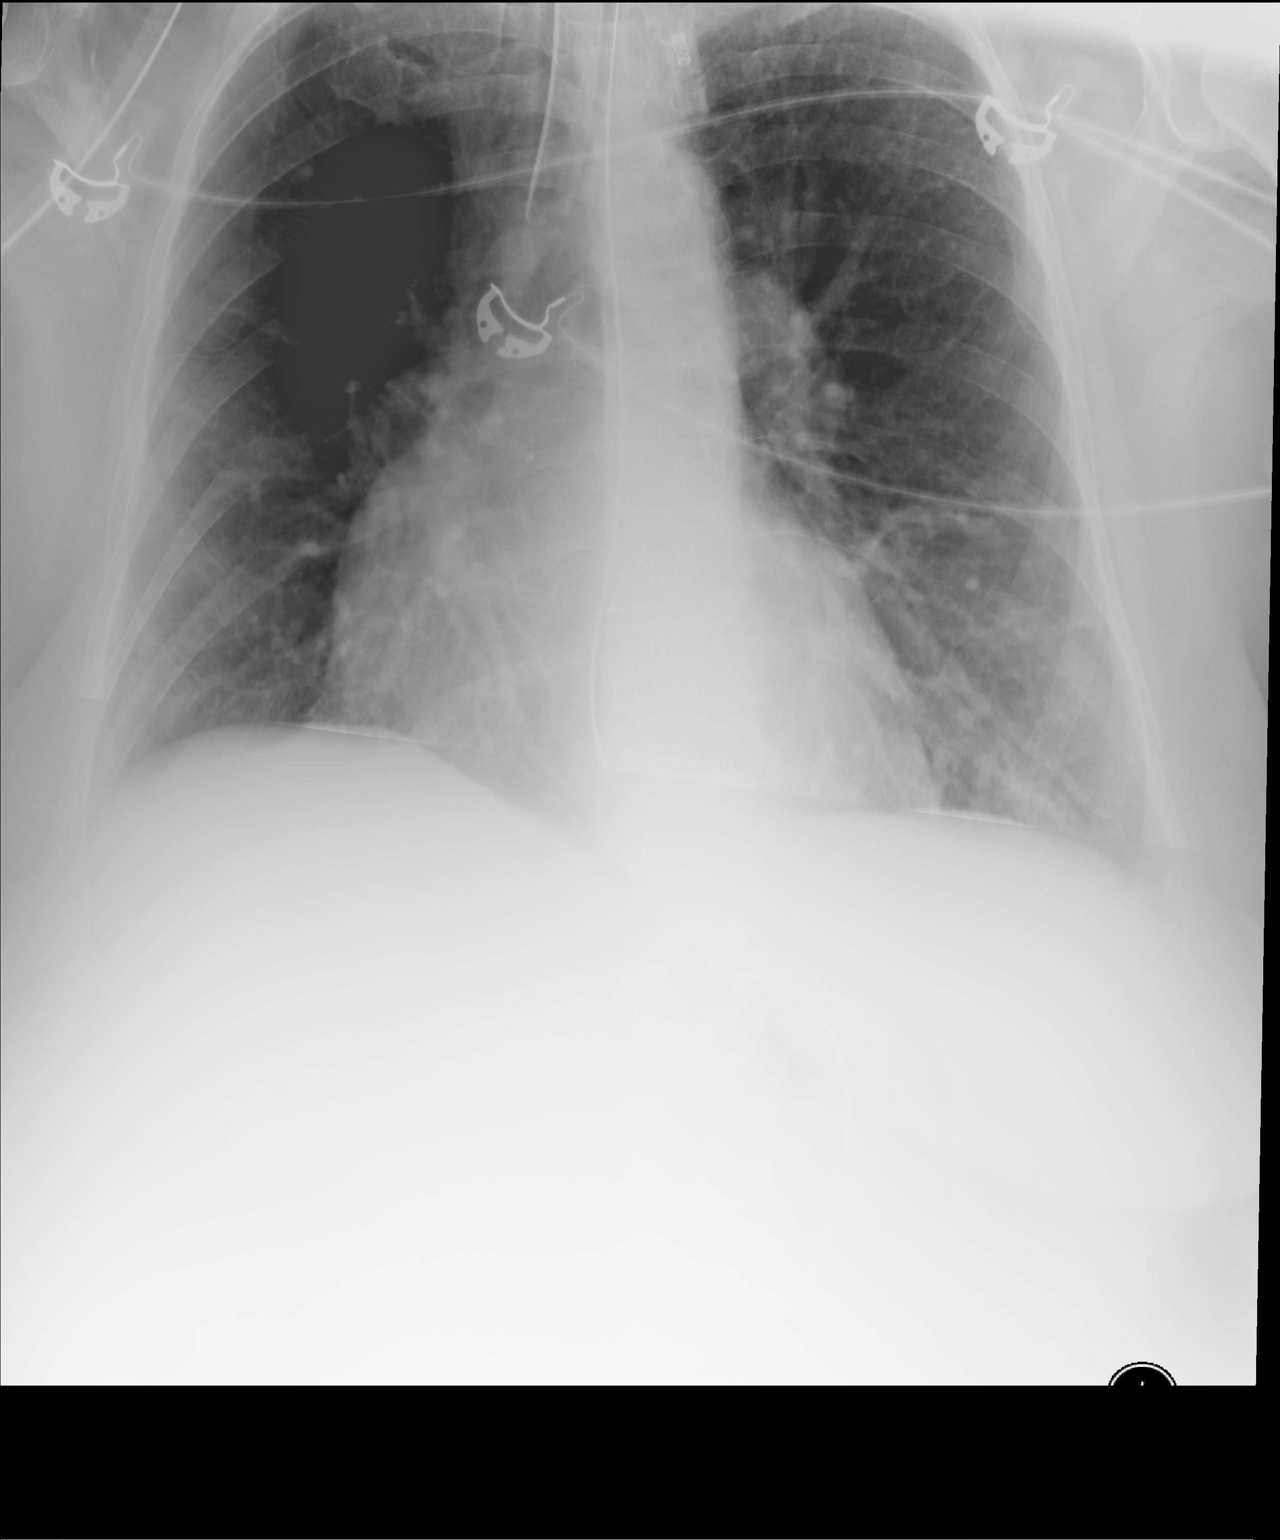

[AP (2 of 2)]
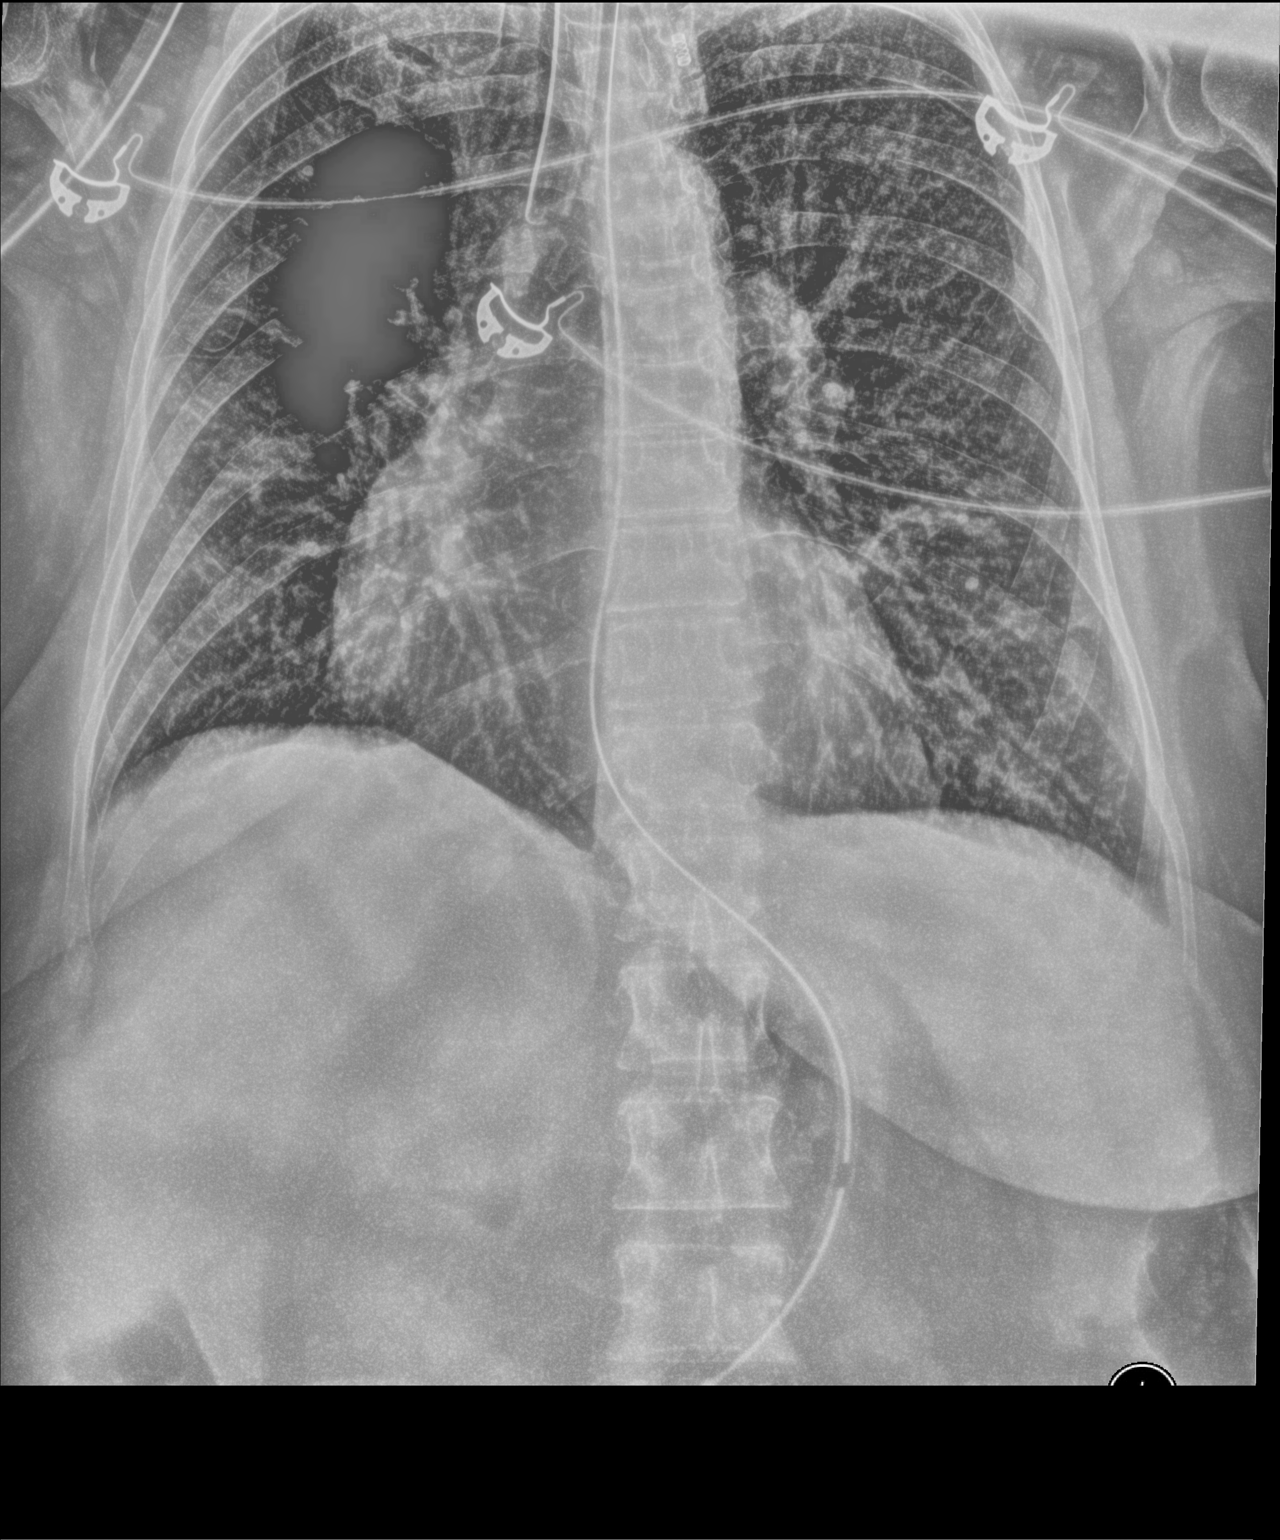

[2 of 2 positions shown; findings below may reference images not displayed]

FINDINGS: The orogastric tube extends well into the stomach with tip in the
region of the distal gastric body.
IMPRESSION: Enteric tube reaches well into the stomach.

## 2018-04-24 IMAGING — CR DG CHEST 1V PORT
1 series · 1 of 1 positions shown · non-contrast
Comparison: 08/27/2016

CLINICAL DATA: Acute respiratory failure

EXAM:
PORTABLE CHEST 1 VIEW

[AP]
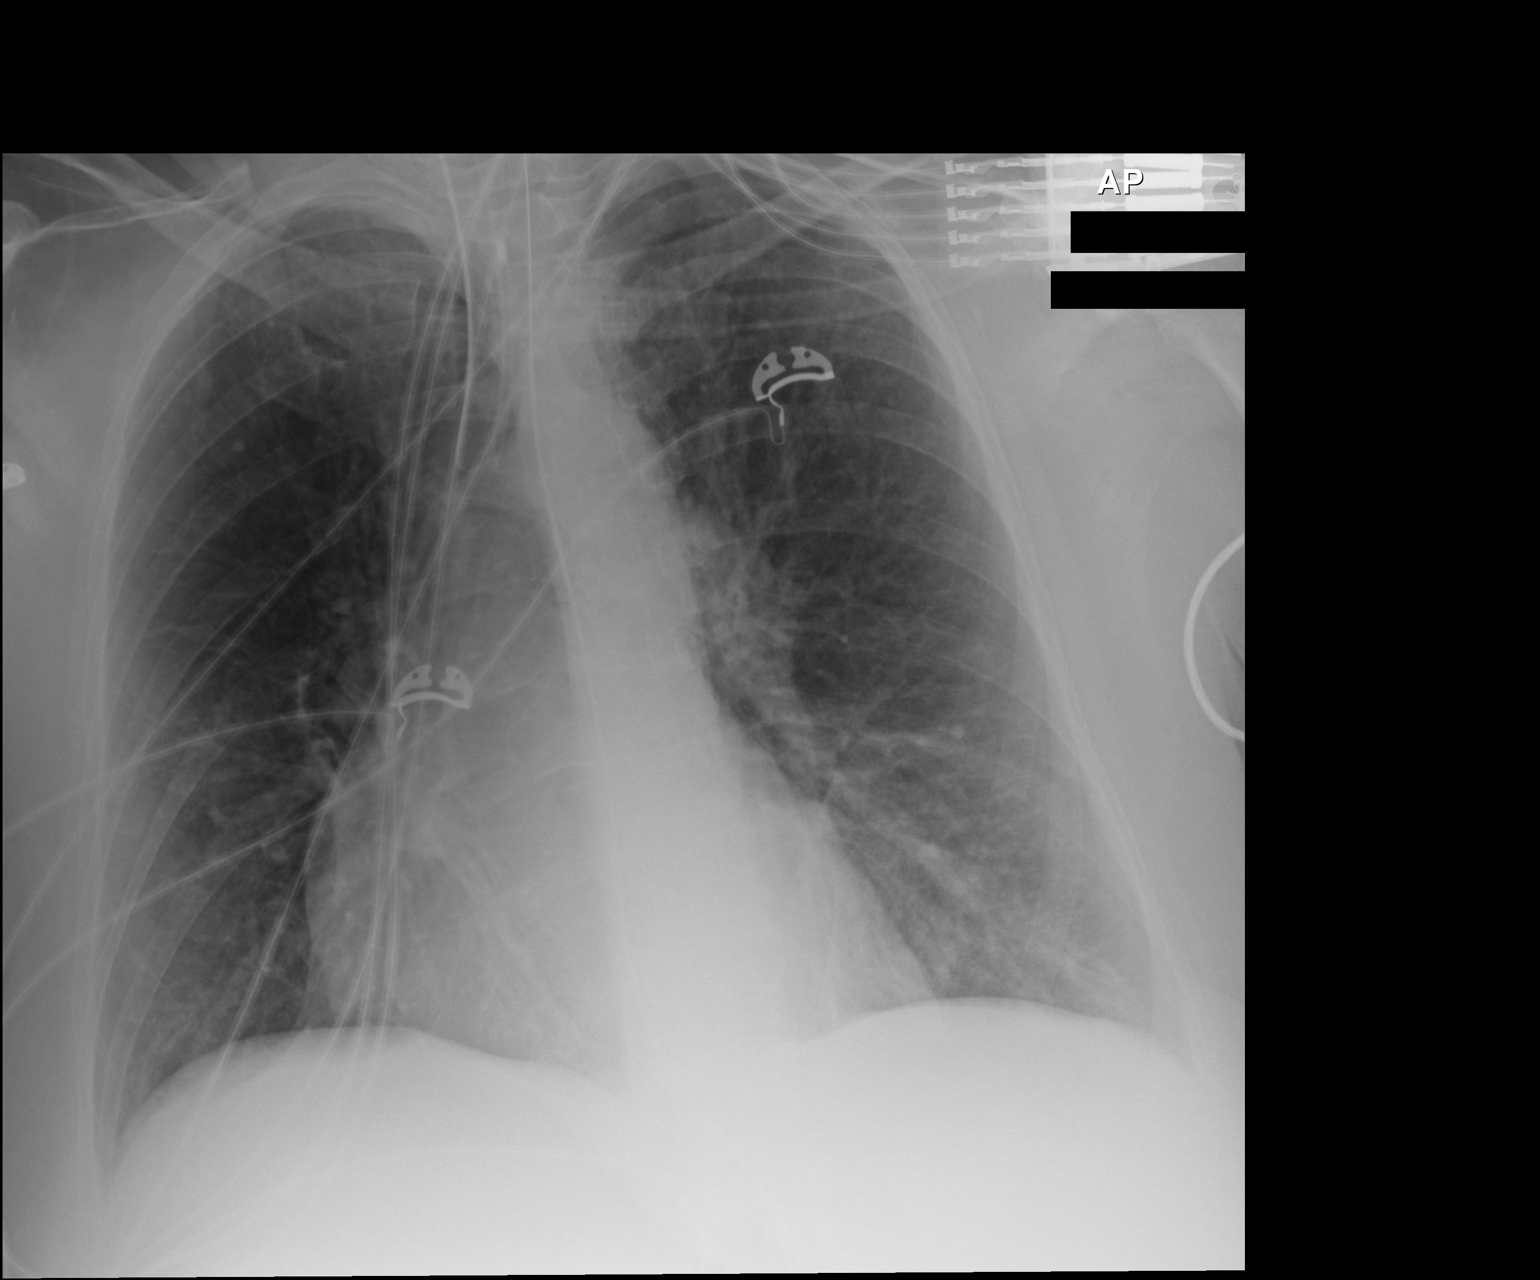

[1 of 1 positions shown; findings below may reference images not displayed]

FINDINGS: Endotracheal tube and NG tube remain in place, unchanged. There is
hyperinflation of the lungs compatible with COPD. Heart borderline
in size. Improving left basilar opacity. Right lung is clear. No
effusions.
IMPRESSION: COPD.  Improving left base atelectasis or infiltrate.

## 2018-04-27 IMAGING — CR DG CHEST 1V PORT
1 series · 1 of 1 positions shown · non-contrast
Comparison: 08/30/2016, 08/28/2016, CT chest 08/27/2016

CLINICAL DATA: 50-year-old female with a history of acute
respiratory failure and shortness of breath

EXAM:
PORTABLE CHEST 1 VIEW

[AP]
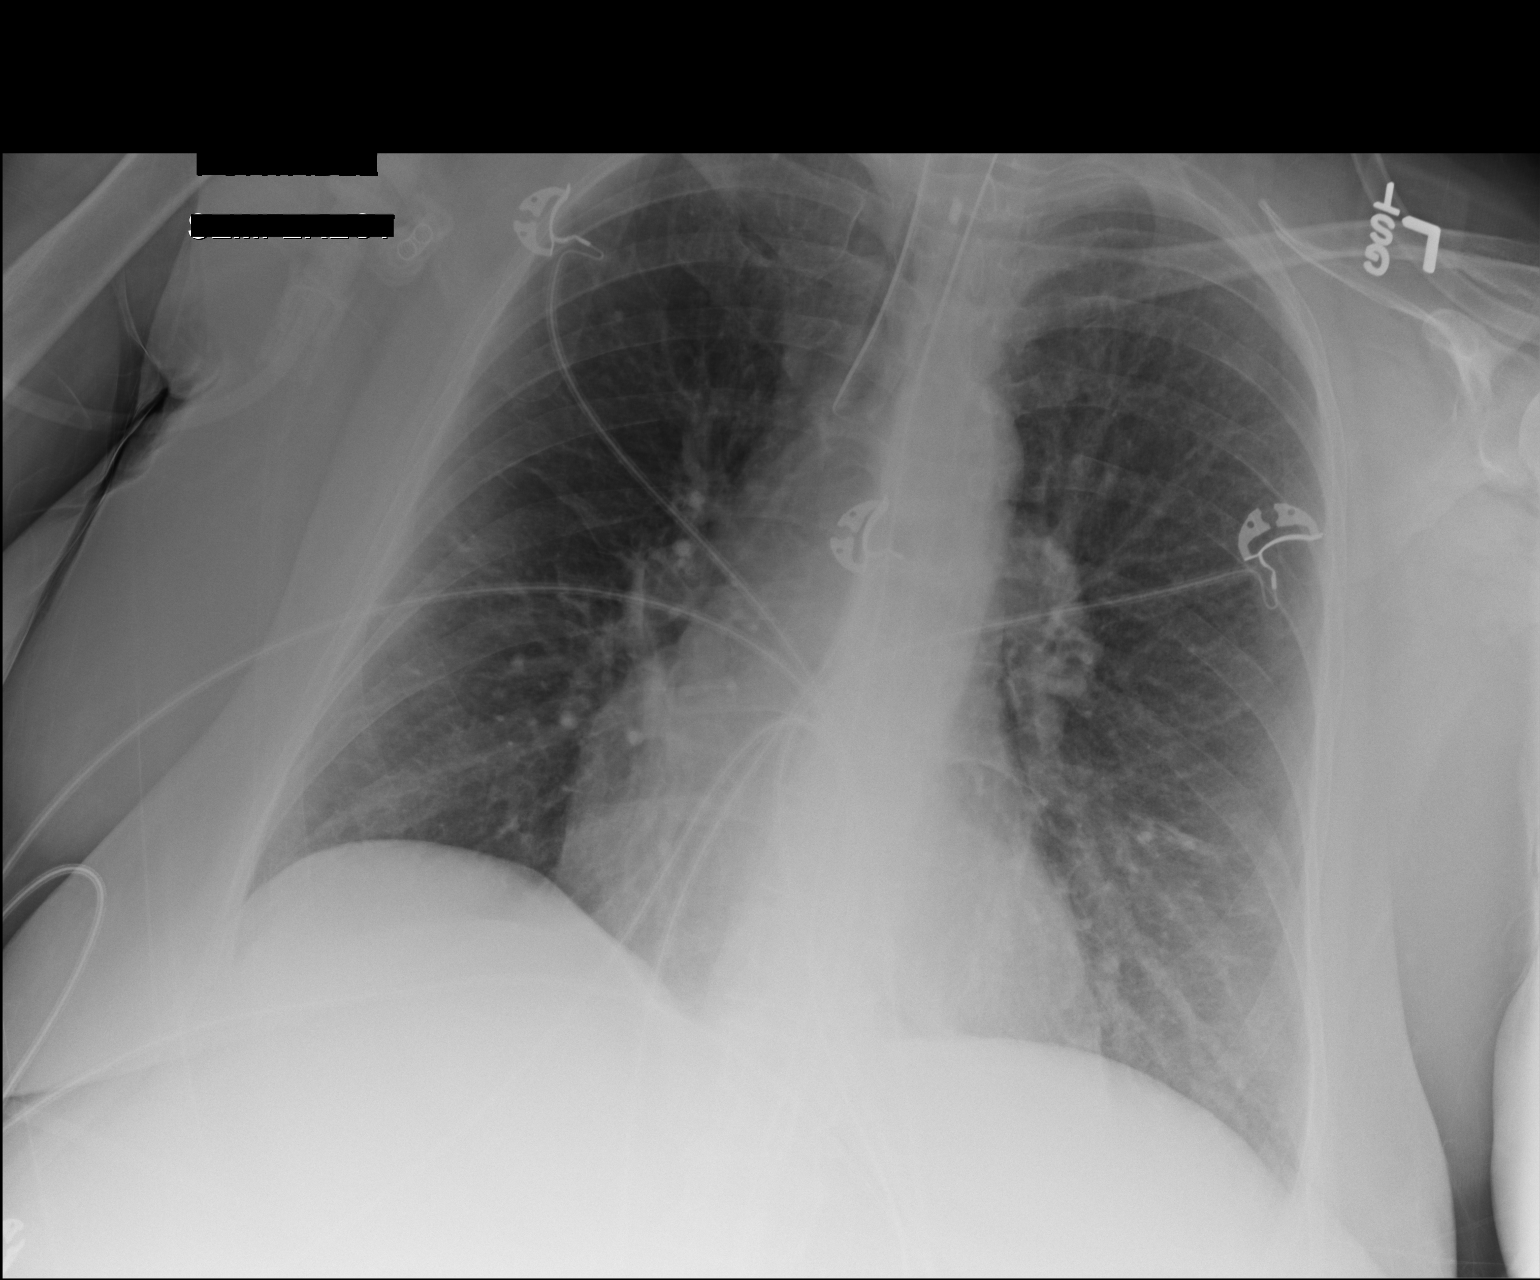

[1 of 1 positions shown; findings below may reference images not displayed]

FINDINGS: Cardiomediastinal silhouette unchanged in size and contour. No
evidence of central vascular congestion.

Similar appearance of coarsened interstitial markings with mild
reticular pattern opacity bilaterally. No confluent airspace disease
or pneumothorax.

Unchanged position of the endotracheal tube which terminates
approximately 3.2 cm above the carina.

Unchanged gastric tube terminating out of the field of view.
IMPRESSION: Similar appearance of the chest x-ray with coarsened interstitial
markings, potentially representing atelectasis/scarring and/ or
persisting airspace disease which was present on the prior CT.

Unchanged endotracheal tube and gastric tube

## 2018-04-28 IMAGING — CR DG CHEST 1V PORT
1 series · 1 of 1 positions shown · non-contrast
Comparison: August 31, 2016

CLINICAL DATA: Hypoxia

EXAM:
PORTABLE CHEST 1 VIEW

[portable]
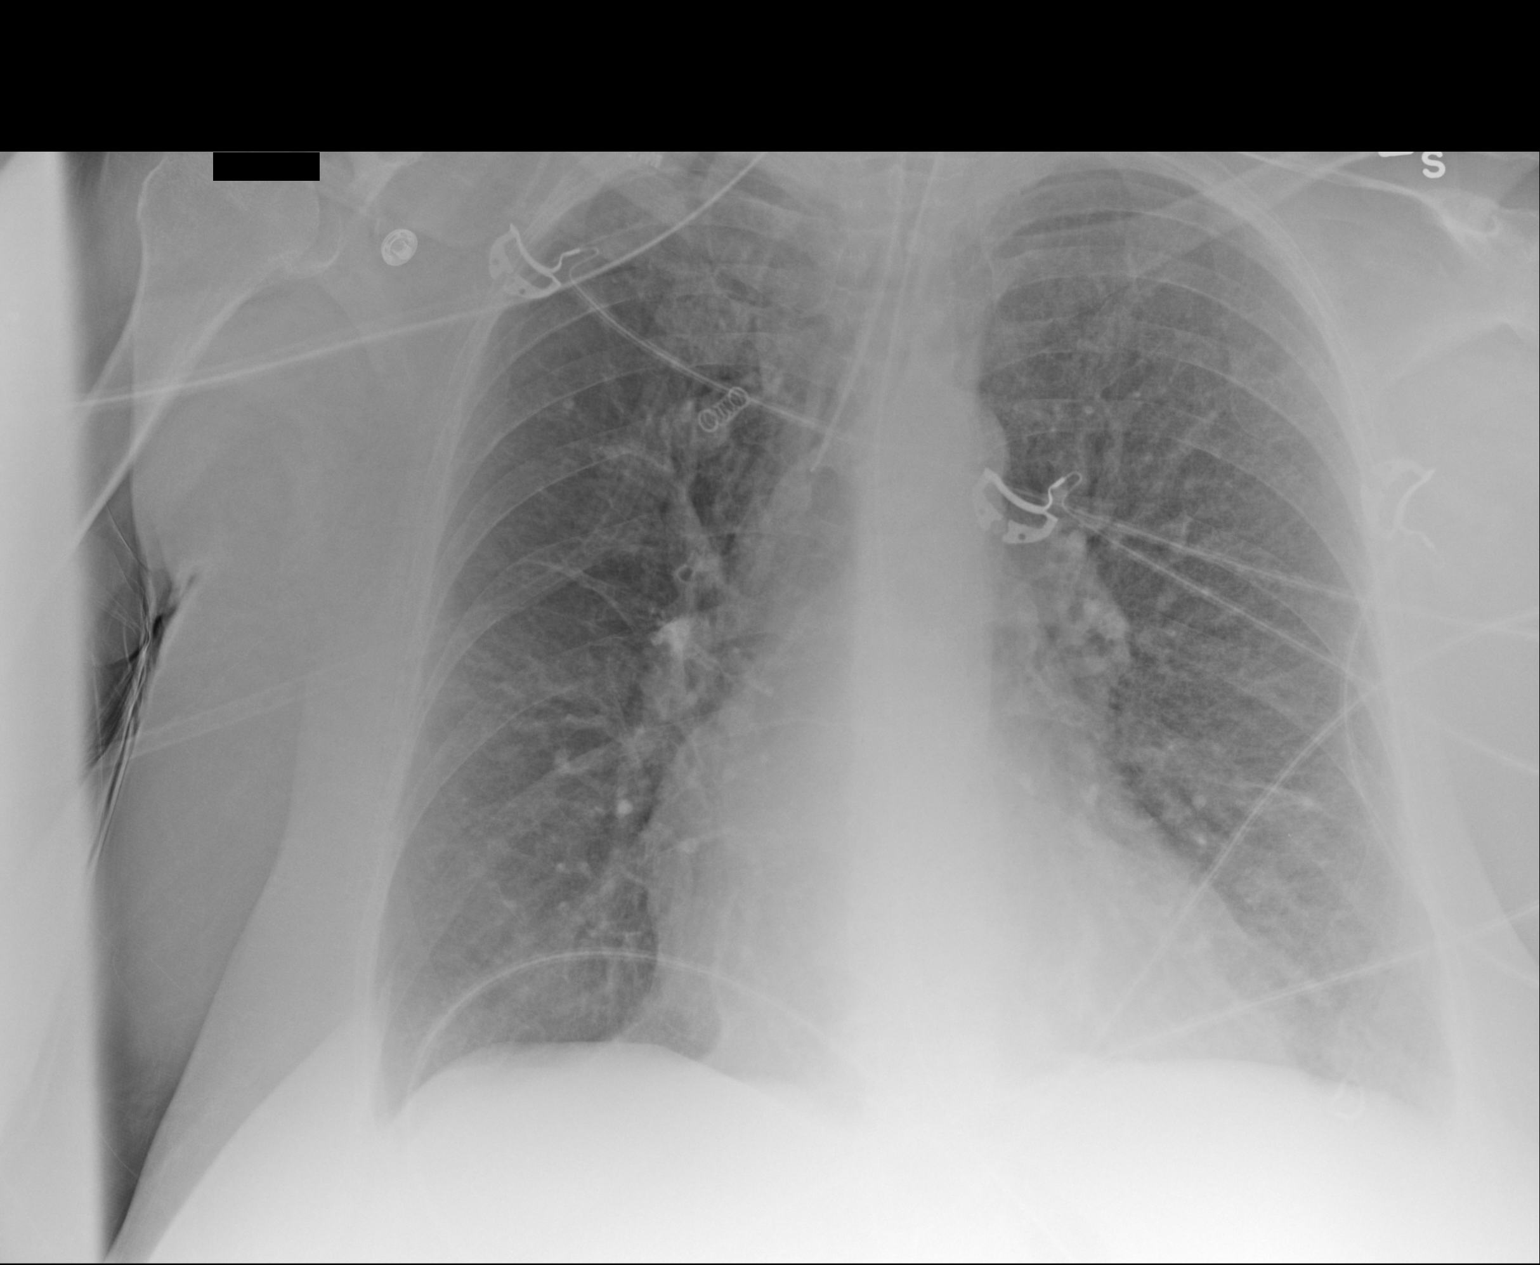

[1 of 1 positions shown; findings below may reference images not displayed]

FINDINGS: Endotracheal tube tip is 2.4 cm above the carina. Nasogastric tube
tip and side port are below the diaphragm. No pneumothorax. There is
generalized interstitial thickening. No airspace consolidation.
Heart is upper normal in size with mild pulmonary venous
hypertension. No adenopathy. No bone lesions.
IMPRESSION: Tube positions as described without pneumothorax. There is a degree
of pulmonary vascular congestion. Interstitial prominence may
reflect mild underlying pulmonary edema but also may be reflective
of chronic inflammatory type change. No airspace consolidation or
adenopathy.

## 2018-04-29 IMAGING — CR DG CHEST 1V PORT
1 series · 1 of 1 positions shown · non-contrast
Comparison: 09/01/2016 and 08/27/2016

CLINICAL DATA: Ventilator dependence.

EXAM:
PORTABLE CHEST 1 VIEW

[AP]
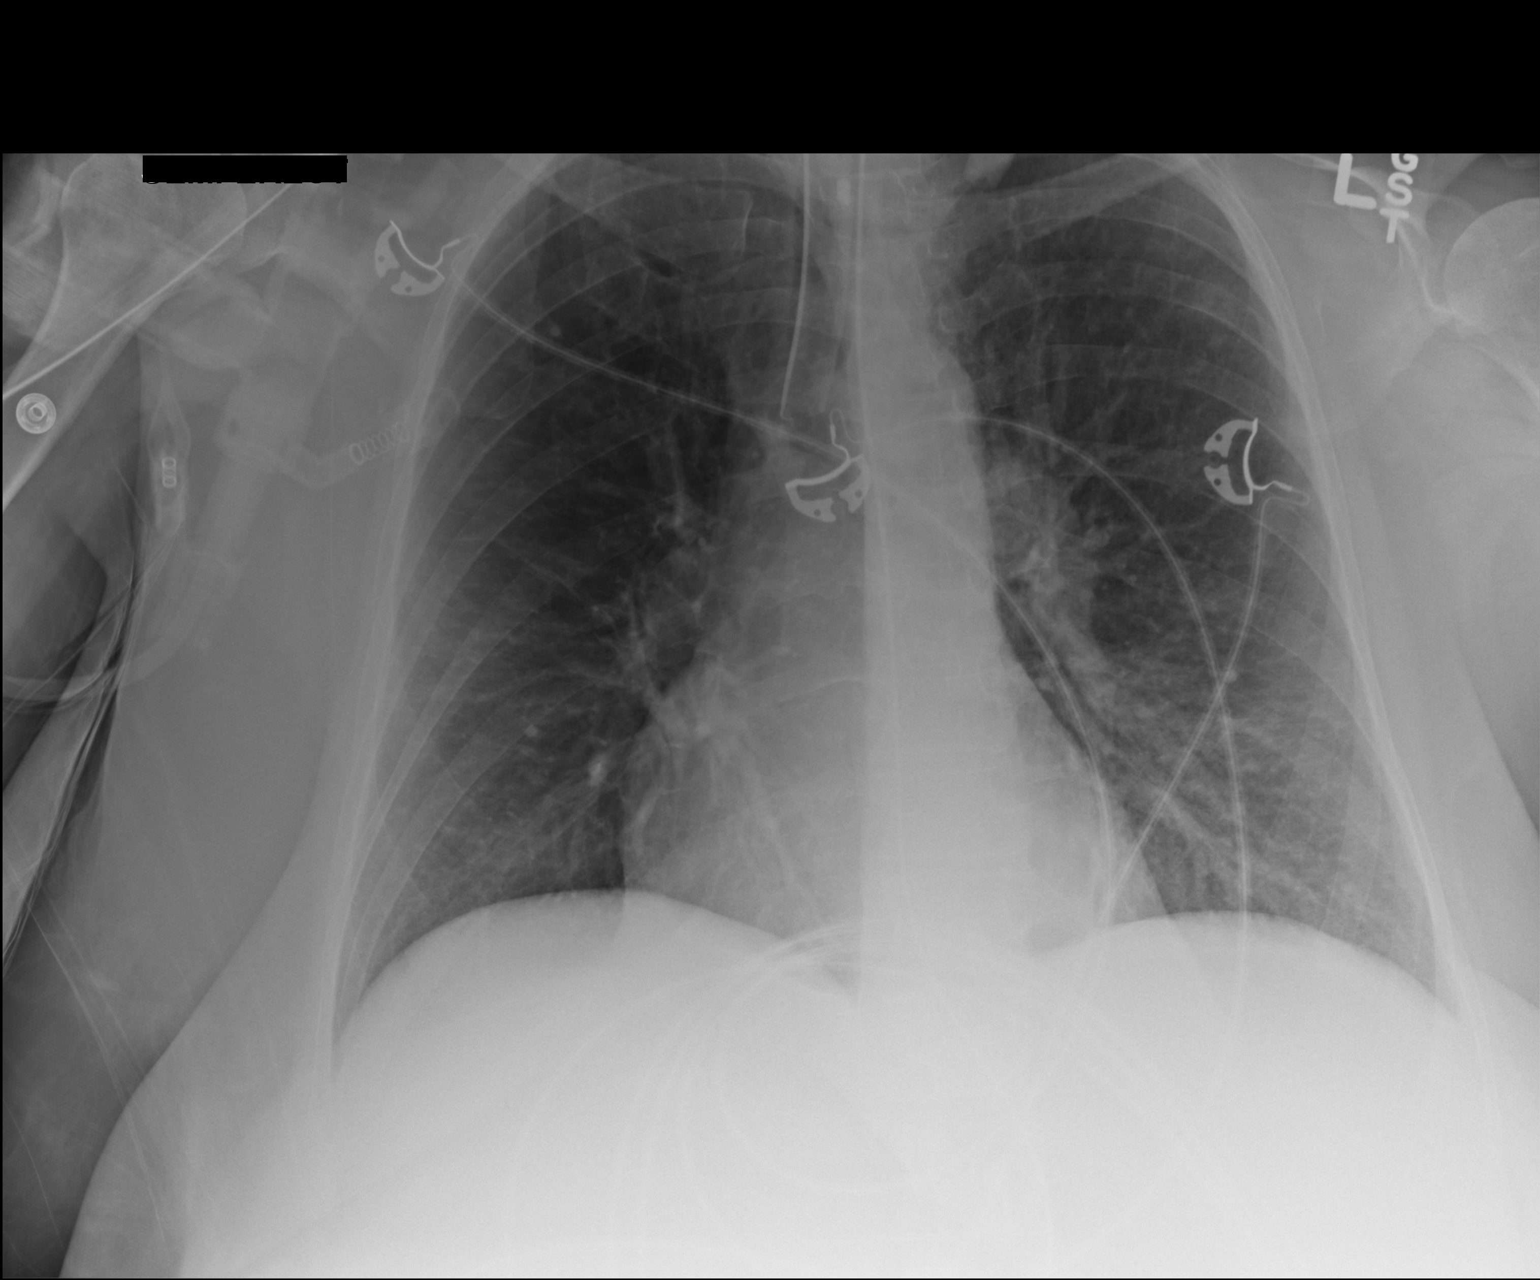

[1 of 1 positions shown; findings below may reference images not displayed]

FINDINGS: Endotracheal tube has tip 3.6 cm above the carina. Nasogastric tube
courses into the region of the stomach and off the inferior portion
of the film as tip is not visualized.

Lungs are adequately inflated mild stable prominence of the vascular
markings in the left infrahilar region. No focal lobar
consolidation, effusion or pneumothorax. Small stable calcified
granulomas right lung. Cardiomediastinal silhouette and remainder of
the exam is unchanged.
IMPRESSION: Mild stable prominence of the left infrahilar vascular markings.

Tubes and lines as described.
